# Patient Record
Sex: Male | Born: 1955 | Race: White | Hispanic: No | Marital: Married | Smoking: Never smoker
Health system: Southern US, Community
[De-identification: ages and names within clinical notes are randomized; demographics above are authoritative.]

## PROBLEM LIST (undated history)

## (undated) DIAGNOSIS — I639 Cerebral infarction, unspecified: Secondary | ICD-10-CM

## (undated) HISTORY — DX: Cerebral infarction, unspecified: I63.9

## (undated) HISTORY — PX: SKIN GRAFT: SHX250

---

## 2021-01-24 ENCOUNTER — Emergency Department (HOSPITAL_COMMUNITY): Payer: Medicare HMO | Admitting: Anesthesiology

## 2021-01-24 ENCOUNTER — Emergency Department (HOSPITAL_COMMUNITY): Payer: Medicare HMO

## 2021-01-24 ENCOUNTER — Inpatient Hospital Stay (HOSPITAL_COMMUNITY)
Admission: EM | Admit: 2021-01-24 | Discharge: 2021-02-15 | DRG: 023 | Disposition: A | Discharge status: Rehabilitation Facility | Payer: Medicare HMO | Attending: Internal Medicine | Admitting: Internal Medicine

## 2021-01-24 ENCOUNTER — Encounter (HOSPITAL_COMMUNITY)
Admission: EM | Disposition: A | Discharge status: Rehabilitation Facility | Payer: Self-pay | Source: Home / Self Care | Attending: Neurology

## 2021-01-24 DIAGNOSIS — J189 Pneumonia, unspecified organism: Secondary | ICD-10-CM | POA: Diagnosis not present

## 2021-01-24 DIAGNOSIS — R54 Age-related physical debility: Secondary | ICD-10-CM | POA: Diagnosis present

## 2021-01-24 DIAGNOSIS — Z781 Physical restraint status: Secondary | ICD-10-CM

## 2021-01-24 DIAGNOSIS — I6521 Occlusion and stenosis of right carotid artery: Secondary | ICD-10-CM | POA: Diagnosis present

## 2021-01-24 DIAGNOSIS — Y95 Nosocomial condition: Secondary | ICD-10-CM | POA: Diagnosis not present

## 2021-01-24 DIAGNOSIS — E785 Hyperlipidemia, unspecified: Secondary | ICD-10-CM | POA: Diagnosis present

## 2021-01-24 DIAGNOSIS — R410 Disorientation, unspecified: Secondary | ICD-10-CM | POA: Diagnosis not present

## 2021-01-24 DIAGNOSIS — I639 Cerebral infarction, unspecified: Secondary | ICD-10-CM | POA: Diagnosis present

## 2021-01-24 DIAGNOSIS — R1312 Dysphagia, oropharyngeal phase: Secondary | ICD-10-CM | POA: Diagnosis not present

## 2021-01-24 DIAGNOSIS — U071 COVID-19: Secondary | ICD-10-CM | POA: Diagnosis not present

## 2021-01-24 DIAGNOSIS — G8194 Hemiplegia, unspecified affecting left nondominant side: Secondary | ICD-10-CM | POA: Diagnosis present

## 2021-01-24 DIAGNOSIS — I69391 Dysphagia following cerebral infarction: Secondary | ICD-10-CM | POA: Diagnosis not present

## 2021-01-24 DIAGNOSIS — R4182 Altered mental status, unspecified: Secondary | ICD-10-CM | POA: Diagnosis not present

## 2021-01-24 DIAGNOSIS — J1282 Pneumonia due to coronavirus disease 2019: Secondary | ICD-10-CM | POA: Diagnosis not present

## 2021-01-24 DIAGNOSIS — E870 Hyperosmolality and hypernatremia: Secondary | ICD-10-CM | POA: Diagnosis not present

## 2021-01-24 DIAGNOSIS — D649 Anemia, unspecified: Secondary | ICD-10-CM | POA: Diagnosis not present

## 2021-01-24 DIAGNOSIS — R29718 NIHSS score 18: Secondary | ICD-10-CM | POA: Diagnosis present

## 2021-01-24 DIAGNOSIS — R0600 Dyspnea, unspecified: Secondary | ICD-10-CM

## 2021-01-24 DIAGNOSIS — D6489 Other specified anemias: Secondary | ICD-10-CM | POA: Diagnosis not present

## 2021-01-24 DIAGNOSIS — I517 Cardiomegaly: Secondary | ICD-10-CM | POA: Diagnosis not present

## 2021-01-24 DIAGNOSIS — R4701 Aphasia: Secondary | ICD-10-CM | POA: Diagnosis present

## 2021-01-24 DIAGNOSIS — R0602 Shortness of breath: Secondary | ICD-10-CM | POA: Diagnosis not present

## 2021-01-24 DIAGNOSIS — G936 Cerebral edema: Secondary | ICD-10-CM | POA: Diagnosis present

## 2021-01-24 DIAGNOSIS — D62 Acute posthemorrhagic anemia: Secondary | ICD-10-CM | POA: Diagnosis not present

## 2021-01-24 DIAGNOSIS — I629 Nontraumatic intracranial hemorrhage, unspecified: Secondary | ICD-10-CM | POA: Diagnosis not present

## 2021-01-24 DIAGNOSIS — R29810 Facial weakness: Secondary | ICD-10-CM | POA: Diagnosis present

## 2021-01-24 DIAGNOSIS — K117 Disturbances of salivary secretion: Secondary | ICD-10-CM | POA: Diagnosis not present

## 2021-01-24 DIAGNOSIS — I618 Other nontraumatic intracerebral hemorrhage: Secondary | ICD-10-CM | POA: Diagnosis not present

## 2021-01-24 DIAGNOSIS — J690 Pneumonitis due to inhalation of food and vomit: Secondary | ICD-10-CM | POA: Diagnosis not present

## 2021-01-24 DIAGNOSIS — J9601 Acute respiratory failure with hypoxia: Secondary | ICD-10-CM | POA: Diagnosis not present

## 2021-01-24 DIAGNOSIS — R414 Neurologic neglect syndrome: Secondary | ICD-10-CM | POA: Diagnosis present

## 2021-01-24 DIAGNOSIS — D61818 Other pancytopenia: Secondary | ICD-10-CM | POA: Diagnosis not present

## 2021-01-24 DIAGNOSIS — I6601 Occlusion and stenosis of right middle cerebral artery: Secondary | ICD-10-CM | POA: Diagnosis present

## 2021-01-24 DIAGNOSIS — R739 Hyperglycemia, unspecified: Secondary | ICD-10-CM | POA: Diagnosis not present

## 2021-01-24 DIAGNOSIS — G9341 Metabolic encephalopathy: Secondary | ICD-10-CM | POA: Diagnosis present

## 2021-01-24 DIAGNOSIS — D72829 Elevated white blood cell count, unspecified: Secondary | ICD-10-CM | POA: Diagnosis not present

## 2021-01-24 DIAGNOSIS — I619 Nontraumatic intracerebral hemorrhage, unspecified: Secondary | ICD-10-CM | POA: Diagnosis present

## 2021-01-24 DIAGNOSIS — J3489 Other specified disorders of nose and nasal sinuses: Secondary | ICD-10-CM | POA: Diagnosis not present

## 2021-01-24 DIAGNOSIS — I6389 Other cerebral infarction: Secondary | ICD-10-CM | POA: Diagnosis not present

## 2021-01-24 DIAGNOSIS — I6300 Cerebral infarction due to thrombosis of unspecified precerebral artery: Secondary | ICD-10-CM | POA: Diagnosis not present

## 2021-01-24 DIAGNOSIS — R404 Transient alteration of awareness: Secondary | ICD-10-CM | POA: Diagnosis not present

## 2021-01-24 DIAGNOSIS — J988 Other specified respiratory disorders: Secondary | ICD-10-CM | POA: Diagnosis not present

## 2021-01-24 DIAGNOSIS — E871 Hypo-osmolality and hyponatremia: Secondary | ICD-10-CM | POA: Diagnosis not present

## 2021-01-24 DIAGNOSIS — I63411 Cerebral infarction due to embolism of right middle cerebral artery: Secondary | ICD-10-CM | POA: Diagnosis present

## 2021-01-24 DIAGNOSIS — R001 Bradycardia, unspecified: Secondary | ICD-10-CM | POA: Diagnosis not present

## 2021-01-24 DIAGNOSIS — I959 Hypotension, unspecified: Secondary | ICD-10-CM | POA: Diagnosis not present

## 2021-01-24 DIAGNOSIS — I69392 Facial weakness following cerebral infarction: Secondary | ICD-10-CM | POA: Diagnosis not present

## 2021-01-24 DIAGNOSIS — R0682 Tachypnea, not elsewhere classified: Secondary | ICD-10-CM

## 2021-01-24 DIAGNOSIS — R4020 Unspecified coma: Secondary | ICD-10-CM | POA: Diagnosis not present

## 2021-01-24 DIAGNOSIS — Z28310 Unvaccinated for covid-19: Secondary | ICD-10-CM | POA: Diagnosis not present

## 2021-01-24 DIAGNOSIS — Z79899 Other long term (current) drug therapy: Secondary | ICD-10-CM

## 2021-01-24 DIAGNOSIS — R4781 Slurred speech: Secondary | ICD-10-CM | POA: Diagnosis not present

## 2021-01-24 DIAGNOSIS — Z9289 Personal history of other medical treatment: Secondary | ICD-10-CM

## 2021-01-24 DIAGNOSIS — I63031 Cerebral infarction due to thrombosis of right carotid artery: Secondary | ICD-10-CM | POA: Diagnosis not present

## 2021-01-24 DIAGNOSIS — I63231 Cerebral infarction due to unspecified occlusion or stenosis of right carotid arteries: Secondary | ICD-10-CM | POA: Diagnosis not present

## 2021-01-24 DIAGNOSIS — R7303 Prediabetes: Secondary | ICD-10-CM | POA: Diagnosis present

## 2021-01-24 DIAGNOSIS — J323 Chronic sphenoidal sinusitis: Secondary | ICD-10-CM | POA: Diagnosis not present

## 2021-01-24 DIAGNOSIS — Z4682 Encounter for fitting and adjustment of non-vascular catheter: Secondary | ICD-10-CM | POA: Diagnosis not present

## 2021-01-24 DIAGNOSIS — I161 Hypertensive emergency: Secondary | ICD-10-CM | POA: Diagnosis present

## 2021-01-24 DIAGNOSIS — J9811 Atelectasis: Secondary | ICD-10-CM | POA: Diagnosis not present

## 2021-01-24 DIAGNOSIS — I10 Essential (primary) hypertension: Secondary | ICD-10-CM | POA: Diagnosis present

## 2021-01-24 DIAGNOSIS — Z9889 Other specified postprocedural states: Secondary | ICD-10-CM | POA: Diagnosis not present

## 2021-01-24 DIAGNOSIS — R509 Fever, unspecified: Secondary | ICD-10-CM | POA: Diagnosis not present

## 2021-01-24 DIAGNOSIS — I69354 Hemiplegia and hemiparesis following cerebral infarction affecting left non-dominant side: Secondary | ICD-10-CM | POA: Diagnosis not present

## 2021-01-24 DIAGNOSIS — I63511 Cerebral infarction due to unspecified occlusion or stenosis of right middle cerebral artery: Secondary | ICD-10-CM | POA: Diagnosis not present

## 2021-01-24 DIAGNOSIS — R339 Retention of urine, unspecified: Secondary | ICD-10-CM | POA: Diagnosis not present

## 2021-01-24 DIAGNOSIS — I69322 Dysarthria following cerebral infarction: Secondary | ICD-10-CM | POA: Diagnosis not present

## 2021-01-24 DIAGNOSIS — Z8616 Personal history of COVID-19: Secondary | ICD-10-CM | POA: Diagnosis not present

## 2021-01-24 DIAGNOSIS — H518 Other specified disorders of binocular movement: Secondary | ICD-10-CM | POA: Diagnosis present

## 2021-01-24 DIAGNOSIS — E876 Hypokalemia: Secondary | ICD-10-CM | POA: Diagnosis not present

## 2021-01-24 DIAGNOSIS — R0689 Other abnormalities of breathing: Secondary | ICD-10-CM | POA: Diagnosis not present

## 2021-01-24 DIAGNOSIS — Z4659 Encounter for fitting and adjustment of other gastrointestinal appliance and device: Secondary | ICD-10-CM

## 2021-01-24 DIAGNOSIS — R220 Localized swelling, mass and lump, head: Secondary | ICD-10-CM | POA: Diagnosis not present

## 2021-01-24 DIAGNOSIS — R4189 Other symptoms and signs involving cognitive functions and awareness: Secondary | ICD-10-CM | POA: Diagnosis present

## 2021-01-24 DIAGNOSIS — R471 Dysarthria and anarthria: Secondary | ICD-10-CM | POA: Diagnosis present

## 2021-01-24 HISTORY — PX: RADIOLOGY WITH ANESTHESIA: SHX6223

## 2021-01-24 LAB — CBC
HCT: 50.2 % (ref 39.0–52.0)
Hemoglobin: 16.9 g/dL (ref 13.0–17.0)
MCH: 30.2 pg (ref 26.0–34.0)
MCHC: 33.7 g/dL (ref 30.0–36.0)
MCV: 89.8 fL (ref 80.0–100.0)
Platelets: 251 10*3/uL (ref 150–400)
RBC: 5.59 MIL/uL (ref 4.22–5.81)
RDW: 12.3 % (ref 11.5–15.5)
WBC: 9.7 10*3/uL (ref 4.0–10.5)
nRBC: 0 % (ref 0.0–0.2)

## 2021-01-24 LAB — COMPREHENSIVE METABOLIC PANEL
ALT: 31 U/L (ref 0–44)
AST: 27 U/L (ref 15–41)
Albumin: 4.6 g/dL (ref 3.5–5.0)
Alkaline Phosphatase: 79 U/L (ref 38–126)
Anion gap: 13 (ref 5–15)
BUN: 9 mg/dL (ref 8–23)
CO2: 23 mmol/L (ref 22–32)
Calcium: 9.1 mg/dL (ref 8.9–10.3)
Chloride: 95 mmol/L — ABNORMAL LOW (ref 98–111)
Creatinine, Ser: 0.74 mg/dL (ref 0.61–1.24)
GFR, Estimated: >60 mL/min (ref >60)
Glucose, Bld: 103 mg/dL — ABNORMAL HIGH (ref 70–99)
Potassium: 4 mmol/L (ref 3.5–5.1)
Sodium: 131 mmol/L — ABNORMAL LOW (ref 135–145)
Total Bilirubin: 1.1 mg/dL (ref 0.3–1.2)
Total Protein: 7.8 g/dL (ref 6.5–8.1)

## 2021-01-24 LAB — DIFFERENTIAL
Abs Immature Granulocytes: 0.03 10*3/uL (ref 0.00–0.07)
Basophils Absolute: 0 10*3/uL (ref 0.0–0.1)
Basophils Relative: 0 %
Eosinophils Absolute: 0.1 10*3/uL (ref 0.0–0.5)
Eosinophils Relative: 1 %
Immature Granulocytes: 0 %
Lymphocytes Relative: 13 %
Lymphs Abs: 1.3 10*3/uL (ref 0.7–4.0)
Monocytes Absolute: 0.8 10*3/uL (ref 0.1–1.0)
Monocytes Relative: 8 %
Neutro Abs: 7.5 10*3/uL (ref 1.7–7.7)
Neutrophils Relative %: 78 %

## 2021-01-24 LAB — I-STAT CHEM 8, ED
BUN: 11 mg/dL (ref 8–23)
Calcium, Ion: 0.89 mmol/L — CL (ref 1.15–1.40)
Chloride: 100 mmol/L (ref 98–111)
Creatinine, Ser: 0.7 mg/dL (ref 0.61–1.24)
Glucose, Bld: 101 mg/dL — ABNORMAL HIGH (ref 70–99)
HCT: 49 % (ref 39.0–52.0)
Hemoglobin: 16.7 g/dL (ref 13.0–17.0)
Potassium: 5 mmol/L (ref 3.5–5.1)
Sodium: 132 mmol/L — ABNORMAL LOW (ref 135–145)
TCO2: 26 mmol/L (ref 22–32)

## 2021-01-24 LAB — CBG MONITORING, ED: Glucose-Capillary: 71 mg/dL (ref 70–99)

## 2021-01-24 LAB — APTT: aPTT: 32 seconds (ref 24–36)

## 2021-01-24 LAB — PROTIME-INR
INR: 1 (ref 0.8–1.2)
Prothrombin Time: 13.1 seconds (ref 11.4–15.2)

## 2021-01-24 SURGERY — IR WITH ANESTHESIA
Anesthesia: General

## 2021-01-24 MED ORDER — CEFAZOLIN SODIUM-DEXTROSE 2-4 GM/100ML-% IV SOLN
INTRAVENOUS | Status: AC
  Filled 2021-01-24: qty 100

## 2021-01-24 MED ORDER — CANGRELOR TETRASODIUM 50 MG IV SOLR
INTRAVENOUS | Status: AC
  Filled 2021-01-24: qty 50

## 2021-01-24 MED ORDER — FENTANYL CITRATE (PF) 250 MCG/5ML IJ SOLN
INTRAMUSCULAR | Status: AC
  Administered 2021-01-25: 25 ug via INTRAVENOUS
  Filled 2021-01-24: qty 5

## 2021-01-24 MED ORDER — ALTEPLASE (STROKE) FULL DOSE INFUSION
0.9000 mg/kg | Freq: Once | INTRAVENOUS | Status: AC
  Administered 2021-01-24: 66.9 mg via INTRAVENOUS
  Filled 2021-01-24: qty 100

## 2021-01-24 MED ORDER — LABETALOL HCL 5 MG/ML IV SOLN
10.0000 mg | Freq: Once | INTRAVENOUS | Status: AC
  Administered 2021-01-24: 10 mg via INTRAVENOUS

## 2021-01-24 MED ORDER — CLEVIDIPINE BUTYRATE 0.5 MG/ML IV EMUL
INTRAVENOUS | Status: AC
  Filled 2021-01-24: qty 50

## 2021-01-24 MED ORDER — NITROGLYCERIN 1 MG/10 ML FOR IR/CATH LAB
INTRA_ARTERIAL | Status: AC
  Administered 2021-01-25: 50 ug via INTRA_ARTERIAL
  Filled 2021-01-24: qty 10

## 2021-01-24 MED ORDER — IOHEXOL 350 MG/ML SOLN
100.0000 mL | Freq: Once | INTRAVENOUS | Status: AC | PRN
  Administered 2021-01-24: 100 mL via INTRAVENOUS

## 2021-01-24 MED ORDER — TICAGRELOR 90 MG PO TABS
ORAL_TABLET | ORAL | Status: AC
  Administered 2021-01-25: 180 mg via OROGASTRIC
  Filled 2021-01-24: qty 2

## 2021-01-24 MED ORDER — SODIUM CHLORIDE 0.9 % IV SOLN: 50.0000 mL | Freq: Once | INTRAVENOUS | Status: DC

## 2021-01-24 MED ORDER — CLOPIDOGREL BISULFATE 300 MG PO TABS
ORAL_TABLET | ORAL | Status: AC
  Filled 2021-01-24: qty 1

## 2021-01-24 MED ORDER — VERAPAMIL HCL 2.5 MG/ML IV SOLN
INTRAVENOUS | Status: AC
  Filled 2021-01-24: qty 2

## 2021-01-24 MED ORDER — ASPIRIN 81 MG PO CHEW
CHEWABLE_TABLET | ORAL | Status: AC
  Administered 2021-01-25: 81 mg via OROGASTRIC
  Filled 2021-01-24: qty 1

## 2021-01-24 MED ORDER — EPTIFIBATIDE 20 MG/10ML IV SOLN
INTRAVENOUS | Status: AC
  Administered 2021-01-25: 2 mg via INTRA_ARTERIAL
  Administered 2021-01-25: 4 mg via INTRA_ARTERIAL
  Filled 2021-01-24: qty 10

## 2021-01-24 MED ORDER — TIROFIBAN HCL IN NACL 5-0.9 MG/100ML-% IV SOLN
INTRAVENOUS | Status: AC
  Filled 2021-01-24: qty 100

## 2021-01-24 MED ORDER — CLEVIDIPINE BUTYRATE 0.5 MG/ML IV EMUL
0.0000 mg/h | INTRAVENOUS | Status: DC
  Administered 2021-01-24: 4 mg/h via INTRAVENOUS
  Administered 2021-01-25: 30 mg/h via INTRAVENOUS
  Administered 2021-01-25: 12 mg/h via INTRAVENOUS
  Administered 2021-01-25: 23 mg/h via INTRAVENOUS
  Administered 2021-01-25 (×2): 30 mg/h via INTRAVENOUS
  Administered 2021-01-26: 4 mg/h via INTRAVENOUS
  Filled 2021-01-24 (×7): qty 100

## 2021-01-24 MED ORDER — SODIUM CHLORIDE 0.9% FLUSH: 3.0000 mL | Freq: Once | INTRAVENOUS | Status: DC

## 2021-01-24 MED ORDER — IOHEXOL 240 MG/ML SOLN
INTRAMUSCULAR | Status: AC
  Filled 2021-01-24: qty 200

## 2021-01-24 NOTE — Progress Notes (Signed)
PHARMACIST CODE STROKE RESPONSE  Notified to mix tPA at 1047 by Dr. Otelia Limes Delivered tPA to RN at 1050  tPA dose = 7.4mg  bolus over 1 minute followed by 66.9mg  for a total dose of 74.3mg  over 1 hour  Issues/delays encountered (if applicable): BP management, required multiple labetalol doses and cleviprex gtt   Daylene Posey 01/24/21 11:07 PM

## 2021-01-24 NOTE — Code Documentation (Signed)
Stroke Response Nurse Documentation Code Documentation  Leonard [name] Van is a 65 y.o. male arriving to [facility] ED via [place] [facility] on 4/20 with no past medical hx. Code stroke was activated by EMS. Patient from home where he was LKW at 1830 and now complaining of left hemiplegia and left facial droop . On No antithrombotic. Stroke team at the bedside on patient arrival. Labs drawn and patient cleared for CT by Dr. Silverio Lay. Patient to CT with team. NIHSS 18, see documentation for details and code stroke times. Patient with right gaze preference , left hemianopia, left facial droop, left arm weakness, left leg weakness, left decreased sensation, dysarthria  and Sensory  neglect on exam. The following imaging was completed:  CTA head and neck and CTP.  Patient is a candidate for tPA due to fixed neurological deficit. Pt was taken to IR for revasularization. Bedside handoff with ED RN Renea Ee.    Rose Fillers  Rapid Response RN

## 2021-01-24 NOTE — ED Notes (Signed)
Transported to IR 

## 2021-01-24 NOTE — ED Notes (Signed)
Transported to CT 

## 2021-01-24 NOTE — Anesthesia Preprocedure Evaluation (Addendum)
Anesthesia Evaluation  Patient identified by MRN, date of birth, ID band Patient confused    Reviewed: Allergy & Precautions, Patient's Chart, lab work & pertinent test results, Unable to perform ROS - Chart review onlyPreop documentation limited or incomplete due to emergent nature of procedure.  Airway Mallampati: III       Dental no notable dental hx.    Pulmonary    Pulmonary exam normal        Cardiovascular Normal cardiovascular exam     Neuro/Psych CVA    GI/Hepatic negative GI ROS,   Endo/Other    Renal/GU      Musculoskeletal   Abdominal   Peds  Hematology   Anesthesia Other Findings   Reproductive/Obstetrics                           Anesthesia Physical Anesthesia Plan  ASA: III and emergent  Anesthesia Plan: General   Post-op Pain Management:    Induction: Intravenous, Rapid sequence and Cricoid pressure planned  PONV Risk Score and Plan: 2 and Ondansetron and Treatment Gonyer vary due to age or medical condition  Airway Management Planned: Oral ETT  Additional Equipment:   Intra-op Plan:   Post-operative Plan: Possible Post-op intubation/ventilation  Informed Consent: I have reviewed the patients History and Physical, chart, labs and discussed the procedure including the risks, benefits and alternatives for the proposed anesthesia with the patient or authorized representative who has indicated his/her understanding and acceptance.       Plan Discussed with: Anesthesiologist, CRNA and Surgeon  Anesthesia Plan Comments:        Anesthesia Quick Evaluation

## 2021-01-24 NOTE — H&P (Signed)
Admission H&P    Chief Complaint: Acute onset of left hemiplegia, left facial droop, dysarthria and left hemineglect  HPI: Leonard [name] is an 65 y.o. male with no PMHx who presents via EMS as a Code Stroke for acute onset of left hemiplegia, left facial droop, dysarthria and left hemineglect. He was last seen normal by family at 6:30 PM when he went to go sit on a bench at a pond on his property. When family went to check on him at about 9:30 PM, he was slumped over to his left side, weak on the left, with left facial droop and dysarthria. EMS was called and on arrival they noted the same. Code Stroke was called en route. BP per EMS was 232/124. STAT CT head in the ED shows no hemorrhage, but with subtle hypodensity in portions of the right MCA territory as well as a dense right MCA sign. The patient is unaware of his deficits but knows that he is in the hospital.   CT head: 1. 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 2. Hyperdense appearance of the right MCA. 3. ASPECTS is 7.  LSN: 6:30 PM tPA Given: Yes NIHSS: 18 mRS: 0  PMHx Per family communication with EMS, the patient has no significant PMHx  No family history on file.  Social History:  has no history on file for tobacco use, alcohol use, and drug use.  Allergies: Not on File  Medications: No home medications listed in Epic  ROS: The patient has no complaints, including no headache. He is unaware of his left sided motor deficit.   Physical Examination: Weight 82.6 kg.  HEENT-  [place]/AT Lungs - Respirations unlabored Extremities - Warm and well perfused  Neurologic Examination: Mental Status: Awake with decreased level of alertness. Speech is mildly dysarthric but fluent, with intact comprehension for right sided commands. Naming intact. He is oriented to the day of the week, the month and the state. He knows that he is from [place] but does not know the current city. He states that it is 2021.  Anosognosia and left hemineglect are noted.  Cranial Nerves: II:  Left homonymous hemianopsia. PERRL.  III,IV, VI: No ptosis. Right gaze deviation. Cannot cross midline to the left. No nystagmus.   V,VII: Left facial droop. Insensate to FT on the left.  VIII: Hearing intact to voice IX,X: Mild pharyngeal dysarthria XI: Head rotated to the right XII: Tongue deviates to the left.  Motor: RUE and RLE 5/5 LUE 0/5 LLE 3/5 Sensory: Insensate to LUE and LLE Deep Tendon Reflexes:  2+ bilateral brachioradialis.  3+ patellar reflexes bilaterally.  Plantars: Mute bilaterally  Cerebellar: No ataxia with right FNF. Unable to perform with LUE.  Gait: Unable to assess.   Results for orders placed or performed during the hospital encounter of 01/24/21 (from the past 48 hour(s))  CBC     Status: None   Collection Time: 01/24/21 10:35 PM  Result Value Ref Range   WBC 9.7 4.0 - 10.5 K/uL   RBC 5.59 4.22 - 5.81 MIL/uL   Hemoglobin 16.9 13.0 - 17.0 g/dL   HCT 38.7 56.4 - 33.2 %   MCV 89.8 80.0 - 100.0 fL   MCH 30.2 26.0 - 34.0 pg   MCHC 33.7 30.0 - 36.0 g/dL   RDW 95.1 88.4 - 16.6 %   Platelets 251 150 - 400 K/uL   nRBC 0.0 0.0 - 0.2 %    Comment: Performed at [facility]  [facility], [address] [place], [place] [address]  Differential     Status: None   Collection Time: 01/24/21 10:35 PM  Result Value Ref Range   Neutrophils Relative % 78 %   Neutro Abs 7.5 1.7 - 7.7 K/uL   Lymphocytes Relative 13 %   Lymphs Abs 1.3 0.7 - 4.0 K/uL   Monocytes Relative 8 %   Monocytes Absolute 0.8 0.1 - 1.0 K/uL   Eosinophils Relative 1 %   Eosinophils Absolute 0.1 0.0 - 0.5 K/uL   Basophils Relative 0 %   Basophils Absolute 0.0 0.0 - 0.1 K/uL   Immature Granulocytes 0 %   Abs Immature Granulocytes 0.03 0.00 - 0.07 K/uL    Comment: Performed at [facility], [address], [place], [place] [address]  CBG monitoring, ED     Status: None   Collection Time: 01/24/21 10:37 PM  Result Value Ref  Range   Glucose-Capillary 71 70 - 99 mg/dL    Comment: Glucose reference range applies only to samples taken after fasting for at least 8 hours.  I-stat chem 8, ED     Status: Abnormal   Collection Time: 01/24/21 10:45 PM  Result Value Ref Range   Sodium 132 (L) 135 - 145 mmol/L   Potassium 5.0 3.5 - 5.1 mmol/L   Chloride 100 98 - 111 mmol/L   BUN 11 8 - 23 mg/dL   Creatinine, Ser 7.78 0.61 - 1.24 mg/dL   Glucose, Bld 242 (H) 70 - 99 mg/dL    Comment: Glucose reference range applies only to samples taken after fasting for at least 8 hours.   Calcium, Ion 0.89 (LL) 1.15 - 1.40 mmol/L   TCO2 26 22 - 32 mmol/L   Hemoglobin 16.7 13.0 - 17.0 g/dL   HCT 35.3 61.4 - 43.1 %   Comment NOTIFIED PHYSICIAN    No results found.  Assessment: 65 y.o. male presenting with acute onset of left hemiplegia, left facial droop, dysarthria and left hemineglect 1. 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 Hyperdense appearance of the right MCA. ASPECTS is 7. 3. 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 68 mL right MCA territory core infarct with 133 mL area of ischemic penumbra. 4. Stroke Risk Factors - None based on no PMHx 5. After comprehensive review of possible contraindications, he has no absolute contraindications to tPA administration. 6. The patient is a tPA candidate. Discussed extensively the risks/benefits of tPA treatment vs. no treatment with the patient's wife, including risks of hemorrhage and death with tPA administration versus worse overall outcomes on average in patients within tPA time window who are not administered tPA. The patient's anosognosia precludes meaningful medical decision making on his  part at this time. Overall benefits of tPA regarding long-term prognosis are felt to outweigh risks. The patient's wife expressed understanding and wish to proceed with tPA 7. The patient is a VIR candidate. Risks/benefits of the procedure were discussed extensively with the patient's wife, including approximately 50% chance of significant improvement relative to an approximate 10% chance of subarachnoid hemorrhage with possibility of significant worsening including death. The patient's wife expressed understanding and provided informed consent to proceed with VIR.  Plan: 1. Admitting to Neuro ICU.  2. Post-tPA order set to include frequent neuro checks and BP management.  3. No antiplatelet medications or anticoagulants for at least 24 hours following tPA.  4. DVT prophylaxis with SCDs.  5. Will need to be started on a statin.  6. Will need to start antiplatelet therapy if follow up CT at 24 hours is negative for hemorrhagic conversion. 7. TTE.  8. MRI brain 9. Cardiac telemetry  10. PT/OT/Speech.  11. NPO until passes swallow evaluation.  12. Fasting lipid panel, HgbA1c   45 minutes spent in the emergent neurological evaluation and management of this critically ill patient with acute stroke.   Electronically signed: Dr. Caryl Pina 01/24/2021, 10:54 PM

## 2021-01-24 NOTE — ED Provider Notes (Signed)
[facility] EMERGENCY DEPARTMENT Provider Note   CSN: [id number] Arrival date & time: 01/24/21  2233     History No chief complaint on file.   Adger Cantera Lagos is a 65 y.o. male here presenting with weakness.  Patient's last normal was 6:30 PM.  He was with family and told his wife that he needs to go down to the pond because he was afraid that the beaver was building a dam so he wants to go to shoot the beaver.  Wife went to check up on him around 10:45 PM.  However he appears to be slumped over and she noticed a left-sided weakness and left facial droop.  EMS was called and code stroke was activated.  Per family, patient has no medical problems but has not seen doctors for years.   The history is provided by the patient.       No past medical history on file.  There are no problems to display for this patient.  No family history on file.     Home Medications Prior to Admission medications   Not on File    Allergies    Patient has no allergy information on record.  Review of Systems   Review of Systems  Neurological: Positive for weakness.  All other systems reviewed and are negative.   Physical Exam Updated Vital Signs Wt 82.6 kg   Physical Exam Vitals and nursing note reviewed.  Constitutional:      Appearance: Normal appearance.  HENT:     Head: Normocephalic.     Nose: Nose normal.     Mouth/Throat:     Mouth: Mucous membranes are moist.  Eyes:     Extraocular Movements: Extraocular movements intact.     Pupils: Pupils are equal, round, and reactive to light.  Cardiovascular:     Rate and Rhythm: Normal rate and regular rhythm.     Pulses: Normal pulses.     Heart sounds: Normal heart sounds.  Pulmonary:     Effort: Pulmonary effort is normal.     Breath sounds: Normal breath sounds.  Abdominal:     General: Abdomen is flat.     Palpations: Abdomen is soft.  Musculoskeletal:        General: Normal range of motion.     Cervical  back: Normal range of motion and neck supple.  Skin:    General: Skin is warm.     Capillary Refill: Capillary refill takes less than 2 seconds.  Neurological:     Mental Status: He is alert.     Comments: + L facial droop. Strength 4/5 L arm and leg. R gaze preference   Psychiatric:        Mood and Affect: Mood normal.     ED Results / Procedures / Treatments   Labs (all labs ordered are listed, but only abnormal results are displayed) Labs Reviewed  I-STAT CHEM 8, ED - Abnormal; Notable for the following components:      Result Value   Sodium 132 (*)    Glucose, Bld 101 (*)    Calcium, Ion 0.89 (*)    All other components within normal limits  RESP PANEL BY RT-PCR (FLU A&B, COVID) ARPGX2  PROTIME-INR  APTT  CBC  DIFFERENTIAL  COMPREHENSIVE METABOLIC PANEL  CBG MONITORING, ED    EKG None  Radiology CT HEAD CODE STROKE WO CONTRAST  Result Date: 01/24/2021 CLINICAL DATA:  Code stroke.  Slurred speech and left-sided deficits EXAM:  CT HEAD WITHOUT CONTRAST TECHNIQUE: Contiguous axial images were obtained from the base of the skull through the vertex without intravenous contrast. COMPARISON:  None. FINDINGS: Brain: There is no mass, hemorrhage or extra-axial collection. The size and configuration of the ventricles and extra-axial CSF spaces are normal. There is loss of gray-white differentiation within the right insula and mid MCA territory. Vascular: Hyperdense appearance of the right MCA. Skull: The visualized skull base, calvarium and extracranial soft tissues are normal. Sinuses/Orbits: No fluid levels or advanced mucosal thickening of the visualized paranasal sinuses. No mastoid or middle ear effusion. The orbits are normal. ASPECTS [facility] Stroke Program Early CT Score) - Ganglionic level infarction (caudate, lentiform nuclei, internal capsule, insula, M1-M3 cortex): 5 - Supraganglionic infarction (M4-M6 cortex): 2 Total score (0-10 with 10 being normal): 7 IMPRESSION: 1.  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 2. Hyperdense appearance of the right MCA. 3. ASPECTS is 7. These results were called by telephone at the time of interpretation on 01/24/2021 at 10:58 pm to provider Caryl Pina, who verbally acknowledged these results. Electronically Signed   By: Deatra Robinson M.D.   On: 01/24/2021 22:58    Procedures Procedures   CRITICAL CARE Performed by: Richardean Canal   Total critical care time: 30  minutes  Critical care time was exclusive of separately billable procedures and treating other patients.  Critical care was necessary to treat or prevent imminent or life-threatening deterioration.  Critical care was time spent personally by me on the following activities: development of treatment plan with patient and/or surrogate as well as nursing, discussions with consultants, evaluation of patient's response to treatment, examination of patient, obtaining history from patient or surrogate, ordering and performing treatments and interventions, ordering and review of laboratory studies, ordering and review of radiographic studies, pulse oximetry and re-evaluation of patient's condition.   Medications Ordered in ED Medications  sodium chloride flush (NS) 0.9 % injection 3 mL (has no administration in time range)  alteplase (ACTIVASE) 1 mg/mL infusion 74.3 mg (has no administration in time range)    Followed by  0.9 %  sodium chloride infusion (has no administration in time range)  fentaNYL citrate (PF) (SUBLIMAZE) 250 MCG/5ML injection (has no administration in time range)    ED Course  I have reviewed the triage vital signs and the nursing notes.  Pertinent labs & imaging results that were available during my care of the patient were reviewed by me and considered in my medical decision making (see chart for details).    MDM Rules/Calculators/A&P                         Elic Vencill Garcilazo is a 66 y.o. male who presented with weakness.   Patient came in as a code stroke.  Patient has obvious right gaze preference and left-sided facial droop and left-sided weakness.  Patient was seen by neurology on arrival.  tPA was ordered by neurology.  CT angio is pending and neurology will admit to neuro ICU.   Final Clinical Impression(s) / ED Diagnoses Final diagnoses:  Cerebrovascular accident (CVA), unspecified mechanism [facility])    Rx / DC Orders ED Discharge Orders    None       Charlynne Pander, MD 01/24/21 2308

## 2021-01-25 ENCOUNTER — Inpatient Hospital Stay (HOSPITAL_COMMUNITY): Payer: Medicare HMO

## 2021-01-25 ENCOUNTER — Encounter (HOSPITAL_COMMUNITY): Payer: Self-pay | Admitting: Neurology

## 2021-01-25 DIAGNOSIS — E785 Hyperlipidemia, unspecified: Secondary | ICD-10-CM | POA: Diagnosis present

## 2021-01-25 DIAGNOSIS — I6521 Occlusion and stenosis of right carotid artery: Secondary | ICD-10-CM | POA: Diagnosis present

## 2021-01-25 DIAGNOSIS — R220 Localized swelling, mass and lump, head: Secondary | ICD-10-CM | POA: Diagnosis not present

## 2021-01-25 DIAGNOSIS — U071 COVID-19: Secondary | ICD-10-CM | POA: Diagnosis not present

## 2021-01-25 DIAGNOSIS — I639 Cerebral infarction, unspecified: Secondary | ICD-10-CM | POA: Diagnosis present

## 2021-01-25 DIAGNOSIS — J323 Chronic sphenoidal sinusitis: Secondary | ICD-10-CM | POA: Diagnosis not present

## 2021-01-25 DIAGNOSIS — E870 Hyperosmolality and hypernatremia: Secondary | ICD-10-CM | POA: Diagnosis not present

## 2021-01-25 DIAGNOSIS — R414 Neurologic neglect syndrome: Secondary | ICD-10-CM | POA: Diagnosis present

## 2021-01-25 DIAGNOSIS — I618 Other nontraumatic intracerebral hemorrhage: Secondary | ICD-10-CM | POA: Diagnosis not present

## 2021-01-25 DIAGNOSIS — R4701 Aphasia: Secondary | ICD-10-CM | POA: Diagnosis present

## 2021-01-25 DIAGNOSIS — E871 Hypo-osmolality and hyponatremia: Secondary | ICD-10-CM | POA: Diagnosis not present

## 2021-01-25 DIAGNOSIS — I63031 Cerebral infarction due to thrombosis of right carotid artery: Secondary | ICD-10-CM | POA: Diagnosis not present

## 2021-01-25 DIAGNOSIS — I6601 Occlusion and stenosis of right middle cerebral artery: Secondary | ICD-10-CM | POA: Diagnosis not present

## 2021-01-25 DIAGNOSIS — J189 Pneumonia, unspecified organism: Secondary | ICD-10-CM | POA: Diagnosis not present

## 2021-01-25 DIAGNOSIS — I6389 Other cerebral infarction: Secondary | ICD-10-CM | POA: Diagnosis not present

## 2021-01-25 DIAGNOSIS — G9341 Metabolic encephalopathy: Secondary | ICD-10-CM | POA: Diagnosis present

## 2021-01-25 DIAGNOSIS — I161 Hypertensive emergency: Secondary | ICD-10-CM | POA: Diagnosis not present

## 2021-01-25 DIAGNOSIS — I63411 Cerebral infarction due to embolism of right middle cerebral artery: Secondary | ICD-10-CM | POA: Diagnosis present

## 2021-01-25 DIAGNOSIS — I619 Nontraumatic intracerebral hemorrhage, unspecified: Secondary | ICD-10-CM | POA: Diagnosis not present

## 2021-01-25 DIAGNOSIS — Y95 Nosocomial condition: Secondary | ICD-10-CM | POA: Diagnosis not present

## 2021-01-25 DIAGNOSIS — J9811 Atelectasis: Secondary | ICD-10-CM | POA: Diagnosis not present

## 2021-01-25 DIAGNOSIS — I6300 Cerebral infarction due to thrombosis of unspecified precerebral artery: Secondary | ICD-10-CM | POA: Diagnosis not present

## 2021-01-25 DIAGNOSIS — R4182 Altered mental status, unspecified: Secondary | ICD-10-CM | POA: Diagnosis not present

## 2021-01-25 DIAGNOSIS — I63231 Cerebral infarction due to unspecified occlusion or stenosis of right carotid arteries: Secondary | ICD-10-CM | POA: Diagnosis not present

## 2021-01-25 DIAGNOSIS — I69391 Dysphagia following cerebral infarction: Secondary | ICD-10-CM | POA: Diagnosis not present

## 2021-01-25 DIAGNOSIS — I10 Essential (primary) hypertension: Secondary | ICD-10-CM | POA: Diagnosis not present

## 2021-01-25 DIAGNOSIS — Z4682 Encounter for fitting and adjustment of non-vascular catheter: Secondary | ICD-10-CM | POA: Diagnosis not present

## 2021-01-25 DIAGNOSIS — R29810 Facial weakness: Secondary | ICD-10-CM | POA: Diagnosis present

## 2021-01-25 DIAGNOSIS — G936 Cerebral edema: Secondary | ICD-10-CM | POA: Diagnosis not present

## 2021-01-25 DIAGNOSIS — I959 Hypotension, unspecified: Secondary | ICD-10-CM | POA: Diagnosis not present

## 2021-01-25 DIAGNOSIS — R509 Fever, unspecified: Secondary | ICD-10-CM | POA: Diagnosis not present

## 2021-01-25 DIAGNOSIS — Z9889 Other specified postprocedural states: Secondary | ICD-10-CM | POA: Diagnosis not present

## 2021-01-25 DIAGNOSIS — J690 Pneumonitis due to inhalation of food and vomit: Secondary | ICD-10-CM | POA: Diagnosis not present

## 2021-01-25 DIAGNOSIS — R0602 Shortness of breath: Secondary | ICD-10-CM | POA: Diagnosis not present

## 2021-01-25 DIAGNOSIS — D62 Acute posthemorrhagic anemia: Secondary | ICD-10-CM | POA: Diagnosis not present

## 2021-01-25 DIAGNOSIS — J988 Other specified respiratory disorders: Secondary | ICD-10-CM | POA: Diagnosis not present

## 2021-01-25 DIAGNOSIS — R0682 Tachypnea, not elsewhere classified: Secondary | ICD-10-CM | POA: Diagnosis not present

## 2021-01-25 DIAGNOSIS — R54 Age-related physical debility: Secondary | ICD-10-CM | POA: Diagnosis present

## 2021-01-25 DIAGNOSIS — R29718 NIHSS score 18: Secondary | ICD-10-CM | POA: Diagnosis present

## 2021-01-25 DIAGNOSIS — D72829 Elevated white blood cell count, unspecified: Secondary | ICD-10-CM | POA: Diagnosis not present

## 2021-01-25 DIAGNOSIS — I517 Cardiomegaly: Secondary | ICD-10-CM | POA: Diagnosis not present

## 2021-01-25 DIAGNOSIS — I63511 Cerebral infarction due to unspecified occlusion or stenosis of right middle cerebral artery: Secondary | ICD-10-CM | POA: Diagnosis not present

## 2021-01-25 DIAGNOSIS — J3489 Other specified disorders of nose and nasal sinuses: Secondary | ICD-10-CM | POA: Diagnosis not present

## 2021-01-25 DIAGNOSIS — I629 Nontraumatic intracranial hemorrhage, unspecified: Secondary | ICD-10-CM | POA: Diagnosis not present

## 2021-01-25 DIAGNOSIS — J1282 Pneumonia due to coronavirus disease 2019: Secondary | ICD-10-CM | POA: Diagnosis not present

## 2021-01-25 DIAGNOSIS — G8194 Hemiplegia, unspecified affecting left nondominant side: Secondary | ICD-10-CM | POA: Diagnosis present

## 2021-01-25 DIAGNOSIS — J9601 Acute respiratory failure with hypoxia: Secondary | ICD-10-CM | POA: Diagnosis not present

## 2021-01-25 HISTORY — PX: IR CT HEAD LTD: IMG2386

## 2021-01-25 HISTORY — PX: IR ANGIO INTRA EXTRACRAN SEL COM CAROTID INNOMINATE UNI L MOD SED: IMG5358

## 2021-01-25 HISTORY — PX: IR PERCUTANEOUS ART THROMBECTOMY/INFUSION INTRACRANIAL INC DIAG ANGIO: IMG6087

## 2021-01-25 LAB — POCT I-STAT 7, (LYTES, BLD GAS, ICA,H+H)
Acid-base deficit: 1 mmol/L (ref 0.0–2.0)
Bicarbonate: 24.7 mmol/L (ref 20.0–28.0)
Calcium, Ion: 1.08 mmol/L — ABNORMAL LOW (ref 1.15–1.40)
HCT: 43 % (ref 39.0–52.0)
Hemoglobin: 14.6 g/dL (ref 13.0–17.0)
O2 Saturation: 100 %
Patient temperature: 93.4
Potassium: 4 mmol/L (ref 3.5–5.1)
Sodium: 130 mmol/L — ABNORMAL LOW (ref 135–145)
TCO2: 26 mmol/L (ref 22–32)
pCO2 arterial: 38.3 mmHg (ref 32.0–48.0)
pH, Arterial: 7.404 (ref 7.350–7.450)
pO2, Arterial: 336 mmHg — ABNORMAL HIGH (ref 83.0–108.0)

## 2021-01-25 LAB — CBC WITH DIFFERENTIAL/PLATELET
Abs Immature Granulocytes: 0.04 10*3/uL (ref 0.00–0.07)
Basophils Absolute: 0 10*3/uL (ref 0.0–0.1)
Basophils Relative: 0 %
Eosinophils Absolute: 0.1 10*3/uL (ref 0.0–0.5)
Eosinophils Relative: 1 %
HCT: 42.3 % (ref 39.0–52.0)
Hemoglobin: 14.6 g/dL (ref 13.0–17.0)
Immature Granulocytes: 0 %
Lymphocytes Relative: 10 %
Lymphs Abs: 1.3 10*3/uL (ref 0.7–4.0)
MCH: 30.1 pg (ref 26.0–34.0)
MCHC: 34.5 g/dL (ref 30.0–36.0)
MCV: 87.2 fL (ref 80.0–100.0)
Monocytes Absolute: 1.2 10*3/uL — ABNORMAL HIGH (ref 0.1–1.0)
Monocytes Relative: 9 %
Neutro Abs: 10.3 10*3/uL — ABNORMAL HIGH (ref 1.7–7.7)
Neutrophils Relative %: 80 %
Platelets: 239 10*3/uL (ref 150–400)
RBC: 4.85 MIL/uL (ref 4.22–5.81)
RDW: 12.3 % (ref 11.5–15.5)
WBC: 12.9 10*3/uL — ABNORMAL HIGH (ref 4.0–10.5)
nRBC: 0 % (ref 0.0–0.2)

## 2021-01-25 LAB — LIPID PANEL
Cholesterol: 195 mg/dL (ref 0–200)
HDL: 38 mg/dL — ABNORMAL LOW (ref >40)
LDL Cholesterol: 126 mg/dL — ABNORMAL HIGH (ref 0–99)
Total CHOL/HDL Ratio: 5.1 RATIO
Triglycerides: 156 mg/dL — ABNORMAL HIGH (ref <150)
VLDL: 31 mg/dL (ref 0–40)

## 2021-01-25 LAB — RAPID URINE DRUG SCREEN, HOSP PERFORMED
Amphetamines: NOT DETECTED
Barbiturates: NOT DETECTED
Benzodiazepines: NOT DETECTED
Cocaine: NOT DETECTED
Opiates: NOT DETECTED
Tetrahydrocannabinol: NOT DETECTED

## 2021-01-25 LAB — BASIC METABOLIC PANEL
Anion gap: 7 (ref 5–15)
BUN: 10 mg/dL (ref 8–23)
CO2: 23 mmol/L (ref 22–32)
Calcium: 8.1 mg/dL — ABNORMAL LOW (ref 8.9–10.3)
Chloride: 100 mmol/L (ref 98–111)
Creatinine, Ser: 0.73 mg/dL (ref 0.61–1.24)
GFR, Estimated: >60 mL/min (ref >60)
Glucose, Bld: 132 mg/dL — ABNORMAL HIGH (ref 70–99)
Potassium: 4.3 mmol/L (ref 3.5–5.1)
Sodium: 130 mmol/L — ABNORMAL LOW (ref 135–145)

## 2021-01-25 LAB — HEMOGLOBIN A1C
Hgb A1c MFr Bld: 5.8 % — ABNORMAL HIGH (ref 4.8–5.6)
Mean Plasma Glucose: 119.76 mg/dL

## 2021-01-25 LAB — MRSA PCR SCREENING: MRSA by PCR: NEGATIVE

## 2021-01-25 LAB — ECHOCARDIOGRAM COMPLETE
Area-P 1/2: 3.08 cm2
Height: 69 in
S' Lateral: 3.1 cm
Weight: 2913.6 oz

## 2021-01-25 LAB — RESP PANEL BY RT-PCR (FLU A&B, COVID) ARPGX2
Influenza A by PCR: NEGATIVE
Influenza B by PCR: NEGATIVE
SARS Coronavirus 2 by RT PCR: NEGATIVE

## 2021-01-25 LAB — HIV ANTIBODY (ROUTINE TESTING W REFLEX): HIV Screen 4th Generation wRfx: NONREACTIVE

## 2021-01-25 LAB — GLUCOSE, CAPILLARY: Glucose-Capillary: 180 mg/dL — ABNORMAL HIGH (ref 70–99)

## 2021-01-25 MED ORDER — POLYETHYLENE GLYCOL 3350 17 G PO PACK
17.0000 g | PACK | Freq: Every day | ORAL | Status: DC
  Administered 2021-01-25: 17 g
  Filled 2021-01-25: qty 1

## 2021-01-25 MED ORDER — PHENYLEPHRINE HCL-NACL 10-0.9 MG/250ML-% IV SOLN
INTRAVENOUS | Status: DC | PRN
  Administered 2021-01-25: 60 ug/min via INTRAVENOUS

## 2021-01-25 MED ORDER — DEXMEDETOMIDINE HCL IN NACL 400 MCG/100ML IV SOLN
0.4000 ug/kg/h | INTRAVENOUS | Status: DC
  Administered 2021-01-25: 0.4 ug/kg/h via INTRAVENOUS
  Filled 2021-01-25 (×2): qty 100

## 2021-01-25 MED ORDER — LIDOCAINE HCL (CARDIAC) PF 100 MG/5ML IV SOSY
PREFILLED_SYRINGE | INTRAVENOUS | Status: DC | PRN
  Administered 2021-01-24: 100 mg via INTRAVENOUS

## 2021-01-25 MED ORDER — DOCUSATE SODIUM 50 MG/5ML PO LIQD
100.0000 mg | Freq: Two times a day (BID) | ORAL | Status: DC
  Administered 2021-01-25 (×2): 100 mg
  Filled 2021-01-25 (×2): qty 10

## 2021-01-25 MED ORDER — FENTANYL 2500MCG IN NS 250ML (10MCG/ML) PREMIX INFUSION
25.0000 ug/h | INTRAVENOUS | Status: DC
  Administered 2021-01-25: 25 ug/h via INTRAVENOUS
  Filled 2021-01-25: qty 250

## 2021-01-25 MED ORDER — ACETAMINOPHEN 650 MG RE SUPP
650.0000 mg | RECTAL | Status: DC | PRN
  Administered 2021-02-04: 650 mg via RECTAL
  Filled 2021-01-25: qty 1

## 2021-01-25 MED ORDER — CHLORHEXIDINE GLUCONATE 0.12 % MT SOLN
15.0000 mL | Freq: Two times a day (BID) | OROMUCOSAL | Status: DC
  Administered 2021-01-26 – 2021-02-15 (×39): 15 mL via OROMUCOSAL
  Filled 2021-01-25 (×36): qty 15

## 2021-01-25 MED ORDER — CHLORHEXIDINE GLUCONATE CLOTH 2 % EX PADS
6.0000 | MEDICATED_PAD | Freq: Every day | CUTANEOUS | Status: DC
  Administered 2021-01-25 – 2021-02-15 (×20): 6 via TOPICAL

## 2021-01-25 MED ORDER — TICAGRELOR 90 MG PO TABS
90.0000 mg | ORAL_TABLET | Freq: Two times a day (BID) | ORAL | Status: DC
  Administered 2021-01-25 – 2021-02-14 (×41): 90 mg
  Filled 2021-01-25 (×40): qty 1

## 2021-01-25 MED ORDER — ORAL CARE MOUTH RINSE
15.0000 mL | Freq: Two times a day (BID) | OROMUCOSAL | Status: DC
  Administered 2021-01-26 – 2021-02-15 (×39): 15 mL via OROMUCOSAL

## 2021-01-25 MED ORDER — ROCURONIUM BROMIDE 100 MG/10ML IV SOLN
INTRAVENOUS | Status: DC | PRN
  Administered 2021-01-25: 10 mg via INTRAVENOUS
  Administered 2021-01-25: 70 mg via INTRAVENOUS
  Administered 2021-01-25: 20 mg via INTRAVENOUS

## 2021-01-25 MED ORDER — ACETAMINOPHEN 325 MG PO TABS: 650.0000 mg | ORAL_TABLET | ORAL | Status: DC | PRN

## 2021-01-25 MED ORDER — FENTANYL CITRATE (PF) 100 MCG/2ML IJ SOLN
INTRAMUSCULAR | Status: DC | PRN
  Administered 2021-01-24 – 2021-01-25 (×2): 50 ug via INTRAVENOUS
  Administered 2021-01-25 (×2): 25 ug via INTRAVENOUS

## 2021-01-25 MED ORDER — LACTATED RINGERS IV SOLN: INTRAVENOUS | Status: DC | PRN

## 2021-01-25 MED ORDER — CLEVIDIPINE BUTYRATE 0.5 MG/ML IV EMUL: 0.0000 mg/h | INTRAVENOUS | Status: DC

## 2021-01-25 MED ORDER — TICAGRELOR 90 MG PO TABS
90.0000 mg | ORAL_TABLET | Freq: Two times a day (BID) | ORAL | Status: DC
  Administered 2021-02-14 – 2021-02-15 (×2): 90 mg via ORAL
  Filled 2021-01-25 (×16): qty 1

## 2021-01-25 MED ORDER — SENNOSIDES-DOCUSATE SODIUM 8.6-50 MG PO TABS: 1.0000 | ORAL_TABLET | Freq: Every evening | ORAL | Status: DC | PRN

## 2021-01-25 MED ORDER — IOHEXOL 300 MG/ML  SOLN
150.0000 mL | Freq: Once | INTRAMUSCULAR | Status: AC | PRN
  Administered 2021-01-25: 30 mL via INTRA_ARTERIAL

## 2021-01-25 MED ORDER — METOCLOPRAMIDE HCL 5 MG/ML IJ SOLN
10.0000 mg | Freq: Four times a day (QID) | INTRAMUSCULAR | Status: DC
  Administered 2021-01-25 – 2021-01-26 (×3): 10 mg via INTRAVENOUS
  Filled 2021-01-25 (×3): qty 2

## 2021-01-25 MED ORDER — DEXTROSE-NACL 5-0.9 % IV SOLN: INTRAVENOUS | Status: DC

## 2021-01-25 MED ORDER — PANTOPRAZOLE SODIUM 40 MG IV SOLR
40.0000 mg | Freq: Every day | INTRAVENOUS | Status: DC
  Administered 2021-01-25: 40 mg via INTRAVENOUS
  Filled 2021-01-25: qty 40

## 2021-01-25 MED ORDER — ASPIRIN 81 MG PO CHEW
81.0000 mg | CHEWABLE_TABLET | Freq: Every day | ORAL | Status: DC
  Administered 2021-01-26 – 2021-02-14 (×20): 81 mg
  Filled 2021-01-25 (×17): qty 1

## 2021-01-25 MED ORDER — ACETAMINOPHEN 650 MG RE SUPP: 650.0000 mg | RECTAL | Status: DC | PRN

## 2021-01-25 MED ORDER — GLYCOPYRROLATE 0.2 MG/ML IJ SOLN
INTRAMUSCULAR | Status: DC | PRN
  Administered 2021-01-25 (×2): .2 mg via INTRAVENOUS

## 2021-01-25 MED ORDER — PROPOFOL 10 MG/ML IV BOLUS
INTRAVENOUS | Status: DC | PRN
  Administered 2021-01-24: 80 mg via INTRAVENOUS

## 2021-01-25 MED ORDER — FENTANYL BOLUS VIA INFUSION
25.0000 ug | INTRAVENOUS | Status: DC | PRN
  Administered 2021-01-25: 25 ug via INTRAVENOUS
  Filled 2021-01-25: qty 100

## 2021-01-25 MED ORDER — SUCCINYLCHOLINE CHLORIDE 20 MG/ML IJ SOLN
INTRAMUSCULAR | Status: DC | PRN
  Administered 2021-01-24: 120 mg via INTRAVENOUS

## 2021-01-25 MED ORDER — ASPIRIN 81 MG PO CHEW
81.0000 mg | CHEWABLE_TABLET | Freq: Every day | ORAL | Status: DC
  Administered 2021-02-15: 81 mg via ORAL
  Filled 2021-01-25 (×10): qty 1

## 2021-01-25 MED ORDER — FENTANYL CITRATE (PF) 100 MCG/2ML IJ SOLN: 25.0000 ug | Freq: Once | INTRAMUSCULAR | Status: AC

## 2021-01-25 MED ORDER — IOHEXOL 300 MG/ML  SOLN
150.0000 mL | Freq: Once | INTRAMUSCULAR | Status: AC | PRN
  Administered 2021-01-25: 100 mL via INTRA_ARTERIAL

## 2021-01-25 MED ORDER — IOHEXOL 300 MG/ML  SOLN
50.0000 mL | Freq: Once | INTRAMUSCULAR | Status: AC | PRN
  Administered 2021-01-25: 50 mL via INTRA_ARTERIAL

## 2021-01-25 MED ORDER — PROPOFOL 500 MG/50ML IV EMUL
INTRAVENOUS | Status: DC | PRN
  Administered 2021-01-25: 50 ug/kg/min via INTRAVENOUS

## 2021-01-25 MED ORDER — ATORVASTATIN CALCIUM 80 MG PO TABS
80.0000 mg | ORAL_TABLET | Freq: Every day | ORAL | Status: DC
  Administered 2021-01-25: 80 mg via ORAL
  Filled 2021-01-25: qty 1

## 2021-01-25 MED ORDER — PHENYLEPHRINE HCL (PRESSORS) 10 MG/ML IV SOLN
INTRAVENOUS | Status: DC | PRN
  Administered 2021-01-25 (×2): 80 ug via INTRAVENOUS

## 2021-01-25 MED ORDER — ORAL CARE MOUTH RINSE
15.0000 mL | OROMUCOSAL | Status: DC
  Administered 2021-01-25 (×7): 15 mL via OROMUCOSAL

## 2021-01-25 MED ORDER — ACETAMINOPHEN 160 MG/5ML PO SOLN
650.0000 mg | ORAL | Status: DC | PRN
Start: 2021-01-25 — End: 2021-02-15
  Administered 2021-01-26 – 2021-02-07 (×7): 650 mg
  Filled 2021-01-25 (×7): qty 20.3

## 2021-01-25 MED ORDER — STROKE: EARLY STAGES OF RECOVERY BOOK
Freq: Once | Status: AC
  Filled 2021-01-25: qty 1

## 2021-01-25 MED ORDER — SODIUM CHLORIDE 0.9 % IV SOLN: INTRAVENOUS | Status: DC

## 2021-01-25 MED ORDER — CHLORHEXIDINE GLUCONATE 0.12% ORAL RINSE (MEDLINE KIT)
15.0000 mL | Freq: Two times a day (BID) | OROMUCOSAL | Status: DC
  Administered 2021-01-25 (×2): 15 mL via OROMUCOSAL

## 2021-01-25 MED ORDER — ALBUMIN HUMAN 5 % IV SOLN: INTRAVENOUS | Status: DC | PRN

## 2021-01-25 MED ORDER — ACETAMINOPHEN 160 MG/5ML PO SOLN: 650.0000 mg | ORAL | Status: DC | PRN

## 2021-01-25 MED ORDER — PROPOFOL 1000 MG/100ML IV EMUL
0.0000 ug/kg/min | INTRAVENOUS | Status: DC
  Administered 2021-01-25: 30 ug/kg/min via INTRAVENOUS
  Administered 2021-01-25: 20 ug/kg/min via INTRAVENOUS
  Filled 2021-01-25 (×2): qty 100

## 2021-01-25 NOTE — Progress Notes (Signed)
The only belongings with patient on arrival from IR was a pair of socks.

## 2021-01-25 NOTE — Anesthesia Postprocedure Evaluation (Signed)
Anesthesia Post Note  Patient: Leonard [name]  Procedure(s) Performed: IR WITH ANESTHESIA (N/A )     Patient location during evaluation: SICU Anesthesia Type: General Level of consciousness: sedated Pain management: pain level controlled Vital Signs Assessment: post-procedure vital signs reviewed and stable Respiratory status: patient remains intubated per anesthesia plan Cardiovascular status: stable Postop Assessment: no apparent nausea or vomiting Anesthetic complications: no   No complications documented.  Last Vitals:  Vitals:   01/25/21 0545 01/25/21 0600  BP: (!) 99/57   Pulse: 65 75  Resp: (!) 0 15  Temp:    SpO2: 99% 99%    Last Pain:  Vitals:   01/25/21 0400  TempSrc: Axillary                 Zhanae Proffit DANIEL

## 2021-01-25 NOTE — Progress Notes (Signed)
PT Cancellation Note  Patient Details Name: Leonard [name] MRN: [id number] DOB: 12-12-1955   Cancelled Treatment:    Reason Eval/Treat Not Completed: Active bedrest order. Coordinated with RN to notify PT if pt becomes appropriate with bedrest orders discharged later this date. Will follow-up as time permits.   Raymond Gurney, PT, DPT Acute Rehabilitation Services  Pager: [phone] Office: [phone]    Jewel Baize 01/25/2021, 8:28 AM

## 2021-01-25 NOTE — Progress Notes (Signed)
During my neuro assessment, patient vomited a large amount of brown fluid. Dr. Roda Shutters was notified. Will continue to monitor.

## 2021-01-25 NOTE — Anesthesia Procedure Notes (Signed)
Arterial Line Insertion Start/End4/20/2022 11:45 PM, 01/24/2021 11:50 PM Performed by: Zollie Scale, CRNA, CRNA  Emergency situation Left, radial was placed Catheter size: 20 G Hand hygiene performed  and Seldinger technique used Allen's test indicative of satisfactory collateral circulation Attempts: 1 Procedure performed without using ultrasound guided technique. Following insertion, dressing applied and Biopatch. Patient tolerated the procedure well with no immediate complications.

## 2021-01-25 NOTE — Progress Notes (Signed)
eLink Physician-Brief Progress Note Patient Name: Leonard [name] DOB: 1955-10-25 MRN: [id number]   Date of Service  01/25/2021  HPI/Events of Note  Hyperglycemia - Blood glucose = 180. Patient had episode of blood glucose = 71 yesterday.   eICU Interventions  Plan: 1. Decrease D5 0.9 NaCl IV infusion to 30 mL/hour. 2. Continue to trend blood glucose.      Intervention Category Major Interventions: Hyperglycemia - active titration of insulin therapy  Lenell Antu 01/25/2021, 9:18 PM

## 2021-01-25 NOTE — Progress Notes (Signed)
  Echocardiogram 2D Echocardiogram has been performed.  Leonard [name] Declan Mier 01/25/2021, 12:57 PM

## 2021-01-25 NOTE — Progress Notes (Signed)
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  Subjective: Patient remains intubated/sedated. Per bedside RN he has demonstrated non-purposeful movement in his right arm/leg and left leg; no movement identified on the left upper extremity.    Allergies: Patient has no allergy information on record.  Medications: Prior to Admission medications   Not on File     Vital Signs: BP 114/63   Pulse 71   Temp 98.4 F (36.9 C) (Axillary)   Resp 16   Ht 5\' 9"  (1.753 m)   Wt 182 lb 1.6 oz (82.6 kg)   SpO2 95%   BMI 26.89 kg/m   Physical Exam Constitutional:      General: He is not in acute distress.    Comments: Intubated/ventilator. Sedated with propofol and fentanyl  Cardiovascular:     Rate and Rhythm: Normal rate and regular rhythm.     Pulses: Normal pulses.          Dorsalis pedis pulses are 2+ on the right side and 2+ on the left side.     Heart sounds: Normal heart sounds.     Comments: Right groin vascular access site is clean, dry and soft. No evidence of hematoma. Left radial arterial line.  Pulmonary:     Breath sounds: Normal breath sounds.     Comments: Intubated/ventilator Abdominal:     General: Bowel sounds are normal.     Palpations: Abdomen is soft.  Musculoskeletal:     Right lower leg: No edema.     Left lower leg: No edema.  Skin:    General: Skin is warm and dry.     Imaging: CT CEREBRAL PERFUSION W CONTRAST  Result Date: 01/24/2021 CLINICAL DATA:  Left-sided deficits EXAM: CT ANGIOGRAPHY HEAD AND NECK CT PERFUSION BRAIN TECHNIQUE: Multidetector CT imaging of the head and neck was performed using the standard protocol during bolus administration of intravenous contrast. Multiplanar CT image reconstructions and MIPs  were obtained to evaluate the vascular anatomy. Carotid stenosis measurements (when applicable) are obtained utilizing NASCET criteria, using the distal internal carotid diameter as the denominator. Multiphase CT imaging of the brain was performed following IV bolus contrast injection. Subsequent parametric perfusion maps were calculated using RAPID software. CONTRAST:  100mL OMNIPAQUE IOHEXOL 350 MG/ML SOLN COMPARISON:  None. FINDINGS: CTA NECK FINDINGS SKELETON: There is no bony spinal canal stenosis. No lytic or blastic lesion. OTHER NECK: Normal pharynx, larynx and major salivary glands. No cervical lymphadenopathy. Unremarkable thyroid gland. UPPER CHEST: No pneumothorax or pleural effusion. No nodules or masses. AORTIC ARCH: There is calcific atherosclerosis of the aortic arch. There is no aneurysm, dissection or hemodynamically significant stenosis of the visualized portion of the aorta. Conventional 3 vessel aortic branching pattern. The visualized proximal subclavian arteries are widely patent. 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 The right common and external carotid arteries are normal. LEFT CAROTID SYSTEM: No dissection, occlusion or aneurysm. Mild atherosclerotic calcification at the carotid bifurcation without hemodynamically significant stenosis. VERTEBRAL ARTERIES: Left dominant configuration. Both origins are clearly patent. There is no dissection, occlusion or flow-limiting stenosis to the skull base (V1-V3 segments). CTA HEAD FINDINGS POSTERIOR CIRCULATION: --Vertebral arteries: Normal V4 segments. --Inferior cerebellar arteries: Normal. --Basilar artery: Normal. --Superior cerebellar arteries: Normal. --Posterior cerebral arteries (PCA): Normal. ANTERIOR CIRCULATION: --Intracranial internal  carotid arteries: Normal. --Anterior cerebral arteries (ACA): Normal. Both A1 segments are present. Patent anterior communicating artery (a-comm). --Middle cerebral arteries (MCA):  Complete occlusion of the right MCA at its origin. There is no collateral flow demonstrated. VENOUS SINUSES: As permitted by contrast timing, patent. ANATOMIC VARIANTS: None Review of the MIP images confirms the above findings. CT Brain Perfusion Findings: ASPECTS: 7 CBF (<30%) Volume: 36mL Perfusion (Tmax>6.0s) volume: 2 L51mL Mismatch Volume: Infarction Location:Right MCA territory IMPRESSION: 1. 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 2. 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 3. 68 mL right MCA territory core infarct with 133 mL area of ischemic penumbra. Critical Value/emergent results were called by telephone at the time of interpretation on 01/24/2021 at 11:24 pm to provider ERIC [facility] , who verbally acknowledged these results. Aortic Atherosclerosis (ICD10-I70.0). Electronically Signed   By: Deatra Robinson M.D.   On: 01/24/2021 23:34   DG CHEST PORT 1 VIEW  Result Date: 01/25/2021 CLINICAL DATA:  Intubation. EXAM: PORTABLE CHEST 1 VIEW COMPARISON:  No prior. FINDINGS: Endotracheal tube and NG tube in good anatomic position. Cardiomegaly. No pulmonary venous congestion. Low lung volumes with bibasilar atelectasis. No pleural effusion or pneumothorax. IMPRESSION: 1.  Endotracheal tube and NG tube in good anatomic position. 2.  Cardiomegaly.  No pulmonary venous congestion. 3.  Low lung volumes with bibasilar atelectasis. Electronically Signed   By: Maisie Fus  Register   On: 01/25/2021 05:11   CT HEAD CODE STROKE WO CONTRAST  Result Date: 01/24/2021 CLINICAL DATA:  Code stroke.  Slurred speech and left-sided deficits EXAM: CT HEAD WITHOUT CONTRAST TECHNIQUE: Contiguous axial images were obtained from the base of the skull through the vertex without intravenous contrast. COMPARISON:  None. FINDINGS: Brain: There is no mass, hemorrhage or extra-axial collection. The size and configuration of  the ventricles and extra-axial CSF spaces are normal. There is loss of gray-white differentiation within the right insula and mid MCA territory. Vascular: Hyperdense appearance of the right MCA. Skull: The visualized skull base, calvarium and extracranial soft tissues are normal. Sinuses/Orbits: No fluid levels or advanced mucosal thickening of the visualized paranasal sinuses. No mastoid or middle ear effusion. The orbits are normal. ASPECTS [facility] Stroke Program Early CT Score) - Ganglionic level infarction (caudate, lentiform nuclei, internal capsule, insula, M1-M3 cortex): 5 - Supraganglionic infarction (M4-M6 cortex): 2 Total score (0-10 with 10 being normal): 7 IMPRESSION: 1. 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 2. Hyperdense appearance of the right MCA. 3. ASPECTS is 7. These results were called by telephone at the time of interpretation on 01/24/2021 at 10:58 pm to provider Caryl Pina, who verbally acknowledged these results. Electronically Signed   By: Deatra Robinson M.D.   On: 01/24/2021 22:58   CT ANGIO HEAD CODE STROKE  Result Date: 01/24/2021 CLINICAL DATA:  Left-sided deficits EXAM: CT ANGIOGRAPHY HEAD AND NECK CT PERFUSION BRAIN TECHNIQUE: Multidetector CT imaging of the head and neck was performed using the standard protocol during bolus administration of intravenous contrast. Multiplanar CT image reconstructions and MIPs were obtained to evaluate the vascular anatomy. Carotid stenosis measurements (when applicable) are obtained utilizing NASCET criteria, using the distal internal carotid diameter as the denominator. Multiphase CT imaging of the brain was performed following IV bolus contrast injection. Subsequent parametric perfusion maps were calculated using RAPID software. CONTRAST:  OMNIPAQUE IOHEXOL 350 MG/ML SOLN COMPARISON:  None.  FINDINGS: CTA NECK FINDINGS SKELETON: There is no bony spinal canal stenosis. No lytic or blastic lesion. OTHER NECK:  Normal pharynx, larynx and major salivary glands. No cervical lymphadenopathy. Unremarkable thyroid gland. UPPER CHEST: No pneumothorax or pleural effusion. No nodules or masses. AORTIC ARCH: There is calcific atherosclerosis of the aortic arch. There is no aneurysm, dissection or hemodynamically significant stenosis of the visualized portion of the aorta. Conventional 3 vessel aortic branching pattern. The visualized proximal subclavian arteries are widely patent. 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 The right common and external carotid arteries are normal. LEFT CAROTID SYSTEM: No dissection, occlusion or aneurysm. Mild atherosclerotic calcification at the carotid bifurcation without hemodynamically significant stenosis. VERTEBRAL ARTERIES: Left dominant configuration. Both origins are clearly patent. There is no dissection, occlusion or flow-limiting stenosis to the skull base (V1-V3 segments). CTA HEAD FINDINGS POSTERIOR CIRCULATION: --Vertebral arteries: Normal V4 segments. --Inferior cerebellar arteries: Normal. --Basilar artery: Normal. --Superior cerebellar arteries: Normal. --Posterior cerebral arteries (PCA): Normal. ANTERIOR CIRCULATION: --Intracranial internal carotid arteries: Normal. --Anterior cerebral arteries (ACA): Normal. Both A1 segments are present. Patent anterior communicating artery (a-comm). --Middle cerebral arteries (MCA): Complete occlusion of the right MCA at its origin. There is no collateral flow demonstrated. VENOUS SINUSES: As permitted by contrast timing, patent. ANATOMIC VARIANTS: None Review of the MIP images confirms the above findings. CT Brain Perfusion Findings: ASPECTS: 7 CBF (<30%) Volume: 41mL Perfusion (Tmax>6.0s) volume: 2 L25mL Mismatch Volume: Infarction Location:Right MCA territory IMPRESSION: 1. 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 2. Complete occlusion of the right middle cerebral artery at its origin with  no collateral flow in the right MCA territory. 3. 68 mL right MCA territory core infarct with 133 mL area of ischemic penumbra. Critical Value/emergent results were called by telephone at the time of interpretation on 01/24/2021 at 11:24 pm to provider ERIC [facility] , who verbally acknowledged these results. Aortic Atherosclerosis (ICD10-I70.0). Electronically Signed   By: Deatra Robinson M.D.   On: 01/24/2021 23:34   CT ANGIO NECK CODE STROKE  Result Date: 01/24/2021 CLINICAL DATA:  Left-sided deficits EXAM: CT ANGIOGRAPHY HEAD AND NECK CT PERFUSION BRAIN TECHNIQUE: Multidetector CT imaging of the head and neck was performed using the standard protocol during bolus administration of intravenous contrast. Multiplanar CT image reconstructions and MIPs were obtained to evaluate the vascular anatomy. Carotid stenosis measurements (when applicable) are obtained utilizing NASCET criteria, using the distal internal carotid diameter as the denominator. Multiphase CT imaging of the brain was performed following IV bolus contrast injection. Subsequent parametric perfusion maps were calculated using RAPID software. CONTRAST:  OMNIPAQUE IOHEXOL 350 MG/ML SOLN COMPARISON:  None. FINDINGS: CTA NECK FINDINGS SKELETON: There is no bony spinal canal stenosis. No lytic or blastic lesion. OTHER NECK: Normal pharynx, larynx and major salivary glands. No cervical lymphadenopathy. Unremarkable thyroid gland. UPPER CHEST: No pneumothorax or pleural effusion. No nodules or masses. AORTIC ARCH: There is calcific atherosclerosis of the aortic arch. There is no aneurysm, dissection or hemodynamically significant stenosis of the visualized portion of the aorta. Conventional 3 vessel aortic branching pattern. The visualized proximal subclavian arteries are widely patent. 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 The right common and external carotid arteries are normal. LEFT CAROTID SYSTEM: No dissection, occlusion or  aneurysm. Mild atherosclerotic calcification at the carotid bifurcation without hemodynamically significant stenosis. VERTEBRAL ARTERIES: Left dominant configuration. Both origins are clearly patent. There is no dissection, occlusion or flow-limiting stenosis  to the skull base (V1-V3 segments). CTA HEAD FINDINGS POSTERIOR CIRCULATION: --Vertebral arteries: Normal V4 segments. --Inferior cerebellar arteries: Normal. --Basilar artery: Normal. --Superior cerebellar arteries: Normal. --Posterior cerebral arteries (PCA): Normal. ANTERIOR CIRCULATION: --Intracranial internal carotid arteries: Normal. --Anterior cerebral arteries (ACA): Normal. Both A1 segments are present. Patent anterior communicating artery (a-comm). --Middle cerebral arteries (MCA): Complete occlusion of the right MCA at its origin. There is no collateral flow demonstrated. VENOUS SINUSES: As permitted by contrast timing, patent. ANATOMIC VARIANTS: None Review of the MIP images confirms the above findings. CT Brain Perfusion Findings: ASPECTS: 7 CBF (<30%) Volume: 32mL Perfusion (Tmax>6.0s) volume: 2 L88mL Mismatch Volume: Infarction Location:Right MCA territory IMPRESSION: 1. 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 2. 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 3. 68 mL right MCA territory core infarct with 133 mL area of ischemic penumbra. Critical Value/emergent results were called by telephone at the time of interpretation on 01/24/2021 at 11:24 pm to provider ERIC [facility] , who verbally acknowledged these results. Aortic Atherosclerosis (ICD10-I70.0). Electronically Signed   By: Deatra Robinson M.D.   On: 01/24/2021 23:34    Labs:  CBC: Recent Labs    01/24/21 2235 01/24/21 2245 01/25/21 0342 01/25/21 0404  WBC 9.7  --  12.9*  --   HGB 16.9 16.7 14.6 14.6  HCT 50.2 49.0 42.3 43.0  PLT 251  --  239  --     COAGS: Recent  Labs    01/24/21 2235  INR 1.0  APTT 32    BMP: Recent Labs    01/24/21 2235 01/24/21 2245 01/25/21 0342 01/25/21 0404  NA 131* 132* 130* 130*  K 4.0 5.0 4.3 4.0  CL 95* 100 100  --   CO2 23  --  23  --   GLUCOSE 103* 101* 132*  --   BUN 9 11 10   --   CALCIUM 9.1  --  8.1*  --   CREATININE 0.74 0.70 0.73  --   GFRNONAA >60  --  >60  --     LIVER FUNCTION TESTS: Recent Labs    01/24/21 2235  BILITOT 1.1  AST 27  ALT 31  ALKPHOS 79  PROT 7.8  ALBUMIN 4.6    Assessment and Plan:  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  Patient has now been extubated and is stable on 2L nasal cannula. Cleviprex still infusing; first dose of Brilinta administered this morning at 0922. Aspirin 81 mg/day scheduled to start tomorrow. Right groin vascular site is clean, dry and soft.   MR imaging ordered for today is pending. IR will continue to follow.    Electronically Signed: 01/26/21, AGACNP-BC [phone] 01/25/2021, 11:06 AM   I spent a total of 15 Minutes at the the patient's bedside AND on the patient's hospital floor or unit, greater than 50% of which was counseling/coordinating care for right MCA territory infarct.

## 2021-01-25 NOTE — ED Triage Notes (Addendum)
EMS reports [place] was [place] a pond. Last known well was at 1830. Family had not seen or heard from pt in 3 hours, went to check on him, was found slumped over, left sided facial droop, and drooling. BP  - 205/111 on arrival.

## 2021-01-25 NOTE — Anesthesia Procedure Notes (Addendum)
Procedure Name: Intubation Date/Time: 01/24/2021 11:34 PM Performed by: Zollie Scale, CRNA Pre-anesthesia Checklist: Patient identified, Emergency Drugs available, Suction available and Patient being monitored Patient Re-evaluated:Patient Re-evaluated prior to induction Oxygen Delivery Method: Circle System Utilized Preoxygenation: Pre-oxygenation with 100% oxygen Induction Type: IV induction, Rapid sequence and Cricoid Pressure applied Laryngoscope Size: Glidescope and 4 Grade View: Grade I Tube type: Oral Tube size: 7.5 mm Number of attempts: 1 Airway Equipment and Method: Stylet and Video-laryngoscopy Placement Confirmation: ETT inserted through vocal cords under direct vision,  positive ETCO2 and breath sounds checked- equal and bilateral Secured at: 23 cm Tube secured with: Tape Dental Injury: Teeth and Oropharynx as per pre-operative assessment  Comments: Glidescope utilized d/t unknown COVID status. Noted bloody gastric secretions in posterior oropharynx upon VL. No noted aspiration of contents during intubation. Oropharynx was suctioned and OG placed.

## 2021-01-25 NOTE — Progress Notes (Signed)
Patient ID: Leonard [name], male   DOB: 1956/01/24, 65 y.o.   MRN: [id number] 80 Y RT H M MRS 0 LSW 6.30 PM. New onset of Lt hemiplegia , neglect,dysarthria and facial droop. CT Brain No ICH . ASPECTS 7.  CTA occluded RT  ICA prox and RT MCA and RT ICA terminus.. CTP core of 68 ml and penumbra of .   IV TPA given. Endovascular treatment discussed with family by Dr Otelia Limes. Procedure,reasons and alternatives reviewed. Risk of ICH of 10 % discussed,as well as of neuro decline and death. Family expressed understanding and provided permission to proceed with the treatment. S.Kevontay Burks MD

## 2021-01-25 NOTE — Progress Notes (Signed)
OT Cancellation Note  Patient Details Name: Leonard [name] MRN: [id number] DOB: 1956-09-30   Cancelled Treatment:    Reason Eval/Treat Not Completed: Active bedrest order  [facility] Anshul Meddings, OT/L   Acute OT Clinical Specialist Acute Rehabilitation Services Pager [phone] Office [phone]  01/25/2021, 6:57 AM

## 2021-01-25 NOTE — Procedures (Signed)
Extubation Procedure Note  Patient Details:   Name: Leonard [name] DOB: 01-28-1956 MRN: [id number]   Airway Documentation:    Vent end date: 01/25/21 Vent end time: 1035   Evaluation  O2 sats: stable throughout Complications: No apparent complications Patient did tolerate procedure well. Bilateral Breath Sounds: Diminished,Clear   Yes   Pt was extubated per Md order and placed on 2 L [place]. Cuff leak was noted prior to extubation and no stridor post. Pt is stable at this time. RT will monitor.   Merlene Laughter 01/25/2021, 10:38 AM

## 2021-01-25 NOTE — Progress Notes (Signed)
Right carotid duplex completed.  Results can be found under chart review under CV PROC. 01/25/2021 2:38 PM Jaylaa Gallion RVT, RDMS

## 2021-01-25 NOTE — Progress Notes (Signed)
SLP Cancellation Note  Patient Details Name: Leonard [name] MRN: [id number] DOB: 05-18-1956   Cancelled treatment:       Reason Eval/Treat Not Completed: Patient not medically ready (on vent). Will f/u as able.     Mahala Menghini., M.A. CCC-SLP Acute Rehabilitation Services Pager [phone] Office [phone]  01/25/2021, 7:40 AM

## 2021-01-25 NOTE — Progress Notes (Addendum)
STROKE TEAM PROGRESS NOTE   INTERVAL HISTORY 65 yo with no significant PMH who presented with acute onset of left hemiplegia, left facial droop, and dysarthria. Per report, he was last known normal around 1830 when he went for walk on his property. His family found him at approximately 2130 slumped to left side.  CT head on arrival showed right MCA territory hypodensity.  He was hypertensive at 232/124. tPA was given at approximately 2309.  He was take to IR for revascularization. He had right MCA/ACA/ICA thrombus retrieval with stent angioplasty.    Vitals:   01/25/21 0700 01/25/21 0722 01/25/21 0730 01/25/21 0800  BP: 110/68 (!) 140/52 112/65   Pulse: 67  70   Resp: (!) 0 17 17   Temp:    98.4 F (36.9 C)  TempSrc:    Axillary  SpO2: 99% 100% 98%   Weight:      Height:       CBC:  Recent Labs  Lab 01/24/21 2235 01/24/21 2245 01/25/21 0342 01/25/21 0404  WBC 9.7  --  12.9*  --   NEUTROABS 7.5  --  10.3*  --   HGB 16.9   < > 14.6 14.6  HCT 50.2   < > 42.3 43.0  MCV 89.8  --  87.2  --   PLT 251  --  239  --    < > = values in this interval not displayed.   Basic Metabolic Panel:  Recent Labs  Lab 01/24/21 2235 01/24/21 2245 01/25/21 0342 01/25/21 0404  NA 131* 132* 130* 130*  K 4.0 5.0 4.3 4.0  CL 95* 100 100  --   CO2 23  --  23  --   GLUCOSE 103* 101* 132*  --   BUN 9 11 10   --   CREATININE 0.74 0.70 0.73  --   CALCIUM 9.1  --  8.1*  --    Lipid Panel:  Recent Labs  Lab 01/25/21 0342  CHOL 195  TRIG 156*  HDL 38*  CHOLHDL 5.1  VLDL 31  LDLCALC 01/27/21*   HgbA1c:  Recent Labs  Lab 01/25/21 0342  HGBA1C 5.8*   Urine Drug Screen: No results for input(s): LABOPIA, COCAINSCRNUR, LABBENZ, AMPHETMU, THCU, LABBARB in the last 168 hours.  Alcohol Level No results for input(s): ETH in the last 168 hours.  IMAGING past 24 hours CT CEREBRAL PERFUSION W CONTRAST  Result Date: 01/24/2021 IMPRESSION:  1. Complete occlusion of the right internal carotid artery at  its origin.  2. 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  3. 68 mL right MCA territory core infarct with 133 mL area of ischemic penumbra.   DG CHEST PORT 1 VIEW  Result Date: 01/25/2021 IMPRESSION:  1.  Endotracheal tube and NG tube in good anatomic position.  2.  Cardiomegaly.  No pulmonary venous congestion.  3.  Low lung volumes with bibasilar atelectasis.     CT HEAD CODE STROKE WO CONTRAST  Result Date: 01/24/2021 IMPRESSION: 1. 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 2. Hyperdense appearance of the right MCA. 3. ASPECTS is 7.    CT ANGIO HEAD/NECK CODE STROKE  Result Date: 01/24/2021 IMPRESSION:  1. 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  2. 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 3. 68 mL  right MCA territory core infarct with 133 mL area of ischemic penumbra.   PHYSICAL EXAM HEENT-  [place]/AT Lungs - Respirations unlabored Extremities - Warm and well perfused  Patient is intubated and sedated.  PERL, no corneals, Does not blink to threat bilaterally, + cough and gag RUE spontaneous (2/5) RLE spontaneous (3/5) LUE no movement LLE withdraw 2/5  ASSESSMENT/[name] [name] is a 65 y.o. male with no significant PMH presenting with left hemiplegia, left facial droop, and dysarthria.  Was found to have an acute right MCA territory infarct s/p tPA and bilateral common carotid arteriograms followed by right MCA/ACA/ICA thrombus retrieval with stent angioplasty..   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 Per Dr. 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   Code Stroke Acute right MCA territory infarct ASPECTS 7   CTA head & neck: Complete occlusion of the R ICA and R MCA   CT perfusion  68 mL right MCA territory core infarct with 133 mL area of ischemic penumbra.  S/p IR with R MCA TICI3 and R carotid stenting.  MRI Pending   MRA  pending  Right carotid duplex: ICA stent appears patent  2D Echo: EF 60-65%, mild LVH  LDL 126  HgbA1c 5.8  UDS negative  VTE prophylaxis - SCDs  Diet: NPO  aspirin 81 mg daily prior to admission, now on aspirin 81 mg daily and Brilinta (ticagrelor) 90 mg bid.   Therapy recommendations:  pending  Disposition: pending  Carotid occlusion  CTA neck: Complete occlusion of the R ICA   S/p IR with R carotid stenting.  Per Dr. 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  Right carotid duplex: ICA stent appears patent  On aspirin and Brilinta DAPT.  Post operative respiratory insufficiency  Extubated this morning  Stable on 2L [place]  Hypertension  Home meds:  none  Unstable, requiring cleveprix, titrate . SBP goal 120-140 post IR . Long-term BP goal normotensive  Hyperlipidemia  Home meds:  none  LDL 126, goal < 70  Add Lipitor 80g daily  Continue statin at discharge  Dysphagia  Secondary to stroke  NPO  SLP on board  NG tube placement for medication if not passing swallow tonight  Other Stroke Risk Factors  Advanced Age >/= 58   Other Active Problems  Leukocytosis, WBC 9.7-12.9  Hospital day # 0  Leonard [name], ACNP-BC Stroke NP  ATTENDING NOTE: I reviewed above note and agree with the assessment and [name]. Pt was seen and examined.   65 year old male with no significant past medical history admitted for left-sided weakness, left facial droop, left neglect and slurred speech.  BP high on presentation.  CT head showed right MCA early ischemic changes with right MCA hyperdense sign.  Status post tPA.  CTA head and neck showed right ICA and MCA occlusion.  CTP showed large penumbra.  Status post IR with MCA TICI3 and right carotid stenting.  Carotid Doppler showed patent stent.  EF 60 to 65%.   MRI and MRA pending.  LDL 126 and A1c 5.8.  UDS negative.  WBC 12.9, creatinine 0.73.  On exam, patient was extubated just off sedation, on weaning trial. Eyes closed, not following commands. With forced eye opening, eyes in need position, not blinking to visual threat, spontaneous eye rolling movement, not tracking, PERRL. Corneal reflex absent bilaterally, gag and cough present. Breathing over the vent.  Facial symmetry not able  to test due to ET tube.  Tongue protrusion not cooperative.  Spontaneous moving of right upper and lower extremities.  On pain stimulation, no movement around left upper extremity, mild withdrawal left lower extremity. DTR diminished and no babinski. Sensation, coordination and gait not tested.  Etiology for patient stroke likely due to right ICA occlusion, per IR Dr. Corliss Skains, ICA occlusion is due to atherosclerosis disease.  Currently [name] to extubate this morning, continue aspirin and Brilinta post stenting.  On high-dose Lipitor.  We will do MRI and MRA around 24 hours post tPA.  [name] to have a swallow evaluation after extubation, if not able to pass swallow, need NG tube for evening medication.  Seybold consider 30-day cardiac event monitor as outpatient to rule out A. Fib.  PT/OT pending.  For detailed assessment and [name], please refer to above as I have made changes wherever appropriate.   Leonard Plan, MD PhD Stroke Neurology 01/25/2021 7:00 PM  This patient is critically ill due to right ICA and MCA occlusion, right MCA stroke set post thrombectomy and carotid stenting, hyperlipidemia and dysphagia and at significant risk of neurological worsening, death form recurrent stroke, hemorrhagic conversion, stent reocclusion, bleeding from tPA and seizure. This patient's care requires constant monitoring of vital signs, hemodynamics, respiratory and cardiac monitoring, review of multiple databases, neurological assessment, discussion with family, other specialists and medical  decision making of high complexity. I spent 40 minutes of neurocritical care time in the care of this patient.  I discussed with Dr. Merrily Pew. I had long discussion with wife over the phone, updated pt current condition, treatment [name] and potential prognosis, and answered all the questions.  She expressed understanding and appreciation.     To contact Stroke Continuity provider, please refer to [email or web address]. After hours, contact General Neurology

## 2021-01-25 NOTE — Progress Notes (Signed)
SBP 190-200, cleviprex at max of 21mg /h. Ok to go up to 30mg /h per Dr. .

## 2021-01-25 NOTE — Transfer of Care (Signed)
Immediate Anesthesia Transfer of Care Note  Patient: Leonard [name]  Procedure(s) Performed: IR WITH ANESTHESIA (N/A )  Patient Location: ICU  Anesthesia Type:General  Level of Consciousness: Patient remains intubated per anesthesia plan  Airway & Oxygen Therapy: Patient remains intubated per anesthesia plan and Patient placed on Ventilator (see vital sign flow sheet for setting)  Post-op Assessment: Report given to RN and Post -op Vital signs reviewed and unstable, Anesthesiologist notified  Post vital signs: Reviewed and stable  Last Vitals:  Vitals Value Taken Time  BP 138/76 01/25/21 0334  Temp    Pulse 93 01/25/21 0332  Resp 19 01/25/21 0335  SpO2 100 % 01/25/21 0332  Vitals shown include unvalidated device data.  Last Pain: There were no vitals filed for this visit.       Complications: No complications documented.

## 2021-01-25 NOTE — Consult Note (Signed)
NAME:  Leonard [name], MRN:  [id number], DOB:  November 25, 1955, LOS: 0 ADMISSION DATE:  01/24/2021, CONSULTATION DATE:  01/25/21 REFERRING MD:  Dr. Corliss Skains, CHIEF COMPLAINT:  L hemiplegia  History of Present Illness:  65 yo with no significant pmh who presented from home via EMS for acute onset L hemiplegia, L facial droop, dysarthria and L hemineglect. Last normal was 1830 when he went for walk on his property. His family found him around 2130 slumped to left side and EMS was called.   Upon arrival symptoms remained same and stat cth showed hypodensity in the R MCA. His BP was 232/124.   He was taken to IR for revascularization. He had R MCA/ACA/ICA thrombus retrieval with stent angioplasty. Pt remains intubated for which CCM has been consulted  Pertinent  Medical History  none  Significant Hospital Events: Including procedures, antibiotic start and stop dates in addition to other pertinent events   . 4/21: presented with L hemiplegia, s/p intervention and remains intubated  Interim History / Subjective:  As above  Objective   Blood pressure (!) 176/82, resp. rate 20, weight 82.6 kg, SpO2 96 %.        Intake/Output Summary (Last 24 hours) at 01/25/2021 0316 Last data filed at 01/25/2021 0108 Gross per 24 hour  Intake 1250 ml  Output --  Net 1250 ml   Filed Weights   01/24/21 2200  Weight: 82.6 kg    Examination: General: sedated and intubated on vent HENT: perrla, L tongue deviation. mmmp Lungs: ctab Cardiovascular: rrr Abdomen: soft, nt, nd, bs + Extremities: no c/c/e. Still remains with paralytic on board Neuro: sedated and paralyzed GU: deferred  Labs/imaging that I havepersonally reviewed  (right click and "Reselect all SmartList Selections" daily)  Wbc 12.9  Resolved Hospital Problem list     Assessment & Plan:  Post operative resp insuff:  -titrate vent -sat/sbt when able.  -vap -check cxr for ett placement -add sedation  Acute RMCA CVA:  -s/p  intervention -per neuro -BP goal 120-140 -titrate cleviprex to goal -stroke w/u  Hypertension:  -s/p at presentation was >200 -goal is 120-140 systolic   Best practice (right click and "Reselect all SmartList Selections" daily)  Diet:  NPO Pain/Anxiety/Delirium protocol (if indicated): Yes (RASS goal -2) VAP protocol (if indicated): Yes DVT prophylaxis: Contraindicated GI prophylaxis: PPI Glucose control:  SSI No Central venous access:  N/A Arterial line:  Yes, and it is still needed Foley:  Yes, and it is still needed Mobility:  bed rest  PT consulted: Yes Last date of multidisciplinary goals of care discussion [pedning] Code Status:  full code Disposition: ICU  Labs   CBC: Recent Labs  Lab 01/24/21 2235 01/24/21 2245  WBC 9.7  --   NEUTROABS 7.5  --   HGB 16.9 16.7  HCT 50.2 49.0  MCV 89.8  --   PLT 251  --     Basic Metabolic Panel: Recent Labs  Lab 01/24/21 2235 01/24/21 2245  NA 131* 132*  K 4.0 5.0  CL 95* 100  CO2 23  --   GLUCOSE 103* 101*  BUN 9 11  CREATININE 0.74 0.70  CALCIUM 9.1  --    GFR: CrCl cannot be calculated (Unknown ideal weight.). Recent Labs  Lab 01/24/21 2235  WBC 9.7    Liver Function Tests: Recent Labs  Lab 01/24/21 2235  AST 27  ALT 31  ALKPHOS 79  BILITOT 1.1  PROT 7.8  ALBUMIN 4.6   No  results for input(s): LIPASE, AMYLASE in the last 168 hours. No results for input(s): AMMONIA in the last 168 hours.  ABG    Component Value Date/Time   TCO2 26 01/24/2021 2245     Coagulation Profile: Recent Labs  Lab 01/24/21 2235  INR 1.0    Cardiac Enzymes: No results for input(s): CKTOTAL, CKMB, CKMBINDEX, TROPONINI in the last 168 hours.  HbA1C: No results found for: HGBA1C  CBG: Recent Labs  Lab 01/24/21 2237  GLUCAP 71    Review of Systems:   Unobtainable 2/2 intubation and sedation  Past Medical History:  He,  has no past medical history on file.   Surgical History:  unobtainable    Social History:      Family History:  His family history is not on file.   Allergies Not on File   Home Medications  Prior to Admission medications   Not on File     Critical care time: The patient is critically ill with multiple organ systems failure and requires high complexity decision making for assessment and support, frequent evaluation and titration of therapies, application of advanced monitoring technologies and extensive interpretation of multiple databases.  Critical care time 35 mins. This represents my time independent of the NPs time taking care of the pt. This is excluding procedures.    Briant Sites DO [facility] 01/25/2021, 3:16 AM See Amion for pager If no response to pager, please call [phone] until 1900 After 1900 please call [facility] [phone]

## 2021-01-25 NOTE — Procedures (Signed)
S/P Bilateral common carotid arteriograms followed by completee revascularization of occluded RT MCA and RT ACA prox and Rt ICA terminus with x 2 passes with ^.% mm x 47 mm embotrap retriever and contact aspiration achieving a TICI 3 revascularization. S/P stent assisted angioplasty  with prox flow arrest  of prox occlusion of RT ICA due to ASVD.  Post CT brain contrast blush in the RT basal ganglia . No mass effect. 54F angioseal for RT groin hemostasis . Distal pulsres all present. Patient left intubated .Awaiting Covid 19 test results. Meds. Aspirin 81mg  ,brilinta 180mg  via OG and 6 mg of Integrelin at time of stent placement. S.Jasha Hodzic MD

## 2021-01-26 ENCOUNTER — Inpatient Hospital Stay (HOSPITAL_COMMUNITY): Payer: Medicare HMO

## 2021-01-26 ENCOUNTER — Other Ambulatory Visit: Payer: Self-pay

## 2021-01-26 ENCOUNTER — Encounter (HOSPITAL_COMMUNITY): Payer: Self-pay | Admitting: Neurology

## 2021-01-26 DIAGNOSIS — I161 Hypertensive emergency: Secondary | ICD-10-CM

## 2021-01-26 DIAGNOSIS — I6601 Occlusion and stenosis of right middle cerebral artery: Secondary | ICD-10-CM | POA: Diagnosis not present

## 2021-01-26 DIAGNOSIS — I63511 Cerebral infarction due to unspecified occlusion or stenosis of right middle cerebral artery: Secondary | ICD-10-CM

## 2021-01-26 DIAGNOSIS — D62 Acute posthemorrhagic anemia: Secondary | ICD-10-CM

## 2021-01-26 DIAGNOSIS — I10 Essential (primary) hypertension: Secondary | ICD-10-CM | POA: Diagnosis not present

## 2021-01-26 DIAGNOSIS — R0682 Tachypnea, not elsewhere classified: Secondary | ICD-10-CM | POA: Diagnosis not present

## 2021-01-26 DIAGNOSIS — I69391 Dysphagia following cerebral infarction: Secondary | ICD-10-CM | POA: Diagnosis not present

## 2021-01-26 DIAGNOSIS — D72829 Elevated white blood cell count, unspecified: Secondary | ICD-10-CM

## 2021-01-26 DIAGNOSIS — I63231 Cerebral infarction due to unspecified occlusion or stenosis of right carotid arteries: Secondary | ICD-10-CM | POA: Diagnosis not present

## 2021-01-26 DIAGNOSIS — G936 Cerebral edema: Secondary | ICD-10-CM

## 2021-01-26 LAB — CBC
HCT: 35.3 % — ABNORMAL LOW (ref 39.0–52.0)
Hemoglobin: 11.8 g/dL — ABNORMAL LOW (ref 13.0–17.0)
MCH: 29.7 pg (ref 26.0–34.0)
MCHC: 33.4 g/dL (ref 30.0–36.0)
MCV: 88.9 fL (ref 80.0–100.0)
Platelets: 224 10*3/uL (ref 150–400)
RBC: 3.97 MIL/uL — ABNORMAL LOW (ref 4.22–5.81)
RDW: 12.5 % (ref 11.5–15.5)
WBC: 18.5 10*3/uL — ABNORMAL HIGH (ref 4.0–10.5)
nRBC: 0 % (ref 0.0–0.2)

## 2021-01-26 LAB — GLUCOSE, CAPILLARY
Glucose-Capillary: 117 mg/dL — ABNORMAL HIGH (ref 70–99)
Glucose-Capillary: 119 mg/dL — ABNORMAL HIGH (ref 70–99)
Glucose-Capillary: 120 mg/dL — ABNORMAL HIGH (ref 70–99)
Glucose-Capillary: 129 mg/dL — ABNORMAL HIGH (ref 70–99)
Glucose-Capillary: 140 mg/dL — ABNORMAL HIGH (ref 70–99)
Glucose-Capillary: 142 mg/dL — ABNORMAL HIGH (ref 70–99)
Glucose-Capillary: 167 mg/dL — ABNORMAL HIGH (ref 70–99)

## 2021-01-26 LAB — BASIC METABOLIC PANEL
Anion gap: 9 (ref 5–15)
BUN: 11 mg/dL (ref 8–23)
CO2: 23 mmol/L (ref 22–32)
Calcium: 8.1 mg/dL — ABNORMAL LOW (ref 8.9–10.3)
Chloride: 103 mmol/L (ref 98–111)
Creatinine, Ser: 0.98 mg/dL (ref 0.61–1.24)
GFR, Estimated: >60 mL/min (ref >60)
Glucose, Bld: 139 mg/dL — ABNORMAL HIGH (ref 70–99)
Potassium: 3.7 mmol/L (ref 3.5–5.1)
Sodium: 135 mmol/L (ref 135–145)

## 2021-01-26 LAB — TRIGLYCERIDES: Triglycerides: 91 mg/dL (ref <150)

## 2021-01-26 LAB — PHOSPHORUS: Phosphorus: 1.2 mg/dL — ABNORMAL LOW (ref 2.5–4.6)

## 2021-01-26 LAB — MAGNESIUM: Magnesium: 2.2 mg/dL (ref 1.7–2.4)

## 2021-01-26 LAB — SODIUM
Sodium: 137 mmol/L (ref 135–145)
Sodium: 140 mmol/L (ref 135–145)
Sodium: 139 mmol/L (ref 135–145)

## 2021-01-26 MED ORDER — ATORVASTATIN CALCIUM 80 MG PO TABS
80.0000 mg | ORAL_TABLET | Freq: Every day | ORAL | Status: DC
  Administered 2021-01-27 – 2021-02-14 (×19): 80 mg
  Filled 2021-01-26 (×19): qty 1

## 2021-01-26 MED ORDER — SODIUM CHLORIDE 0.9 % IV SOLN: INTRAVENOUS | Status: DC

## 2021-01-26 MED ORDER — PROSOURCE TF PO LIQD
45.0000 mL | Freq: Three times a day (TID) | ORAL | Status: DC
  Administered 2021-01-26 – 2021-02-14 (×57): 45 mL
  Filled 2021-01-26 (×51): qty 45

## 2021-01-26 MED ORDER — LABETALOL HCL 5 MG/ML IV SOLN
5.0000 mg | INTRAVENOUS | Status: DC | PRN
  Administered 2021-01-26 – 2021-01-28 (×8): 20 mg via INTRAVENOUS
  Filled 2021-01-26 (×9): qty 4

## 2021-01-26 MED ORDER — SODIUM CHLORIDE 3 % IV SOLN
INTRAVENOUS | Status: DC
  Filled 2021-01-26 (×2): qty 500

## 2021-01-26 MED ORDER — OSMOLITE 1.5 CAL PO LIQD
1000.0000 mL | ORAL | Status: DC
  Administered 2021-01-26 – 2021-02-06 (×9): 1000 mL
  Filled 2021-01-26 (×4): qty 1000

## 2021-01-26 NOTE — Consult Note (Signed)
Physical Medicine and Rehabilitation Consult Reason for Consult: Left-sided weakness and facial droop with dysarthria Referring Physician: Dr.Xu  HPI: Leonard [name] is a 65 y.o. right-handed male with unremarkable past medical history.  History taken from chart review due to lethargy.  Patient lives with spouse independent prior to admission. Adult son lives nearby.  1 level home 4 steps to entry.  PT presented on 01/24/2021 with acute left hemiparesis, dysarthria, and facial droop.  Noted blood pressure severely elevated at 232/124.  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  Patient underwent revascularization per interventional radiology.  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  No hemorrhage or mass-effect.  Punctate foci of acute ischemia in the right temporal lobe and right thalamus.  MRA unremarkable.  Echocardiogram with ejection fraction of 60 5%, no wall motion abnormalities.  Currently maintained on aspirin for CVA prophylaxis as well as Brilinta.  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  Patient is n.p.o. with alternative means of nutritional support.  Cleviprex initiated for blood pressure control.  Therapy evaluations completed due to patient decreased functional ability left hemiparesis recommendations of physical medicine rehab consult.  Review of Systems  Unable to perform ROS: Mental acuity   PMH: None Past Surgical History:  Procedure Laterality Date  . RADIOLOGY WITH ANESTHESIA N/A 01/24/2021   Procedure: IR WITH ANESTHESIA;  Surgeon: Julieanne Cotton, MD;  Location: [facility] OR;  Service:  Radiology;  Laterality: N/A;   No pertinent family history of premature CVA Social History:  has no history on file for tobacco use, alcohol use, and drug use.,  Unable to obtain from patient Allergies: No Known Allergies Medications Prior to Admission  Medication Sig Dispense Refill  . acetaminophen (TYLENOL) 500 MG tablet Take 1,000 mg by mouth every 6 (six) hours as needed for mild pain.    Marland Kitchen aspirin EC 81 MG tablet Take 81 mg by mouth daily. Swallow whole.      Home: Home Living Family/patient expects to be discharged to:: Private residence Living Arrangements: Spouse/significant other Available Help at Discharge: Family,Available 24 hours/day Type of Home: House Home Access: Stairs to enter [employer] of Steps: 4-5 Entrance Stairs-Rails: None Home Layout: One level Bathroom Shower/Tub: Engineer, manufacturing systems: Standard Home Equipment: None Additional Comments: Wife works at a Programme researcher, broadcasting/film/video. Adult son lives nearby.  Lives With: Spouse  Functional History: Prior Function Level of Independence: Independent Comments: Pt is retired but stays busy. Pt used to work as a Teaching laboratory technician. Functional Status:  Mobility: Bed Mobility Overal bed mobility: Needs Assistance Bed Mobility: Supine to Sit,Sit to Supine Supine to sit: Mod assist,+2 for physical assistance,+2 for safety/equipment Sit to supine: Max assist,+2 for physical assistance,+2 for safety/equipment General bed mobility comments: Provided verbal and tactile cues to bring legs to L EOB, but pt pulled legs posteriorly and began to bring them off R EOB instead, needing assistance to block and redirect the legs and bring off L side. After extended period, pt did initiate transition of trunk to sit, but needed modAx2 to complete and steady. Transfers Overall transfer level: Needs assistance Equipment used: 2 person hand held  assist Transfers: Sit to/from Stand Sit to Stand: Min assist,+2 physical  assistance,+2 safety/equipment General transfer comment: Pt impulsively coming to stand multiple times throughout session with bil HHA minAx2 to steady. Ambulation/Gait Ambulation/Gait assistance: Min assist,+2 physical assistance,+2 safety/equipment Gait Distance (Feet): 10 Feet (~5 ft anteriorly then ~5 ft posteriorly) Assistive device: 2 person hand held assist Gait Pattern/deviations: Step-through pattern,Decreased step length - left,Decreased step length - right,Decreased stride length,Decreased dorsiflexion - left,Shuffle,Trunk flexed,Decreased stance time - left,Decreased weight shift to left General Gait Details: Pt with R arm around therapist on one side and HHA on L side for steadying with minAx2. Pt benefiting from visual cues to step on therapist's foot to progress gait and step length. Pt ambulates slowly and unsteadily with short, shuffling steps, especially with the L. Decreased L stance time and weight shift also, needing cues to facilitate at times. Gait velocity: reduced Gait velocity interpretation: <1.31 ft/sec, indicative of household ambulator    ADL:    Cognition: Cognition Overall Cognitive Status: Impaired/Different from baseline Arousal/Alertness: Lethargic Orientation Level: Oriented to person Cognition Arousal/Alertness: Lethargic Behavior During Therapy: Impulsive,Flat affect Overall Cognitive Status: Impaired/Different from baseline Area of Impairment: Attention,Memory,Following commands,Safety/judgement,Awareness,Problem solving Current Attention Level: Selective Memory: Decreased short-term memory Following Commands: Follows one step commands inconsistently,Follows one step commands with increased time Safety/Judgement: Decreased awareness of safety,Decreased awareness of deficits Awareness: Intellectual Problem Solving: Slow processing,Decreased initiation,Difficulty sequencing,Requires verbal cues,Requires tactile cues General Comments: Pt with flat  affect but likes to joke around with therapists, has a good sense of humor. Pt needing extra time to process and respond to cues for L limb movements, but inconsistent on whether he responds or not. Pt able to rotate head to L but not cross midline with eyes to look to L and displays some L side neglect. Pt with poor spatial awareness, initially trying to exit legs on R side of bed despite max cues to go to L, needing physical directing to correct. As pt fatigued, his awareness decreased as he would repeatedly transfer sit <> stand on EOB impulsively and then indicate he was going to walk anteriorly to go to his bed even though he was already at it.  Blood pressure 137/70, pulse 76, temperature 99 F (37.2 C), temperature source Axillary, resp. rate (!) 25, height 5\' 9"  (1.753 m), weight 82.6 kg, SpO2 95 %. Physical Exam Vitals reviewed.  Constitutional:      General: He is not in acute distress.    Appearance: He is normal weight. He is not ill-appearing.     Comments: Lethargic  HENT:     Head: Normocephalic and atraumatic.     Comments: + NG    Nose: Nose normal.  Eyes:     General:        Right eye: No discharge.        Left eye: No discharge.     Comments: Will not open eyes, including to painful stimuli  Cardiovascular:     Rate and Rhythm: Normal rate and regular rhythm.  Pulmonary:     Effort: Pulmonary effort is normal. No respiratory distress.     Breath sounds: No stridor.  Abdominal:     General: Abdomen is flat. Bowel sounds are normal. There is no distension.  Musculoskeletal:     Cervical back: Normal range of motion and neck supple.     Comments: No edema or tenderness in extremities  Skin:    General: Skin is warm and dry.  Neurological:  Comments: Lethargic Motor exam limited due to lethargy, but seen spontaneously moving right upper extremity  Psychiatric:     Comments: Unable to assess due to lethargy     Results for orders placed or performed during the  hospital encounter of 01/24/21 (from the past 24 hour(s))  Glucose, capillary     Status: Abnormal   Collection Time: 01/25/21  7:45 PM  Result Value Ref Range   Glucose-Capillary 180 (H) 70 - 99 mg/dL  Glucose, capillary     Status: Abnormal   Collection Time: 01/26/21 12:19 AM  Result Value Ref Range   Glucose-Capillary 167 (H) 70 - 99 mg/dL  Glucose, capillary     Status: Abnormal   Collection Time: 01/26/21  3:11 AM  Result Value Ref Range   Glucose-Capillary 129 (H) 70 - 99 mg/dL  Triglycerides     Status: None   Collection Time: 01/26/21  4:41 AM  Result Value Ref Range   Triglycerides 91 <150 mg/dL  CBC     Status: Abnormal   Collection Time: 01/26/21  4:41 AM  Result Value Ref Range   WBC 18.5 (H) 4.0 - 10.5 K/uL   RBC 3.97 (L) 4.22 - 5.81 MIL/uL   Hemoglobin 11.8 (L) 13.0 - 17.0 g/dL   HCT 40.9 (L) 81.1 - 91.4 %   MCV 88.9 80.0 - 100.0 fL   MCH 29.7 26.0 - 34.0 pg   MCHC 33.4 30.0 - 36.0 g/dL   RDW 78.2 95.6 - 21.3 %   Platelets 224 150 - 400 K/uL   nRBC 0.0 0.0 - 0.2 %  Basic metabolic panel     Status: Abnormal   Collection Time: 01/26/21  4:41 AM  Result Value Ref Range   Sodium 135 135 - 145 mmol/L   Potassium 3.7 3.5 - 5.1 mmol/L   Chloride 103 98 - 111 mmol/L   CO2 23 22 - 32 mmol/L   Glucose, Bld 139 (H) 70 - 99 mg/dL   BUN 11 8 - 23 mg/dL   Creatinine, Ser 0.86 0.61 - 1.24 mg/dL   Calcium 8.1 (L) 8.9 - 10.3 mg/dL   GFR, Estimated >57 >84 mL/min   Anion gap 9 5 - 15  Sodium     Status: None   Collection Time: 01/26/21  7:26 AM  Result Value Ref Range   Sodium 137 135 - 145 mmol/L  Glucose, capillary     Status: Abnormal   Collection Time: 01/26/21  7:32 AM  Result Value Ref Range   Glucose-Capillary 117 (H) 70 - 99 mg/dL  Glucose, capillary     Status: Abnormal   Collection Time: 01/26/21 11:24 AM  Result Value Ref Range   Glucose-Capillary 119 (H) 70 - 99 mg/dL  Sodium     Status: None   Collection Time: 01/26/21 12:39 PM  Result Value Ref  Range   Sodium 140 135 - 145 mmol/L   DG Abd 1 View  Result Date: 01/25/2021 CLINICAL DATA:  OG tube placement EXAM: ABDOMEN - 1 VIEW COMPARISON:  None. FINDINGS: Esophageal tube side-port at or slightly above GE junction, suggest further advancement by 5-10 cm for more optimal positioning. Nonobstructed gas pattern. IMPRESSION: Esophageal tube side-port at or slightly above the GE junction, suggest further advancement by 5-10 cm for more optimal positioning. These results will be called to the ordering clinician or representative by the Radiologist Assistant, and communication documented in the PACS or [employer]. Electronically Signed   By: Adrian Prows.D.  On: 01/25/2021 20:45   CT HEAD WO CONTRAST  Result Date: 01/26/2021 CLINICAL DATA:  Stroke follow-up EXAM: CT HEAD WITHOUT CONTRAST TECHNIQUE: Contiguous axial images were obtained from the base of the skull through the vertex without intravenous contrast. COMPARISON:  Head CT 01/24/2021 FINDINGS: Brain: Large area of hypoattenuation at the site of known right MCA territory infarct. There is a focus of parenchymal hyperdensity at the posterior aspect of the infarct site. There is a small amount of subarachnoid hyperdensity over the right frontal convexity. Vascular: No hyperdense vessel or unexpected calcification. Skull: Normal. Negative for fracture or focal lesion. Sinuses/Orbits: No acute finding. Other: None. IMPRESSION: 1. Large area of hypoattenuation at the site of known right MCA territory infarct. Small focus of parenchymal hyperdensity at the posterior aspect of the infarct site Peckman indicate a small amount of petechial hemorrhage or contrast extravasation from earlier procedure. 2. Small amount of subarachnoid hyperdensity over the right frontal convexity, either subarachnoid blood or contrast staining. Electronically Signed   By: Deatra Robinson M.D.   On: 01/26/2021 01:19   MR ANGIO HEAD WO CONTRAST  Result Date:  01/25/2021 CLINICAL DATA:  Left hemiplegia and left facial droop EXAM: MRI HEAD WITHOUT CONTRAST MRA HEAD WITHOUT CONTRAST TECHNIQUE: Multiplanar, multiecho pulse sequences of the brain and surrounding structures were obtained without intravenous contrast. Angiographic images of the head were obtained using MRA technique without contrast. COMPARISON:  None. FINDINGS: MRI HEAD FINDINGS Brain: Large acute infarct of the right MCA territory involving the frontal operculum, insula and basal ganglia. There are punctate foci of acute ischemia in the right temporal lobe and right thalamus. Vascular: Major flow voids are preserved. Skull and upper cervical spine: Normal calvarium and skull base. Visualized upper cervical spine and soft tissues are normal. Sinuses/Orbits:No paranasal sinus fluid levels or advanced mucosal thickening. No mastoid or middle ear effusion. Normal orbits. MRA HEAD FINDINGS POSTERIOR CIRCULATION: --Vertebral arteries: Normal --Inferior cerebellar arteries: Normal. --Basilar artery: Normal. --Superior cerebellar arteries: Normal. --Posterior cerebral arteries: Normal. ANTERIOR CIRCULATION: --Intracranial internal carotid arteries: Normal. --Anterior cerebral arteries (ACA): Normal. --Middle cerebral arteries (MCA): Normal. ANATOMIC VARIANTS: None IMPRESSION: 1. 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 No hemorrhage or mass effect. 2. Punctate foci of acute ischemia in the right temporal lobe and right thalamus. 3. Normal intracranial MRA. Electronically Signed   By: Deatra Robinson M.D.   On: 01/25/2021 23:32   MR BRAIN WO CONTRAST  Result Date: 01/25/2021 CLINICAL DATA:  Left hemiplegia and left facial droop EXAM: MRI HEAD WITHOUT CONTRAST MRA HEAD WITHOUT CONTRAST TECHNIQUE: Multiplanar, multiecho pulse sequences of the brain and surrounding structures were obtained without intravenous contrast. Angiographic images of the head were  obtained using MRA technique without contrast. COMPARISON:  None. FINDINGS: MRI HEAD FINDINGS Brain: Large acute infarct of the right MCA territory involving the frontal operculum, insula and basal ganglia. There are punctate foci of acute ischemia in the right temporal lobe and right thalamus. Vascular: Major flow voids are preserved. Skull and upper cervical spine: Normal calvarium and skull base. Visualized upper cervical spine and soft tissues are normal. Sinuses/Orbits:No paranasal sinus fluid levels or advanced mucosal thickening. No mastoid or middle ear effusion. Normal orbits. MRA HEAD FINDINGS POSTERIOR CIRCULATION: --Vertebral arteries: Normal --Inferior cerebellar arteries: Normal. --Basilar artery: Normal. --Superior cerebellar arteries: Normal. --Posterior cerebral arteries: Normal. ANTERIOR CIRCULATION: --Intracranial internal carotid arteries: Normal. --Anterior cerebral arteries (ACA): Normal. --Middle cerebral arteries (MCA): Normal. ANATOMIC VARIANTS:  None IMPRESSION: 1. 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 No hemorrhage or mass effect. 2. Punctate foci of acute ischemia in the right temporal lobe and right thalamus. 3. Normal intracranial MRA. Electronically Signed   By: Deatra Robinson M.D.   On: 01/25/2021 23:32   CT CEREBRAL PERFUSION W CONTRAST  Result Date: 01/24/2021 CLINICAL DATA:  Left-sided deficits EXAM: CT ANGIOGRAPHY HEAD AND NECK CT PERFUSION BRAIN TECHNIQUE: Multidetector CT imaging of the head and neck was performed using the standard protocol during bolus administration of intravenous contrast. Multiplanar CT image reconstructions and MIPs were obtained to evaluate the vascular anatomy. Carotid stenosis measurements (when applicable) are obtained utilizing NASCET criteria, using the distal internal carotid diameter as the denominator. Multiphase CT imaging of the brain was performed following IV bolus contrast  injection. Subsequent parametric perfusion maps were calculated using RAPID software. CONTRAST:  OMNIPAQUE IOHEXOL 350 MG/ML SOLN COMPARISON:  None. FINDINGS: CTA NECK FINDINGS SKELETON: There is no bony spinal canal stenosis. No lytic or blastic lesion. OTHER NECK: Normal pharynx, larynx and major salivary glands. No cervical lymphadenopathy. Unremarkable thyroid gland. UPPER CHEST: No pneumothorax or pleural effusion. No nodules or masses. AORTIC ARCH: There is calcific atherosclerosis of the aortic arch. There is no aneurysm, dissection or hemodynamically significant stenosis of the visualized portion of the aorta. Conventional 3 vessel aortic branching pattern. The visualized proximal subclavian arteries are widely patent. 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 The right common and external carotid arteries are normal. LEFT CAROTID SYSTEM: No dissection, occlusion or aneurysm. Mild atherosclerotic calcification at the carotid bifurcation without hemodynamically significant stenosis. VERTEBRAL ARTERIES: Left dominant configuration. Both origins are clearly patent. There is no dissection, occlusion or flow-limiting stenosis to the skull base (V1-V3 segments). CTA HEAD FINDINGS POSTERIOR CIRCULATION: --Vertebral arteries: Normal V4 segments. --Inferior cerebellar arteries: Normal. --Basilar artery: Normal. --Superior cerebellar arteries: Normal. --Posterior cerebral arteries (PCA): Normal. ANTERIOR CIRCULATION: --Intracranial internal carotid arteries: Normal. --Anterior cerebral arteries (ACA): Normal. Both A1 segments are present. Patent anterior communicating artery (a-comm). --Middle cerebral arteries (MCA): Complete occlusion of the right MCA at its origin. There is no collateral flow demonstrated. VENOUS SINUSES: As permitted by contrast timing, patent. ANATOMIC VARIANTS: None Review of the MIP images confirms the above findings. CT Brain Perfusion Findings: ASPECTS: 7 CBF (<30%)  Volume: 68mL Perfusion (Tmax>6.0s) volume: 2 L68mL Mismatch Volume: Infarction Location:Right MCA territory IMPRESSION: 1. 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 2. 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 3. 68 mL right MCA territory core infarct with 133 mL area of ischemic penumbra. Critical Value/emergent results were called by telephone at the time of interpretation on 01/24/2021 at 11:24 pm to provider ERIC [facility] , who verbally acknowledged these results. Aortic Atherosclerosis (ICD10-I70.0). Electronically Signed   By: Deatra Robinson M.D.   On: 01/24/2021 23:34   DG CHEST PORT 1 VIEW  Result Date: 01/25/2021 CLINICAL DATA:  Intubation. EXAM: PORTABLE CHEST 1 VIEW COMPARISON:  No prior. FINDINGS: Endotracheal tube and NG tube in good anatomic position. Cardiomegaly. No pulmonary venous congestion. Low lung volumes with bibasilar atelectasis. No pleural effusion or pneumothorax. IMPRESSION: 1.  Endotracheal tube and NG tube in good anatomic position. 2.  Cardiomegaly.  No pulmonary venous congestion. 3.  Low lung volumes with bibasilar atelectasis. Electronically Signed   By: Maisie Fus  Register   On: 01/25/2021 05:11   ECHOCARDIOGRAM COMPLETE  Result Date: 01/25/2021    ECHOCARDIOGRAM REPORT   Patient Name:   Gerrit FriendsROBERT M Cauthon Date of Exam: 01/25/2021 Medical Rec #:  [id number]    Height:       69.0 in Accession #:    [id number]   Weight:       182.1 lb Date of Birth:  Jan 25, 1956   BSA:          1.985 m Patient Age:    64 years     BP:           117/59 mmHg Patient Gender: M            HR:           75 bpm. Exam Location:  Inpatient Procedure: 2D Echo, Color Doppler and Cardiac Doppler Indications:    Stroke I63.9  History:        Patient has no prior history of Echocardiogram examinations.  Sonographer:    Elmarie Shileyiffany Dance Referring Phys: [id number] ERIC LINDZEN IMPRESSIONS  1. Left ventricular ejection  fraction, by estimation, is 60 to 65%. The left ventricle has normal function. The left ventricle has no regional wall motion abnormalities. There is mild left ventricular hypertrophy. Left ventricular diastolic parameters were normal.  2. Right ventricular systolic function is normal. The right ventricular size is normal.  3. Left atrial size was moderately dilated.  4. The mitral valve is normal in structure. No evidence of mitral valve regurgitation. No evidence of mitral stenosis.  5. The aortic valve is tricuspid. Aortic valve regurgitation is not visualized. No aortic stenosis is present.  6. The inferior vena cava is normal in size with greater than 50% respiratory variability, suggesting right atrial pressure of 3 mmHg. FINDINGS  Left Ventricle: Left ventricular ejection fraction, by estimation, is 60 to 65%. The left ventricle has normal function. The left ventricle has no regional wall motion abnormalities. The left ventricular internal cavity size was normal in size. There is  mild left ventricular hypertrophy. Left ventricular diastolic parameters were normal. Right Ventricle: The right ventricular size is normal. No increase in right ventricular wall thickness. Right ventricular systolic function is normal. Left Atrium: Left atrial size was moderately dilated. Right Atrium: Right atrial size was normal in size. Pericardium: There is no evidence of pericardial effusion. Mitral Valve: The mitral valve is normal in structure. No evidence of mitral valve regurgitation. No evidence of mitral valve stenosis. Tricuspid Valve: The tricuspid valve is normal in structure. Tricuspid valve regurgitation is not demonstrated. No evidence of tricuspid stenosis. Aortic Valve: The aortic valve is tricuspid. Aortic valve regurgitation is not visualized. No aortic stenosis is present. Pulmonic Valve: The pulmonic valve was normal in structure. Pulmonic valve regurgitation is not visualized. No evidence of pulmonic  stenosis. Aorta: The aortic root is normal in size and structure. Venous: The inferior vena cava is normal in size with greater than 50% respiratory variability, suggesting right atrial pressure of 3 mmHg. IAS/Shunts: No atrial level shunt detected by color flow Doppler.  LEFT VENTRICLE PLAX 2D LVIDd:         5.20 cm Diastology LVIDs:         3.10 cm LV e' medial:    5.22 cm/s LV PW:         1.40 cm LV E/e' medial:  13.2 LV IVS:        1.40 cm LV e' lateral:   8.16 cm/s  LV E/e' lateral: 8.5  RIGHT VENTRICLE             IVC RV Basal diam:  1.90 cm     IVC diam: 2.20 cm RV S prime:     13.10 cm/s TAPSE (M-mode): 1.9 cm LEFT ATRIUM             Index       RIGHT ATRIUM           Index LA diam:        4.60 cm 2.32 cm/m  RA Area:     10.90 cm LA Vol (A2C):   44.1 ml 22.21 ml/m RA Volume:   18.40 ml  9.27 ml/m LA Vol (A4C):   45.2 ml 22.77 ml/m LA Biplane Vol: 46.6 ml 23.47 ml/m  AORTIC VALVE LVOT Vmax:   129.00 cm/s LVOT Vmean:  89.700 cm/s LVOT VTI:    0.311 m  AORTA Ao Asc diam: 3.40 cm MITRAL VALVE MV Area (PHT): 3.08 cm     SHUNTS MV Decel Time: 246 msec     Systemic VTI: 0.31 m MV E velocity: 69.00 cm/s MV A velocity: 105.00 cm/s MV E/A ratio:  0.66 Charlton Haws MD Electronically signed by Charlton Haws MD Signature Date/Time: 01/25/2021/1:07:57 PM    Final    CT HEAD CODE STROKE WO CONTRAST  Result Date: 01/24/2021 CLINICAL DATA:  Code stroke.  Slurred speech and left-sided deficits EXAM: CT HEAD WITHOUT CONTRAST TECHNIQUE: Contiguous axial images were obtained from the base of the skull through the vertex without intravenous contrast. COMPARISON:  None. FINDINGS: Brain: There is no mass, hemorrhage or extra-axial collection. The size and configuration of the ventricles and extra-axial CSF spaces are normal. There is loss of gray-white differentiation within the right insula and mid MCA territory. Vascular: Hyperdense appearance of the right MCA. Skull: The visualized skull base,  calvarium and extracranial soft tissues are normal. Sinuses/Orbits: No fluid levels or advanced mucosal thickening of the visualized paranasal sinuses. No mastoid or middle ear effusion. The orbits are normal. ASPECTS [facility] Stroke Program Early CT Score) - Ganglionic level infarction (caudate, lentiform nuclei, internal capsule, insula, M1-M3 cortex): 5 - Supraganglionic infarction (M4-M6 cortex): 2 Total score (0-10 with 10 being normal): 7 IMPRESSION: 1. 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 2. Hyperdense appearance of the right MCA. 3. ASPECTS is 7. These results were called by telephone at the time of interpretation on 01/24/2021 at 10:58 pm to provider Caryl Pina, who verbally acknowledged these results. Electronically Signed   By: Deatra Robinson M.D.   On: 01/24/2021 22:58   VAS US CAROTID  Result Date: 01/25/2021 Carotid Arterial Duplex Study Indications:       Right stent and Check post-op patency of RT ICA stent. Other Factors:     No medical history on file. Limitations        Today's exam was limited due to the post surgical status of                    the patient and tissue properties. Comparison Study:  No previous exams Performing Technologist: Ernestene Mention  Examination Guidelines: A complete evaluation includes B-mode imaging, spectral Doppler, color Doppler, and power Doppler as needed of all accessible portions of each vessel. Bilateral testing is considered an integral part of a complete examination. Limited examinations for reoccurring indications Ptacek be performed as noted.  Right Carotid Findings: +----------+--------+--------+--------+------------------+------------------+  PSV cm/sEDV cm/sStenosisPlaque DescriptionComments           +----------+--------+--------+--------+------------------+------------------+ CCA Prox  91      14                                intimal thickening  +----------+--------+--------+--------+------------------+------------------+ CCA Distal83      11              hypoechoic        intimal thickening +----------+--------+--------+--------+------------------+------------------+ ECA       108     0                                                    +----------+--------+--------+--------+------------------+------------------+ +----------+--------+-------+----------------+-------------------+           PSV cm/sEDV cmsDescribe        Arm Pressure (mmHG) +----------+--------+-------+----------------+-------------------+ [id number]            Multiphasic, WNL                    +----------+--------+-------+----------------+-------------------+ +---------+--------+--+--------+-+---------+ VertebralPSV cm/s48EDV cm/s7Antegrade +---------+--------+--+--------+-+---------+  Right Stent(s): +---------------+---+--+-------------++-----------------------------+ Prox to Stent  10918<50% stenosis                              +---------------+---+--+-------------++-----------------------------+ Proximal Stent 14220<50% stenosis                              +---------------+---+--+-------------++-----------------------------+ Mid Stent      12426<50% stenosis                              +---------------+---+--+-------------++-----------------------------+ Distal Stent   17531<50% stenosis                              +---------------+---+--+-------------++-----------------------------+ Distal to Stent13436<50% stenosisDifficult to see end of stent +---------------+---+--+-------------++-----------------------------+    Summary: Right Carotid: The extracranial vessels were near-normal with only minimal wall                thickening or plaque. The ICA stent appears patent in it's                entirety. Vertebrals:  Right vertebral artery demonstrates antegrade flow. Subclavians: Normal flow hemodynamics were  seen in the right subclavian artery. *See table(s) above for measurements and observations.  Electronically signed by Coral Else MD on 01/25/2021 at 9:26:22 PM.    Final    CT ANGIO HEAD CODE STROKE  Result Date: 01/24/2021 CLINICAL DATA:  Left-sided deficits EXAM: CT ANGIOGRAPHY HEAD AND NECK CT PERFUSION BRAIN TECHNIQUE: Multidetector CT imaging of the head and neck was performed using the standard protocol during bolus administration of intravenous contrast. Multiplanar CT image reconstructions and MIPs were obtained to evaluate the vascular anatomy. Carotid stenosis measurements (when applicable) are obtained utilizing NASCET criteria, using the distal internal carotid diameter as the denominator. Multiphase CT imaging of the brain was performed following IV bolus contrast injection. Subsequent parametric perfusion maps were calculated using RAPID software. CONTRAST:  OMNIPAQUE IOHEXOL 350 MG/ML SOLN COMPARISON:  None. FINDINGS: CTA NECK FINDINGS SKELETON: There is no bony  spinal canal stenosis. No lytic or blastic lesion. OTHER NECK: Normal pharynx, larynx and major salivary glands. No cervical lymphadenopathy. Unremarkable thyroid gland. UPPER CHEST: No pneumothorax or pleural effusion. No nodules or masses. AORTIC ARCH: There is calcific atherosclerosis of the aortic arch. There is no aneurysm, dissection or hemodynamically significant stenosis of the visualized portion of the aorta. Conventional 3 vessel aortic branching pattern. The visualized proximal subclavian arteries are widely patent. 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 The right common and external carotid arteries are normal. LEFT CAROTID SYSTEM: No dissection, occlusion or aneurysm. Mild atherosclerotic calcification at the carotid bifurcation without hemodynamically significant stenosis. VERTEBRAL ARTERIES: Left dominant configuration. Both origins are clearly patent. There is no dissection, occlusion or  flow-limiting stenosis to the skull base (V1-V3 segments). CTA HEAD FINDINGS POSTERIOR CIRCULATION: --Vertebral arteries: Normal V4 segments. --Inferior cerebellar arteries: Normal. --Basilar artery: Normal. --Superior cerebellar arteries: Normal. --Posterior cerebral arteries (PCA): Normal. ANTERIOR CIRCULATION: --Intracranial internal carotid arteries: Normal. --Anterior cerebral arteries (ACA): Normal. Both A1 segments are present. Patent anterior communicating artery (a-comm). --Middle cerebral arteries (MCA): Complete occlusion of the right MCA at its origin. There is no collateral flow demonstrated. VENOUS SINUSES: As permitted by contrast timing, patent. ANATOMIC VARIANTS: None Review of the MIP images confirms the above findings. CT Brain Perfusion Findings: ASPECTS: 7 CBF (<30%) Volume: 68mL Perfusion (Tmax>6.0s) volume: 2 L3mL Mismatch Volume: Infarction Location:Right MCA territory IMPRESSION: 1. 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 2. 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 3. 68 mL right MCA territory core infarct with 133 mL area of ischemic penumbra. Critical Value/emergent results were called by telephone at the time of interpretation on 01/24/2021 at 11:24 pm to provider ERIC [facility] , who verbally acknowledged these results. Aortic Atherosclerosis (ICD10-I70.0). Electronically Signed   By: Deatra Robinson M.D.   On: 01/24/2021 23:34   CT ANGIO NECK CODE STROKE  Result Date: 01/24/2021 CLINICAL DATA:  Left-sided deficits EXAM: CT ANGIOGRAPHY HEAD AND NECK CT PERFUSION BRAIN TECHNIQUE: Multidetector CT imaging of the head and neck was performed using the standard protocol during bolus administration of intravenous contrast. Multiplanar CT image reconstructions and MIPs were obtained to evaluate the vascular anatomy. Carotid stenosis measurements (when applicable) are obtained  utilizing NASCET criteria, using the distal internal carotid diameter as the denominator. Multiphase CT imaging of the brain was performed following IV bolus contrast injection. Subsequent parametric perfusion maps were calculated using RAPID software. CONTRAST:  OMNIPAQUE IOHEXOL 350 MG/ML SOLN COMPARISON:  None. FINDINGS: CTA NECK FINDINGS SKELETON: There is no bony spinal canal stenosis. No lytic or blastic lesion. OTHER NECK: Normal pharynx, larynx and major salivary glands. No cervical lymphadenopathy. Unremarkable thyroid gland. UPPER CHEST: No pneumothorax or pleural effusion. No nodules or masses. AORTIC ARCH: There is calcific atherosclerosis of the aortic arch. There is no aneurysm, dissection or hemodynamically significant stenosis of the visualized portion of the aorta. Conventional 3 vessel aortic branching pattern. The visualized proximal subclavian arteries are widely patent. 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 The right common and external carotid arteries are normal. LEFT CAROTID SYSTEM: No dissection, occlusion or aneurysm. Mild atherosclerotic calcification at the carotid bifurcation without hemodynamically significant stenosis. VERTEBRAL ARTERIES: Left dominant configuration. Both origins are clearly patent. There is no dissection, occlusion or flow-limiting stenosis to the skull base (V1-V3 segments). CTA HEAD FINDINGS POSTERIOR  CIRCULATION: --Vertebral arteries: Normal V4 segments. --Inferior cerebellar arteries: Normal. --Basilar artery: Normal. --Superior cerebellar arteries: Normal. --Posterior cerebral arteries (PCA): Normal. ANTERIOR CIRCULATION: --Intracranial internal carotid arteries: Normal. --Anterior cerebral arteries (ACA): Normal. Both A1 segments are present. Patent anterior communicating artery (a-comm). --Middle cerebral arteries (MCA): Complete occlusion of the right MCA at its origin. There is no collateral flow demonstrated. VENOUS SINUSES: As  permitted by contrast timing, patent. ANATOMIC VARIANTS: None Review of the MIP images confirms the above findings. CT Brain Perfusion Findings: ASPECTS: 7 CBF (<30%) Volume: 68mL Perfusion (Tmax>6.0s) volume: 2 L50mL Mismatch Volume: Infarction Location:Right MCA territory IMPRESSION: 1. 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 2. 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 3. 68 mL right MCA territory core infarct with 133 mL area of ischemic penumbra. Critical Value/emergent results were called by telephone at the time of interpretation on 01/24/2021 at 11:24 pm to provider ERIC [facility] , who verbally acknowledged these results. Aortic Atherosclerosis (ICD10-I70.0). Electronically Signed   By: Deatra Robinson M.D.   On: 01/24/2021 23:34    Assessment/Plan: Diagnosis: acute infarct right MCA infarct Stroke: Continue secondary stroke prophylaxis and Risk Factor Modification listed below:   Antiplatelet therapy:   Blood Pressure Management:  Continue current medication with prn's with permisive HTN per primary team Statin Agent:   Prediabetes management:   Presumed left sided hemiparesis: fit for orthosis to prevent contractures (resting hand splint for day, wrist cock up splint at night, PRAFO, etc) PT/OT for mobility, ADL training  Motor recovery: Fluoxetine Labs independently reviewed.  Records reviewed and summated above.  1. Does the need for close, 24 hr/day medical supervision in concert with the patient's rehab needs make it unreasonable for this patient to be served in a less intensive setting? Yes Co-Morbidities requiring supervision/potential complications: HTN (monitor and provide prns in accordance with increased physical exertion and pain), tachypnea (monitor RR and O2 Sats with increased physical exertion), leukocytosis (repeat labs, cont to monitor for signs and symptoms of  infection, further workup if indicated), ABLA (repeat labs, consider transfusion if necessary to ensure appropriate perfusion for increased activity tolerance), post-stroke dysphagia (advance diet as tolerated) 2. Due to bladder management, bowel management, safety, skin/wound care, disease management, medication administration, pain management and patient education, does the patient require 24 hr/day rehab nursing? Yes 3. Does the patient require coordinated care of a physician, rehab nurse, therapy disciplines of PT/OT/SLP to address physical and functional deficits in the context of the above medical diagnosis(es)? Yes Addressing deficits in the following areas: balance, endurance, locomotion, strength, transferring, bowel/bladder control, bathing, dressing, feeding, grooming, toileting, cognition, speech, language, swallowing and psychosocial support 4. Can the patient actively participate in an intensive therapy program of at least 3 hrs of therapy per day at least 5 days per week? Potentially 5. The potential for patient to make measurable gains while on inpatient rehab is excellent 6. Anticipated functional outcomes upon discharge from inpatient rehab are supervision and min assist  with PT, supervision and min assist with OT, supervision and min assist with SLP. 7. Estimated rehab length of stay to reach the above functional goals is: 18-21 days. 8. Anticipated discharge destination: TBD 9. Overall Rehab/Functional Prognosis: good  RECOMMENDATIONS: This patient's condition is appropriate for continued rehabilitative care in the following setting: CIR when medically stable and able to tolerate 3 hours of therapy/day if adequate caregiver support available  upon discharge.  Patient has agreed to participate in recommended program. Potentially Note that insurance prior authorization [place] be required for reimbursement for recommended care.  Comment: Rehab Admissions Coordinator to follow up.  I  have personally performed a face to face diagnostic evaluation, including, but not limited to relevant history and physical exam findings, of this patient and developed relevant assessment and plan.  Additionally, I have reviewed and concur with the physician assistant's documentation above.   Maryla Morrow, MD, ABPMR Mcarthur Rossetti Angiulli, PA-C 01/26/2021

## 2021-01-26 NOTE — Evaluation (Signed)
Occupational Therapy Evaluation Patient Details Name: Leonard [name] MRN: [id number] DOB: 08-23-1956 Today's Date: 01/26/2021    History of Present Illness Pt is a 65 y.o. male who presented 4/20 with acute onset L hemiplegia, L facial droop, dysarthria, and L hemineglect. tPA was administered. 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 S/p bil common carotid arteriograms and revascularization of occluded R MCA and R ACA prox and R ICA terminus. ETT 4/20-4/21. No significant PMH.   Clinical Impression   Pt admitted with above. He demonstrates the below listed deficits and will benefit from continued OT to maximize safety and independence with BADLs.  Pt presents to OT with Lt hemiparesis, Lt inattention/neglect, impaired cognition, impaired balance.  He currently requires mod A, overall for ADLs and min A +2 for functional mobility.  PTA, he lived with his wife and was independent with all ADLs.  Recommend CIR level rehab.       Follow Up Recommendations  CIR;Supervision/Assistance - 24 hour    Equipment Recommendations  None recommended by OT    Recommendations for Other Services Rehab consult     Precautions / Restrictions Precautions Precautions: Fall Precaution Comments: L neglect; R wrist restraint      Mobility Bed Mobility Overal bed mobility: Needs Assistance Bed Mobility: Supine to Sit;Sit to Supine     Supine to sit: Mod assist;+2 for physical assistance;+2 for safety/equipment Sit to supine: Max assist;+2 for physical assistance;+2 for safety/equipment   General bed mobility comments: Provided verbal and tactile cues to bring legs to L EOB, but pt pulled legs posteriorly and began to bring them off R EOB instead, needing assistance to block and redirect  the legs and bring off L side. After extended period, pt did initiate transition of trunk to sit, but needed modAx2 to complete and steady.    Transfers Overall transfer level: Needs assistance Equipment used: 2 person hand held assist Transfers: Sit to/from Stand Sit to Stand: Min assist;+2 physical assistance;+2 safety/equipment         General transfer comment: Pt impulsively coming to stand multiple times throughout session with bil HHA minAx2 to steady.    Balance Overall balance assessment: Needs assistance Sitting-balance support: Feet supported;No upper extremity supported Sitting balance-Leahy Scale: Fair Sitting balance - Comments: Pt able to sit statically EOB with min guard-minA for stability and reach moderately off BOS to donn socks with R hand.   Standing balance support: Bilateral upper extremity supported;During functional activity Standing balance-Leahy Scale: Poor Standing balance comment: Reliant on UE support and external assist.                           ADL either performed or assessed with clinical judgement   ADL Overall ADL's : Needs assistance/impaired Eating/Feeding: NPO   Grooming: Wash/dry hands;Wash/dry face;Oral care;Brushing hair;Moderate assistance;Sitting   Upper Body Bathing: Moderate assistance;Sitting   Lower Body Bathing: Moderate assistance;Sit to/from stand   Upper Body Dressing : Maximal assistance;Sitting   Lower Body Dressing: Moderate assistance;Sit to/from stand Lower Body Dressing Details (indicate cue type and reason): Pt able to don socks with mod A, and increased time and effort Toilet Transfer: Minimal assistance;+2 for physical assistance;+2 for safety/equipment;BSC   Toileting- Clothing Manipulation and Hygiene: Maximal assistance;Sit to/from stand  Functional mobility during ADLs: Moderate assistance;+2 for physical assistance;+2 for safety/equipment       Vision Baseline Vision/History: Wears  glasses Wears Glasses: At all times Patient Visual Report: No change from baseline Vision Assessment?: Yes Additional Comments: Pt tracks to midline, but demonstrates difficulty tracking past midline.  He required max cues to locate clock on wall, and was unable to read it correctly.  Does blink to thread on Lt, and turns head to stimulus presented in his Lt periphery     Perception Perception Perception Tested?: Yes Perception Deficits: Inattention/neglect Inattention/Neglect: Does not attend to left visual field;Does not attend to left side of body   Praxis Praxis Praxis tested?: Deficits Deficits: Initiation;Perseveration Praxis-Other Comments: perseveration noted as he fatigued    Pertinent Vitals/Pain Pain Assessment: Faces Faces Pain Scale: No hurt     Hand Dominance Right   Extremity/Trunk Assessment Upper Extremity Assessment Upper Extremity Assessment: LUE deficits/detail LUE Deficits / Details: Pt demonstrates minimal shoulder and elbow movement spontaneously when he was asked to lift UE to remove pillow.  He appears to neglect Lt UE LUE Sensation: decreased proprioception LUE Coordination: decreased fine motor;decreased gross motor   Lower Extremity Assessment Lower Extremity Assessment: Defer to PT evaluation   Cervical / Trunk Assessment Cervical / Trunk Assessment: Normal   Communication Communication Communication: Expressive difficulties (dyarthric)   Cognition Arousal/Alertness: Lethargic Behavior During Therapy: Impulsive;Flat affect Overall Cognitive Status: Impaired/Different from baseline Area of Impairment: Attention;Memory;Following commands;Safety/judgement;Awareness;Problem solving                   Current Attention Level: Selective Memory: Decreased short-term memory Following Commands: Follows one step commands inconsistently;Follows one step commands with increased time Safety/Judgement: Decreased awareness of safety;Decreased  awareness of deficits Awareness: Intellectual Problem Solving: Slow processing;Decreased initiation;Difficulty sequencing;Requires verbal cues;Requires tactile cues General Comments: Pt with flat affect but likes to joke around with therapists, has a good sense of humor. Pt needing extra time to process and respond to cues for L limb movements, but inconsistent on whether he responds or not. Pt able to rotate head to L but not cross midline with eyes to look to L and displays some L side neglect. Pt with poor spatial awareness, initially trying to exit legs on R side of bed despite max cues to go to L, needing physical directing to correct. As pt fatigued, his awareness decreased as he would repeatedly transfer sit <> stand on EOB impulsively and then indicate he was going to walk anteriorly to go to his bed even though he was already at it.   General Comments  wife and son present    Exercises     Shoulder Instructions      Home Living Family/patient expects to be discharged to:: Private residence Living Arrangements: Spouse/significant other Available Help at Discharge: Family;Available 24 hours/day Type of Home: House Home Access: Stairs to enter [employer] of Steps: 4-5 Entrance Stairs-Rails: None Home Layout: One level     Bathroom Shower/Tub: Chief Strategy Officer: Standard     Home Equipment: None   Additional Comments: Wife works at a Programme researcher, broadcasting/film/video. Adult son lives nearby.  Lives With: Spouse    Prior Functioning/Environment Level of Independence: Independent        Comments: Pt is retired but stays busy. Pt used to work as a Teaching laboratory technician.        OT Problem List: Decreased strength;Decreased range of motion;Decreased activity tolerance;Impaired balance (sitting and/or standing);Impaired vision/perception;Decreased coordination;Decreased cognition;Decreased  safety awareness;Decreased knowledge of use of DME or AE;Decreased knowledge of  precautions;Impaired UE functional use;Impaired sensation      OT Treatment/Interventions: Self-care/ADL training;Neuromuscular education;DME and/or AE instruction;Therapeutic activities;Cognitive remediation/compensation;Balance training;Patient/family education;Visual/perceptual remediation/compensation;Splinting;Manual therapy    OT Goals(Current goals can be found in the care plan section) Acute Rehab OT Goals Patient Stated Goal: to go back to bed OT Goal Formulation: With patient/family Time For Goal Achievement: 02/09/21 Potential to Achieve Goals: Good ADL Goals Pt Will Perform Eating: with supervision;sitting Pt Will Perform Grooming: with min assist;standing Pt Will Perform Upper Body Bathing: with min assist;sitting Pt Will Perform Lower Body Bathing: with min assist;sit to/from stand Pt Will Perform Upper Body Dressing: with min assist;sitting Pt Will Perform Lower Body Dressing: with min assist;sit to/from stand Pt Will Transfer to Toilet: with min guard assist;ambulating;regular height toilet;bedside commode;grab bars Pt Will Perform Toileting - Clothing Manipulation and hygiene: with min guard assist;sit to/from stand Additional ADL Goal #1: Pt will locate needed ADL items on his Lt with min cues during ADLs Additional ADL Goal #2: Pt will consistently use Lt UE as a gross assist during ADLs  OT Frequency: Min 2X/week   Barriers to D/C:            Co-evaluation              AM-PAC OT "6 Clicks" Daily Activity     Outcome Measure Help from another person eating meals?: Total Help from another person taking care of personal grooming?: A Lot Help from another person toileting, which includes using toliet, bedpan, or urinal?: A Lot Help from another person bathing (including washing, rinsing, drying)?: A Lot Help from another person to put on and taking off regular upper body clothing?: A Lot Help from another person to put on and taking off regular lower body  clothing?: A Lot 6 Click Score: 11   End of Session Equipment Utilized During Treatment: Gait belt Nurse Communication: Mobility status  Activity Tolerance: Patient tolerated treatment well Patient left: in bed;with call bell/phone within reach;with bed alarm set;with family/visitor present  OT Visit Diagnosis: Unsteadiness on feet (R26.81);Cognitive communication deficit (R41.841) Symptoms and signs involving cognitive functions: Cerebral infarction                Time: 5374-8270 OT Time Calculation (min): 30 min Charges:  OT General Charges $OT Visit: 1 Visit OT Evaluation $OT Eval Moderate Complexity: 1 Mod  Eber Jones., OTR/L Acute Rehabilitation Services Pager [phone] Office [phone]   Jeani Hawking M 01/26/2021, 5:20 PM

## 2021-01-26 NOTE — Progress Notes (Signed)
NAME:  G[place] Espina Nussbaumer, MRN:  [id number], DOB:  1956-06-21, LOS: 1 ADMISSION DATE:  01/24/2021, CONSULTATION DATE:  01/25/21 REFERRING MD:  Dr. Corliss Skains, CHIEF COMPLAINT:  L hemiplegia  History of Present Illness:  65 yo male presented with Lt facial droop, hemiplegia, dysarthria.  BP 232/124 in ER.  He had R MCA/ACA/ICA thrombus retrieval with stent angioplasty.  Intubated for procedure.  Pertinent  Medical History:  None  Significant Hospital Events: Including procedures, antibiotic start and stop dates in addition to other pertinent events   . 4/21 presented with L hemiplegia, s/p intervention and remains intubated; Echo >> EF 60 to 65%, mod LA dilation; MRI brain >> large acute Rt MCA infarct, punctate foci of ischemia Rt temporal lobe and Rt thalamus; extubated. . 4/22 CT head >> large area of hypoattenuation Rt MCA territory; start 3% saline  Interim History / Subjective:  On 3% saline and cleviprex.    Objective   Blood pressure (!) 120/57, pulse 80, temperature (!) 100.9 F (38.3 C), temperature source Oral, resp. rate 19, height 5\' 9"  (1.753 m), weight 82.6 kg, SpO2 98 %.        Intake/Output Summary (Last 24 hours) at 01/26/2021 0744 Last data filed at 01/26/2021 0700 Gross per 24 hour  Intake 1192.1 ml  Output 1325 ml  Net -132.9 ml   Filed Weights   01/24/21 2200  Weight: 82.6 kg    Examination:  General - somnolent Eyes - pupils reactive ENT - no sinus tenderness, no stridor Cardiac - regular rate/rhythm, no murmur Chest - equal breath sounds b/l, no wheezing or rales Abdomen - soft, non tender, + bowel sounds Extremities - no cyanosis, clubbing, or edema Skin - no rashes Neuro - moans with stimulation, moves Rt side with stimulation, Lt side weak, not following commands   Labs/imaging that I havepersonally reviewed  (right click and "Reselect all SmartList Selections" daily)   CMP Latest Ref Rng & Units 01/26/2021 01/25/2021 01/25/2021  Glucose 70 - 99  mg/dL 01/27/2021) - 850(Y)  BUN 8 - 23 mg/dL 11 - 10  Creatinine 774(J - 1.24 mg/dL 2.87 - 8.67  Sodium 6.72 - 145 mmol/L 135 130(L) 130(L)  Potassium 3.5 - 5.1 mmol/L 3.7 4.0 4.3  Chloride 98 - 111 mmol/L 103 - 100  CO2 22 - 32 mmol/L 23 - 23  Calcium 8.9 - 10.3 mg/dL 8.1(L) - 8.1(L)  Total Protein 6.5 - 8.1 g/dL - - -  Total Bilirubin 0.3 - 1.2 mg/dL - - -  Alkaline Phos 38 - 126 U/L - - -  AST 15 - 41 U/L - - -  ALT 0 - 44 U/L - - -   CBC Latest Ref Rng & Units 01/26/2021 01/25/2021 01/25/2021  WBC 4.0 - 10.5 K/uL 18.5(H) - 12.9(H)  Hemoglobin 13.0 - 17.0 g/dL 11.8(L) 14.6 14.6  Hematocrit 39.0 - 52.0 % 35.3(L) 43.0 42.3  Platelets 150 - 400 K/uL 224 - 239   CBG (last 3)  Recent Labs    01/26/21 0019 01/26/21 0311 01/26/21 0732  GLUCAP 167* 129* 117*    Resolved Hospital Problem list   Post operative respiratory insufficiency  Assessment & Plan:   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 Cerebral edema. - 3% saline started 4/22 - f/u imaging per neurology - continue ASA, brilinta - will eventually need speech assess, PT/OT  Hypertensive emergency.   - goal SBP 120 to 140 per  neuro IR  Hyperlipidemia. - lipitor when able to take pills  Dysphagia. - might need cortrak for feedings and meds; defer to primary team  Leukocytosis. - no other signs of infection - f/u CBC  Best practice (right click and "Reselect all SmartList Selections" daily)  Diet:  NPO Pain/Anxiety/Delirium protocol (if indicated): No VAP protocol (if indicated): Not indicated DVT prophylaxis: SCD and Contraindicated GI prophylaxis: N/A and PPI Glucose control:  SSI No Central venous access:  N/A Arterial line:  N/A Foley:  Yes, and it is still needed Mobility:  bed rest  PT consulted: Yes Last date of multidisciplinary goals of care discussion [pending] Code Status:  full code Disposition: ICU  Critical care time: 35 minutes  Coralyn Helling,  MD [facility] Pulmonary/Critical Care Pager - [phone] 01/26/2021, 8:11 AM

## 2021-01-26 NOTE — Progress Notes (Signed)
Immediately after MRI patient had a decreased in BP (SBP 80s) and became more somnolent. Cleviprex decresed, precedex decreased, pt still remained minimally responsive with sonorous respirations, precedex was turned off around 2330.  New orders were received. Will continue to monitor.

## 2021-01-26 NOTE — Evaluation (Signed)
Physical Therapy Evaluation Patient Details Name: Leonard [name] MRN: [id number] DOB: 08/18/1956 Today's Date: 01/26/2021   History of Present Illness  Pt is a 65 y.o. male who presented 4/20 with acute onset L hemiplegia, L facial droop, dysarthria, and L hemineglect. tPA was administered. 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 S/p bil common carotid arteriograms and revascularization of occluded R MCA and R ACA prox and R ICA terminus. ETT 4/20-4/21. No significant PMH.    Clinical Impression  Pt presents with condition above and deficits mentioned below, see PT Problem List. PTA, he was independent with all ADLs and functional mobility without AD/AE and living with his wife in a single story house with 4-5 STE without rails. 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 Family reports they can provide 24/7 assistance if needed at home. Currently, pt demonstrates deficits in L-sided strength, impaired cognitive status, L-side neglect, dysarthria, decreased activity tolerance, incoordination, and imbalance that place him at high risk for falls and injuries. Pt requiring mod-maxAx2 for bed mobility and minAx2 for transfers and short bedroom distance gait bouts with UE support. Pt is very pleasant and playful and motivated to participate. Pt would greatly benefit from intensive therapy in the CIR setting to maximize his safety and independence with all functional mobility. Will continue to follow acutely.     Follow Up Recommendations CIR;Supervision/Assistance - 24 hour    Equipment Recommendations  Rolling walker with 5" wheels;3in1 (PT)    Recommendations for Other Services Rehab consult     Precautions / Restrictions Precautions Precautions:  Fall Precaution Comments: L neglect; R wrist restraint Restrictions Weight Bearing Restrictions: No      Mobility  Bed Mobility Overal bed mobility: Needs Assistance Bed Mobility: Supine to Sit;Sit to Supine     Supine to sit: Mod assist;+2 for physical assistance;+2 for safety/equipment Sit to supine: Max assist;+2 for physical assistance;+2 for safety/equipment   General bed mobility comments: Provided verbal and tactile cues to bring legs to L EOB, but pt pulled legs posteriorly and began to bring them off R EOB instead, needing assistance to block and redirect the legs and bring off L side. After extended period, pt did initiate transition of trunk to sit, but needed modAx2 to complete and steady.    Transfers Overall transfer level: Needs assistance Equipment used: 2 person hand held assist Transfers: Sit to/from Stand Sit to Stand: Min assist;+2 physical assistance;+2 safety/equipment         General transfer comment: Pt impulsively coming to stand multiple times throughout session with bil HHA minAx2 to steady.  Ambulation/Gait Ambulation/Gait assistance: Min assist;+2 physical assistance;+2 safety/equipment Gait Distance (Feet): 10 Feet (~5 ft anteriorly then ~5 ft posteriorly) Assistive device: 2 person hand held assist Gait Pattern/deviations: Step-through pattern;Decreased step length - left;Decreased step length - right;Decreased stride length;Decreased dorsiflexion - left;Shuffle;Trunk flexed;Decreased stance time - left;Decreased weight shift to left Gait velocity: reduced Gait velocity interpretation: <1.31 ft/sec, indicative of household ambulator General Gait Details: Pt with R arm around therapist on one side and HHA on L side for steadying with minAx2. Pt benefiting from visual cues to step on therapist's foot to progress gait and step length. Pt ambulates slowly and unsteadily with short, shuffling steps, especially with the  L. Decreased L stance time and  weight shift also, needing cues to facilitate at times.  Stairs            Wheelchair Mobility    Modified Rankin (Stroke Patients Only) Modified Rankin (Stroke Patients Only) Pre-Morbid Rankin Score: No symptoms Modified Rankin: Moderately severe disability     Balance Overall balance assessment: Needs assistance Sitting-balance support: Feet supported;No upper extremity supported Sitting balance-Leahy Scale: Fair Sitting balance - Comments: Pt able to sit statically EOB with min guard-minA for stability and reach moderately off BOS to donn socks with R hand.   Standing balance support: Bilateral upper extremity supported;During functional activity Standing balance-Leahy Scale: Poor Standing balance comment: Reliant on UE support and external assist.                             Pertinent Vitals/Pain Pain Assessment: Faces Faces Pain Scale: No hurt Pain Intervention(s): Monitored during session    Home Living Family/patient expects to be discharged to:: Private residence Living Arrangements: Spouse/significant other Available Help at Discharge: Family;Available 24 hours/day Type of Home: House Home Access: Stairs to enter Entrance Stairs-Rails: None Entrance Stairs-Number of Steps: 4-5 Home Layout: One level Home Equipment: None Additional Comments: Wife works at a Programme researcher, broadcasting/film/video. Adult son lives nearby.    Prior Function Level of Independence: Independent         Comments: Pt is retired but stays busy. Pt used to work as a Teaching laboratory technician.     Hand Dominance        Extremity/Trunk Assessment   Upper Extremity Assessment Upper Extremity Assessment: Defer to OT evaluation    Lower Extremity Assessment Lower Extremity Assessment: LLE deficits/detail LLE Deficits / Details: Weakness compared to R noted with functional mobility through decreased step length and foot clearance LLE Sensation: decreased proprioception (did not test formally, but  pt appears to lack awareness of its position, needing assistance to attend to it and correct) LLE Coordination: decreased fine motor;decreased gross motor    Cervical / Trunk Assessment Cervical / Trunk Assessment: Normal  Communication   Communication: Expressive difficulties  Cognition Arousal/Alertness: Lethargic Behavior During Therapy: Impulsive;Flat affect Overall Cognitive Status: Impaired/Different from baseline Area of Impairment: Attention;Memory;Following commands;Safety/judgement;Awareness;Problem solving                   Current Attention Level: Selective Memory: Decreased short-term memory Following Commands: Follows one step commands inconsistently;Follows one step commands with increased time Safety/Judgement: Decreased awareness of safety;Decreased awareness of deficits Awareness: Intellectual Problem Solving: Slow processing;Decreased initiation;Difficulty sequencing;Requires verbal cues;Requires tactile cues General Comments: Pt with flat affect but likes to joke around with therapists, has a good sense of humor. Pt needing extra time to process and respond to cues for L limb movements, but inconsistent on whether he responds or not. Pt able to rotate head to L but not cross midline with eyes to look to L and displays some L side neglect. Pt with poor spatial awareness, initially trying to exit legs on R side of bed despite max cues to go to L, needing physical directing to correct. As pt fatigued, his awareness decreased as he would repeatedly transfer sit <> stand on EOB impulsively and then indicate he was going to walk anteriorly to go to his bed even though he was already at it.      General Comments General comments (skin integrity, edema, etc.): Wife and son present during session.  Exercises     Assessment/Plan    PT Assessment Patient needs continued PT services  PT Problem List Decreased strength;Decreased activity tolerance;Decreased  balance;Decreased mobility;Decreased coordination;Decreased cognition;Decreased knowledge of use of DME;Decreased safety awareness;Impaired sensation       PT Treatment Interventions DME instruction;Gait training;Stair training;Functional mobility training;Therapeutic activities;Therapeutic exercise;Balance training;Neuromuscular re-education;Cognitive remediation;Patient/family education    PT Goals (Current goals can be found in the Care Plan section)  Acute Rehab PT Goals Patient Stated Goal: to go back to bed PT Goal Formulation: With patient/family Time For Goal Achievement: 02/09/21 Potential to Achieve Goals: Good    Frequency Min 4X/week   Barriers to discharge        Co-evaluation PT/OT/SLP Co-Evaluation/Treatment: Yes Reason for Co-Treatment: Necessary to address cognition/behavior during functional activity;For patient/therapist safety;To address functional/ADL transfers PT goals addressed during session: Mobility/safety with mobility;Balance         AM-PAC PT "6 Clicks" Mobility  Outcome Measure Help needed turning from your back to your side while in a flat bed without using bedrails?: A Lot Help needed moving from lying on your back to sitting on the side of a flat bed without using bedrails?: A Lot Help needed moving to and from a bed to a chair (including a wheelchair)?: A Lot Help needed standing up from a chair using your arms (e.g., wheelchair or bedside chair)?: A Little Help needed to walk in hospital room?: A Lot Help needed climbing 3-5 steps with a railing? : Total 6 Click Score: 12    End of Session Equipment Utilized During Treatment: Gait belt Activity Tolerance: Patient tolerated treatment well Patient left: in bed;with call bell/phone within reach;with bed alarm set;with restraints reapplied;with SCD's reapplied Nurse Communication: Mobility status PT Visit Diagnosis: Unsteadiness on feet (R26.81);Other abnormalities of gait and mobility  (R26.89);Muscle weakness (generalized) (M62.81);Difficulty in walking, not elsewhere classified (R26.2);Other symptoms and signs involving the nervous system (R29.898);Hemiplegia and hemiparesis Hemiplegia - Right/Left: Left Hemiplegia - caused by: Cerebral infarction    Time: 7782-4235 PT Time Calculation (min) (ACUTE ONLY): 33 min   Charges:   PT Evaluation $PT Eval Moderate Complexity: 1 Mod          Raymond Gurney, PT, DPT Acute Rehabilitation Services  Pager: [phone] Office: [phone]   Jewel Baize 01/26/2021, 1:55 PM

## 2021-01-26 NOTE — Progress Notes (Addendum)
MRI completed, revealing a medium to large sized early subacute ischemic infarction in the right MCA territory. There is swelling resulting in mass effect on the right lateral ventricle which appears to present some risk of ventricular entrapment.   RN also states that he has been slower to recover than would be expected since Precedex, which had been administered for the MRI, was stopped.   BP 132/61   Pulse 77   Temp 100.2 F (37.9 C) (Oral)   Resp 20   Ht 5\' 9"  (1.753 m)   Wt 82.6 kg   SpO2 94%   BMI 26.89 kg/m    A/P: 65 year old male s/p IV tPA and thrombectomy with stenting for right ICA and MCA occlusions.  - STAT CT head - Starting hypertonic saline.   Electronically signed: Dr. 76  Addendum: - CT head without significant change from MRI.  - Given the initial stages of swelling for potential for ventricular entrapment, a repeat CT head for 1:30 PM has been ordered.   Electronically signed: Dr. Caryl Pina

## 2021-01-26 NOTE — Progress Notes (Addendum)
STROKE TEAM PROGRESS NOTE   INTERVAL HISTORY  Extubated yesterday.  Remained agitated requiring Precedex infusion.  MRI/A last evening which showed large acute infarct of the right MCA territory with cerebral edema with minimal mass effect.  Overnight the patient became somnolent and hypotensive.  Precedex was discontinued and a repeat CT head was obtained which showed large area of hypoattenuation at the site of known R MCA territory infarct.  Small hyperdensity at the posterior aspect of the infarct.  Hypertonic saline started overnight for concern for initial stages of swelling for potential for ventricular entrapment.    Failed swallow eval today.  Patient pulled out NG tube early this morning.  Will need cortrak for medications and tube feeds.   Vitals:   01/26/21 0615 01/26/21 0630 01/26/21 0645 01/26/21 0700  BP: 114/61 (!) 117/53 (!) 127/54 (!) 120/57  Pulse: 88 76 77 80  Resp: (!) 21 19 20 19   Temp:      TempSrc:      SpO2: 98% 98% 97% 98%  Weight:      Height:       CBC:  Recent Labs  Lab 01/24/21 2235 01/24/21 2245 01/25/21 0342 01/25/21 0404 01/26/21 0441  WBC 9.7  --  12.9*  --  18.5*  NEUTROABS 7.5  --  10.3*  --   --   HGB 16.9   < > 14.6 14.6 11.8*  HCT 50.2   < > 42.3 43.0 35.3*  MCV 89.8  --  87.2  --  88.9  PLT 251  --  239  --  224   < > = values in this interval not displayed.   Basic Metabolic Panel:  Recent Labs  Lab 01/25/21 0342 01/25/21 0404 01/26/21 0441 01/26/21 0726  NA 130* 130* 135 137  K 4.3 4.0 3.7  --   CL 100  --  103  --   CO2 23  --  23  --   GLUCOSE 132*  --  139*  --   BUN 10  --  11  --   CREATININE 0.73  --  0.98  --   CALCIUM 8.1*  --  8.1*  --    Lipid Panel:  Recent Labs  Lab 01/25/21 0342 01/26/21 0441  CHOL 195  --   TRIG 156* 91  HDL 38*  --   CHOLHDL 5.1  --   VLDL 31  --   LDLCALC 126*  --    HgbA1c:  Recent Labs  Lab 01/25/21 0342  HGBA1C 5.8*   Urine Drug Screen:  Recent Labs  Lab 01/25/21 0840   LABOPIA NONE DETECTED  COCAINSCRNUR NONE DETECTED  LABBENZ NONE DETECTED  AMPHETMU NONE DETECTED  THCU NONE DETECTED  LABBARB NONE DETECTED    Alcohol Level No results for input(s): ETH in the last 168 hours.  IMAGING past 24 hours CT CEREBRAL PERFUSION W CONTRAST  Result Date: 01/24/2021 IMPRESSION:  1. 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  2. 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  3. 68 mL right MCA territory core infarct with 133 mL area of ischemic penumbra.   DG CHEST PORT 1 VIEW Result Date: 01/25/2021 IMPRESSION:  1.  Endotracheal tube and NG tube in good anatomic position.  2.  Cardiomegaly.  No pulmonary venous congestion.  3.  Low lung volumes with bibasilar atelectasis.     CT  HEAD CODE STROKE WO CONTRAST Result Date: 01/24/2021 IMPRESSION: 1. 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 2. Hyperdense appearance of the right MCA. 3. ASPECTS is 7.    CT ANGIO HEAD/NECK CODE STROKE Result Date: 01/24/2021 IMPRESSION:  1. 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  2. 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 3. 68 mL right MCA territory core infarct with 133 mL area of ischemic penumbra.  MRA head/MRI brain Result Date: 01/26/2021 IMPRESSION: 1. 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 No hemorrhage or mass effect. 2. Punctate foci of acute ischemia in the right temporal lobe and right thalamus. 3. Normal intracranial MRA.  PHYSICAL EXAM HEENT-  [place]/AT Lungs - Respirations unlabored Extremities - Warm and well perfused  Neuro - awak and alert, oriented to first and last name, place only.Moderate to severe dysarthria, followingmostsimple commands.  Able to name most objects.  Mild difficulty with repeating due to cooperation. Right gaze preference able to cross midline but unable to fully track to the left,blinking to visual threat bilaterally right> left. PERRL. Left facial droop. Tongueprotrusion midline. RUE spontaneous (4/5), RLE spontaneous (4/5), LUE 2-/5 LLE spontaneous 4/5, able to wiggle toes bilaterally. Sensation symmetrical bilaterally,  Right FTN intact. Left FTNslow, ataxia present, gait not tested.  ASSESSMENT/PLAN [name] is a 65 y.o. male with no significant PMH presenting with left hemiplegia, left facial droop, and dysarthria.  Was found to have an acute right MCA territory infarct s/p tPA and bilateral common carotid arteriograms followed by right MCA/ACA/ICA thrombus retrieval with stent angioplasty..   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 Per Dr. 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   Code Stroke Acute right MCA territory infarct ASPECTS 7   CTA head & neck: Complete occlusion of the R ICA and R MCA   CT perfusion 68 mL right MCA territory core infarct with 133 mL area of ischemic penumbra.  S/p IR with R MCA TICI3 and R carotid stenting.  MRI: large acute infarct of the right MCA territory involving the       frontal operculum, insula and basal ganglia with hemorrhagenic            conversion   MRA  R MCA patent  Right carotid duplex: ICA stent appears patent  2D Echo: EF 60-65%, mild LVH  LDL 126  HgbA1c 5.8  UDS negative  VTE prophylaxis - SCDs  Diet: NPO  aspirin 81 mg daily prior to admission, now on aspirin 81 mg daily and Brilinta (ticagrelor) 90 mg bid.   Yokum consider 30-day cardiac event monitor as outpatient to rule out A. Fib.  Therapy recommendations:  CIR  Disposition: pending  Carotid occlusion  CTA neck: Complete occlusion of the R ICA   S/p IR with R carotid stenting.  Per Dr.  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  Right carotid duplex: ICA stent appears patent  On aspirin and Brilinta DAPT.  Cerebral Edema ? Chronic hyponatremia  MRI: early subacute ischemic infarction in the right MCA territory. There is swelling resulting in minimal mass effect on the right lateral ventricle.  Na 130->135->140->139  Hold off 3% saline today due to quick elevation of Na in the setting of ? Chronic hyponatermia ->  NS@ 25  Na goal 145-150 for this pt  Repeat CT head in am  Hypertension  Home meds:  none  Cleviprex discontinued due to hypotension with sedation . SBP goal now < 160 given hemorrhagic conversion . Long-term BP goal normotensive  Hyperlipidemia  Home meds:  none  LDL 126, goal < 70  Add Lipitor 80g daily  Continue statin at discharge  Dysphagia  Secondary to stroke  NPO  SLP on board  corpak placed, on TF @ 50cc  Leukocytosis Fever  WBC 9.7-12.9-18.5  Febrile 38.3 today, likely due to aspiration  will continue to monitor for s/s of infection  Other Stroke Risk Factors  Advanced Age >/= 5   Other Active Problems    Hospital day # 1  Lissy Olivencia-Simmons, ACNP-BC Stroke NP  ATTENDING NOTE: I reviewed above note and agree with the assessment and plan. Pt was seen and examined.   Wife and son are at the bedside. Pt extubated yesterday, agitation at night, NG placed and had brilinta but then he pulled off NG tube. Overnight needed precedex for MRI brain which showed large right MCA infarct with hemorrhagic conversion. 3% saline was started. precedex was later d/c'ed due to hypotension.   On exam, pt lethargic but open eyes on voice, able to maintain opening, able to answer orientation questions but very severe dysarthria and hypophonia. However, he was orientated to name and place, intangible words with other questions. Able to name and repeat in dysarthric voice. Follow most simple commands. Right gaze preference but  able to have incomplete left gaze. Blinking to visual threat on the right, inconsistent on the left. Left facial droop, left UE 2-/5, RUE and BLEs 4/5. Sensation subjectively symmetrical, left FTN slow but intact. Gait not tested.  Pt today so far no agitation, cortrak placed and on TF. His Na up to 140 today, however, given his possible chronic hyponatremia, will avoid further Na elevation today, will change 3% to NS. Tomorrow am, Cangemi resume 3% saline to reach goal of 145-150. CT head in am pending. Continue ASA brilinta and statin.  For detailed assessment and plan, please refer to above as I have made changes wherever appropriate.   Marvel Plan, MD PhD Stroke Neurology 01/26/2021 10:52 PM  This patient is critically ill due to large right MCA infarct with hemorrhagic conversion, cerebral edema, dysphagia and at significant risk of neurological worsening, death form recurrent stroke, brain herniation, seizure, sepsis. This patient's care requires constant monitoring of vital signs, hemodynamics, respiratory and cardiac monitoring, review of multiple databases, neurological assessment, discussion with family, other specialists and medical decision making of high complexity. I spent 40 minutes of neurocritical care time in the care of this patient. I had long discussion with wife and son at bedside, updated pt current condition, treatment plan and potential prognosis, and answered all the questions. They expressed understanding and appreciation. I also discussed with Dr. Craige Cotta CCM.     To contact Stroke Continuity provider, please refer to [email or web address]. After hours, contact General Neurology

## 2021-01-26 NOTE — Evaluation (Signed)
Speech Language Pathology Evaluation Patient Details Name: Leonard [name] MRN: [id number] DOB: Sep 09, 1956 Today's Date: 01/26/2021 Time: 1205-1221 SLP Time Calculation (min) (ACUTE ONLY): 16 min  Problem List:  Patient Active Problem List   Diagnosis Date Noted  . Stroke (cerebrum) (HCC) 01/25/2021  . Middle cerebral artery embolism, right 01/25/2021   Past Medical History: History reviewed. No pertinent past medical history. Past Surgical History:  Past Surgical History:  Procedure Laterality Date  . RADIOLOGY WITH ANESTHESIA N/A 01/24/2021   Procedure: IR WITH ANESTHESIA;  Surgeon: Julieanne Cotton, MD;  Location: [facility] OR;  Service: Radiology;  Laterality: N/A;   HPI:  Pt is a 65 y.o. male with no significant PMHx who presented via EMS as a code stroke for acute onset of left hemiplegia, left facial droop, dysarthria and left hemineglect. NIHSS was 18 on admission and tPA was given. 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 Pt s/p  s/p bilateral common carotid arteriograms followed by right MCA/ACA/ICA thrombus retrieval with stent angioplasty 4/21. Pt was intubated for procedure. CT head 4/22: Large area of hypoattenuation at the site of known right MCA territory infarct. Small focus of parenchymal hyperdensity at the posterior aspect of the infarct site Bougher indicate a small amount of petechial hemorrhage or contrast extravasation from procedure. Small amount of subarachnoid hyperdensity over the right frontal convexity, either subarachnoid blood or contrast taining.   Assessment / Plan / Recommendation Clinical Impression  Pt was seen for speech/language/cognition evaluation with his wife and son present. Pt's family reported that the pt is a retired Journalist, newspaper with a high school education. Pt's family denied the pt having any deficits in these areas at baseline. Pt was lethargic at the onset of the  evaluation and his level of alertness waned as the evaluation progressed. The evaluation was ultimately terminated prematurely due to pt's inability to maintain an adequate level of alertness. Pt demonstrated moderate to severe dysarthria characterized by reduced articulatory precision and reduced vocal intensity which together negatively impacted speech intelligibility across levels. Pt exhibited difficulty following commands, answering complex questions correctly and participating in expressive language tasks. However, the impact of his likely impaired cognition and level of alertness of his performance is strongly considered. Skilled SLP services will be initiated to target dysarthria and for further assessment of pt's cognitive-linguistic skills.    SLP Assessment  SLP Recommendation/Assessment: Patient needs continued Speech Lanaguage Pathology Services SLP Visit Diagnosis: Dysarthria and anarthria (R47.1)    Follow Up Recommendations  Inpatient Rehab    Frequency and Duration min 2x/week  2 weeks      SLP Evaluation Cognition  Overall Cognitive Status: Impaired/Different from baseline Arousal/Alertness: Lethargic Orientation Level: Oriented to person       Comprehension  Auditory Comprehension Yes/No Questions: Impaired Basic Biographical Questions:  (2/4) Complex Questions:  (0/4) Commands: Impaired One Step Basic Commands:  (2/5) Two Step Basic Commands:  (1/3) Conversation: Simple Reading Comprehension Reading Status: Not tested    Expression Expression Primary Mode of Expression: Verbal Verbal Expression Automatic Speech: Counting (2/10) Level of Generative/Spontaneous Verbalization: Phrase Naming: Impairment Confrontation:  (1/3)   Oral / Motor  Oral Motor/Sensory Function Overall Oral Motor/Sensory Function: Moderate impairment Facial ROM: Reduced left;Suspected CN VII (facial) dysfunction Facial Symmetry: Abnormal symmetry left;Suspected CN VII (facial)  dysfunction Facial Strength: Reduced left;Suspected CN VII (facial) dysfunction Facial Sensation: Within Functional Limits Lingual Strength: Reduced;Suspected CN XII (hypoglossal) dysfunction Motor Speech Overall  Motor Speech: Impaired Respiration: Impaired Level of Impairment: Phrase Phonation: Low vocal intensity Resonance: Within functional limits Articulation: Impaired Level of Impairment: Word Intelligibility: Intelligibility reduced Word: 50-74% accurate Phrase: 25-49% accurate Sentence: 25-49% accurate Conversation: Not tested Motor Planning: Witnin functional limits Motor Speech Errors: Aware;Consistent   Leonard [name] I. Leonard Clock, MS, CCC-SLP Acute Rehabilitation Services Office number [phone] Pager [phone]                    Leonard [name] 01/26/2021, 2:21 PM

## 2021-01-26 NOTE — Progress Notes (Signed)
Rehab Admissions Coordinator Note:  Patient was screened by Clois Dupes for appropriateness for an Inpatient Acute Rehab Consult per therapy recs..  At this time, we are recommending Inpatient Rehab consult. I will place order per protocol.  Clois Dupes RN MSN 01/26/2021, 2:15 PM  I can be reached at [phone].

## 2021-01-26 NOTE — Progress Notes (Signed)
Initial Nutrition Assessment  DOCUMENTATION CODES:   Not applicable  INTERVENTION:   Initiate tube feeding via Cortrak tube once placed: Osmolite 1.5 at 50 ml/h (1200 ml per day) Prosource TF 45 ml TID  Provides 1920 kcal, 108 gm protein, 912 ml free water daily    NUTRITION DIAGNOSIS:   Inadequate oral intake related to inability to eat as evidenced by NPO status.  GOAL:   Patient will meet greater than or equal to 90% of their needs  MONITOR:   TF tolerance  REASON FOR ASSESSMENT:   Consult Enteral/tube feeding initiation and management  ASSESSMENT:   Pt admitted with acute ischemic R MCA CVA s/p IV tPA and thrombectomy with stenting of R ICA and MCA.   4/22 started on 3%  Pt discussed during ICU rounds and with RN.  Pt pulled his NG early this am. Plan for cortrak placement today.   Spoke with wife and so who are at the bedside. They report no recent weight loss and good appetite. PTA.    Medications reviewed and include:  3% hypertonic saline Cleviprex currently off Labs reviewed:  CBG's: 117-129   NUTRITION - FOCUSED PHYSICAL EXAM:  Flowsheet Row Most Recent Value  Orbital Region No depletion  Upper Arm Region No depletion  Thoracic and Lumbar Region No depletion  Buccal Region No depletion  Temple Region No depletion  Clavicle Bone Region No depletion  Clavicle and Acromion Bone Region No depletion  Scapular Bone Region No depletion  Dorsal Hand No depletion  Patellar Region Mild depletion  Anterior Thigh Region Mild depletion  Posterior Calf Region No depletion  Edema (RD Assessment) None  Hair Reviewed  Eyes Unable to assess  Mouth Unable to assess  Skin Reviewed  Nails Reviewed       Diet Order:   Diet Order            Diet NPO time specified  Diet effective now                 EDUCATION NEEDS:   No education needs have been identified at this time  Skin:  Skin Assessment: Reviewed RN Assessment  Last BM:   unknown  Height:   Ht Readings from Last 1 Encounters:  01/25/21 5\' 9"  (1.753 m)    Weight:   Wt Readings from Last 1 Encounters:  01/24/21 82.6 kg    Ideal Body Weight:  72.7 kg  BMI:  Body mass index is 26.89 kg/m.  Estimated Nutritional Needs:   Kcal:  1900-2100  Protein:  100-120 grams  Fluid:  >1.9 L/day  01/26/21., RD, LDN, CNSC See AMiON for contact information

## 2021-01-26 NOTE — Evaluation (Signed)
Clinical/Bedside Swallow Evaluation Patient Details  Name: Barbara Keng Fennelly MRN: [id number] Date of Birth: 10/08/1955  Today's Date: 01/26/2021 Time: SLP Start Time (ACUTE ONLY): 1149 SLP Stop Time (ACUTE ONLY): 1204 SLP Time Calculation (min) (ACUTE ONLY): 15.73 min  Past Medical History: History reviewed. No pertinent past medical history. Past Surgical History:  Past Surgical History:  Procedure Laterality Date  . RADIOLOGY WITH ANESTHESIA N/A 01/24/2021   Procedure: IR WITH ANESTHESIA;  Surgeon: Julieanne Cotton, MD;  Location: [facility] OR;  Service: Radiology;  Laterality: N/A;   HPI:  Pt is a 65 y.o. male with no significant PMHx who presented via EMS as a code stroke for acute onset of left hemiplegia, left facial droop, dysarthria and left hemineglect. NIHSS was 18 on admission and tPA was given. 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 Pt s/p  s/p bilateral common carotid arteriograms followed by right MCA/ACA/ICA thrombus retrieval with stent angioplasty 4/21. Pt was intubated for procedure. CT head 4/22: Large area of hypoattenuation at the site of known right MCA territory infarct. Small focus of parenchymal hyperdensity at the posterior aspect of the infarct site Bole indicate a small amount of petechial hemorrhage or contrast extravasation from procedure. Small amount of subarachnoid hyperdensity over the right frontal convexity, either subarachnoid blood or contrast taining.   Assessment / Plan / Recommendation Clinical Impression  Pt was seen for bedside swallow evaluation with his wife and son present. All parties denied the pt having a history of dysphagia and reported intake of regular texture solids and thin liquids. Oral mechanism exam was limited due to pt's difficulty following commands; however, left-sided facial weakness and reduced lingual ROM were noted. He demonstrated symptoms of oropharyngeal  dysphagia characterized by prolonged bolus manipulation/formation, reduced bolus awareness, residue in the left lateral sulcus, multiple swallows, and signs of aspiration with thin liquids via cup and spoon. Pt's level of alertness was suboptimal throughout the evaluation and the impact of this on his performance is considered. It is recommended that the pt's NPO status be maintained at this time with allowance of ice chips if pt is adequately alert. Cortrak placement is planned for today and SLP is in agreement with this plan. SLP will follow to assess improvement in swallow function and for instrumental assessment as clinically indicated. SLP Visit Diagnosis: Dysphagia, unspecified (R13.10)    Aspiration Risk  Moderate aspiration risk    Diet Recommendation Ice chips PRN after oral care;NPO except meds   Medication Administration: Via alternative means (or [place] be crushed and given in puree)    Other  Recommendations Oral Care Recommendations: Oral care BID;Oral care prior to ice chip/H20   Follow up Recommendations Inpatient Rehab      Frequency and Duration min 2x/week  2 weeks       Prognosis Prognosis for Safe Diet Advancement: Good Barriers to Reach Goals: Language deficits;Severity of deficits      Swallow Study   General Date of Onset: 01/25/21 HPI: Pt is a 65 y.o. male with no significant PMHx who presented via EMS as a code stroke for acute onset of left hemiplegia, left facial droop, dysarthria and left hemineglect. NIHSS was 18 on admission and tPA was given. 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 Pt s/p  s/p bilateral common carotid arteriograms followed by right MCA/ACA/ICA thrombus retrieval with stent angioplasty 4/21. Pt was  intubated for procedure. CT head 4/22: Large area of hypoattenuation at the site of known right MCA territory infarct. Small focus of parenchymal hyperdensity at the  posterior aspect of the infarct site Baines indicate a small amount of petechial hemorrhage or contrast extravasation from procedure. Small amount of subarachnoid hyperdensity over the right frontal convexity, either subarachnoid blood or contrast taining. Type of Study: Bedside Swallow Evaluation Previous Swallow Assessment: none Diet Prior to this Study: NPO Temperature Spikes Noted: No Respiratory Status: Room air History of Recent Intubation: Yes Length of Intubations (days): 1 days (for procedure) Date extubated: 01/25/21 Behavior/Cognition: Cooperative;Lethargic/Drowsy;Doesn't follow directions;Requires cueing;Distractible Oral Cavity Assessment: Within Functional Limits Oral Care Completed by SLP: No Oral Cavity - Dentition: Adequate natural dentition Vision: Functional for self-feeding Self-Feeding Abilities: Needs assist Patient Positioning: Upright in bed;Postural control adequate for testing Baseline Vocal Quality: Low vocal intensity Volitional Cough: Weak Volitional Swallow: Able to elicit    Oral/Motor/Sensory Function Overall Oral Motor/Sensory Function: Moderate impairment Facial ROM: Reduced left;Suspected CN VII (facial) dysfunction Facial Symmetry: Abnormal symmetry left;Suspected CN VII (facial) dysfunction Facial Strength: Reduced left;Suspected CN VII (facial) dysfunction Facial Sensation: Within Functional Limits Lingual Strength: Reduced;Suspected CN XII (hypoglossal) dysfunction   Ice Chips Ice chips: Impaired Presentation: Spoon Oral Phase Impairments: Poor awareness of bolus   Thin Liquid Thin Liquid: Impaired Presentation: Spoon;Cup Pharyngeal  Phase Impairments: Throat Clearing - Immediate;Cough - Delayed;Cough - Immediate;Multiple swallows    Nectar Thick Nectar Thick Liquid: Not tested   Honey Thick Honey Thick Liquid: Not tested   Puree Puree: Impaired Presentation: Spoon Oral Phase Impairments: Poor awareness of bolus Oral Phase Functional  Implications: Oral residue;Prolonged oral transit Pharyngeal Phase Impairments: Multiple swallows   Solid   Lavra Imler I. Vear Clock, MS, CCC-SLP Acute Rehabilitation Services Office number [phone] Pager [phone] Solid: Not tested      Scheryl Marten 01/26/2021,12:37 PM

## 2021-01-26 NOTE — Procedures (Signed)
Cortrak  Person Inserting Tube:  Joani Cosma, Verdon Cummins, RD Tube Type:  Cortrak - 43 inches Tube Location:  Left nare Initial Placement:  Stomach Secured by: Bridle Technique Used to Measure Tube Placement:  Documented cm marking at nare/ corner of mouth Cortrak Secured At:  68 cm    Cortrak Tube Team Note:  Consult received to place a Cortrak feeding tube.   No x-ray is required. RN Marone begin using tube.    If the tube becomes dislodged please keep the tube and contact the Cortrak team at [email or web address] ([email or web address]) for replacement.  If after hours and replacement cannot be delayed, place a NG tube and confirm placement with an abdominal x-ray.    Eugene Gavia, MS, RD, LDN RD pager number and weekend/on-call pager number located in [place].

## 2021-01-26 NOTE — Progress Notes (Signed)
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  Subjective: Patient awake/alert but with expressive aphasia. Intermittently follows commands.   Allergies: Patient has no known allergies.  Medications: Prior to Admission medications   Medication Sig Start Date End Date Taking? Authorizing Provider  acetaminophen (TYLENOL) 500 MG tablet Take 1,000 mg by mouth every 6 (six) hours as needed for mild pain.   Yes [provider]  aspirin EC 81 MG tablet Take 81 mg by mouth daily. Swallow whole.   Yes [provider]     Vital Signs: BP (!) 159/73 Comment: cleviprex gtt restarted  Pulse 90   Temp 99.3 F (37.4 C) (Axillary)   Resp (!) 24   Ht 5\' 9"  (1.753 m)   Wt 182 lb 1.6 oz (82.6 kg)   SpO2 94%   BMI 26.89 kg/m   Physical Exam Constitutional:      General: He is not in acute distress.    Comments: Eyes open intermittently. Does not appear to track. Able to follow some commands. Non-verbal at this time.   Eyes:     Pupils: Pupils are equal, round, and reactive to light.  Cardiovascular:     Rate and Rhythm: Normal rate and regular rhythm.  Pulmonary:     Effort: Pulmonary effort is normal.  Skin:    General: Skin is warm and dry.  Neurological:     Mental Status: He is alert.     Comments: Unable to assess. Spontaneously moves right side of body. Able to move left leg. Left arm flaccid.      Imaging: DG Abd 1 View  Result Date: 01/25/2021 CLINICAL DATA:  OG tube placement EXAM: ABDOMEN - 1 VIEW COMPARISON:  None. FINDINGS: Esophageal tube side-port at or slightly above GE junction, suggest further advancement by 5-10 cm for more optimal positioning. Nonobstructed gas pattern. IMPRESSION: Esophageal tube side-port at or  slightly above the GE junction, suggest further advancement by 5-10 cm for more optimal positioning. These results will be called to the ordering clinician or representative by the Radiologist Assistant, and communication documented in the PACS or 01/27/2021. Electronically Signed   By: [employer] M.D.   On: 01/25/2021 20:45   CT HEAD WO CONTRAST  Result Date: 01/26/2021 CLINICAL DATA:  Stroke follow-up EXAM: CT HEAD WITHOUT CONTRAST TECHNIQUE: Contiguous axial images were obtained from the base of the skull through the vertex without intravenous contrast. COMPARISON:  Head CT 01/24/2021 FINDINGS: Brain: Large area of hypoattenuation at the site of known right MCA territory infarct. There is a focus of parenchymal hyperdensity at the posterior aspect of the infarct site. There is a small amount of subarachnoid hyperdensity over the right frontal convexity. Vascular: No hyperdense vessel or unexpected calcification. Skull: Normal. Negative for fracture or focal lesion. Sinuses/Orbits: No acute finding. Other: None. IMPRESSION: 1. Large area of hypoattenuation at the site of known right MCA territory infarct. Small focus of parenchymal hyperdensity at the posterior aspect of the infarct site [place] indicate a small amount of petechial hemorrhage or contrast extravasation from earlier procedure. 2. Small amount of subarachnoid hyperdensity over the right frontal convexity, either subarachnoid blood or contrast staining. Electronically Signed   By: 01/26/2021 M.D.   On: 01/26/2021 01:19   MR ANGIO HEAD WO CONTRAST  Result Date:  01/25/2021 CLINICAL DATA:  Left hemiplegia and left facial droop EXAM: MRI HEAD WITHOUT CONTRAST MRA HEAD WITHOUT CONTRAST TECHNIQUE: Multiplanar, multiecho pulse sequences of the brain and surrounding structures were obtained without intravenous contrast. Angiographic images of the head were obtained using MRA technique without contrast. COMPARISON:  None. FINDINGS: MRI HEAD  FINDINGS Brain: Large acute infarct of the right MCA territory involving the frontal operculum, insula and basal ganglia. There are punctate foci of acute ischemia in the right temporal lobe and right thalamus. Vascular: Major flow voids are preserved. Skull and upper cervical spine: Normal calvarium and skull base. Visualized upper cervical spine and soft tissues are normal. Sinuses/Orbits:No paranasal sinus fluid levels or advanced mucosal thickening. No mastoid or middle ear effusion. Normal orbits. MRA HEAD FINDINGS POSTERIOR CIRCULATION: --Vertebral arteries: Normal --Inferior cerebellar arteries: Normal. --Basilar artery: Normal. --Superior cerebellar arteries: Normal. --Posterior cerebral arteries: Normal. ANTERIOR CIRCULATION: --Intracranial internal carotid arteries: Normal. --Anterior cerebral arteries (ACA): Normal. --Middle cerebral arteries (MCA): Normal. ANATOMIC VARIANTS: None IMPRESSION: 1. 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 No hemorrhage or mass effect. 2. Punctate foci of acute ischemia in the right temporal lobe and right thalamus. 3. Normal intracranial MRA. Electronically Signed   By: Deatra RobinsonKevin  Herman M.D.   On: 01/25/2021 23:32   MR BRAIN WO CONTRAST  Result Date: 01/25/2021 CLINICAL DATA:  Left hemiplegia and left facial droop EXAM: MRI HEAD WITHOUT CONTRAST MRA HEAD WITHOUT CONTRAST TECHNIQUE: Multiplanar, multiecho pulse sequences of the brain and surrounding structures were obtained without intravenous contrast. Angiographic images of the head were obtained using MRA technique without contrast. COMPARISON:  None. FINDINGS: MRI HEAD FINDINGS Brain: Large acute infarct of the right MCA territory involving the frontal operculum, insula and basal ganglia. There are punctate foci of acute ischemia in the right temporal lobe and right thalamus. Vascular: Major flow voids are preserved. Skull and upper cervical spine: Normal  calvarium and skull base. Visualized upper cervical spine and soft tissues are normal. Sinuses/Orbits:No paranasal sinus fluid levels or advanced mucosal thickening. No mastoid or middle ear effusion. Normal orbits. MRA HEAD FINDINGS POSTERIOR CIRCULATION: --Vertebral arteries: Normal --Inferior cerebellar arteries: Normal. --Basilar artery: Normal. --Superior cerebellar arteries: Normal. --Posterior cerebral arteries: Normal. ANTERIOR CIRCULATION: --Intracranial internal carotid arteries: Normal. --Anterior cerebral arteries (ACA): Normal. --Middle cerebral arteries (MCA): Normal. ANATOMIC VARIANTS: None IMPRESSION: 1. 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 No hemorrhage or mass effect. 2. Punctate foci of acute ischemia in the right temporal lobe and right thalamus. 3. Normal intracranial MRA. Electronically Signed   By: Deatra RobinsonKevin  Herman M.D.   On: 01/25/2021 23:32   CT CEREBRAL PERFUSION W CONTRAST  Result Date: 01/24/2021 CLINICAL DATA:  Left-sided deficits EXAM: CT ANGIOGRAPHY HEAD AND NECK CT PERFUSION BRAIN TECHNIQUE: Multidetector CT imaging of the head and neck was performed using the standard protocol during bolus administration of intravenous contrast. Multiplanar CT image reconstructions and MIPs were obtained to evaluate the vascular anatomy. Carotid stenosis measurements (when applicable) are obtained utilizing NASCET criteria, using the distal internal carotid diameter as the denominator. Multiphase CT imaging of the brain was performed following IV bolus contrast injection. Subsequent parametric perfusion maps were calculated using RAPID software. CONTRAST:  100mL OMNIPAQUE IOHEXOL 350 MG/ML SOLN COMPARISON:  None. FINDINGS: CTA NECK FINDINGS SKELETON: There is no bony spinal canal stenosis. No lytic or blastic lesion. OTHER NECK: Normal pharynx, larynx and major salivary glands.  No cervical lymphadenopathy. Unremarkable thyroid  gland. UPPER CHEST: No pneumothorax or pleural effusion. No nodules or masses. AORTIC ARCH: There is calcific atherosclerosis of the aortic arch. There is no aneurysm, dissection or hemodynamically significant stenosis of the visualized portion of the aorta. Conventional 3 vessel aortic branching pattern. The visualized proximal subclavian arteries are widely patent. 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 The right common and external carotid arteries are normal. LEFT CAROTID SYSTEM: No dissection, occlusion or aneurysm. Mild atherosclerotic calcification at the carotid bifurcation without hemodynamically significant stenosis. VERTEBRAL ARTERIES: Left dominant configuration. Both origins are clearly patent. There is no dissection, occlusion or flow-limiting stenosis to the skull base (V1-V3 segments). CTA HEAD FINDINGS POSTERIOR CIRCULATION: --Vertebral arteries: Normal V4 segments. --Inferior cerebellar arteries: Normal. --Basilar artery: Normal. --Superior cerebellar arteries: Normal. --Posterior cerebral arteries (PCA): Normal. ANTERIOR CIRCULATION: --Intracranial internal carotid arteries: Normal. --Anterior cerebral arteries (ACA): Normal. Both A1 segments are present. Patent anterior communicating artery (a-comm). --Middle cerebral arteries (MCA): Complete occlusion of the right MCA at its origin. There is no collateral flow demonstrated. VENOUS SINUSES: As permitted by contrast timing, patent. ANATOMIC VARIANTS: None Review of the MIP images confirms the above findings. CT Brain Perfusion Findings: ASPECTS: 7 CBF (<30%) Volume: 68mL Perfusion (Tmax>6.0s) volume: 2 L38mL Mismatch Volume: Infarction Location:Right MCA territory IMPRESSION: 1. 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 2. 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 3. 68 mL right MCA territory core infarct with 133 mL  area of ischemic penumbra. Critical Value/emergent results were called by telephone at the time of interpretation on 01/24/2021 at 11:24 pm to provider ERIC [facility] , who verbally acknowledged these results. Aortic Atherosclerosis (ICD10-I70.0). Electronically Signed   By: Deatra Robinson M.D.   On: 01/24/2021 23:34   DG CHEST PORT 1 VIEW  Result Date: 01/25/2021 CLINICAL DATA:  Intubation. EXAM: PORTABLE CHEST 1 VIEW COMPARISON:  No prior. FINDINGS: Endotracheal tube and NG tube in good anatomic position. Cardiomegaly. No pulmonary venous congestion. Low lung volumes with bibasilar atelectasis. No pleural effusion or pneumothorax. IMPRESSION: 1.  Endotracheal tube and NG tube in good anatomic position. 2.  Cardiomegaly.  No pulmonary venous congestion. 3.  Low lung volumes with bibasilar atelectasis. Electronically Signed   By: Maisie Fus  Register   On: 01/25/2021 05:11   ECHOCARDIOGRAM COMPLETE  Result Date: 01/25/2021    ECHOCARDIOGRAM REPORT   Patient Name:   Leonard [name] Date of Exam: 01/25/2021 Medical Rec #:  [id number]    Height:       69.0 in Accession #:    [id number]   Weight:       182.1 lb Date of Birth:  10-12-1955   BSA:          1.985 m Patient Age:    64 years     BP:           117/59 mmHg Patient Gender: M            HR:           75 bpm. Exam Location:  Inpatient Procedure: 2D Echo, Color Doppler and Cardiac Doppler Indications:    Stroke I63.9  History:        Patient has no prior history of Echocardiogram examinations.  Sonographer:    Elmarie Shiley Dance Referring Phys: [id number] ERIC LINDZEN IMPRESSIONS  1. Left ventricular ejection fraction, by estimation,  is 60 to 65%. The left ventricle has normal function. The left ventricle has no regional wall motion abnormalities. There is mild left ventricular hypertrophy. Left ventricular diastolic parameters were normal.  2. Right ventricular systolic function is normal. The right ventricular size is normal.  3. Left atrial size was moderately dilated.  4. The  mitral valve is normal in structure. No evidence of mitral valve regurgitation. No evidence of mitral stenosis.  5. The aortic valve is tricuspid. Aortic valve regurgitation is not visualized. No aortic stenosis is present.  6. The inferior vena cava is normal in size with greater than 50% respiratory variability, suggesting right atrial pressure of 3 mmHg. FINDINGS  Left Ventricle: Left ventricular ejection fraction, by estimation, is 60 to 65%. The left ventricle has normal function. The left ventricle has no regional wall motion abnormalities. The left ventricular internal cavity size was normal in size. There is  mild left ventricular hypertrophy. Left ventricular diastolic parameters were normal. Right Ventricle: The right ventricular size is normal. No increase in right ventricular wall thickness. Right ventricular systolic function is normal. Left Atrium: Left atrial size was moderately dilated. Right Atrium: Right atrial size was normal in size. Pericardium: There is no evidence of pericardial effusion. Mitral Valve: The mitral valve is normal in structure. No evidence of mitral valve regurgitation. No evidence of mitral valve stenosis. Tricuspid Valve: The tricuspid valve is normal in structure. Tricuspid valve regurgitation is not demonstrated. No evidence of tricuspid stenosis. Aortic Valve: The aortic valve is tricuspid. Aortic valve regurgitation is not visualized. No aortic stenosis is present. Pulmonic Valve: The pulmonic valve was normal in structure. Pulmonic valve regurgitation is not visualized. No evidence of pulmonic stenosis. Aorta: The aortic root is normal in size and structure. Venous: The inferior vena cava is normal in size with greater than 50% respiratory variability, suggesting right atrial pressure of 3 mmHg. IAS/Shunts: No atrial level shunt detected by color flow Doppler.  LEFT VENTRICLE PLAX 2D LVIDd:         5.20 cm Diastology LVIDs:         3.10 cm LV e' medial:    5.22 cm/s LV  PW:         1.40 cm LV E/e' medial:  13.2 LV IVS:        1.40 cm LV e' lateral:   8.16 cm/s                        LV E/e' lateral: 8.5  RIGHT VENTRICLE             IVC RV Basal diam:  1.90 cm     IVC diam: 2.20 cm RV S prime:     13.10 cm/s TAPSE (M-mode): 1.9 cm LEFT ATRIUM             Index       RIGHT ATRIUM           Index LA diam:        4.60 cm 2.32 cm/m  RA Area:     10.90 cm LA Vol (A2C):   44.1 ml 22.21 ml/m RA Volume:   18.40 ml  9.27 ml/m LA Vol (A4C):   45.2 ml 22.77 ml/m LA Biplane Vol: 46.6 ml 23.47 ml/m  AORTIC VALVE LVOT Vmax:   129.00 cm/s LVOT Vmean:  89.700 cm/s LVOT VTI:    0.311 m  AORTA Ao Asc diam: 3.40 cm MITRAL VALVE MV Area (PHT): 3.08  cm     SHUNTS MV Decel Time: 246 msec     Systemic VTI: 0.31 m MV E velocity: 69.00 cm/s MV A velocity: 105.00 cm/s MV E/A ratio:  0.66 Charlton Haws MD Electronically signed by Charlton Haws MD Signature Date/Time: 01/25/2021/1:07:57 PM    Final    CT HEAD CODE STROKE WO CONTRAST  Result Date: 01/24/2021 CLINICAL DATA:  Code stroke.  Slurred speech and left-sided deficits EXAM: CT HEAD WITHOUT CONTRAST TECHNIQUE: Contiguous axial images were obtained from the base of the skull through the vertex without intravenous contrast. COMPARISON:  None. FINDINGS: Brain: There is no mass, hemorrhage or extra-axial collection. The size and configuration of the ventricles and extra-axial CSF spaces are normal. There is loss of gray-white differentiation within the right insula and mid MCA territory. Vascular: Hyperdense appearance of the right MCA. Skull: The visualized skull base, calvarium and extracranial soft tissues are normal. Sinuses/Orbits: No fluid levels or advanced mucosal thickening of the visualized paranasal sinuses. No mastoid or middle ear effusion. The orbits are normal. ASPECTS [facility] Stroke Program Early CT Score) - Ganglionic level infarction (caudate, lentiform nuclei, internal capsule, insula, M1-M3 cortex): 5 - Supraganglionic infarction  (M4-M6 cortex): 2 Total score (0-10 with 10 being normal): 7 IMPRESSION: 1. 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 2. Hyperdense appearance of the right MCA. 3. ASPECTS is 7. These results were called by telephone at the time of interpretation on 01/24/2021 at 10:58 pm to provider Caryl Pina, who verbally acknowledged these results. Electronically Signed   By: Deatra Robinson M.D.   On: 01/24/2021 22:58   VAS US CAROTID  Result Date: 01/25/2021 Carotid Arterial Duplex Study Indications:       Right stent and Check post-op patency of RT ICA stent. Other Factors:     No medical history on file. Limitations        Today's exam was limited due to the post surgical status of                    the patient and tissue properties. Comparison Study:  No previous exams Performing Technologist: Ernestene Mention  Examination Guidelines: A complete evaluation includes B-mode imaging, spectral Doppler, color Doppler, and power Doppler as needed of all accessible portions of each vessel. Bilateral testing is considered an integral part of a complete examination. Limited examinations for reoccurring indications [place] be performed as noted.  Right Carotid Findings: +----------+--------+--------+--------+------------------+------------------+           PSV cm/sEDV cm/sStenosisPlaque DescriptionComments           +----------+--------+--------+--------+------------------+------------------+ CCA Prox  91      14                                intimal thickening +----------+--------+--------+--------+------------------+------------------+ CCA Distal83      11              hypoechoic        intimal thickening +----------+--------+--------+--------+------------------+------------------+ ECA       108     0                                                    +----------+--------+--------+--------+------------------+------------------+  +----------+--------+-------+----------------+-------------------+  PSV cm/sEDV cmsDescribe        Arm Pressure (mmHG) +----------+--------+-------+----------------+-------------------+ [id number]            Multiphasic, WNL                    +----------+--------+-------+----------------+-------------------+ +---------+--------+--+--------+-+---------+ VertebralPSV cm/s48EDV cm/s7Antegrade +---------+--------+--+--------+-+---------+  Right Stent(s): +---------------+---+--+-------------++-----------------------------+ Prox to Stent  10918<50% stenosis                              +---------------+---+--+-------------++-----------------------------+ Proximal Stent 14220<50% stenosis                              +---------------+---+--+-------------++-----------------------------+ Mid Stent      12426<50% stenosis                              +---------------+---+--+-------------++-----------------------------+ Distal Stent   17531<50% stenosis                              +---------------+---+--+-------------++-----------------------------+ Distal to Stent13436<50% stenosisDifficult to see end of stent +---------------+---+--+-------------++-----------------------------+    Summary: Right Carotid: The extracranial vessels were near-normal with only minimal wall                thickening or plaque. The ICA stent appears patent in it's                entirety. Vertebrals:  Right vertebral artery demonstrates antegrade flow. Subclavians: Normal flow hemodynamics were seen in the right subclavian artery. *See table(s) above for measurements and observations.  Electronically signed by Coral Else MD on 01/25/2021 at 9:26:22 PM.    Final    CT ANGIO HEAD CODE STROKE  Result Date: 01/24/2021 CLINICAL DATA:  Left-sided deficits EXAM: CT ANGIOGRAPHY HEAD AND NECK CT PERFUSION BRAIN TECHNIQUE: Multidetector CT imaging of the head and neck was performed  using the standard protocol during bolus administration of intravenous contrast. Multiplanar CT image reconstructions and MIPs were obtained to evaluate the vascular anatomy. Carotid stenosis measurements (when applicable) are obtained utilizing NASCET criteria, using the distal internal carotid diameter as the denominator. Multiphase CT imaging of the brain was performed following IV bolus contrast injection. Subsequent parametric perfusion maps were calculated using RAPID software. CONTRAST:  OMNIPAQUE IOHEXOL 350 MG/ML SOLN COMPARISON:  None. FINDINGS: CTA NECK FINDINGS SKELETON: There is no bony spinal canal stenosis. No lytic or blastic lesion. OTHER NECK: Normal pharynx, larynx and major salivary glands. No cervical lymphadenopathy. Unremarkable thyroid gland. UPPER CHEST: No pneumothorax or pleural effusion. No nodules or masses. AORTIC ARCH: There is calcific atherosclerosis of the aortic arch. There is no aneurysm, dissection or hemodynamically significant stenosis of the visualized portion of the aorta. Conventional 3 vessel aortic branching pattern. The visualized proximal subclavian arteries are widely patent. 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 The right common and external carotid arteries are normal. LEFT CAROTID SYSTEM: No dissection, occlusion or aneurysm. Mild atherosclerotic calcification at the carotid bifurcation without hemodynamically significant stenosis. VERTEBRAL ARTERIES: Left dominant configuration. Both origins are clearly patent. There is no dissection, occlusion or flow-limiting stenosis to the skull base (V1-V3 segments). CTA HEAD FINDINGS POSTERIOR CIRCULATION: --Vertebral arteries: Normal V4 segments. --Inferior cerebellar arteries: Normal. --Basilar artery: Normal. --Superior cerebellar arteries: Normal. --Posterior cerebral arteries (PCA): Normal. ANTERIOR CIRCULATION: --Intracranial internal carotid arteries:  Normal. --Anterior cerebral arteries  (ACA): Normal. Both A1 segments are present. Patent anterior communicating artery (a-comm). --Middle cerebral arteries (MCA): Complete occlusion of the right MCA at its origin. There is no collateral flow demonstrated. VENOUS SINUSES: As permitted by contrast timing, patent. ANATOMIC VARIANTS: None Review of the MIP images confirms the above findings. CT Brain Perfusion Findings: ASPECTS: 7 CBF (<30%) Volume: 68mL Perfusion (Tmax>6.0s) volume: 2 L44mL Mismatch Volume: Infarction Location:Right MCA territory IMPRESSION: 1. 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 2. 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 3. 68 mL right MCA territory core infarct with 133 mL area of ischemic penumbra. Critical Value/emergent results were called by telephone at the time of interpretation on 01/24/2021 at 11:24 pm to provider ERIC [facility] , who verbally acknowledged these results. Aortic Atherosclerosis (ICD10-I70.0). Electronically Signed   By: Deatra Robinson M.D.   On: 01/24/2021 23:34   CT ANGIO NECK CODE STROKE  Result Date: 01/24/2021 CLINICAL DATA:  Left-sided deficits EXAM: CT ANGIOGRAPHY HEAD AND NECK CT PERFUSION BRAIN TECHNIQUE: Multidetector CT imaging of the head and neck was performed using the standard protocol during bolus administration of intravenous contrast. Multiplanar CT image reconstructions and MIPs were obtained to evaluate the vascular anatomy. Carotid stenosis measurements (when applicable) are obtained utilizing NASCET criteria, using the distal internal carotid diameter as the denominator. Multiphase CT imaging of the brain was performed following IV bolus contrast injection. Subsequent parametric perfusion maps were calculated using RAPID software. CONTRAST:  OMNIPAQUE IOHEXOL 350 MG/ML SOLN COMPARISON:  None. FINDINGS: CTA NECK FINDINGS SKELETON: There is no bony spinal canal  stenosis. No lytic or blastic lesion. OTHER NECK: Normal pharynx, larynx and major salivary glands. No cervical lymphadenopathy. Unremarkable thyroid gland. UPPER CHEST: No pneumothorax or pleural effusion. No nodules or masses. AORTIC ARCH: There is calcific atherosclerosis of the aortic arch. There is no aneurysm, dissection or hemodynamically significant stenosis of the visualized portion of the aorta. Conventional 3 vessel aortic branching pattern. The visualized proximal subclavian arteries are widely patent. 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 The right common and external carotid arteries are normal. LEFT CAROTID SYSTEM: No dissection, occlusion or aneurysm. Mild atherosclerotic calcification at the carotid bifurcation without hemodynamically significant stenosis. VERTEBRAL ARTERIES: Left dominant configuration. Both origins are clearly patent. There is no dissection, occlusion or flow-limiting stenosis to the skull base (V1-V3 segments). CTA HEAD FINDINGS POSTERIOR CIRCULATION: --Vertebral arteries: Normal V4 segments. --Inferior cerebellar arteries: Normal. --Basilar artery: Normal. --Superior cerebellar arteries: Normal. --Posterior cerebral arteries (PCA): Normal. ANTERIOR CIRCULATION: --Intracranial internal carotid arteries: Normal. --Anterior cerebral arteries (ACA): Normal. Both A1 segments are present. Patent anterior communicating artery (a-comm). --Middle cerebral arteries (MCA): Complete occlusion of the right MCA at its origin. There is no collateral flow demonstrated. VENOUS SINUSES: As permitted by contrast timing, patent. ANATOMIC VARIANTS: None Review of the MIP images confirms the above findings. CT Brain Perfusion Findings: ASPECTS: 7 CBF (<30%) Volume: 68mL Perfusion (Tmax>6.0s) volume: 2 L54mL Mismatch Volume: Infarction Location:Right MCA territory IMPRESSION: 1. 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 2. Complete occlusion of  the right middle cerebral artery at its origin with no collateral flow in the right MCA territory. 3. 68 mL right MCA territory core infarct with 133 mL area of ischemic penumbra. Critical Value/emergent results were called by telephone at the time of interpretation on  01/24/2021 at 11:24 pm to provider ERIC [facility] , who verbally acknowledged these results. Aortic Atherosclerosis (ICD10-I70.0). Electronically Signed   By: Deatra Robinson M.D.   On: 01/24/2021 23:34    Labs:  CBC: Recent Labs    01/24/21 2235 01/24/21 2245 01/25/21 0342 01/25/21 0404 01/26/21 0441  WBC 9.7  --  12.9*  --  18.5*  HGB 16.9 16.7 14.6 14.6 11.8*  HCT 50.2 49.0 42.3 43.0 35.3*  PLT 251  --  239  --  224    COAGS: Recent Labs    01/24/21 2235  INR 1.0  APTT 32    BMP: Recent Labs    01/24/21 2235 01/24/21 2245 01/25/21 0342 01/25/21 0404 01/26/21 0441 01/26/21 0726 01/26/21 1239  NA 131* 132* 130* 130* 135 137 140  K 4.0 5.0 4.3 4.0 3.7  --   --   CL 95* 100 100  --  103  --   --   CO2 23  --  23  --  23  --   --   GLUCOSE 103* 101* 132*  --  139*  --   --   BUN --  11  --   --   CALCIUM 9.1  --  8.1*  --  8.1*  --   --   CREATININE 0.74 0.70 0.73  --  0.98  --   --   GFRNONAA >60  --  >60  --  >60  --   --     LIVER FUNCTION TESTS: Recent Labs    01/24/21 2235  BILITOT 1.1  AST 27  ALT 31  ALKPHOS 79  PROT 7.8  ALBUMIN 4.6    Assessment and Plan:  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  Patient more awake/alert on exam today but with expressive aphasia; able to follow some commands. Patient started on 3% NS per neurology for swelling with mass effect on the right lateral ventricle seen on MR imaging last night. CT imaging this morning without significant change from MRI. Repeat CT head for this afternoon has been ordered by neurology.   Continue Aspirin and  Brilinta. Speech/PT/OT consults per neurology. IR will continue to follow.   Electronically Signed: Alwyn Ren, AGACNP-BC [phone] 01/26/2021, 3:37 PM   I spent a total of 15 Minutes at the the patient's bedside AND on the patient's hospital floor or unit, greater than 50% of which was counseling/coordinating care for right MCA territory infarct.

## 2021-01-27 ENCOUNTER — Inpatient Hospital Stay (HOSPITAL_COMMUNITY): Payer: Medicare HMO

## 2021-01-27 DIAGNOSIS — I639 Cerebral infarction, unspecified: Secondary | ICD-10-CM | POA: Diagnosis not present

## 2021-01-27 DIAGNOSIS — I161 Hypertensive emergency: Secondary | ICD-10-CM | POA: Diagnosis not present

## 2021-01-27 LAB — GLUCOSE, CAPILLARY
Glucose-Capillary: 123 mg/dL — ABNORMAL HIGH (ref 70–99)
Glucose-Capillary: 128 mg/dL — ABNORMAL HIGH (ref 70–99)
Glucose-Capillary: 131 mg/dL — ABNORMAL HIGH (ref 70–99)
Glucose-Capillary: 132 mg/dL — ABNORMAL HIGH (ref 70–99)
Glucose-Capillary: 162 mg/dL — ABNORMAL HIGH (ref 70–99)
Glucose-Capillary: 168 mg/dL — ABNORMAL HIGH (ref 70–99)

## 2021-01-27 LAB — CBC
HCT: 32.3 % — ABNORMAL LOW (ref 39.0–52.0)
Hemoglobin: 10.7 g/dL — ABNORMAL LOW (ref 13.0–17.0)
MCH: 30.2 pg (ref 26.0–34.0)
MCHC: 33.1 g/dL (ref 30.0–36.0)
MCV: 91.2 fL (ref 80.0–100.0)
Platelets: 196 10*3/uL (ref 150–400)
RBC: 3.54 MIL/uL — ABNORMAL LOW (ref 4.22–5.81)
RDW: 12.8 % (ref 11.5–15.5)
WBC: 13.7 10*3/uL — ABNORMAL HIGH (ref 4.0–10.5)
nRBC: 0 % (ref 0.0–0.2)

## 2021-01-27 LAB — BASIC METABOLIC PANEL
Anion gap: 7 (ref 5–15)
BUN: 11 mg/dL (ref 8–23)
CO2: 21 mmol/L — ABNORMAL LOW (ref 22–32)
Calcium: 7.8 mg/dL — ABNORMAL LOW (ref 8.9–10.3)
Chloride: 111 mmol/L (ref 98–111)
Creatinine, Ser: 0.74 mg/dL (ref 0.61–1.24)
GFR, Estimated: >60 mL/min (ref >60)
Glucose, Bld: 147 mg/dL — ABNORMAL HIGH (ref 70–99)
Potassium: 3.6 mmol/L (ref 3.5–5.1)
Sodium: 139 mmol/L (ref 135–145)

## 2021-01-27 LAB — PHOSPHORUS: Phosphorus: 1.6 mg/dL — ABNORMAL LOW (ref 2.5–4.6)

## 2021-01-27 LAB — SODIUM
Sodium: 141 mmol/L (ref 135–145)
Sodium: 140 mmol/L (ref 135–145)
Sodium: 143 mmol/L (ref 135–145)

## 2021-01-27 LAB — MAGNESIUM: Magnesium: 2.3 mg/dL (ref 1.7–2.4)

## 2021-01-27 MED ORDER — POTASSIUM & SODIUM PHOSPHATES 280-160-250 MG PO PACK
2.0000 | PACK | Freq: Two times a day (BID) | ORAL | Status: AC
  Administered 2021-01-27 (×2): 2
  Filled 2021-01-27 (×2): qty 2

## 2021-01-27 MED ORDER — AMLODIPINE BESYLATE 10 MG PO TABS: 10.0000 mg | ORAL_TABLET | Freq: Every day | ORAL | Status: DC

## 2021-01-27 MED ORDER — SENNOSIDES-DOCUSATE SODIUM 8.6-50 MG PO TABS
1.0000 | ORAL_TABLET | Freq: Every day | ORAL | Status: DC
  Administered 2021-01-27 – 2021-02-14 (×17): 1
  Filled 2021-01-27 (×18): qty 1

## 2021-01-27 MED ORDER — CLEVIDIPINE BUTYRATE 0.5 MG/ML IV EMUL
0.0000 mg/h | INTRAVENOUS | Status: DC
  Administered 2021-01-27 (×2): 8 mg/h via INTRAVENOUS
  Administered 2021-01-27: 1 mg/h via INTRAVENOUS
  Administered 2021-01-27: 21 mg/h via INTRAVENOUS
  Filled 2021-01-27 (×4): qty 50

## 2021-01-27 MED ORDER — AMLODIPINE BESYLATE 10 MG PO TABS
10.0000 mg | ORAL_TABLET | Freq: Every day | ORAL | Status: DC
  Administered 2021-01-27 – 2021-02-07 (×12): 10 mg
  Filled 2021-01-27 (×12): qty 1

## 2021-01-27 MED ORDER — DOXAZOSIN MESYLATE 2 MG PO TABS
2.0000 mg | ORAL_TABLET | Freq: Every day | ORAL | Status: AC
  Administered 2021-01-27 – 2021-01-29 (×3): 2 mg
  Filled 2021-01-27 (×3): qty 1

## 2021-01-27 MED ORDER — SENNOSIDES-DOCUSATE SODIUM 8.6-50 MG PO TABS: 1.0000 | ORAL_TABLET | Freq: Every evening | ORAL | Status: DC | PRN

## 2021-01-27 MED ORDER — POTASSIUM & SODIUM PHOSPHATES 280-160-250 MG PO PACK: 2.0000 | PACK | Freq: Two times a day (BID) | ORAL | Status: DC

## 2021-01-27 NOTE — Progress Notes (Signed)
Inpatient Rehab Admissions:  Inpatient Rehab Consult received.  I met with patient and wife, Santiago Glad at the bedside for rehabilitation assessment and to discuss goals and expectations of an inpatient rehab admission.  Wife acknowledged understanding of goals and expectations. She is interested in pt pursuing CIR.  Will continue to follow.  Signed: Gayland Curry, [place], [place] Admissions Coordinator [phone]

## 2021-01-27 NOTE — Progress Notes (Signed)
eLink Physician-Brief Progress Note Patient Name: Leonard [name] DOB: Aug 28, 1956 MRN: [id number]   Date of Service  01/27/2021  HPI/Events of Note  Hypertension - BP = 164/75. Goal SBP = 120-140.   eICU Interventions  Plan: 1. Restart Cleviprex IV infusion. Titrate to SBP = 120-140.     Intervention Category Major Interventions: Hypertension - evaluation and management  Salar Molden Eugene 01/27/2021, 2:29 AM

## 2021-01-27 NOTE — Progress Notes (Signed)
Patient BP not responding to the q2 hour labetalol 20 mg pushes. SBP still >160. E-link RN Gretchen called at this time regarding possibly putting patient back on cleviprex gtt. Will follow up with new orders.

## 2021-01-27 NOTE — Progress Notes (Signed)
NAME:  Leonard [name], MRN:  [id number], DOB:  25-Apr-1956, LOS: 2 ADMISSION DATE:  01/24/2021, CONSULTATION DATE:  01/25/21 REFERRING MD:  Dr. Corliss Skains, CHIEF COMPLAINT:  L hemiplegia  History of Present Illness:  65 yo male presented with Lt facial droop, hemiplegia, dysarthria.  BP 232/124 in ER.  He had R MCA/ACA/ICA thrombus retrieval with stent angioplasty.  Intubated for procedure.  Pertinent  Medical History:  None  Significant Hospital Events: Including procedures, antibiotic start and stop dates in addition to other pertinent events   . 4/21 presented with L hemiplegia, s/p intervention and remains intubated; Echo >> EF 60 to 65%, mod LA dilation; MRI brain >> large acute Rt MCA infarct, punctate foci of ischemia Rt temporal lobe and Rt thalamus; extubated. . 4/22 CT head >> large area of hypoattenuation Rt MCA territory; start 3% saline  Interim History / Subjective:  On 3% saline and cleviprex.  Objective   Blood pressure (!) 158/69, pulse 94, temperature 100 F (37.8 C), temperature source Oral, resp. rate (!) 32, height 5\' 9"  (1.753 m), weight 82.6 kg, SpO2 94 %.        Intake/Output Summary (Last 24 hours) at 01/27/2021 01/29/2021 Last data filed at 01/27/2021 0600 Gross per 24 hour  Intake 1620.23 ml  Output 1220 ml  Net 400.23 ml   Filed Weights   01/24/21 2200  Weight: 82.6 kg    Examination:  General - somnolent Eyes - pupils reactive ENT - no sinus tenderness, no stridor, snoring Cardiac - regular rate/rhythm, no murmur Chest - equal breath sounds b/l, no wheezing or rales Abdomen - soft, non tender, + bowel sounds Extremities - no cyanosis, clubbing, or edema Skin - no rashes Neuro - moans with stimulation, weak on Lt   Labs/imaging that I havepersonally reviewed  (right click and "Reselect all SmartList Selections" daily)   CMP Latest Ref Rng & Units 01/27/2021 01/26/2021 01/26/2021  Glucose 70 - 99 mg/dL 01/28/2021) - -  BUN 8 - 23 mg/dL 11 - -   Creatinine 354(S - 1.24 mg/dL 5.68 - -  Sodium 1.27 - 145 mmol/L 139 139 140  Potassium 3.5 - 5.1 mmol/L 3.6 - -  Chloride 98 - 111 mmol/L 111 - -  CO2 22 - 32 mmol/L 21(L) - -  Calcium 8.9 - 10.3 mg/dL 7.8(L) - -  Total Protein 6.5 - 8.1 g/dL - - -  Total Bilirubin 0.3 - 1.2 mg/dL - - -  Alkaline Phos 38 - 126 U/L - - -  AST 15 - 41 U/L - - -  ALT 0 - 44 U/L - - -   CBC Latest Ref Rng & Units 01/27/2021 01/26/2021 01/25/2021  WBC 4.0 - 10.5 K/uL 13.7(H) 18.5(H) -  Hemoglobin 13.0 - 17.0 g/dL 10.7(L) 11.8(L) 14.6  Hematocrit 39.0 - 52.0 % 32.3(L) 35.3(L) 43.0  Platelets 150 - 400 K/uL 196 224 -   CBG (last 3)  Recent Labs    01/26/21 1913 01/26/21 2304 01/27/21 0328  GLUCAP 142* 140* 131*    Resolved Hospital Problem list   Post operative respiratory insufficiency  Assessment & Plan:   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 Cerebral edema. - 3% saline started 4/22 >> on hold for now due to too rapid elevation in Na on 4/22 - f/u imaging per neurology - continue ASA, brilinta - will eventually need speech assess, PT/OT  Hypertensive emergency.   - wean  off cleviprex for goal SBP < 160 - add norvasc - prn labetalol  Hyperlipidemia. - resume lipitor  Dysphagia. - cortrak with tube feeds  Leukocytosis. - improved  Anemia of critical illness. - f/u CBC  Best practice (right click and "Reselect all SmartList Selections" daily)  Diet:  Tube Feed  Pain/Anxiety/Delirium protocol (if indicated): No VAP protocol (if indicated): Not indicated DVT prophylaxis: SCD and Contraindicated GI prophylaxis: N/A Glucose control: No Central venous access:  N/A Arterial line:  N/A Foley:  Yes, and it is still needed Mobility:  bed rest  PT consulted: Yes Last date of multidisciplinary goals of care discussion [pending] Code Status:  full code Disposition: ICU  Critical care time: 32 minutes  Coralyn Helling, MD [facility]  Pulmonary/Critical Care Pager - [phone] 01/27/2021, 7:11 AM

## 2021-01-27 NOTE — Progress Notes (Signed)
STROKE TEAM PROGRESS NOTE   INTERVAL HISTORY  Cortrak placed yesterday for meds and tube feeds.  Hypertensive overnight requiring Cleviprex IV infusion.  Will wean for SBP goal <160.  Repeat head CT this morning showed evolution of stroke, no worsening midline shift.  Somnolent this morning.  Able to open eyes, oriented to self and placed. Left facial droop, severe dysarthria, following simple, commands RUE and BLEs. LUE minimal spontaneous movement.    Vitals:   01/27/21 0600 01/27/21 0700 01/27/21 0800 01/27/21 0900  BP: (!) 158/69 (!) 157/67 (!) 151/70 (!) 154/70  Pulse: 94 98 94 97  Resp: (!) 32 (!) 34 (!) 30 (!) 25  Temp:      TempSrc:      SpO2: 94% 95% 95% 95%  Weight:      Height:       CBC:  Recent Labs  Lab 01/24/21 2235 01/24/21 2245 01/25/21 0342 01/25/21 0404 01/26/21 0441 01/27/21 0048  WBC 9.7  --  12.9*  --  18.5* 13.7*  NEUTROABS 7.5  --  10.3*  --   --   --   HGB 16.9   < > 14.6   < > 11.8* 10.7*  HCT 50.2   < > 42.3   < > 35.3* 32.3*  MCV 89.8  --  87.2  --  88.9 91.2  PLT 251  --  239  --  224 196   < > = values in this interval not displayed.   Basic Metabolic Panel:  Recent Labs  Lab 01/26/21 0441 01/26/21 0726 01/26/21 1825 01/27/21 0048 01/27/21 0624  NA 135   < > 139 139 141  K 3.7  --   --  3.6  --   CL 103  --   --  111  --   CO2 23  --   --  21*  --   GLUCOSE 139*  --   --  147*  --   BUN 11  --   --  11  --   CREATININE 0.98  --   --  0.74  --   CALCIUM 8.1*  --   --  7.8*  --   MG  --   --  2.2 2.3  --   PHOS  --   --  1.2* 1.6*  --    < > = values in this interval not displayed.   Lipid Panel:  Recent Labs  Lab 01/25/21 0342 01/26/21 0441  CHOL 195  --   TRIG 156* 91  HDL 38*  --   CHOLHDL 5.1  --   VLDL 31  --   LDLCALC 126*  --    HgbA1c:  Recent Labs  Lab 01/25/21 0342  HGBA1C 5.8*   Urine Drug Screen:  Recent Labs  Lab 01/25/21 0840  LABOPIA NONE DETECTED  COCAINSCRNUR NONE DETECTED  LABBENZ NONE DETECTED   AMPHETMU NONE DETECTED  THCU NONE DETECTED  LABBARB NONE DETECTED    Alcohol Level No results for input(s): ETH in the last 168 hours.  IMAGING   CT head Result Date: 01/27/2021 Impression Expected evolution of right MCA territory infarct with unchanged multifocal intraparenchymal and subarachnoid blood. No worsened midline shift.  MRA head/MRI brain Result Date: 01/26/2021 IMPRESSION: 1. 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 No hemorrhage or mass effect. 2. Punctate foci of acute ischemia in the right temporal lobe and right thalamus. 3. Normal intracranial  MRA.  DG CHEST PORT 1 VIEW Result Date: 01/25/2021 IMPRESSION:  1.  Endotracheal tube and NG tube in good anatomic position.  2.  Cardiomegaly.  No pulmonary venous congestion.  3.  Low lung volumes with bibasilar atelectasis.     CT CEREBRAL PERFUSION W CONTRAST Result Date: 01/24/2021 IMPRESSION:  1. 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  2. 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  3. 68 mL right MCA territory core infarct with 133 mL area of ischemic penumbra.   CT HEAD CODE STROKE WO CONTRAST Result Date: 01/24/2021 IMPRESSION: 1. 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 2. Hyperdense appearance of the right MCA. 3. ASPECTS is 7.    CT ANGIO HEAD/NECK CODE STROKE Result Date: 01/24/2021 IMPRESSION:  1. 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  2. 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 3. 68 mL right MCA territory core infarct with 133 mL area of ischemic penumbra.  PHYSICAL EXAM HEENT-  Leonard [name]/AT Lungs - Respirations unlabored Extremities - Warm and well perfused  Neuro - Patient lethargic but  opens eyes to voice. Unable to maintain opening.  oriented to first and last name, place only.Moderate to severe dysarthria, followingmostsimple commands. Able to name most objects.  Mild difficulty with repeating due to cooperation. Right gaze preference able to cross midline but unable to fully track to the left,blinking to visual threat bilaterally right> left. PERRL. Left facial droop. Tongueprotrusion midline. RUE spontaneous (4/5), RLE spontaneous (3/5), LUE 2-/5 LLE spontaneous 3/5, able to wiggle toes bilaterally. Sensation symmetrical bilaterally,  Right FTN intact. Left FTNslow, ataxia present, gait not tested.  ASSESSMENT/PLAN Leonard [name] is a 65 y.o. male with no significant PMH presenting with left hemiplegia, left facial droop, and dysarthria.  Was found to have an acute right MCA territory infarct s/p tPA and bilateral common carotid arteriograms followed by right MCA/ACA/ICA thrombus retrieval with stent angioplasty..   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 Per Dr. 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   Code Stroke Acute right MCA territory infarct ASPECTS 7   CTA head & neck: Complete occlusion of the R ICA and R MCA   CT perfusion 68 mL right MCA territory core infarct with 133 mL area of ischemic penumbra.  S/p IR with R MCA TICI3 and R carotid stenting.  MRI: large acute infarct of the right MCA territory involving the       frontal operculum, insula and basal ganglia with hemorrhagic conversion   MRA:  R MCA patent  Right carotid duplex: ICA stent appears patent  Repeat CT head (4/23): evolution of right MCA infarct. Overall unchanged   2D Echo: EF 60-65%, mild LVH  LDL 126  HgbA1c 5.8  UDS negative  VTE prophylaxis - SCDs  Diet: NPO  aspirin 81 mg daily prior to admission, now on aspirin 81 mg daily and Brilinta (ticagrelor) 90 mg bid.   [place] consider 30-day cardiac  event monitor as outpatient to rule out A. Fib.  Therapy recommendations:  CIR  Disposition: pending  Carotid occlusion  CTA neck: Complete occlusion of the R ICA   S/p IR with R carotid stenting.  Per Dr. 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  Right carotid duplex: ICA stent appears patent  On aspirin and Brilinta DAPT.  Cerebral Edema ? Chronic hyponatremia  MRI: early subacute ischemic infarction in the right MCA territory. There is swelling resulting in minimal mass effect on the right lateral ventricle.  Na 130->135->140->139->141  Will resume 3% if sodium drops or worsening exam, contine NS@ 25  Na goal 145-150  Repeat CT head: unchanged, no worsening midline shift  Hypertension  Home meds:  none  Cleviprex restarted overnight, wean for goal SBP <160 . SBP goal now < 160 given hemorrhagic conversion . Long-term BP goal normotensive  Hyperlipidemia  Home meds:  none  LDL 126, goal < 70  Add Lipitor 80g daily  Continue statin at discharge  Dysphagia  Secondary to stroke  NPO  SLP on board  corpak placed, on TF @ 50cc  Leukocytosis Fever  WBC 9.7-12.9-18.5->13.7  Febrile 38.3 today, likely due to aspiration  will continue to monitor for s/s of infection  Other Stroke Risk Factors  Advanced Age >/= 74   Other Active Problems  Hypokalemia: 3.6, replete  Hypophosphatemia: 1.6, replete  Hospital day # 2  Lissy Olivencia-Simmons, ACNP-BC Stroke NP    To contact Stroke Continuity provider, please refer to [email or web address]. After hours, contact General Neurology

## 2021-01-27 NOTE — PMR Pre-admission (Signed)
PMR Admission Coordinator Pre-Admission Assessment  Patient: Leonard [name] is an 65 y.o., male MRN: [id number] DOB: [birth year] Height: 5' 9"  (175.3 cm) Weight: 77.5 kg              Insurance Information HMO: yes    PPO:      PCP:      IPA:      80/20:      OTHER:  PRIMARY: [employer]      Policy#: [id number]      Subscriber: patient CM Name: Lattie Haw with follow up by Joni Reining      Phone#: [phone] EXT [phone]     Fax#: [phone] Pre-Cert#: [id number] for 7 days from 02/15/21 to 02/21/21     Employer: Retired Benefits:  Phone #: [phone]     Name: [email or web address] Eff Date: 07/07/2020- still active Deductible: does not have one OOP Max: $3,900 ($290 met) CIR: $295/day co-pay with a max co-pay of $1,770/admission (6 days) SNF: 100% coverage Outpatient:  $10-$20/visit co-pay Home Health:  100% coverage DME: 80% coverage; 20% co-insurance Providers: in-network  SECONDARY:       Policy#:       Phone#:   Development worker, community:       Phone#:   The Engineer, petroleum" for patients in Inpatient Rehabilitation Facilities with attached "Privacy Act [place] Records" was provided and verbally reviewed with: Patient and Family  Emergency Contact Information Contact Information    Name Relation Home Work Mobile   Kube,Karen Spouse   [phone]   Chaddrick, Brue   [phone]     Current Medical History  Patient Admitting Diagnosis: acute right MCA infarct  History of Present Illness: A 65 year old right-handed male unremarkable past medical history.  He lives with spouse independent prior to admission.  He has a son that lives nearby.  1 level home 4 steps to entry.  Presented 01/24/2021 with acute onset of left-sided weakness dysarthria and facial droop.  Noted blood pressure severely elevated to 32/124.  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  CT angiogram of head and neck complete occlusion of  the right internal carotid artery at its origin.  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  Patient underwent revascularization per interventional radiology.  Follow-up MRI 01/25/2021 showed a large acute infarct right MCA territory involving the frontal operculum insula and basal ganglia.  No hemorrhage or mass-effect.  Punctate foci of acute ischemia in the right temporal lobe and right thalamus.  MRA was unremarkable.  Echocardiogram with ejection fraction of 60 to 65% no wall motion abnormalities.  Currently maintained on aspirin for CVA prophylaxis as well as Brilinta.  Hospital course complicated by dysphagia patient n.p.o. alternative means of nutritional support and now has been advanced to dysphagia #2 nectar thick liquid diet..  Cleviprex added for blood pressure control.  Hospital course complicated by acute hypoxic respiratory failure due to COVID-19 pneumonia possible aspiration.  Hypoxia resolved treated with steroids/remdesivir/Actemra and broad-spectrum antimicrobial therapy.  Note he was unvaccinated.  Context infectious disease no need for isolation from 02/14/2021 onwards.  He did experience some urinary retention with a Foley tube inserted hand placed on Flomax.  Therapy evaluations completed due to patient's left-sided weakness dysphagia and dysarthria to be admitted for a comprehensive inpatient rehab program.  Complete NIHSS TOTAL: 11 Glasgow Coma Scale Score: 15  Past Medical History  History reviewed. No  pertinent past medical history.  Family History  family history is not on file.  Prior Rehab/Hospitalizations:  Has the patient had prior rehab or hospitalizations prior to admission? No  Has the patient had major surgery during 100 days prior to admission? Yes  Current Medications   Current Facility-Administered Medications:  .  acetaminophen (TYLENOL) tablet 650 mg, 650 mg, Oral, Q4H PRN **OR**  acetaminophen (TYLENOL) 160 MG/5ML solution 650 mg, 650 mg, Per Tube, Q4H PRN, 650 mg at 02/07/21 2116 **OR** acetaminophen (TYLENOL) suppository 650 mg, 650 mg, Rectal, Q4H PRN, Beulah Gandy A, NP, 650 mg at 02/04/21 0400 .  albuterol (VENTOLIN HFA) 108 (90 Base) MCG/ACT inhaler 2 puff, 2 puff, Inhalation, Q4H PRN, Cruzita Lederer, Costin M, MD .  ascorbic acid (VITAMIN C) tablet 500 mg, 500 mg, Oral, Daily, Ghimire, Henreitta Leber, MD, 500 mg at 02/15/21 0902 .  aspirin chewable tablet 81 mg, 81 mg, Oral, Daily, 81 mg at 02/15/21 0902 **OR** aspirin chewable tablet 81 mg, 81 mg, Per Tube, Daily, Beulah Gandy A, NP, 81 mg at 02/14/21 1108 .  atorvastatin (LIPITOR) tablet 80 mg, 80 mg, Oral, Daily, Ghimire, Henreitta Leber, MD, 80 mg at 02/15/21 0902 .  chlorhexidine (PERIDEX) 0.12 % solution 15 mL, 15 mL, Mouth Rinse, BID, Beulah Gandy A, NP, 15 mL at 02/15/21 0903 .  Chlorhexidine Gluconate Cloth 2 % PADS 6 each, 6 each, Topical, Daily, Beulah Gandy A, NP, 6 each at 02/15/21 0903 .  heparin injection 5,000 Units, 5,000 Units, Subcutaneous, Q8H, Elgergawy, Silver Huguenin, MD, 5,000 Units at 02/14/21 2158 .  labetalol (NORMODYNE) injection 5-20 mg, 5-20 mg, Intravenous, Q2H PRN, Beulah Gandy A, NP, 20 mg at 01/28/21 0606 .  MEDLINE mouth rinse, 15 mL, Mouth Rinse, q12n4p, Beulah Gandy A, NP, 15 mL at 02/14/21 1156 .  pantoprazole (PROTONIX) EC tablet 40 mg, 40 mg, Oral, Daily, Ghimire, Henreitta Leber, MD, 40 mg at 02/15/21 0902 .  senna-docusate (Senokot-S) tablet 1 tablet, 1 tablet, Oral, Daily, Ghimire, Henreitta Leber, MD, 1 tablet at 02/15/21 0902 .  tamsulosin (FLOMAX) capsule 0.4 mg, 0.4 mg, Oral, Daily, Ghimire, Henreitta Leber, MD .  ticagrelor (BRILINTA) tablet 90 mg, 90 mg, Oral, BID, 90 mg at 02/15/21 0902 **OR** ticagrelor (BRILINTA) tablet 90 mg, 90 mg, Per Tube, BID, Beulah Gandy A, NP, 90 mg at 02/14/21 1108 .  zinc sulfate capsule 220 mg, 220 mg, Oral, Daily, Ghimire, Henreitta Leber, MD, 220 mg at 02/15/21 1443  Patients  Current Diet:  Diet Order            Diet - low sodium heart healthy           DIET DYS 2 Room service appropriate? Yes; Fluid consistency: Nectar Thick  Diet effective now                 Precautions / Restrictions Precautions Precautions: Fall Precaution Comments: L neglect Restrictions Weight Bearing Restrictions: No LUE Weight Bearing: Partial weight bearing   Has the patient had 2 or more falls or a fall with injury in the past year?No  Prior Activity Level Community (5-7x/wk): drives, goes places everyday  Prior Functional Level Prior Function Level of Independence: Independent Comments: Pt is retired but stays busy. Pt used to work as a Pension scheme manager.  Self Care: Did the patient need help bathing, dressing, using the toilet or eating?  Independent  Indoor Mobility: Did the patient need assistance with walking from room to room (with or without device)? Independent  Stairs:  Did the patient need assistance with internal or external stairs (with or without device)? Independent  Functional Cognition: Did the patient need help planning regular tasks such as shopping or remembering to take medications? Independent  Home Assistive Devices / Equipment Home Assistive Devices/Equipment: None Home Equipment: None  Prior Device Use: Indicate devices/aids used by the patient prior to current illness, exacerbation or injury? None of the above  Current Functional Level Cognition  Arousal/Alertness: Lethargic Overall Cognitive Status: Impaired/Different from baseline Difficult to assess due to: Impaired communication Current Attention Level: Sustained Orientation Level: Oriented X4,Other (comment) (Occasionally confused to time) Following Commands: Follows one step commands consistently,Follows one step commands with increased time Safety/Judgement: Decreased awareness of safety,Decreased awareness of deficits General Comments: following simple commands with repetition.  Easily distracted and needing multimodal cueing to redirect    Extremity Assessment (includes Sensation/Coordination)  Upper Extremity Assessment: LUE deficits/detail LUE Deficits / Details: decreased strength, AROM, grasp, and attention to LUE. LUE Sensation: decreased proprioception LUE Coordination: decreased fine motor,decreased gross motor  Lower Extremity Assessment: Defer to PT evaluation LLE Deficits / Details: Weakness compared to R noted with functional mobility through decreased step length and foot clearance LLE Sensation: decreased proprioception (did not test formally, but pt appears to lack awareness of its position, needing assistance to attend to it and correct) LLE Coordination: decreased fine motor,decreased gross motor    ADLs  Overall ADL's : Needs assistance/impaired Eating/Feeding: Moderate assistance,Sitting Eating/Feeding Details (indicate cue type and reason): Mod A for incorporating LUE into task as pt scooped ice chips out of cup. Grooming: Minimal assistance,Standing,Brushing hair Grooming Details (indicate cue type and reason): Pt brushing his hair at the sink with Min A for balance. Pt then becoming quiet and noting his face was pale color. Cuing pt to sit and taking BP; 80/50s Upper Body Bathing: Moderate assistance,Sitting Lower Body Bathing: Moderate assistance,Sit to/from stand Upper Body Dressing : Maximal assistance,Sitting Upper Body Dressing Details (indicate cue type and reason): to don new gown Lower Body Dressing: Maximal assistance,Sit to/from stand Lower Body Dressing Details (indicate cue type and reason): able to don R sock with min assist but requires mod assist for L sock; up to mod assist to maintain dynamic balance Toilet Transfer: Minimal assistance,+2 for physical assistance,+2 for safety/equipment,Ambulation Toilet Transfer Details (indicate cue type and reason): simulated in room Toileting- Clothing Manipulation and Hygiene: Total  assistance,+2 for physical assistance,+2 for safety/equipment,Sit to/from stand Toileting - Clothing Manipulation Details (indicate cue type and reason): pt incontinent of bowel while sitting at sink, urinating through condom cath Functional mobility during ADLs: Minimal assistance,+2 for safety/equipment General ADL Comments: Pt performing grooming at sink; limited by othrostatics    Mobility  Overal bed mobility: Needs Assistance Bed Mobility: Supine to Sit,Sit to Supine Rolling: Mod assist,+2 for physical assistance Sidelying to sit: Mod assist,+2 for physical assistance Supine to sit: Min assist,HOB elevated Sit to supine: Min assist General bed mobility comments: assist to bring L LE off bed and elevate trunk. Multimodal cueing required for sequencing as patient easily distracted    Transfers  Overall transfer level: Needs assistance Equipment used: 1 person hand held assist Transfers: Sit to/from Stand Sit to Stand: Min assist,+2 safety/equipment Stand pivot transfers: Mod assist General transfer comment: MinA for boost up into standing and steadying    Ambulation / Gait / Stairs / Wheelchair Mobility  Ambulation/Gait Ambulation/Gait assistance: +2 safety/equipment,Min assist Gait Distance (Feet): 6 Feet Assistive device: 1 person hand held assist Gait Pattern/deviations: Narrow base  of support,Decreased stride length,Step-to pattern General Gait Details: deferred due to symptomatic orthostasis Gait velocity: decr Gait velocity interpretation: <1.31 ft/sec, indicative of household ambulator    Posture / Balance Dynamic Sitting Balance Sitting balance - Comments: distant supervision for static sitting Balance Overall balance assessment: Needs assistance Sitting-balance support: Feet supported,No upper extremity supported Sitting balance-Leahy Scale: Fair Sitting balance - Comments: distant supervision for static sitting Postural control: Posterior lean Standing balance  support: Single extremity supported,During functional activity Standing balance-Leahy Scale: Poor Standing balance comment: UE support and min guard for static standing. Standing limited by orthostatic    Special needs/care consideration Oxygen 3L nasal cannula, Skin Surgical incision: groin/anterior, proximal, right; Ecchymosis: penis/anterior, bilateral, Cortrak, Urethral catheter and Designated visitor Santiago Glad Danziger, wife     Previous Home Environment (from acute therapy documentation) Living Arrangements: Spouse/significant other  Lives With: Spouse Available Help at Discharge: Family,Available PRN/intermittently Type of Home: House Home Layout: One level Home Access: Stairs to enter Entrance Stairs-Rails: None (rail on left side in back of house) Entrance Stairs-Number of Steps: 4 steps in front; 5 steps in back Bathroom Shower/Tub: Chiropodist: Standard Bathroom Accessibility: Yes How Accessible: Accessible via walker [place]: No Additional Comments: Wife works at a Agricultural consultant. Adult son lives nearby.  Discharge Living Setting Plans for Discharge Living Setting: Patient's home Type of Home at Discharge: House Discharge Home Layout: One level Discharge Home Access: Stairs to enter Entrance Stairs-Rails: None (rail on left side of stairs in back of house) Entrance Stairs-Number of Steps: 4 steps in front; 5 steps in back Discharge Bathroom Shower/Tub: Tub/shower unit Discharge Bathroom Toilet: Standard Discharge Bathroom Accessibility: Yes How Accessible: Accessible via walker Does the patient have any problems obtaining your medications?: No  Social/Family/Support Systems Anticipated Caregiver: Santiago Glad Krach, wife; Heath Lark Salsgiver, son Anticipated Caregiver's Contact Information: Santiago Glad: [phone] Caregiver Availability: Intermittent (works during the daytime) Discharge Plan Discussed with Primary Caregiver: Yes Is Caregiver In Agreement with  Plan?: Yes Does Caregiver/Family have Issues with Lodging/Transportation while Pt is in Rehab?: No  Goals Patient/Family Goal for Rehab: Supervision-Min A: PT/OT/ST Expected length of stay: 18-21 days Pt/Family Agrees to Admission and willing to participate: Yes Program Orientation Provided & Reviewed with Pt/Caregiver Including Roles  & Responsibilities: Yes  Decrease burden of Care through IP rehab admission: NA  Possible need for SNF placement upon discharge:  Not anticipated  Patient Condition: This patient's medical and functional status has changed since the consult dated: 01/26/21 in which the Rehabilitation Physician determined and documented that the patient's condition is appropriate for intensive rehabilitative care in an inpatient rehabilitation facility. See "History of Present Illness" (above) for medical update. Functional changes are: Currently requiring min assist for transfers and min assist for ADLs. Patient's medical and functional status update has been discussed with the Rehabilitation physician and patient remains appropriate for inpatient rehabilitation. Will admit to inpatient rehab today.  Preadmission Screen Completed By:  [name], RN, 02/15/2021 12:24 PM ______________________________________________________________________   Discussed status with Dr. Dagoberto Ligas on 02/15/21 at 1200 and received approval for admission today.  Admission Coordinator:  [name], time 1224/Date 02/15/21

## 2021-01-28 DIAGNOSIS — J988 Other specified respiratory disorders: Secondary | ICD-10-CM | POA: Diagnosis not present

## 2021-01-28 DIAGNOSIS — I639 Cerebral infarction, unspecified: Secondary | ICD-10-CM | POA: Diagnosis not present

## 2021-01-28 LAB — CBC
HCT: 31.9 % — ABNORMAL LOW (ref 39.0–52.0)
Hemoglobin: 10.5 g/dL — ABNORMAL LOW (ref 13.0–17.0)
MCH: 30.1 pg (ref 26.0–34.0)
MCHC: 32.9 g/dL (ref 30.0–36.0)
MCV: 91.4 fL (ref 80.0–100.0)
Platelets: 206 10*3/uL (ref 150–400)
RBC: 3.49 MIL/uL — ABNORMAL LOW (ref 4.22–5.81)
RDW: 13.1 % (ref 11.5–15.5)
WBC: 13.2 10*3/uL — ABNORMAL HIGH (ref 4.0–10.5)
nRBC: 0 % (ref 0.0–0.2)

## 2021-01-28 LAB — BASIC METABOLIC PANEL
Anion gap: 8 (ref 5–15)
BUN: 14 mg/dL (ref 8–23)
CO2: 23 mmol/L (ref 22–32)
Calcium: 8 mg/dL — ABNORMAL LOW (ref 8.9–10.3)
Chloride: 111 mmol/L (ref 98–111)
Creatinine, Ser: 0.68 mg/dL (ref 0.61–1.24)
GFR, Estimated: >60 mL/min (ref >60)
Glucose, Bld: 151 mg/dL — ABNORMAL HIGH (ref 70–99)
Potassium: 3.6 mmol/L (ref 3.5–5.1)
Sodium: 142 mmol/L (ref 135–145)

## 2021-01-28 LAB — SODIUM
Sodium: 142 mmol/L (ref 135–145)
Sodium: 142 mmol/L (ref 135–145)

## 2021-01-28 LAB — GLUCOSE, CAPILLARY
Glucose-Capillary: 121 mg/dL — ABNORMAL HIGH (ref 70–99)
Glucose-Capillary: 144 mg/dL — ABNORMAL HIGH (ref 70–99)
Glucose-Capillary: 136 mg/dL — ABNORMAL HIGH (ref 70–99)
Glucose-Capillary: 123 mg/dL — ABNORMAL HIGH (ref 70–99)
Glucose-Capillary: 127 mg/dL — ABNORMAL HIGH (ref 70–99)

## 2021-01-28 LAB — PHOSPHORUS: Phosphorus: 3.4 mg/dL (ref 2.5–4.6)

## 2021-01-28 LAB — MAGNESIUM: Magnesium: 2.5 mg/dL — ABNORMAL HIGH (ref 1.7–2.4)

## 2021-01-28 MED ORDER — LABETALOL HCL 100 MG PO TABS
200.0000 mg | ORAL_TABLET | Freq: Two times a day (BID) | ORAL | Status: DC
  Filled 2021-01-28: qty 2

## 2021-01-28 MED ORDER — LABETALOL HCL 200 MG PO TABS
200.0000 mg | ORAL_TABLET | Freq: Two times a day (BID) | ORAL | Status: DC
  Administered 2021-01-28 – 2021-02-07 (×21): 200 mg
  Filled 2021-01-28 (×20): qty 1

## 2021-01-28 NOTE — Progress Notes (Signed)
STROKE TEAM PROGRESS NOTE   INTERVAL HISTORY  He has been able to be weaned off the IV infusion but had to be given 3 doses of IV labetalol for blood pressure control.  He is on IV Norvasc.  Exam unchanged.  Vitals:   01/28/21 0600 01/28/21 0610 01/28/21 0700 01/28/21 0800  BP: (!) 165/75 (!) 154/75 (!) 148/73 (!) 159/76  Pulse: 83 78 75 81  Resp: (!) 26 (!) 29 (!) 30 (!) 28  Temp:    99.5 F (37.5 C)  TempSrc:    Axillary  SpO2: 97% 96% 97% 95%  Weight:      Height:       CBC:  Recent Labs  Lab 01/24/21 2235 01/24/21 2245 01/25/21 0342 01/25/21 0404 01/27/21 0048 01/28/21 0336  WBC 9.7  --  12.9*   < > 13.7* 13.2*  NEUTROABS 7.5  --  10.3*  --   --   --   HGB 16.9   < > 14.6   < > 10.7* 10.5*  HCT 50.2   < > 42.3   < > 32.3* 31.9*  MCV 89.8  --  87.2   < > 91.2 91.4  PLT 251  --  239   < > 196 206   < > = values in this interval not displayed.   Basic Metabolic Panel:  Recent Labs  Lab 01/27/21 0048 01/27/21 0624 01/27/21 1805 01/28/21 0336  NA 139   < > 143 142  K 3.6  --   --  3.6  CL 111  --   --  111  CO2 21*  --   --  23  GLUCOSE 147*  --   --  151*  BUN 11  --   --  14  CREATININE 0.74  --   --  0.68  CALCIUM 7.8*  --   --  8.0*  MG 2.3  --   --  2.5*  PHOS 1.6*  --   --  3.4   < > = values in this interval not displayed.   Lipid Panel:  Recent Labs  Lab 01/25/21 0342 01/26/21 0441  CHOL 195  --   TRIG 156* 91  HDL 38*  --   CHOLHDL 5.1  --   VLDL 31  --   LDLCALC 126*  --    HgbA1c:  Recent Labs  Lab 01/25/21 0342  HGBA1C 5.8*   Urine Drug Screen:  Recent Labs  Lab 01/25/21 0840  LABOPIA NONE DETECTED  COCAINSCRNUR NONE DETECTED  LABBENZ NONE DETECTED  AMPHETMU NONE DETECTED  THCU NONE DETECTED  LABBARB NONE DETECTED    Alcohol Level No results for input(s): ETH in the last 168 hours.  IMAGING   CT head Result Date: 01/27/2021 Impression Expected evolution of right MCA territory infarct with unchanged multifocal  intraparenchymal and subarachnoid blood. No worsened midline shift.  MRA head/MRI brain Result Date: 01/26/2021 IMPRESSION: 1. 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 No hemorrhage or mass effect. 2. Punctate foci of acute ischemia in the right temporal lobe and right thalamus. 3. Normal intracranial MRA.  DG CHEST PORT 1 VIEW Result Date: 01/25/2021 IMPRESSION:  1.  Endotracheal tube and NG tube in good anatomic position.  2.  Cardiomegaly.  No pulmonary venous congestion.  3.  Low lung volumes with bibasilar atelectasis.     CT CEREBRAL PERFUSION W CONTRAST Result Date: 01/24/2021 IMPRESSION:  1. Complete  occlusion of the right internal carotid artery at its origin.  2. 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  3. 68 mL right MCA territory core infarct with 133 mL area of ischemic penumbra.   CT HEAD CODE STROKE WO CONTRAST Result Date: 01/24/2021 IMPRESSION: 1. 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 2. Hyperdense appearance of the right MCA. 3. ASPECTS is 7.    CT ANGIO HEAD/NECK CODE STROKE Result Date: 01/24/2021 IMPRESSION:  1. 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  2. 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 3. 68 mL right MCA territory core infarct with 133 mL area of ischemic penumbra.  PHYSICAL EXAM HEENT-  [place]/AT Lungs - Respirations unlabored Extremities - Warm and well perfused  Neuro - Patient lethargic but opens eyes to voice.  He is stuporous but more responsive today and follows commands easier and more consistently.  Oriented to first and last name, place only.Moderate to severe dysarthria, followingmostsimple commands. Able to name most objects.  Mild difficulty with  repeating due to cooperation. Right gaze preference able to cross midline but unable to fully track to the left,blinking to visual threat bilaterally right> left. PERRL. Left facial droop. Tongueprotrusion midline. RUE spontaneous (4+/5), RLE spontaneous (4/5), LUE 2-/5 LLE spontaneous 3/5, able to wiggle toes bilaterally. Sensation symmetrical bilaterally,  Right FTN intact. Left FTNslow, ataxia present, gait not tested.  ASSESSMENT/PLAN [name] is a 65 y.o. male with no significant PMH presenting with left hemiplegia, left facial droop, and dysarthria.  Was found to have an acute right MCA territory infarct s/p tPA and bilateral common carotid arteriograms followed by right MCA/ACA/ICA thrombus retrieval with stent angioplasty..   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 Per Dr. 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   Code Stroke Acute right MCA territory infarct ASPECTS 7   CTA head & neck: Complete occlusion of the R ICA and R MCA   CT perfusion 68 mL right MCA territory core infarct with 133 mL area of ischemic penumbra.  S/p IR with R MCA TICI3 and R carotid stenting.  MRI: large acute infarct of the right MCA territory involving the       frontal operculum, insula and basal ganglia with hemorrhagic conversion   MRA:  R MCA patent  Right carotid duplex: ICA stent appears patent  Repeat CT head (4/23): evolution of right MCA infarct. Overall unchanged   2D Echo: EF 60-65%, mild LVH  LDL 126  HgbA1c 5.8  UDS negative  VTE prophylaxis - SCDs  Diet: NPO  aspirin 81 mg daily prior to admission, now on aspirin 81 mg daily and Brilinta (ticagrelor) 90 mg bid.   [facility] consider 30-day cardiac event monitor as outpatient to rule out A. Fib.  Therapy recommendations:  CIR  Disposition: pending  Carotid occlusion  CTA neck: Complete occlusion of the R ICA   S/p IR with R carotid  stenting.  Per Dr. 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  Right carotid duplex: ICA stent appears patent  On aspirin and Brilinta DAPT.  Cerebral Edema ? Chronic hyponatremia  MRI: early subacute ischemic infarction in the right MCA territory. There is swelling resulting in minimal mass effect on the  right lateral ventricle.  Na 130->135->140->139->141  Will resume 3% if sodium drops or worsening exam, contine NS@ 25  Na goal 145-150  Repeat CT head: unchanged, no worsening midline shift  Hypertension  Home meds:  none  Cleviprex restarted overnight, wean for goal SBP <160 . SBP goal now < 160 given hemorrhagic conversion . Long-term BP goal normotensive . Continue Norvasc and add labetalol.  Hyperlipidemia  Home meds:  none  LDL 126, goal < 70  Add Lipitor 80g daily  Continue statin at discharge  Dysphagia  Secondary to stroke  NPO  SLP on board  corpak placed, on TF @ 50cc  Leukocytosis Fever  WBC 9.7-12.9-18.5->13.7  Febrile 38.3 today, likely due to aspiration  will continue to monitor for s/s of infection  Other Stroke Risk Factors  Advanced Age >/= 90   Other Active Problems  Hypokalemia: 3.6, replete  Hypophosphatemia: 1.6, replete  Hospital day # 3  This patient is critically ill due to stroke post procedure and at significant risk of neurological worsening, death form severe anemia, bleeding, recurrent stroke, intracranial stenosis. This patient's care requires constant monitoring of vital signs, hemodynamics, respiratory and cardiac monitoring, review of multiple databases, neurological assessment, other specialists and medical decision making of high complexity. I spent 35 minutes of neurocritical care time in the care of this patient    To contact Stroke Continuity provider, please refer to [email or web address]. After hours, contact General Neurology

## 2021-01-28 NOTE — Progress Notes (Signed)
RN received pt from 4 N. Pt VSS and bed is low and locked with bed alarm on and floor mats on the ground.

## 2021-01-28 NOTE — Progress Notes (Signed)
NAME:  Leonard [name], MRN:  [id number], DOB:  1956/07/20, LOS: 3 ADMISSION DATE:  01/24/2021, CONSULTATION DATE:  01/25/21 REFERRING MD:  Dr. Corliss Skains, CHIEF COMPLAINT:  L hemiplegia  History of Present Illness:  65 yo male presented with Lt facial droop, hemiplegia, dysarthria.  BP 232/124 in ER.  He had R MCA/ACA/ICA thrombus retrieval with stent angioplasty.  Intubated for procedure.  Pertinent  Medical History:  None  Significant Hospital Events: Including procedures, antibiotic start and stop dates in addition to other pertinent events   . 4/21 presented with L hemiplegia, s/p intervention and remains intubated; Echo >> EF 60 to 65%, mod LA dilation; MRI brain >> large acute Rt MCA infarct, punctate foci of ischemia Rt temporal lobe and Rt thalamus; extubated. . 4/22 CT head >> large area of hypoattenuation Rt MCA territory; start 3% saline . 4/23 CT head >> expected evolution of Rt MCA infarct . 4/24 off cleviprex  Interim History / Subjective:  Remains on supplemental oxygen.  Objective   Blood pressure (!) 148/73, pulse 75, temperature 99.8 F (37.7 C), temperature source Axillary, resp. rate (!) 30, height 5\' 9"  (1.753 m), weight 80 kg, SpO2 97 %.        Intake/Output Summary (Last 24 hours) at 01/28/2021 01/30/2021 Last data filed at 01/28/2021 0600 Gross per 24 hour  Intake 1936.65 ml  Output 1625 ml  Net 311.65 ml   Filed Weights   01/24/21 2200 01/27/21 1100  Weight: 82.6 kg 80 kg    Examination:  General - somnolent Eyes - pupils reactive ENT - no sinus tenderness, no stridor, mild snoring, gurgling some but has mild cough with stimulation Cardiac - regular rate/rhythm, no murmur Chest - equal breath sounds b/l, no wheezing or rales Abdomen - soft, non tender, + bowel sounds Extremities - no cyanosis, clubbing, or edema Skin - no rashes Neuro - speaks some with stimulation but words are difficult to comprehend, Lt facial droop and weak on Lt   Labs/imaging  that I havepersonally reviewed  (right click and "Reselect all SmartList Selections" daily)   CMP Latest Ref Rng & Units 01/28/2021 01/27/2021 01/27/2021  Glucose 70 - 99 mg/dL 01/29/2021) - -  BUN 8 - 23 mg/dL 14 - -  Creatinine 222(L - 1.24 mg/dL 7.98 - -  Sodium 9.21 - 145 mmol/L 142 143 140  Potassium 3.5 - 5.1 mmol/L 3.6 - -  Chloride 98 - 111 mmol/L 111 - -  CO2 22 - 32 mmol/L 23 - -  Calcium 8.9 - 10.3 mg/dL 8.0(L) - -  Total Protein 6.5 - 8.1 g/dL - - -  Total Bilirubin 0.3 - 1.2 mg/dL - - -  Alkaline Phos 38 - 126 U/L - - -  AST 15 - 41 U/L - - -  ALT 0 - 44 U/L - - -   CBC Latest Ref Rng & Units 01/28/2021 01/27/2021 01/26/2021  WBC 4.0 - 10.5 K/uL 13.2(H) 13.7(H) 18.5(H)  Hemoglobin 13.0 - 17.0 g/dL 10.5(L) 10.7(L) 11.8(L)  Hematocrit 39.0 - 52.0 % 31.9(L) 32.3(L) 35.3(L)  Platelets 150 - 400 K/uL 206 196 224   CBG (last 3)  Recent Labs    01/27/21 2004 01/27/21 2351 01/28/21 0353  GLUCAP 123* 162* 121[facility] Problem list   Post operative respiratory insufficiency, Leukocytosis  Assessment & Plan:   Compromised airway in setting of CVA. 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 Cerebral edema. - 3% saline started 4/22 >> on hold for now due to too rapid elevation in Na on 4/22 - f/u imaging per neurology - continue ASA, brilinta - will eventually need speech assess, PT/OT  Hypertensive emergency.   - continue norvasc - prn labetalol to keep SBP < 160  Hyperlipidemia. - continue lipitor  Dysphagia. - cortrak with tube feeds  Anemia of critical illness. - f/u CBC intermittently  Best practice (right click and "Reselect all SmartList Selections" daily)  Diet:  Tube Feed  Pain/Anxiety/Delirium protocol (if indicated): No VAP protocol (if indicated): Not indicated DVT prophylaxis: SCD and Contraindicated GI prophylaxis:  N/A Glucose control: No Central venous access:  N/A Arterial line:  N/A Foley:  Yes, and it is still needed Mobility:  bed rest  PT consulted: Yes Last date of multidisciplinary goals of care discussion [pending] Code Status:  full code Disposition: ICU  Critical care time: 33 minutes  Coralyn Helling, MD [facility] Pulmonary/Critical Care Pager - [phone] 01/28/2021, 7:23 AM

## 2021-01-29 ENCOUNTER — Inpatient Hospital Stay (HOSPITAL_COMMUNITY): Payer: Medicare HMO

## 2021-01-29 DIAGNOSIS — I6601 Occlusion and stenosis of right middle cerebral artery: Secondary | ICD-10-CM | POA: Diagnosis not present

## 2021-01-29 LAB — CBC WITH DIFFERENTIAL/PLATELET
Abs Immature Granulocytes: 0.05 10*3/uL (ref 0.00–0.07)
Basophils Absolute: 0 10*3/uL (ref 0.0–0.1)
Basophils Relative: 0 %
Eosinophils Absolute: 0.3 10*3/uL (ref 0.0–0.5)
Eosinophils Relative: 3 %
HCT: 33 % — ABNORMAL LOW (ref 39.0–52.0)
Hemoglobin: 10.9 g/dL — ABNORMAL LOW (ref 13.0–17.0)
Immature Granulocytes: 0 %
Lymphocytes Relative: 6 %
Lymphs Abs: 0.7 10*3/uL (ref 0.7–4.0)
MCH: 30.4 pg (ref 26.0–34.0)
MCHC: 33 g/dL (ref 30.0–36.0)
MCV: 92.2 fL (ref 80.0–100.0)
Monocytes Absolute: 1.2 10*3/uL — ABNORMAL HIGH (ref 0.1–1.0)
Monocytes Relative: 11 %
Neutro Abs: 8.9 10*3/uL — ABNORMAL HIGH (ref 1.7–7.7)
Neutrophils Relative %: 80 %
Platelets: 268 10*3/uL (ref 150–400)
RBC: 3.58 MIL/uL — ABNORMAL LOW (ref 4.22–5.81)
RDW: 12.9 % (ref 11.5–15.5)
WBC: 11.2 10*3/uL — ABNORMAL HIGH (ref 4.0–10.5)
nRBC: 0 % (ref 0.0–0.2)

## 2021-01-29 LAB — URINALYSIS, ROUTINE W REFLEX MICROSCOPIC
Bacteria, UA: NONE SEEN
Bilirubin Urine: NEGATIVE
Glucose, UA: NEGATIVE mg/dL
Hgb urine dipstick: NEGATIVE
Ketones, ur: NEGATIVE mg/dL
Nitrite: NEGATIVE
Protein, ur: NEGATIVE mg/dL
Specific Gravity, Urine: 1.025 (ref 1.005–1.030)
pH: 5 (ref 5.0–8.0)

## 2021-01-29 LAB — GLUCOSE, CAPILLARY
Glucose-Capillary: 117 mg/dL — ABNORMAL HIGH (ref 70–99)
Glucose-Capillary: 128 mg/dL — ABNORMAL HIGH (ref 70–99)
Glucose-Capillary: 133 mg/dL — ABNORMAL HIGH (ref 70–99)
Glucose-Capillary: 134 mg/dL — ABNORMAL HIGH (ref 70–99)
Glucose-Capillary: 135 mg/dL — ABNORMAL HIGH (ref 70–99)
Glucose-Capillary: 146 mg/dL — ABNORMAL HIGH (ref 70–99)
Glucose-Capillary: 161 mg/dL — ABNORMAL HIGH (ref 70–99)

## 2021-01-29 LAB — BASIC METABOLIC PANEL
Anion gap: 6 (ref 5–15)
BUN: 20 mg/dL (ref 8–23)
CO2: 23 mmol/L (ref 22–32)
Calcium: 8.3 mg/dL — ABNORMAL LOW (ref 8.9–10.3)
Chloride: 112 mmol/L — ABNORMAL HIGH (ref 98–111)
Creatinine, Ser: 0.79 mg/dL (ref 0.61–1.24)
GFR, Estimated: >60 mL/min (ref >60)
Glucose, Bld: 136 mg/dL — ABNORMAL HIGH (ref 70–99)
Potassium: 3.8 mmol/L (ref 3.5–5.1)
Sodium: 141 mmol/L (ref 135–145)

## 2021-01-29 LAB — SODIUM
Sodium: 143 mmol/L (ref 135–145)
Sodium: 144 mmol/L (ref 135–145)

## 2021-01-29 NOTE — Progress Notes (Signed)
Chart reviewed. Patient extubated following stroke. Off cleviprex. Ongoing BP management, encephalopathy and post-stroke measures. PCCM will see as needed. Please don't hesitate to contact us again if we can be of further assistance.   Durel Salts, MD Pulmonary and Critical Care Medicine [facility]

## 2021-01-29 NOTE — Progress Notes (Signed)
Physical Therapy Treatment Patient Details Name: Leonard [name] MRN: [id number] DOB: Sep 23, 1956 Today's Date: 01/29/2021    History of Present Illness Pt is a 65 y.o. male who presented 4/20 with acute onset L hemiplegia, L facial droop, dysarthria, and L hemineglect. tPA was administered. 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 S/p bil common carotid arteriograms and revascularization of occluded R MCA and R ACA prox and R ICA terminus. ETT 4/20-4/21. No significant PMH.    PT Comments    Pt lethargic and only able to briefly arouse with mobility. Only able to get to EOB very briefly due to pt leaning very heavily posteriorly and +2 total to keep on EOB. Continue to recommend CIR once more alert.    Follow Up Recommendations  CIR;Supervision/Assistance - 24 hour     Equipment Recommendations  Rolling walker with 5" wheels;3in1 (PT)    Recommendations for Other Services       Precautions / Restrictions Precautions Precautions: Fall Precaution Comments: L neglect; R wrist restraint    Mobility  Bed Mobility Overal bed mobility: Needs Assistance Bed Mobility: Supine to Sit;Sit to Supine     Supine to sit: +2 for physical assistance;Total assist;HOB elevated Sit to supine: +2 for physical assistance;Total assist;HOB elevated   General bed mobility comments: Assist for all aspects.    Transfers                 General transfer comment: Unable to attempt due to lethargy.  Ambulation/Gait             General Gait Details: Unable to attempt due to lethargy   Stairs             Wheelchair Mobility    Modified Rankin (Stroke Patients Only) Modified Rankin (Stroke Patients Only) Pre-Morbid Rankin Score: No symptoms Modified Rankin:  Severe disability     Balance Overall balance assessment: Needs assistance Sitting-balance support: Feet unsupported Sitting balance-Leahy Scale: Zero Sitting balance - Comments: +2 total to briefly sit EOB Postural control: Posterior lean                                  Cognition Arousal/Alertness: Lethargic Behavior During Therapy: Flat affect Overall Cognitive Status: Difficult to assess                                 General Comments: Pt lethargic and only aroused for 2-3 minutes after stimulating pt with mobility and then back to sleep.      Exercises      General Comments        Pertinent Vitals/Pain Pain Assessment: Faces Faces Pain Scale: No hurt    Home Living                      Prior Function            PT Goals (current goals can now be found in the care plan section) Progress towards PT goals: Not progressing toward goals - comment (lethargy)    Frequency    Min 4X/week      PT Plan Current plan remains appropriate    Co-evaluation  AM-PAC PT "6 Clicks" Mobility   Outcome Measure  Help needed turning from your back to your side while in a flat bed without using bedrails?: Total Help needed moving from lying on your back to sitting on the side of a flat bed without using bedrails?: Total Help needed moving to and from a bed to a chair (including a wheelchair)?: Total Help needed standing up from a chair using your arms (e.g., wheelchair or bedside chair)?: Total Help needed to walk in hospital room?: Total Help needed climbing 3-5 steps with a railing? : Total 6 Click Score: 6    End of Session   Activity Tolerance: Patient limited by lethargy Patient left: in bed;with call bell/phone within reach;with bed alarm set;with restraints reapplied;with SCD's reapplied Nurse Communication: Mobility status PT Visit Diagnosis: Unsteadiness on feet (R26.81);Other abnormalities of gait and  mobility (R26.89);Muscle weakness (generalized) (M62.81);Difficulty in walking, not elsewhere classified (R26.2);Other symptoms and signs involving the nervous system (R29.898);Hemiplegia and hemiparesis Hemiplegia - Right/Left: Left Hemiplegia - caused by: Cerebral infarction     Time: 1130-1150 PT Time Calculation (min) (ACUTE ONLY): 20 min  Charges:  $Therapeutic Activity: 8-22 mins                     [facility] PT Acute Rehabilitation Services Pager [phone] Office [phone]    Angelina Ok [facility] 01/29/2021, 2:37 PM

## 2021-01-29 NOTE — Progress Notes (Signed)
Inpatient Rehab Admissions Coordinator:  Pt not medically stable for potential admission to CIR and there are no beds available.  Will continue to follow.   Wolfgang Phoenix, MS, CCC-SLP Admissions Coordinator [phone]

## 2021-01-29 NOTE — Plan of Care (Signed)
  Problem: Safety: Goal: Non-violent Restraint(s) Outcome: Progressing   Problem: Education: Goal: Knowledge of disease or condition will improve Outcome: Progressing Goal: Knowledge of secondary prevention will improve Outcome: Progressing Goal: Knowledge of patient specific risk factors addressed and post discharge goals established will improve Outcome: Progressing Goal: Individualized Educational Video(s) Outcome: Progressing   Problem: Education: Goal: Knowledge of General Education information will improve Description: Including pain rating scale, medication(s)/side effects and non-pharmacologic comfort measures Outcome: Progressing   Problem: Health Behavior/Discharge Planning: Goal: Ability to manage health-related needs will improve Outcome: Progressing   Problem: Clinical Measurements: Goal: Ability to maintain clinical measurements within normal limits will improve Outcome: Progressing Goal: Will remain free from infection Outcome: Progressing Goal: Diagnostic test results will improve Outcome: Progressing Goal: Respiratory complications will improve Outcome: Progressing Goal: Cardiovascular complication will be avoided Outcome: Progressing   Problem: Activity: Goal: Risk for activity intolerance will decrease Outcome: Progressing   Problem: Nutrition: Goal: Adequate nutrition will be maintained Outcome: Progressing   Problem: Coping: Goal: Level of anxiety will decrease Outcome: Progressing   Problem: Elimination: Goal: Will not experience complications related to bowel motility Outcome: Progressing Goal: Will not experience complications related to urinary retention Outcome: Progressing   Problem: Pain Managment: Goal: General experience of comfort will improve Outcome: Progressing   Problem: Safety: Goal: Ability to remain free from injury will improve Outcome: Progressing   Problem: Skin Integrity: Goal: Risk for impaired skin integrity  will decrease Outcome: Progressing   

## 2021-01-29 NOTE — Progress Notes (Signed)
  Speech Language Pathology Treatment: Dysphagia  Patient Details Name: Leonard [name] MRN: [id number] DOB: Morell 17, 1957 Today's Date: 01/29/2021 Time: 0867-6195 SLP Time Calculation (min) (ACUTE ONLY): 15 min  Assessment / Plan / Recommendation Clinical Impression  Pt seen for dysphagia tx with max verbal/tactile cues provided during extensive oral care with removal of dried lingual residue.  Hoarse vocal quality noted with min verbal responses as pt was oriented to self/place, but was largely lethargic throughout tx session which impacted length of session.  Pt consumed ice chips with max verbal/tactile cues to increase speed of swallow/manipulate within oral cavity.  Oral holding noted with trials of ice chips, but swallow was initiated x1 with max cues, but delayed throat clearing noted post-swallow.  Pt has a Cortrak in place for nutrition/hydration and medication given via alternative means.  Recommend continue NPO and ST will continue to f/u for PO readiness and cognitive re-assessment as LOA increases.       HPI HPI: Pt is a 65 y.o. male with no significant PMHx who presented via EMS as a code stroke for acute onset of left hemiplegia, left facial droop, dysarthria and left hemineglect. NIHSS was 18 on admission and tPA was given. 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 Pt s/p  s/p bilateral common carotid arteriograms followed by right MCA/ACA/ICA thrombus retrieval with stent angioplasty 4/21. Pt was intubated for procedure. CT head 4/22: Large area of hypoattenuation at the site of known right MCA territory infarct. Small focus of parenchymal hyperdensity at the posterior aspect of the infarct site Hanf indicate a small amount of petechial hemorrhage or contrast extravasation from procedure. Small amount of subarachnoid hyperdensity over the right frontal convexity, either subarachnoid blood or contrast taining.       SLP Plan  Continue with current plan of care       Recommendations  Diet recommendations: NPO Medication Administration: Via alternative means                Oral Care Recommendations: Oral care QID Follow up Recommendations: Other (comment) (TBD) SLP Visit Diagnosis: Dysphagia, oropharyngeal phase (R13.12) Plan: Continue with current plan of care                       Tressie Stalker, M.S., CCC-SLP 01/29/2021, 11:40 AM

## 2021-01-29 NOTE — Progress Notes (Signed)
STROKE TEAM PROGRESS NOTE   INTERVAL HISTORY Remains lethargic, inattentive. Following some simple commands.  CXR, UA, CBC pending. Electrolytes stable.  Afebrile.  Vital signs stable.  Neuro exam unchanged  Vitals:   01/28/21 1902 01/29/21 0046 01/29/21 0433 01/29/21 0734  BP: (!) 157/71 (!) 148/72  (!) 162/76  Pulse: 80  84 80  Resp: (!) 25 (!) 25 (!) 24 (!) 31  Temp: 99.9 F (37.7 C) 98.9 F (37.2 C) 98.6 F (37 C) 99.9 F (37.7 C)  TempSrc: Oral Oral Oral Axillary  SpO2: 95%  96% 95%  Weight:      Height:       CBC:  Recent Labs  Lab 01/24/21 2235 01/24/21 2245 01/25/21 0342 01/25/21 0404 01/27/21 0048 01/28/21 0336  WBC 9.7  --  12.9*   < > 13.7* 13.2*  NEUTROABS 7.5  --  10.3*  --   --   --   HGB 16.9   < > 14.6   < > 10.7* 10.5*  HCT 50.2   < > 42.3   < > 32.3* 31.9*  MCV 89.8  --  87.2   < > 91.2 91.4  PLT 251  --  239   < > 196 206   < > = values in this interval not displayed.   Basic Metabolic Panel:  Recent Labs  Lab 01/27/21 0048 01/27/21 0624 01/28/21 0336 01/28/21 1316 01/28/21 1837 01/29/21 0106  NA 139   < > 142   < > 142 141  143  K 3.6  --  3.6  --   --  3.8  CL 111  --  111  --   --  112*  CO2 21*  --  23  --   --  23  GLUCOSE 147*  --  151*  --   --  136*  BUN 11  --  14  --   --  20  CREATININE 0.74  --  0.68  --   --  0.79  CALCIUM 7.8*  --  8.0*  --   --  8.3*  MG 2.3  --  2.5*  --   --   --   PHOS 1.6*  --  3.4  --   --   --    < > = values in this interval not displayed.   Lipid Panel:  Recent Labs  Lab 01/25/21 0342 01/26/21 0441  CHOL 195  --   TRIG 156* 91  HDL 38*  --   CHOLHDL 5.1  --   VLDL 31  --   LDLCALC 126*  --    HgbA1c:  Recent Labs  Lab 01/25/21 0342  HGBA1C 5.8*   Urine Drug Screen:  Recent Labs  Lab 01/25/21 0840  LABOPIA NONE DETECTED  COCAINSCRNUR NONE DETECTED  LABBENZ NONE DETECTED  AMPHETMU NONE DETECTED  THCU NONE DETECTED  LABBARB NONE DETECTED    Alcohol Level No results for  input(s): ETH in the last 168 hours.  IMAGING   CT head Result Date: 01/27/2021 Impression Expected evolution of right MCA territory infarct with unchanged multifocal intraparenchymal and subarachnoid blood. No worsened midline shift.  MRA head/MRI brain Result Date: 01/26/2021 IMPRESSION: 1. 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 No hemorrhage or mass effect. 2. Punctate foci of acute ischemia in the right temporal lobe and right thalamus. 3. Normal intracranial MRA.  DG CHEST PORT 1 VIEW Result Date:  01/25/2021 IMPRESSION:  1.  Endotracheal tube and NG tube in good anatomic position.  2.  Cardiomegaly.  No pulmonary venous congestion.  3.  Low lung volumes with bibasilar atelectasis.    CT CEREBRAL PERFUSION W CONTRAST Result Date: 01/24/2021 IMPRESSION:  1. 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  2. 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  3. 68 mL right MCA territory core infarct with 133 mL area of ischemic penumbra.   CT HEAD CODE STROKE WO CONTRAST Result Date: 01/24/2021 IMPRESSION: 1. 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 2. Hyperdense appearance of the right MCA. 3. ASPECTS is 7.   CT ANGIO HEAD/NECK CODE STROKE Result Date: 01/24/2021 IMPRESSION:  1. 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  2. 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 3. 68 mL right MCA territory core infarct with 133 mL area of ischemic penumbra.  PHYSICAL EXAM Frail middle-age male not in distress HEENT-  Leonard [name] Lungs - Respirations unlabored Extremities - Warm and well perfused  Neuro - Patient lethargic but opens eyes to voice.  He is stuporous but more responsive today and  follows commands easier and more consistently.  Oriented to first and last name, place only.Moderate to severe dysarthria, followingmostsimple commands. Able to name most objects.  Mild difficulty with repeating due to cooperation. Right gaze preference able to cross midline but unable to fully track to the left,blinking to visual threat bilaterally right> left. PERRL. Left facial droop. Tongueprotrusion midline. RUE spontaneous (4+/5), RLE spontaneous (4/5), LUE 2-/5 LLE spontaneous 3/5, able to wiggle toes bilaterally. Sensation symmetrical bilaterally,  Right FTN intact. Left FTNslow, ataxia present, gait not tested.  ASSESSMENT/PLAN [name] is a 65 y.o. male with no significant PMH presenting with left hemiplegia, left facial droop, and dysarthria.  Was found to have an acute right MCA territory infarct s/p tPA and bilateral common carotid arteriograms followed by right MCA/ACA/ICA thrombus retrieval with stent angioplasty..   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 Per Dr. 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   Code Stroke Acute right MCA territory infarct ASPECTS 7   CTA head & neck: Complete occlusion of the R ICA and R MCA   CT perfusion 68 mL right MCA territory core infarct with 133 mL area of ischemic penumbra.  S/p IR with R MCA TICI3 and R carotid stenting.  MRI: large acute infarct of the right MCA territory involving the       frontal operculum, insula and basal ganglia with hemorrhagic conversion   MRA:  R MCA patent  Right carotid duplex: ICA stent appears patent  Repeat CT head (4/23): evolution of right MCA infarct. Overall unchanged   2D Echo: EF 60-65%, mild LVH  LDL 126  HgbA1c 5.8  UDS negative  VTE prophylaxis - SCDs  Diet: NPO, Tube feeding  aspirin 81 mg daily prior to admission, now on aspirin 81 mg daily and Brilinta (ticagrelor) 90 mg bid.   Bauch consider  30-day cardiac event monitor as outpatient to rule out A. Fib.  Therapy recommendations:  CIR  Disposition: pending  Carotid occlusion  CTA neck: Complete occlusion of the R ICA   S/p IR  with R carotid stenting.  Per Dr. 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  Right carotid duplex: ICA stent appears patent  On aspirin and Brilinta DAPT.  Cerebral Edema ? Chronic hyponatremia  MRI: early subacute ischemic infarction in the right MCA territory. There is swelling resulting in minimal mass effect on the right lateral ventricle.  Na 130->135->140->139->141->  Na goal 145-150  Repeat CT head 4/23: unchanged, no worsening midline shift  Hypertension  Home meds:  none  Cleviprex restarted overnight, wean for goal SBP <160 . SBP goal now < 160 given hemorrhagic conversion . Long-term BP goal normotensive . Continue Norvasc and add labetalol.  Hyperlipidemia  Home meds:  none  LDL 126, goal < 70  Add Lipitor 80g daily  Continue statin at discharge  Dysphagia  Secondary to stroke  NPO  SLP on board  corpak placed, on TF @ 50cc  Leukocytosis Fever  WBC 9.7-12.9-18.5->13.7  Tmax 99.9, ?due to aspiration  will continue to monitor for s/s of infection  CXR pending  UA pending   Other Stroke Risk Factors  Advanced Age >/= 24   Other Active Problems  Hypokalemia: wnl today   Hospital day # 4  Continue ongoing medical management.  Mobilize out of bed.  Therapy consults.  Hopefully transfer to rehab in the next few days after insurance approval and bed available.  No family at the bedside.  Greater than 50% time during this 25-minute visit was spent in counseling and coordination of care about stroke and questionable rehab and answering questions  Delia Heady MD  To contact Stroke Continuity provider, please refer to [email or web address]. After hours, contact General Neurology

## 2021-01-29 NOTE — Progress Notes (Signed)
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  Subjective: Patient has been moved out of the ICU. Is he sleepy but arousable and able to answer a few questions; follows some simple commands. Left arm remains flaccid.   Allergies: Patient has no known allergies.  Medications: Prior to Admission medications   Medication Sig Start Date End Date Taking? Authorizing Provider  acetaminophen (TYLENOL) 500 MG tablet Take 1,000 mg by mouth every 6 (six) hours as needed for mild pain.   Yes [provider]  aspirin EC 81 MG tablet Take 81 mg by mouth daily. Swallow whole.   Yes [provider]     Vital Signs: BP (!) 111/59 (BP Location: Left Arm)   Pulse 66   Temp 100 F (37.8 C) (Axillary) Comment: RN Notified  Resp (!) 26 Comment: RN Notified  Ht 5\' 9"  (1.753 m)   Wt 176 lb 5.9 oz (80 kg)   SpO2 95%   BMI 26.05 kg/m   Physical Exam Constitutional:      General: He is not in acute distress.    Comments: Sleepy but opened eyes to command. Able to follow a few simple commands; able to answer a few simple questions.   HENT:     Nose:     Comments: Cor trak Genitourinary:    Comments: foley Skin:    General: Skin is warm and dry.  Neurological:     Comments: Unable to fully assess orientation level. Patient able to move right arm and both legs. Left arm flaccid. Speech is somewhat garbled.      Imaging: DG Abd 1 View  Result Date: 01/25/2021 CLINICAL DATA:  OG tube placement EXAM: ABDOMEN - 1 VIEW COMPARISON:  None. FINDINGS: Esophageal tube side-port at or slightly above GE junction, suggest further advancement by 5-10 cm for more optimal positioning. Nonobstructed gas pattern. IMPRESSION: Esophageal tube side-port at or  slightly above the GE junction, suggest further advancement by 5-10 cm for more optimal positioning. These results will be called to the ordering clinician or representative by the Radiologist Assistant, and communication documented in the PACS or [employer] Dashboard. Electronically Signed   By: Jasmine PangKim  Fujinaga M.D.   On: 01/25/2021 20:45   CT HEAD WO CONTRAST  Result Date: 01/27/2021 CLINICAL DATA:  Stroke follow-up EXAM: CT HEAD WITHOUT CONTRAST TECHNIQUE: Contiguous axial images were obtained from the base of the skull through the vertex without intravenous contrast. COMPARISON:  01/26/2021 FINDINGS: Brain: Continued evolution of right MCA territory infarct. No worsened midline shift. Unchanged multifocal intraparenchymal and subarachnoid blood. Vascular: No hyperdense vessel or unexpected calcification. Skull: Normal. Negative for fracture or focal lesion. Sinuses/Orbits: No acute finding. Other: None. IMPRESSION: Expected evolution of right MCA territory infarct with unchanged multifocal intraparenchymal and subarachnoid blood. No worsened midline shift. Electronically Signed   By: Deatra RobinsonKevin  Herman M.D.   On: 01/27/2021 03:17   CT HEAD WO CONTRAST  Result Date: 01/26/2021 CLINICAL DATA:  Stroke follow-up EXAM: CT HEAD WITHOUT CONTRAST TECHNIQUE: Contiguous axial images were obtained from the base of the skull through the vertex without intravenous contrast. COMPARISON:  Head CT 01/24/2021 FINDINGS: Brain: Large area of hypoattenuation at the site of known right MCA territory infarct. There is a focus of parenchymal hyperdensity at the posterior aspect of the infarct site.  There is a small amount of subarachnoid hyperdensity over the right frontal convexity. Vascular: No hyperdense vessel or unexpected calcification. Skull: Normal. Negative for fracture or focal lesion. Sinuses/Orbits: No acute finding. Other: None. IMPRESSION: 1. Large area of hypoattenuation at the site of known right MCA territory infarct.  Small focus of parenchymal hyperdensity at the posterior aspect of the infarct site Rostad indicate a small amount of petechial hemorrhage or contrast extravasation from earlier procedure. 2. Small amount of subarachnoid hyperdensity over the right frontal convexity, either subarachnoid blood or contrast staining. Electronically Signed   By: Deatra Robinson M.D.   On: 01/26/2021 01:19   MR ANGIO HEAD WO CONTRAST  Result Date: 01/25/2021 CLINICAL DATA:  Left hemiplegia and left facial droop EXAM: MRI HEAD WITHOUT CONTRAST MRA HEAD WITHOUT CONTRAST TECHNIQUE: Multiplanar, multiecho pulse sequences of the brain and surrounding structures were obtained without intravenous contrast. Angiographic images of the head were obtained using MRA technique without contrast. COMPARISON:  None. FINDINGS: MRI HEAD FINDINGS Brain: Large acute infarct of the right MCA territory involving the frontal operculum, insula and basal ganglia. There are punctate foci of acute ischemia in the right temporal lobe and right thalamus. Vascular: Major flow voids are preserved. Skull and upper cervical spine: Normal calvarium and skull base. Visualized upper cervical spine and soft tissues are normal. Sinuses/Orbits:No paranasal sinus fluid levels or advanced mucosal thickening. No mastoid or middle ear effusion. Normal orbits. MRA HEAD FINDINGS POSTERIOR CIRCULATION: --Vertebral arteries: Normal --Inferior cerebellar arteries: Normal. --Basilar artery: Normal. --Superior cerebellar arteries: Normal. --Posterior cerebral arteries: Normal. ANTERIOR CIRCULATION: --Intracranial internal carotid arteries: Normal. --Anterior cerebral arteries (ACA): Normal. --Middle cerebral arteries (MCA): Normal. ANATOMIC VARIANTS: None IMPRESSION: 1. 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 No hemorrhage or mass effect. 2. Punctate foci of acute ischemia in the right temporal lobe and right thalamus. 3.  Normal intracranial MRA. Electronically Signed   By: Deatra Robinson M.D.   On: 01/25/2021 23:32   MR BRAIN WO CONTRAST  Result Date: 01/25/2021 CLINICAL DATA:  Left hemiplegia and left facial droop EXAM: MRI HEAD WITHOUT CONTRAST MRA HEAD WITHOUT CONTRAST TECHNIQUE: Multiplanar, multiecho pulse sequences of the brain and surrounding structures were obtained without intravenous contrast. Angiographic images of the head were obtained using MRA technique without contrast. COMPARISON:  None. FINDINGS: MRI HEAD FINDINGS Brain: Large acute infarct of the right MCA territory involving the frontal operculum, insula and basal ganglia. There are punctate foci of acute ischemia in the right temporal lobe and right thalamus. Vascular: Major flow voids are preserved. Skull and upper cervical spine: Normal calvarium and skull base. Visualized upper cervical spine and soft tissues are normal. Sinuses/Orbits:No paranasal sinus fluid levels or advanced mucosal thickening. No mastoid or middle ear effusion. Normal orbits. MRA HEAD FINDINGS POSTERIOR CIRCULATION: --Vertebral arteries: Normal --Inferior cerebellar arteries: Normal. --Basilar artery: Normal. --Superior cerebellar arteries: Normal. --Posterior cerebral arteries: Normal. ANTERIOR CIRCULATION: --Intracranial internal carotid arteries: Normal. --Anterior cerebral arteries (ACA): Normal. --Middle cerebral arteries (MCA): Normal. ANATOMIC VARIANTS: None IMPRESSION: 1. 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 No hemorrhage or mass effect. 2. Punctate foci of acute ischemia in the right temporal lobe and right thalamus. 3. Normal intracranial MRA. Electronically Signed   By: Deatra Robinson M.D.   On: 01/25/2021 23:32   DG Chest Port 1 View  Result Date: 01/29/2021 CLINICAL DATA:  Encounter for compromised airway. EXAM: PORTABLE  CHEST 1 VIEW COMPARISON:  01/27/2021. FINDINGS: Feeding tube is followed into the  stomach with the tip projecting beyond the inferior margin of the image. Trachea is midline. Heart is enlarged, stable. Lungs are low in volume with increasing mixed interstitial and airspace opacification, worst in the right upper lobe. No definite pleural fluid. IMPRESSION: Low lung volumes with increasing mixed interstitial and airspace opacification, findings which Maxcy be due to edema, pneumonia and/or aspiration. Electronically Signed   By: Leanna Battles M.D.   On: 01/29/2021 08:37   DG Chest Port 1 View  Result Date: 01/27/2021 CLINICAL DATA:  Shortness of breath EXAM: PORTABLE CHEST 1 VIEW COMPARISON:  January 25, 2021 FINDINGS: The ET tube is been removed. A feeding tube is been placed, terminating below today's film. The NG tube is been removed. No pneumothorax. The cardiomediastinal silhouette is stable. The left lung is clear. No acute abnormalities seen on the right. IMPRESSION: 1. Support apparatus as above. 2. No acute abnormalities. Electronically Signed   By: Gerome Sam III M.D   On: 01/27/2021 14:21    Labs:  CBC: Recent Labs    01/26/21 0441 01/27/21 0048 01/28/21 0336 01/29/21 1503  WBC 18.5* 13.7* 13.2* 11.2*  HGB 11.8* 10.7* 10.5* 10.9*  HCT 35.3* 32.3* 31.9* 33.0*  PLT 224 196 206 268    COAGS: Recent Labs    01/24/21 2235  INR 1.0  APTT 32    BMP: Recent Labs    01/26/21 0441 01/26/21 0726 01/27/21 0048 01/27/21 0624 01/28/21 0336 01/28/21 1316 01/28/21 1837 01/29/21 0106 01/29/21 1222  NA 135   < > 139   < > 142 142 142 141  143 144  K 3.7  --  3.6  --  3.6  --   --  3.8  --   CL 103  --  111  --  111  --   --  112*  --   CO2 23  --  21*  --  23  --   --  23  --   GLUCOSE 139*  --  147*  --  151*  --   --  136*  --   BUN 11  --  11  --  14  --   --  20  --   CALCIUM 8.1*  --  7.8*  --  8.0*  --   --  8.3*  --   CREATININE 0.98  --  0.74  --  0.68  --   --  0.79  --   GFRNONAA >60  --  >60  --  >60  --   --  >60  --    < > = values in  this interval not displayed.    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  Patient has improved neurologically compared to last visit. He was able to follow most commands and was able to answer a few simple questions. He remains flaccid in the left arm/hand. He has been assessed by inpatient rehab and is not medically stable at this time.    Continue Aspirin and Brilinta. Other plans per neurology/primary teams.   IR will continue to follow.   Electronically Signed: Alwyn Ren, AGACNP-BC  [phone] 01/29/2021, 4:01 PM   I spent a total of 15 Minutes at the the patient's bedside AND on the patient's hospital floor or unit, greater than 50% of which was counseling/coordinating care for post-stroke follow up.

## 2021-01-30 ENCOUNTER — Inpatient Hospital Stay (HOSPITAL_COMMUNITY): Payer: Medicare HMO

## 2021-01-30 DIAGNOSIS — I6300 Cerebral infarction due to thrombosis of unspecified precerebral artery: Secondary | ICD-10-CM

## 2021-01-30 LAB — CBC WITH DIFFERENTIAL/PLATELET
Abs Immature Granulocytes: 0.05 10*3/uL (ref 0.00–0.07)
Basophils Absolute: 0 10*3/uL (ref 0.0–0.1)
Basophils Relative: 0 %
Eosinophils Absolute: 0.4 10*3/uL (ref 0.0–0.5)
Eosinophils Relative: 3 %
HCT: 35.7 % — ABNORMAL LOW (ref 39.0–52.0)
Hemoglobin: 11.6 g/dL — ABNORMAL LOW (ref 13.0–17.0)
Immature Granulocytes: 0 %
Lymphocytes Relative: 6 %
Lymphs Abs: 0.7 10*3/uL (ref 0.7–4.0)
MCH: 30.3 pg (ref 26.0–34.0)
MCHC: 32.5 g/dL (ref 30.0–36.0)
MCV: 93.2 fL (ref 80.0–100.0)
Monocytes Absolute: 1.6 10*3/uL — ABNORMAL HIGH (ref 0.1–1.0)
Monocytes Relative: 13 %
Neutro Abs: 9.5 10*3/uL — ABNORMAL HIGH (ref 1.7–7.7)
Neutrophils Relative %: 78 %
Platelets: 295 10*3/uL (ref 150–400)
RBC: 3.83 MIL/uL — ABNORMAL LOW (ref 4.22–5.81)
RDW: 13 % (ref 11.5–15.5)
WBC: 12.2 10*3/uL — ABNORMAL HIGH (ref 4.0–10.5)
nRBC: 0 % (ref 0.0–0.2)

## 2021-01-30 LAB — COMPREHENSIVE METABOLIC PANEL
ALT: 47 U/L — ABNORMAL HIGH (ref 0–44)
AST: 43 U/L — ABNORMAL HIGH (ref 15–41)
Albumin: 3.2 g/dL — ABNORMAL LOW (ref 3.5–5.0)
Alkaline Phosphatase: 78 U/L (ref 38–126)
Anion gap: 9 (ref 5–15)
BUN: 21 mg/dL (ref 8–23)
CO2: 24 mmol/L (ref 22–32)
Calcium: 8.4 mg/dL — ABNORMAL LOW (ref 8.9–10.3)
Chloride: 110 mmol/L (ref 98–111)
Creatinine, Ser: 0.8 mg/dL (ref 0.61–1.24)
GFR, Estimated: >60 mL/min (ref >60)
Glucose, Bld: 127 mg/dL — ABNORMAL HIGH (ref 70–99)
Potassium: 4 mmol/L (ref 3.5–5.1)
Sodium: 143 mmol/L (ref 135–145)
Total Bilirubin: 0.8 mg/dL (ref 0.3–1.2)
Total Protein: 6.4 g/dL — ABNORMAL LOW (ref 6.5–8.1)

## 2021-01-30 LAB — GLUCOSE, CAPILLARY
Glucose-Capillary: 132 mg/dL — ABNORMAL HIGH (ref 70–99)
Glucose-Capillary: 119 mg/dL — ABNORMAL HIGH (ref 70–99)
Glucose-Capillary: 106 mg/dL — ABNORMAL HIGH (ref 70–99)
Glucose-Capillary: 119 mg/dL — ABNORMAL HIGH (ref 70–99)
Glucose-Capillary: 126 mg/dL — ABNORMAL HIGH (ref 70–99)
Glucose-Capillary: 121 mg/dL — ABNORMAL HIGH (ref 70–99)

## 2021-01-30 NOTE — Progress Notes (Signed)
Inpatient Rehab Admissions Coordinator:   Pt. Currently with limited participation with therapies due to lethargy (per 01/29/21 PT note). I will follow for tolerance and participation and pursue for  potential admit to CIR if Pt. Progresses with therapies and appears able to tolerate 3 hours of daily therapy.   Megan Salon, MS, CCC-SLP Rehab Admissions Coordinator  [phone] (celll) [phone] (office)

## 2021-01-30 NOTE — Care Management Important Message (Signed)
Important Message  Patient Details  Name: Leonard [name] MRN: [id number] Date of Birth: April 15, 1956   Medicare Important Message Given:  Yes     Dorena Bodo 01/30/2021, 3:15 PM

## 2021-01-30 NOTE — Progress Notes (Signed)
Occupational Therapy Treatment Patient Details Name: Leonard [name] MRN: [id number] DOB: 03-29-1956 Today's Date: 01/30/2021    History of present illness Pt is a 65 y.o. male who presented 4/20 with acute onset L hemiplegia, L facial droop, dysarthria, and L hemineglect. tPA was administered. 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 S/p bil common carotid arteriograms and revascularization of occluded R MCA and R ACA prox and R ICA terminus. ETT 4/20-4/21. No significant PMH.   OT comments  Patient supine in bed, seen for OT/PT session.  Lethargic upon entry, but improved with OOB activity.  Patient requires max assist +2 for bed mobility, but once sitting able to sit with min guard to min assist statically.  He requires max assist for LB dressing, min assist for washing face with R UE, and initially mod assist +2 for transfers fading to min assist +2.  PROM to LUE completed in supine, demonstrating minimal L UE shoulder movement given increased time and cueing in shoulder elevation and forward flexion but requires cueing to attend to UE.  CIR remains appropriate. Will follow acutely.    Follow Up Recommendations  CIR;Supervision/Assistance - 24 hour    Equipment Recommendations  None recommended by OT    Recommendations for Other Services Rehab consult    Precautions / Restrictions Precautions Precautions: Fall Precaution Comments: L neglect; R wrist restraint Restrictions Weight Bearing Restrictions: No       Mobility Bed Mobility Overal bed mobility: Needs Assistance Bed Mobility: Supine to Sit;Sit to Supine     Supine to sit: Max assist;+2 for physical assistance;+2 for safety/equipment;HOB elevated Sit to supine: Min assist;+2 for safety/equipment   General  bed mobility comments: patient transitioned towards L side with max assist+2 able to reach towards rail to assist with R UE, returned to supine with min assist given increased time    Transfers Overall transfer level: Needs assistance Equipment used: 2 person hand held assist Transfers: Sit to/from Stand Sit to Stand: Mod assist;Min assist;+2 physical assistance;+2 safety/equipment         General transfer comment: initally mod assist +2 to power up and steady from EOB, fading to min assist +2 on 3rd trial    Balance Overall balance assessment: Needs assistance Sitting-balance support: Feet supported;No upper extremity supported Sitting balance-Leahy Scale: Poor Sitting balance - Comments: min assist to min guard statically   Standing balance support: Bilateral upper extremity supported;During functional activity Standing balance-Leahy Scale: Poor Standing balance comment: relies on UE and external support                           ADL either performed or assessed with clinical judgement   ADL Overall ADL's : Needs assistance/impaired     Grooming: Wash/dry face;Minimal assistance;Bed level               Lower Body Dressing: Maximal assistance;+2 for physical assistance;+2 for safety/equipment;Sit to/from stand Lower Body Dressing Details (indicate cue type and reason): requires assist for socks, min-mod +2 sit to stand Toilet Transfer: Minimal assistance;+2 for physical assistance;+2 for safety/equipment;Ambulation Toilet Transfer Details (indicate cue type and reason): simulated in room         Functional mobility during ADLs: Minimal assistance;Moderate assistance;+2 for physical assistance;+2 for safety/equipment;Cueing for sequencing;Cueing  for safety General ADL Comments: pt remains limited by L sided weakness/inattention, decreased activity tolerance and lethargy     Vision   Additional Comments: patient requires max cueing to open eyes and engage  initally, once upright able to keep eyes open and track to read date on wall   Perception     Praxis      Cognition Arousal/Alertness: Lethargic Behavior During Therapy: Flat affect Overall Cognitive Status: Impaired/Different from baseline Area of Impairment: Memory;Attention;Following commands;Safety/judgement;Awareness;Problem solving;Orientation                 Orientation Level: Time;Disoriented to Current Attention Level: Sustained Memory: Decreased short-term memory Following Commands: Follows one step commands with increased time;Follows multi-step commands inconsistently;Follows one step commands inconsistently Safety/Judgement: Decreased awareness of safety;Decreased awareness of deficits Awareness: Intellectual Problem Solving: Slow processing;Decreased initiation;Difficulty sequencing;Requires verbal cues;Requires tactile cues General Comments: pt lethargic upon entry, improved alertness with mobilitzation OOB. He follows most 1 step commands with increased time, but inconsistent whether he responds or not.; reports feb 2022, able to correct once therapist pointed out date on whiteboard        Exercises     Shoulder Instructions       General Comments      Pertinent Vitals/ Pain       Pain Assessment: No/denies pain  Home Living                                          Prior Functioning/Environment              Frequency  Min 2X/week        Progress Toward Goals  OT Goals(current goals can now be found in the care plan section)  Progress towards OT goals: Progressing toward goals  Acute Rehab OT Goals Patient Stated Goal: none stated  Plan Discharge plan remains appropriate;Frequency remains appropriate    Co-evaluation    PT/OT/SLP Co-Evaluation/Treatment: Yes Reason for Co-Treatment: For patient/therapist safety;Necessary to address cognition/behavior during functional activity;To address functional/ADL transfers    OT goals addressed during session: ADL's and self-care      AM-PAC OT "6 Clicks" Daily Activity     Outcome Measure   Help from another person eating meals?: Total Help from another person taking care of personal grooming?: A Lot Help from another person toileting, which includes using toliet, bedpan, or urinal?: A Lot Help from another person bathing (including washing, rinsing, drying)?: A Lot Help from another person to put on and taking off regular upper body clothing?: A Lot Help from another person to put on and taking off regular lower body clothing?: A Lot 6 Click Score: 11    End of Session Equipment Utilized During Treatment: Gait belt  OT Visit Diagnosis: Unsteadiness on feet (R26.81);Cognitive communication deficit (R41.841) Symptoms and signs involving cognitive functions: Cerebral infarction   Activity Tolerance Patient tolerated treatment well   Patient Left in bed;with call bell/phone within reach;with bed alarm set;with restraints reapplied;with SCD's reapplied   Nurse Communication Mobility status        Time: 0947-0962 OT Time Calculation (min): 32 min  Charges: OT General Charges $OT Visit: 1 Visit OT Treatments $Self Care/Home Management : 8-22 mins  Barry Brunner, OT Acute Rehabilitation Services Pager [phone] Office [phone]    Chancy Milroy 01/30/2021, 2:54 PM

## 2021-01-30 NOTE — Progress Notes (Signed)
Physical Therapy Treatment Patient Details Name: Jill Ruppe Champeau MRN: [id number] DOB: 07-Dec-1955 Today's Date: 01/30/2021    History of Present Illness Pt is a 65 y.o. male who presented 4/20 with acute onset L hemiplegia, L facial droop, dysarthria, and L hemineglect. tPA was administered. 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 S/p bil common carotid arteriograms and revascularization of occluded R MCA and R ACA prox and R ICA terminus. ETT 4/20-4/21. No significant PMH.    PT Comments    Pt required stimulation with mobility to stay awake. Is progressing with mobility and was able to amb short distance in room with 2 person assist. If arousal improves expect progress will improve.    Follow Up Recommendations  CIR;Supervision/Assistance - 24 hour     Equipment Recommendations  3in1 (PT);Wheelchair (measurements PT);Wheelchair cushion (measurements PT);Other (comment) (?hemiwalker)    Recommendations for Other Services       Precautions / Restrictions Precautions Precautions: Fall Precaution Comments: L neglect; R wrist restraint Restrictions Weight Bearing Restrictions: No    Mobility  Bed Mobility Overal bed mobility: Needs Assistance Bed Mobility: Supine to Sit;Sit to Supine     Supine to sit: Max assist;+2 for physical assistance;HOB elevated Sit to supine: Min assist;+2 for safety/equipment   General bed mobility comments: Assist to bring LUE over to rail to assist. Assist to bring legs off of bed (pt repeatedly would bring rt leg back up into the bed), elevate trunk into sitting and bring hips to EOB.    Transfers Overall transfer level: Needs assistance Equipment used: 2 person hand held assist Transfers: Sit to/from Stand Sit to Stand: Mod  assist;Min assist;+2 physical assistance;+2 safety/equipment         General transfer comment: Initial stand +2 mod assist to bring hips up and for balance. On subsequent stands improved to +2 min assist.  Ambulation/Gait Ambulation/Gait assistance: +2 physical assistance;Min assist;Mod assist Gait Distance (Feet): 10 Feet Assistive device: 2 person hand held assist Gait Pattern/deviations: Step-through pattern;Narrow base of support;Decreased stride length Gait velocity: decr Gait velocity interpretation: <1.31 ft/sec, indicative of household ambulator General Gait Details: Assist for balance and support. As pt fatigued LLE with decr step length and knee with flexion in stance and required more assist.   Stairs             Wheelchair Mobility    Modified Rankin (Stroke Patients Only) Modified Rankin (Stroke Patients Only) Pre-Morbid Rankin Score: No symptoms Modified Rankin: Moderately severe disability     Balance Overall balance assessment: Needs assistance Sitting-balance support: Feet supported;No upper extremity supported Sitting balance-Leahy Scale: Poor Sitting balance - Comments: min assist to min guard statically   Standing balance support: Bilateral upper extremity supported;During functional activity Standing balance-Leahy Scale: Poor Standing balance comment: +2 min assist with bil UE support                            Cognition Arousal/Alertness: Lethargic Behavior During Therapy: Flat affect Overall Cognitive Status: Impaired/Different from baseline Area of Impairment: Memory;Attention;Following commands;Safety/judgement;Awareness;Problem solving;Orientation                 Orientation Level: Time;Disoriented to Current Attention Level: Sustained Memory: Decreased short-term memory Following Commands: Follows one step commands with increased time;Follows  multi-step commands inconsistently;Follows one step commands  inconsistently Safety/Judgement: Decreased awareness of safety;Decreased awareness of deficits Awareness: Intellectual Problem Solving: Slow processing;Decreased initiation;Difficulty sequencing;Requires verbal cues;Requires tactile cues General Comments: pt lethargic upon entry, improved alertness with mobilitzation OOB. He follows most 1 step commands with increased time, but inconsistent whether he responds or not.; reports feb 2022, able to correct once therapist pointed out date on whiteboard. When pt not stimulated with mobility he is quickly back to sleep.      Exercises      General Comments        Pertinent Vitals/Pain Pain Assessment: No/denies pain    Home Living                      Prior Function            PT Goals (current goals can now be found in the care plan section) Acute Rehab PT Goals Patient Stated Goal: none stated Progress towards PT goals: Progressing toward goals    Frequency    Min 4X/week      PT Plan Current plan remains appropriate    Co-evaluation PT/OT/SLP Co-Evaluation/Treatment: Yes Reason for Co-Treatment: For patient/therapist safety;Necessary to address cognition/behavior during functional activity PT goals addressed during session: Mobility/safety with mobility OT goals addressed during session: ADL's and self-care      AM-PAC PT "6 Clicks" Mobility   Outcome Measure  Help needed turning from your back to your side while in a flat bed without using bedrails?: Total Help needed moving from lying on your back to sitting on the side of a flat bed without using bedrails?: Total Help needed moving to and from a bed to a chair (including a wheelchair)?: Total Help needed standing up from a chair using your arms (e.g., wheelchair or bedside chair)?: Total Help needed to walk in hospital room?: Total Help needed climbing 3-5 steps with a railing? : Total 6 Click Score: 6    End of Session Equipment Utilized During  Treatment: Gait belt Activity Tolerance: Patient limited by lethargy Patient left: in bed;with call bell/phone within reach;with bed alarm set Nurse Communication: Mobility status PT Visit Diagnosis: Unsteadiness on feet (R26.81);Other abnormalities of gait and mobility (R26.89);Muscle weakness (generalized) (M62.81);Difficulty in walking, not elsewhere classified (R26.2);Other symptoms and signs involving the nervous system (R29.898);Hemiplegia and hemiparesis Hemiplegia - Right/Left: Left Hemiplegia - caused by: Cerebral infarction     Time: 7989-2119 PT Time Calculation (min) (ACUTE ONLY): 32 min  Charges:  $Therapeutic Activity: 8-22 mins                     [facility] PT Acute Rehabilitation Services Pager [phone] Office [phone]    Angelina Ok [facility] 01/30/2021, 4:55 PM

## 2021-01-30 NOTE — Plan of Care (Signed)
  Problem: Safety: Goal: Non-violent Restraint(s) Outcome: Progressing   Problem: Education: Goal: Knowledge of disease or condition will improve Outcome: Progressing Goal: Knowledge of secondary prevention will improve Outcome: Progressing Goal: Knowledge of patient specific risk factors addressed and post discharge goals established will improve Outcome: Progressing Goal: Individualized Educational Video(s) Outcome: Progressing   Problem: Education: Goal: Knowledge of General Education information will improve Description: Including pain rating scale, medication(s)/side effects and non-pharmacologic comfort measures Outcome: Progressing   Problem: Health Behavior/Discharge Planning: Goal: Ability to manage health-related needs will improve Outcome: Progressing   Problem: Clinical Measurements: Goal: Ability to maintain clinical measurements within normal limits will improve Outcome: Progressing Goal: Will remain free from infection Outcome: Progressing Goal: Diagnostic test results will improve Outcome: Progressing Goal: Respiratory complications will improve Outcome: Progressing Goal: Cardiovascular complication will be avoided Outcome: Progressing   Problem: Activity: Goal: Risk for activity intolerance will decrease Outcome: Progressing

## 2021-01-30 NOTE — Plan of Care (Signed)
  Problem: Safety: Goal: Non-violent Restraint(s) Outcome: Progressing   Problem: Education: Goal: Knowledge of disease or condition will improve Outcome: Progressing Goal: Knowledge of secondary prevention will improve Outcome: Progressing Goal: Knowledge of patient specific risk factors addressed and post discharge goals established will improve Outcome: Progressing Goal: Individualized Educational Video(s) Outcome: Progressing   Problem: Education: Goal: Knowledge of General Education information will improve Description: Including pain rating scale, medication(s)/side effects and non-pharmacologic comfort measures Outcome: Progressing   Problem: Health Behavior/Discharge Planning: Goal: Ability to manage health-related needs will improve Outcome: Progressing   Problem: Clinical Measurements: Goal: Ability to maintain clinical measurements within normal limits will improve Outcome: Progressing Goal: Will remain free from infection Outcome: Progressing Goal: Diagnostic test results will improve Outcome: Progressing Goal: Respiratory complications will improve Outcome: Progressing Goal: Cardiovascular complication will be avoided Outcome: Progressing   Problem: Activity: Goal: Risk for activity intolerance will decrease Outcome: Progressing   Problem: Nutrition: Goal: Adequate nutrition will be maintained Outcome: Progressing   Problem: Coping: Goal: Level of anxiety will decrease Outcome: Progressing   Problem: Elimination: Goal: Will not experience complications related to bowel motility Outcome: Progressing Goal: Will not experience complications related to urinary retention Outcome: Progressing   Problem: Pain Managment: Goal: General experience of comfort will improve Outcome: Progressing   Problem: Safety: Goal: Ability to remain free from injury will improve Outcome: Progressing   Problem: Skin Integrity: Goal: Risk for impaired skin integrity  will decrease Outcome: Progressing

## 2021-01-30 NOTE — Progress Notes (Signed)
  Speech Language Pathology Treatment: Cognitive-Linquistic;Dysphagia  Patient Details Name: Leonard [name] MRN: [id number] DOB: 11-10-1955 Today's Date: 01/30/2021 Time: 0820-0850 SLP Time Calculation (min) (ACUTE ONLY): 30 min  Assessment / Plan / Recommendation Clinical Impression  Followed up for PO readiness. Pt lethargic but able to arouse with cueing and answer simple questions and follow simple commands. Provided diligent oral care as xerostomia exhibited. Pt eager for ice chips and POs. Trialed puree, nectar and honey thick liquids. Pt with left sided anterior spillage with POs and mild left oral pocketing. Pt with suspected delay in swallow initiation per palpation. Delayed coughing noted following ice chips and nectar thick liquids concerning for reduced airway protection. Pt also with intermittent wet vocal quality, though able to implement cough and volitional swallow with cues. No overt s/sx of aspiration with puree or honey thick liquids.  Recommend proceed with instrumental assessment to guide diet recommendations as risk for silent aspiration remains increased following large CVA. MBSS planned this date.    HPI HPI: Pt is a 65 y.o. male with no significant PMHx who presented via EMS as a code stroke for acute onset of left hemiplegia, left facial droop, dysarthria and left hemineglect. NIHSS was 18 on admission and tPA was given. 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 Pt s/p  s/p bilateral common carotid arteriograms followed by right MCA/ACA/ICA thrombus retrieval with stent angioplasty 4/21. Pt was intubated for procedure. CT head 4/22: Large area of hypoattenuation at the site of known right MCA territory infarct. Small focus of parenchymal hyperdensity at the posterior aspect of the infarct site Newbold indicate a small amount of petechial hemorrhage or contrast extravasation from procedure. Small amount of  subarachnoid hyperdensity over the right frontal convexity, either subarachnoid blood or contrast taining.      SLP Plan  MBS       Recommendations  Diet recommendations: NPO (pending MBSS results this date) Medication Administration: Via alternative means                Oral Care Recommendations: Oral care prior to ice chip/H20;Oral care QID Follow up Recommendations: Inpatient Rehab SLP Visit Diagnosis: Dysphagia, oral phase (R13.11);Dysphagia, unspecified (R13.10) Plan: MBS       GO                Susan Bleich H. MA, CCC-SLP Acute Rehabilitation Services   01/30/2021, 8:59 AM

## 2021-01-30 NOTE — Discharge Instructions (Signed)
Femoral Site Care This sheet gives you information about how to care for yourself after your procedure. Your health care provider Eckstrom also give you more specific instructions. If you have problems or questions, contact your health care provider. What can I expect after the procedure? After the procedure, it is common to have:  Bruising that usually fades within 1-2 weeks.  Tenderness at the site. Follow these instructions at home: Wound care 1. Follow instructions from your health care provider about how to take care of your insertion site. Make sure you: ? Wash your hands with soap and water before you change your bandage (dressing). If soap and water are not available, use hand sanitizer. ? Change your dressing as directed- pressure dressing removed 24 hours post-procedure (and switch for bandaid), bandaid removed 72 hours post-procedure 2. Do not take baths, swim, or use a hot tub for 7 days post-procedure. 3. You Nowakowski shower 48 hours after the procedure or as told by your health care provider. ? Gently wash the site with plain soap and water. ? Pat the area dry with a clean towel. ? Do not rub the site. This [place] cause bleeding. 4. Check your site every day for signs of infection. Check for: ? Redness, swelling, or pain. ? Fluid or blood. ? Warmth. ? Pus or a bad smell. Activity  Do not stoop, bend, or lift anything that is heavier than 10 lb (4.5 kg) for 2 weeks post-procedure.  Do not drive self for 2 weeks post-procedure. Contact a health care provider if you have:  A fever or chills.  You have redness, swelling, or pain around your insertion site. Get help right away if:  The catheter insertion area swells very fast.  You pass out.  You suddenly start to sweat or your skin gets clammy.  The catheter insertion area is bleeding, and the bleeding does not stop when you hold steady pressure on the area.  The area near or just beyond the catheter insertion site becomes  pale, cool, tingly, or numb. These symptoms Bau represent a serious problem that is an emergency. Do not wait to see if the symptoms will go away. Get medical help right away. Call your local emergency services (911 in the [place] Do not drive yourself to the hospital.  This information is not intended to replace advice given to you by your health care provider. Make sure you discuss any questions you have with your health care provider. Document Revised: 10/06/2017 Document Reviewed: 10/06/2017 Elsevier Patient Education  2020 [employer] 

## 2021-01-30 NOTE — Progress Notes (Signed)
NIR.  History of acute CVA s/p cerebral arteriogram with emergent mechanical thrombectomy of right MCA, proximal right ACA, and right ICA terminus occlusions achieving a TICI 3 revascularization, along with revascularization of proximal right ICA acute occlusion using stent assisted angioplasty via right femoral approach 01/25/2021 by Dr. Corliss Skains.  Recommended discharge recommendations: 1- Continue taking Brilinta 90 mg twice daily and Aspirin 81 mg once daily. 2- Plan to follow-up with Dr. Corliss Skains in 3 months for CTA head/neck along with consult- NIR schedulers to call patient to set up this appointment.  Further plans per neurology- appreciate and agree with management. Please call NIR with questions/concerns.   Waylan Boga Nancee Brownrigg, PA-C 01/30/2021, 10:04 AM

## 2021-01-30 NOTE — Progress Notes (Signed)
STROKE TEAM PROGRESS NOTE   INTERVAL HISTORY VSS. Tmax 100. UA neg, CXR with low lung volumes yesterday.  Remains lethargic but slightly more responsive today. Spoke in complete sentence in response to exam today.  Following some simple commands.  SLP planning MBSS today.   Vitals:   01/29/21 2334 01/30/21 0334 01/30/21 0532 01/30/21 0735  BP: 132/66 (!) 152/73  (!) 154/66  Pulse: 76 84  79  Resp: (!) 26 (!) 27  (!) 28  Temp: 98.8 F (37.1 C) 98.5 F (36.9 C)  99.8 F (37.7 C)  TempSrc: Axillary Oral  Axillary  SpO2: 94% 95%  95%  Weight:   79.7 kg   Height:       CBC:  Recent Labs  Lab 01/25/21 0342 01/25/21 0404 01/28/21 0336 01/29/21 1503  WBC 12.9*   < > 13.2* 11.2*  NEUTROABS 10.3*  --   --  8.9*  HGB 14.6   < > 10.5* 10.9*  HCT 42.3   < > 31.9* 33.0*  MCV 87.2   < > 91.4 92.2  PLT 239   < > 206 268   < > = values in this interval not displayed.   Basic Metabolic Panel:  Recent Labs  Lab 01/27/21 0048 01/27/21 0624 01/28/21 0336 01/28/21 1316 01/29/21 0106 01/29/21 1222  NA 139   < > 142   < > 141  143 144  K 3.6  --  3.6  --  3.8  --   CL 111  --  111  --  112*  --   CO2 21*  --  23  --  23  --   GLUCOSE 147*  --  151*  --  136*  --   BUN 11  --  14  --  20  --   CREATININE 0.74  --  0.68  --  0.79  --   CALCIUM 7.8*  --  8.0*  --  8.3*  --   MG 2.3  --  2.5*  --   --   --   PHOS 1.6*  --  3.4  --   --   --    < > = values in this interval not displayed.   Lipid Panel:  Recent Labs  Lab 01/25/21 0342 01/26/21 0441  CHOL 195  --   TRIG 156* 91  HDL 38*  --   CHOLHDL 5.1  --   VLDL 31  --   LDLCALC 126*  --    HgbA1c:  Recent Labs  Lab 01/25/21 0342  HGBA1C 5.8*   Urine Drug Screen:  Recent Labs  Lab 01/25/21 0840  LABOPIA NONE DETECTED  COCAINSCRNUR NONE DETECTED  LABBENZ NONE DETECTED  AMPHETMU NONE DETECTED  THCU NONE DETECTED  LABBARB NONE DETECTED    Alcohol Level No results for input(s): ETH in the last 168  hours.  IMAGING   CT head Result Date: 01/27/2021 Impression Expected evolution of right MCA territory infarct with unchanged multifocal intraparenchymal and subarachnoid blood. No worsened midline shift.  MRA head/MRI brain Result Date: 01/26/2021 IMPRESSION: 1. 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 No hemorrhage or mass effect. 2. Punctate foci of acute ischemia in the right temporal lobe and right thalamus. 3. Normal intracranial MRA.  DG CHEST PORT 1 VIEW Result Date: 01/25/2021 IMPRESSION:  1.  Endotracheal tube and NG tube in good anatomic position.  2.  Cardiomegaly.  No pulmonary  venous congestion.  3.  Low lung volumes with bibasilar atelectasis.    CT CEREBRAL PERFUSION W CONTRAST Result Date: 01/24/2021 IMPRESSION:  1. 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  2. 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  3. 68 mL right MCA territory core infarct with 133 mL area of ischemic penumbra.   CT HEAD CODE STROKE WO CONTRAST Result Date: 01/24/2021 IMPRESSION: 1. 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 2. Hyperdense appearance of the right MCA. 3. ASPECTS is 7.   CT ANGIO HEAD/NECK CODE STROKE Result Date: 01/24/2021 IMPRESSION:  1. 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  2. 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 3. 68 mL right MCA territory core infarct with 133 mL area of ischemic penumbra.  PHYSICAL EXAM Frail middle-age male not in distress HEENT-  [place]/AT Lungs - Respirations unlabored Extremities - Warm and well perfused  Neuro - Patient lethargic but opens eyes to voice.  He is a bit more responsive today. Spoke in full sentence. Oriented to first  and last name, place only.Moderate to severe dysarthria, followingmostsimple commands. Able to name most objects.  Mild difficulty with repeating due to cooperation. Right gaze preference able to cross midline but unable to fully track to the left,blinking to visual threat bilaterally right> left. PERRL. Left facial droop. Tongueprotrusion midline. RUE spontaneous (4+/5), RLE spontaneous (4/5), LUE 2-/5 LLE spontaneous 3/5, able to wiggle toes bilaterally. Sensation symmetrical bilaterally,  Right FTN intact. Left FTNslow, ataxia present, gait not tested.  ASSESSMENT/PLAN [name] is a 65 y.o. male with no significant PMH presenting with left hemiplegia, left facial droop, and dysarthria.  Was found to have an acute right MCA territory infarct s/p tPA and bilateral common carotid arteriograms followed by right MCA/ACA/ICA thrombus retrieval with stent angioplasty..   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 Per Dr. 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   Code Stroke Acute right MCA territory infarct ASPECTS 7   CTA head & neck: Complete occlusion of the R ICA and R MCA   CT perfusion 68 mL right MCA territory core infarct with 133 mL area of ischemic penumbra.  S/p IR with R MCA TICI3 and R carotid stenting.  MRI: large acute infarct of the right MCA territory involving the       frontal operculum, insula and basal ganglia with hemorrhagic conversion   MRA:  R MCA patent  Right carotid duplex: ICA stent appears patent  Repeat CT head (4/23): evolution of right MCA infarct. Overall unchanged   2D Echo: EF 60-65%, mild LVH  LDL 126  HgbA1c 5.8  UDS negative  VTE prophylaxis - SCDs  Diet: NPO, Tube feeding, swallow not progressing   aspirin 81 mg daily prior to admission, now on aspirin 81 mg daily and Brilinta (ticagrelor) 90 mg bid.   [place]ok consider 30-day cardiac event monitor as outpatient  to rule out A. Fib.  Therapy recommendations:  CIR but not up to 3 hours a day tolerance, rehab coordinator monitoring, Giarratano need SNF as back up plan   Disposition: pending  Carotid occlusion  CTA neck: Complete occlusion of the R ICA   S/p IR with  R carotid stenting.  Per Dr. 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  Right carotid duplex: ICA stent appears patent  On aspirin and Brilinta DAPT.  Cerebral Edema ? Chronic hyponatremia  MRI: early subacute ischemic infarction in the right MCA territory. There is swelling resulting in minimal mass effect on the right lateral ventricle.  Na 130->135->140->139->141->143  Na goal 145-150  Repeat CT head 4/23: unchanged, no worsening midline shift  Hypertension  Home meds:  none  Cleviprex restarted overnight, wean for goal SBP <160 . SBP goal now < 160 given hemorrhagic conversion . Long-term BP goal normotensive . Continue Norvasc and add labetalol.  Hyperlipidemia  Home meds:  none  LDL 126, goal < 70  Add Lipitor 80g daily  Continue statin at discharge  Dysphagia  Secondary to stroke  NPO  SLP on board, pending MBSS today   corpak placed, on TF @ 50cc  Leukocytosis Fever  WBC 9.7-12.9-18.5->13.7->12.2  Tmax 99.9, ?due to aspiration  will continue to monitor for s/s of infection  CXR:Low lung volumes with increasing mixed interstitial and airspace opacification, findings which Fanfan be due to edema, pneumonia and/or aspiration.  UA: essentially negative, trace leukocyte only   Other Stroke Risk Factors  Advanced Age >/= 35   Other Active Problems  Hypokalemia: wnl today   Hospital day # 5 I have personally obtained history,examined this patient, reviewed notes, independently viewed imaging studies, participated in medical decision making and plan of care.ROS completed by me personally and pertinent positives fully documented  I have made any additions or clarifications directly to the above note.  Agree with note above.  Continue ongoing management and therapy evaluations and transfer to rehab when bed available.  Greater than 50% time during this 25-minute visit was spent in counseling and coordination of care about his condition and discussion with care team.  Delia Heady, MD Medical Director [facility] Pager: [phone] 01/30/2021 2:14 PM   To contact Stroke Continuity provider, please refer to [email or web address]. After hours, contact General Neurology

## 2021-01-31 ENCOUNTER — Inpatient Hospital Stay (HOSPITAL_COMMUNITY): Payer: Medicare HMO

## 2021-01-31 DIAGNOSIS — I6601 Occlusion and stenosis of right middle cerebral artery: Secondary | ICD-10-CM | POA: Diagnosis not present

## 2021-01-31 DIAGNOSIS — R4182 Altered mental status, unspecified: Secondary | ICD-10-CM

## 2021-01-31 LAB — GLUCOSE, CAPILLARY
Glucose-Capillary: 113 mg/dL — ABNORMAL HIGH (ref 70–99)
Glucose-Capillary: 123 mg/dL — ABNORMAL HIGH (ref 70–99)
Glucose-Capillary: 121 mg/dL — ABNORMAL HIGH (ref 70–99)
Glucose-Capillary: 138 mg/dL — ABNORMAL HIGH (ref 70–99)
Glucose-Capillary: 122 mg/dL — ABNORMAL HIGH (ref 70–99)
Glucose-Capillary: 135 mg/dL — ABNORMAL HIGH (ref 70–99)

## 2021-01-31 NOTE — Progress Notes (Signed)
  Speech Language Pathology Treatment: Dysphagia  Patient Details Name: Leonard [name] MRN: [id number] DOB: 07-Oct-1956 Today's Date: 01/31/2021 Time: 0950-1005 SLP Time Calculation (min) (ACUTE ONLY): 15 min  Assessment / Plan / Recommendation Clinical Impression  Pt seen for ongoing dysphagia management.  SLP provided aggressive thorough care on arrival prior to PO trials.  Copious thick, brown, dried secretions throughout oral cavity.  SLP removed a large amount from hard palate.  Pt exhibited immediate and delayed cough with ice chips on 2 of 6 trials.  With final trial there was no pharyngeal swallow observed despite maximal cuing.  SLP removed some of bolus with suction.  Introduced swallowing exercises targeting oropharyngeal strength and ROM to improve swallow function.  Pt required moderate cuing and encouragement. Pt completed exercises as noted below. Recommend pt remain NPO with continuing temporary AMN at this time.  Swallowing Exercises Effortful swallow Reps: 5 Effort: Unable to assess Accuracy: Fair Comment: Final attempt, no pharyngeal swallow appreciated, suction used.  Delayed wet coughing noted.  CTAR Reps: 5 Effort: Fair Accuracy: Fair  Masako Reps: 0 Comment: Pt unable.  Could not follow directions for tongue protrusion.  Shaker Head Lift Reps: 5 Effort: Good Accuracy: Good Comment: required consistent cuing   HPI HPI: Pt is a 65 y.o. male with no significant PMHx who presented via EMS as a code stroke for acute onset of left hemiplegia, left facial droop, dysarthria and left hemineglect. NIHSS was 18 on admission and tPA was given. 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 Pt s/p  s/p bilateral common carotid arteriograms followed by right MCA/ACA/ICA thrombus retrieval with stent angioplasty 4/21. Pt was intubated for procedure. CT head 4/22: Large area of hypoattenuation at the site  of known right MCA territory infarct. Small focus of parenchymal hyperdensity at the posterior aspect of the infarct site Snedden indicate a small amount of petechial hemorrhage or contrast extravasation from procedure. Small amount of subarachnoid hyperdensity over the right frontal convexity, either subarachnoid blood or contrast taining.      SLP Plan  Continue with current plan of care       Recommendations  Diet recommendations: NPO Medication Administration: Via alternative means                Oral Care Recommendations: Oral care QID;Oral care prior to ice chip/H20;Staff/trained caregiver to provide oral care Follow up Recommendations: Inpatient Rehab SLP Visit Diagnosis: Dysphagia, oropharyngeal phase (R13.12) Plan: Continue with current plan of care       GO                Kerrie Pleasure, MA, CCC-SLP Acute Rehabilitation Services Office: [phone]  01/31/2021, 10:14 AM

## 2021-01-31 NOTE — Procedures (Signed)
Patient Name: Leonard [name]  MRN: [id number]  Epilepsy Attending: Charlsie Quest  Referring Physician/Provider: Dr Delia Heady Date: 01/31/2021 Duration: 23.32 mins  Patient history: 65yo M with R MCA infarct, with ams. EEG to evaluate for seizure  Level of alertness: Awake  AEDs during EEG study: None  Technical aspects: This EEG study was done with scalp electrodes positioned according to the 10-20 International system of electrode placement. Electrical activity was acquired at a sampling rate of 500Hz  and reviewed with a high frequency filter of 70Hz  and a low frequency filter of 1Hz . EEG data were recorded continuously and digitally stored.   Description: No clear posterior dominant rhythm was seen. EEG showed continuous generalized polymorphic mixed frequencies with predominantly to 6-9hz  Hz theta activity admixed with 2-3hz  delta slowing. Additionally there is continuous 3-5hz  theta-delta slowing in right temporal region. Hyperventilation and photic stimulation were not performed.     ABNORMALITY - Continuous slow, generalized and maximal right temporal region  IMPRESSION: This study is suggestive of cortical dysfunction arising from right temporal region, likely secondary to underlying structural abnormality as well as mild to moderate diffuse encephalopathy, nonspecific etiology. No seizures or epileptiform discharges were seen throughout the recording.  Leonard [name] 

## 2021-01-31 NOTE — Progress Notes (Signed)
EEG complete - results pending 

## 2021-01-31 NOTE — Progress Notes (Addendum)
Inpatient Rehab Admissions Coordinator:   I spoke with assigned PT who reports Pt. Fatigued after about 20 minutes of therapy. I discussed current inability to tolerate CIR level therapies with Dr. Pearlean Brownie who is in agreement that Pt. Is not ready for CIR. Pt. Currently has a coretrak and is unable to swallow, so cannot go SNF at this time. [facility] team will continue to follow and monitor for ability to tolerate CIR-level therapies and pursue insurance auth if he becomes able to tolerate.  I also spoke with Pt.'s wife to discuss support at discharge and stressed the fact that if Pt. Is to come to CIR there must be a plan in place for her to discharge home with 24/7 support, as he will not be able to go to SNF after CIR.   Megan Salon, MS, CCC-SLP Rehab Admissions Coordinator  [phone] (celll) [phone] (office)

## 2021-01-31 NOTE — Progress Notes (Signed)
Physical Therapy Treatment Patient Details Name: Leonard [name] MRN: [id number] DOB: 1956-10-02 Today's Date: 01/31/2021    History of Present Illness Pt is a 65 y.o. male who presented 4/20 with acute onset L hemiplegia, L facial droop, dysarthria, and L hemineglect. tPA was administered. 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 S/p bil common carotid arteriograms and revascularization of occluded R MCA and R ACA prox and R ICA terminus. ETT 4/20-4/21. No significant PMH.    PT Comments    Patient easily fatigued this session. Patient progressing with mobility but continues to require +2 assist to complete. Patient continues to demo L neglect with cues for attending to L side but patient unable to identify his own L side this session. Continue to recommend comprehensive inpatient rehab (CIR) for post-acute therapy needs.     Follow Up Recommendations  CIR;Supervision/Assistance - 24 hour     Equipment Recommendations  3in1 (PT);Wheelchair (measurements PT);Wheelchair cushion (measurements PT);Other (comment) (hemiwalker?)    Recommendations for Other Services       Precautions / Restrictions Precautions Precautions: Fall Precaution Comments: L neglect; R wrist restraint Restrictions Weight Bearing Restrictions: No    Mobility  Bed Mobility Overal bed mobility: Needs Assistance Bed Mobility: Supine to Sit;Sit to Supine     Supine to sit: Mod assist;+2 for physical assistance;+2 for safety/equipment Sit to supine: Min assist;+2 for safety/equipment   General bed mobility comments: assist bringing L LE off bed and trunk elevation. Able to initiate trunk elevation but required modA+2 to complete.    Transfers Overall transfer level: Needs assistance Equipment  used: 2 person hand held assist Transfers: Sit to/from [employer] Sit to Stand: Min assist;+2 physical assistance;+2 safety/equipment Stand pivot transfers: Min assist;+2 physical assistance;+2 safety/equipment       General transfer comment: minA+2 to rise into standing from low surface and steadying. Completed stand pivot towards R with minA+2  Ambulation/Gait Ambulation/Gait assistance: Mod assist;+2 safety/equipment Gait Distance (Feet): 7 Feet Assistive device: 1 person hand held assist;IV Pole Gait Pattern/deviations: Step-through pattern;Narrow base of support;Decreased stride length Gait velocity: decr   General Gait Details: R hand on IV pole and HHA on L. Assist required for balance and cues required for increasing step length as patient with shuffle steps.   Stairs             Wheelchair Mobility    Modified Rankin (Stroke Patients Only) Modified Rankin (Stroke Patients Only) Pre-Morbid Rankin Score: No symptoms Modified Rankin: Moderately severe disability     Balance Overall balance assessment: Needs assistance Sitting-balance support: Feet supported;No upper extremity supported Sitting balance-Leahy Scale: Poor Sitting balance - Comments: min guard for safety. One instance of minA Postural control: Posterior lean Standing balance support: Bilateral upper extremity supported;During functional activity Standing balance-Leahy Scale: Poor Standing balance comment: +2 min assist with bil UE support                            Cognition Arousal/Alertness: Awake/alert Behavior During Therapy: Flat affect Overall Cognitive Status: Impaired/Different from baseline Area of Impairment: Memory;Attention;Following commands;Safety/judgement;Awareness;Problem solving;Orientation                 Orientation Level: Disoriented to;Time Current Attention Level: Sustained Memory: Decreased short-term memory Following Commands:  Follows one step commands inconsistently;Follows one step commands with increased time Safety/Judgement: Decreased awareness of safety;Decreased awareness of deficits Awareness: Intellectual Problem Solving: Slow processing;Decreased initiation;Difficulty sequencing;Requires verbal cues;Requires tactile cues General Comments: Improved alertness this session. Patient able to follow 1 step commands with increased time but inconsistently. Difficulty differentiating R/L but also neglects L      Exercises      General Comments        Pertinent Vitals/Pain Pain Assessment: Faces Faces Pain Scale: No hurt Pain Intervention(s): Monitored during session    Home Living                      Prior Function            PT Goals (current goals can now be found in the care plan section) Acute Rehab PT Goals Patient Stated Goal: none stated PT Goal Formulation: With patient/family Time For Goal Achievement: 02/09/21 Potential to Achieve Goals: Good Progress towards PT goals: Progressing toward goals    Frequency    Min 4X/week      PT Plan Current plan remains appropriate    Co-evaluation              AM-PAC PT "6 Clicks" Mobility   Outcome Measure  Help needed turning from your back to your side while in a flat bed without using bedrails?: Total Help needed moving from lying on your back to sitting on the side of a flat bed without using bedrails?: Total Help needed moving to and from a bed to a chair (including a wheelchair)?: Total Help needed standing up from a chair using your arms (e.g., wheelchair or bedside chair)?: Total Help needed to walk in hospital room?: Total Help needed climbing 3-5 steps with a railing? : Total 6 Click Score: 6    End of Session Equipment Utilized During Treatment: Gait belt Activity Tolerance: Patient tolerated treatment well Patient left: in bed;with call bell/phone within reach;with bed alarm set Nurse Communication:  Mobility status PT Visit Diagnosis: Unsteadiness on feet (R26.81);Other abnormalities of gait and mobility (R26.89);Muscle weakness (generalized) (M62.81);Difficulty in walking, not elsewhere classified (R26.2);Other symptoms and signs involving the nervous system (R29.898);Hemiplegia and hemiparesis Hemiplegia - Right/Left: Left Hemiplegia - caused by: Cerebral infarction     Time: 1330-1408 PT Time Calculation (min) (ACUTE ONLY): 38 min  Charges:  $Gait Training: 23-37 mins $Therapeutic Activity: 8-22 mins                     Viggo Perko A. Dan Humphreys PT, DPT Acute Rehabilitation Services Pager [phone] Office [phone]    Viviann Spare 01/31/2021, 4:57 PM

## 2021-01-31 NOTE — Plan of Care (Signed)
  Problem: Safety: Goal: Non-violent Restraint(s) Outcome: Progressing   Problem: Education: Goal: Knowledge of disease or condition will improve Outcome: Progressing Goal: Knowledge of secondary prevention will improve Outcome: Progressing Goal: Knowledge of patient specific risk factors addressed and post discharge goals established will improve Outcome: Progressing Goal: Individualized Educational Video(s) Outcome: Progressing

## 2021-01-31 NOTE — Progress Notes (Signed)
STROKE TEAM PROGRESS NOTE   INTERVAL HISTORY  . Tmax 99.9.  Remains sleepy and can be aroused with some difficulty.    Following some simple commands.  Neurological exam unchanged.  Vital signs stable. SLP yet recommends n.p.o. by mouth as patient did not do well on modified barium Rehab team feels patient too sleepy and not able to work enough with therapies to go to inpatient rehab Vitals:   01/31/21 0313 01/31/21 0400 01/31/21 0747 01/31/21 1142  BP: (!) 144/69  (!) 156/75 131/61  Pulse: 74  82 84  Resp: (!) 21  (!) 25 (!) 30  Temp: 99.4 F (37.4 C)  97.9 F (36.6 C) 99.9 F (37.7 C)  TempSrc: Axillary  Oral Axillary  SpO2: 97%  95%   Weight:  79.7 kg    Height:       CBC:  Recent Labs  Lab 01/29/21 1503 01/30/21 0820  WBC 11.2* 12.2*  NEUTROABS 8.9* 9.5*  HGB 10.9* 11.6*  HCT 33.0* 35.7*  MCV 92.2 93.2  PLT 268 295   Basic Metabolic Panel:  Recent Labs  Lab 01/27/21 0048 01/27/21 0624 01/28/21 0336 01/28/21 1316 01/29/21 0106 01/29/21 1222 01/30/21 0820  NA 139   < > 142   < > 141  143 144 143  K 3.6  --  3.6  --  3.8  --  4.0  CL 111  --  111  --  112*  --  110  CO2 21*  --  23  --  23  --  24  GLUCOSE 147*  --  151*  --  136*  --  127*  BUN 11  --  14  --  20  --  21  CREATININE 0.74  --  0.68  --  0.79  --  0.80  CALCIUM 7.8*  --  8.0*  --  8.3*  --  8.4*  MG 2.3  --  2.5*  --   --   --   --   PHOS 1.6*  --  3.4  --   --   --   --    < > = values in this interval not displayed.   Lipid Panel:  Recent Labs  Lab 01/25/21 0342 01/26/21 0441  CHOL 195  --   TRIG 156* 91  HDL 38*  --   CHOLHDL 5.1  --   VLDL 31  --   LDLCALC 126*  --    HgbA1c:  Recent Labs  Lab 01/25/21 0342  HGBA1C 5.8*   Urine Drug Screen:  Recent Labs  Lab 01/25/21 0840  LABOPIA NONE DETECTED  COCAINSCRNUR NONE DETECTED  LABBENZ NONE DETECTED  AMPHETMU NONE DETECTED  THCU NONE DETECTED  LABBARB NONE DETECTED    Alcohol Level No results for input(s): ETH in the  last 168 hours.  IMAGING   CT head Result Date: 01/27/2021 Impression Expected evolution of right MCA territory infarct with unchanged multifocal intraparenchymal and subarachnoid blood. No worsened midline shift.  MRA head/MRI brain Result Date: 01/26/2021 IMPRESSION: 1. 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 No hemorrhage or mass effect. 2. Punctate foci of acute ischemia in the right temporal lobe and right thalamus. 3. Normal intracranial MRA.  DG CHEST PORT 1 VIEW Result Date: 01/25/2021 IMPRESSION:  1.  Endotracheal tube and NG tube in good anatomic position.  2.  Cardiomegaly.  No pulmonary venous congestion.  3.  Low lung  volumes with bibasilar atelectasis.    CT CEREBRAL PERFUSION W CONTRAST Result Date: 01/24/2021 IMPRESSION:  1. 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  2. 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  3. 68 mL right MCA territory core infarct with 133 mL area of ischemic penumbra.   CT HEAD CODE STROKE WO CONTRAST Result Date: 01/24/2021 IMPRESSION: 1. 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 2. Hyperdense appearance of the right MCA. 3. ASPECTS is 7.   CT ANGIO HEAD/NECK CODE STROKE Result Date: 01/24/2021 IMPRESSION:  1. 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  2. 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 3. 68 mL right MCA territory core infarct with 133 mL area of ischemic penumbra.  PHYSICAL EXAM Frail middle-age male not in distress HEENT-  [name] Lungs - Respirations unlabored Extremities - Warm and well perfused  Neuro - Patient lethargic but opens eyes to voice.  He is a bit more responsive today. Spoke in full sentence. Oriented  to first and last name, place only.Moderate to severe dysarthria, followingmostsimple commands. Able to name most objects.  Mild difficulty with repeating due to cooperation. Right gaze preference able to cross midline but unable to fully track to the left,blinking to visual threat bilaterally right> left. PERRL. Left facial droop. Tongueprotrusion midline. RUE spontaneous (4+/5), RLE spontaneous (4/5), LUE 2-/5 LLE spontaneous 3/5, able to wiggle toes bilaterally. Sensation symmetrical bilaterally,  Right FTN intact. Left FTNslow, ataxia present, gait not tested.  ASSESSMENT/PLAN Leonard [name] is a 65 y.o. male with no significant PMH presenting with left hemiplegia, left facial droop, and dysarthria.  Was found to have an acute right MCA territory infarct s/p tPA and bilateral common carotid arteriograms followed by right MCA/ACA/ICA thrombus retrieval with stent angioplasty..   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 Per Dr. 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   Code Stroke Acute right MCA territory infarct ASPECTS 7   CTA head & neck: Complete occlusion of the R ICA and R MCA   CT perfusion 68 mL right MCA territory core infarct with 133 mL area of ischemic penumbra.  S/p IR with R MCA TICI3 and R carotid stenting.  MRI: large acute infarct of the right MCA territory involving the       frontal operculum, insula and basal ganglia with hemorrhagic conversion   MRA:  R MCA patent  Right carotid duplex: ICA stent appears patent  Repeat CT head (4/23): evolution of right MCA infarct. Overall unchanged   2D Echo: EF 60-65%, mild LVH  LDL 126  HgbA1c 5.8  UDS negative  VTE prophylaxis - SCDs  Diet: NPO, Tube feeding, swallow not progressing   aspirin 81 mg daily prior to admission, now on aspirin 81 mg daily and Brilinta (ticagrelor) 90 mg bid.   Chappuis consider 30-day cardiac event monitor as  outpatient to rule out A. Fib.  Therapy recommendations:  CIR but not up to 3 hours a day tolerance, rehab coordinator monitoring, [place] need SNF as back up plan   Disposition: pending  Carotid occlusion  CTA neck: Complete occlusion of the R ICA   S/p IR with R carotid stenting.  Per Dr. 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  Right carotid duplex: ICA stent appears patent  On aspirin and Brilinta DAPT.  Cerebral Edema ? Chronic hyponatremia  MRI: early subacute ischemic infarction in the right MCA territory. There is swelling resulting in minimal mass effect on the right lateral ventricle.  Na 130->135->140->139->141->143  Na goal 145-150  Repeat CT head 4/23: unchanged, no worsening midline shift  Hypertension  Home meds:  none  Cleviprex restarted overnight, wean for goal SBP <160 . SBP goal now < 160 given hemorrhagic conversion . Long-term BP goal normotensive . Continue Norvasc and add labetalol.  Hyperlipidemia  Home meds:  none  LDL 126, goal < 70  Add Lipitor 80g daily  Continue statin at discharge  Dysphagia  Secondary to stroke  NPO  SLP on board, pending MBSS today   corpak placed, on TF @ 50cc  Leukocytosis Fever  WBC 9.7-12.9-18.5->13.7->12.2  Tmax 99.9, ?due to aspiration  will continue to monitor for s/s of infection  CXR:Low lung volumes with increasing mixed interstitial and airspace opacification, findings which Dewan be due to edema, pneumonia and/or aspiration.  UA: essentially negative, trace leukocyte only   Other Stroke Risk Factors  Advanced Age >/= 23   Other Active Problems  Hypokalemia: wnl today   Hospital day # 6 Patient remains sleepy and lethargic which appears to be hampering his ability to participate adequately with therapy.  Recommend check EEG for seizure activity and if negative Kesecker consider trial of amantadine to promote wakefulness.  Continue tube feedings.  Rehab decision will have to wait till  patient is able to participate more in therapy.  Discussed with rehab coordinator greater than 50% time during this 25-minute visit was spent in counseling and coordination of care about his condition and discussion with care team.  Delia Heady, MD Medical Director [facility] Pager: [phone] 01/31/2021 2:29 PM   To contact Stroke Continuity provider, please refer to [email or web address]. After hours, contact General Neurology

## 2021-01-31 NOTE — Progress Notes (Addendum)
Modified Barium Swallow Progress Note Late entry from 01/30/2021 Note populated by Berdine Dance, MA, CCC-SLP.  Evaluation and documentation completed on 01/30/2021 by Jeani Sow, MA, CCC-SLP  Patient Details  Name: Leonard [name] MRN: [id number] Date of Birth: 06-20-1956  Modified Barium Swallow completed.  Full report located under Chart Review in the Imaging Section.  Brief recommendations include the following:  Clinical Impression Pt presents with a moderate oral and moderate to severe pharyngeal dysphagia with sensory and motor deficits of neurogenic etiology. Oral deficit impairments included reduced left facial, labial, and lingual strength and ROM with decreased sensation. This allowed for reduced left sided anterior spillage, reduced bolus cohesion, and oral residuals. Pharyngeal deficits c/b reduced timing and efficiency of laryngeal vestibule closure, incomplete epiglottic deflection, reduced pharyngeal constriction, decreased base of tongue retraction, diminished sensation, and decreased UES distention. This resulted in pre, during, and post swallow penetration of all consistencies trialed to the level of the true vocal cords (puree, nectar thick, and honey thick liquids). Pt with incosistent sensation of reduced airway protection events. Cues for cough and or throat clear aided in expelling penetrates. Moderate valleuclar and pyriform sinsus residuals noted with thicker POs. Pt very high aspiration risk for POs across a meal given deficits noted this date. Recommend continue short term alternative nutrition and NPO with ice chips following diligent oral care and intensive swallowing therapy. SLP to follow up.     Swallow Evaluation Recommendations      SLP Diet Recommendations NPO  Medication Administration Via alternative means   Oral Care Recommendations Oral care QID   Follow up Recommendations Inpatient Rehab   Speech Therapy Frequency (ACUTE ONLY) min 3x week  Treatment  Duration 2 weeks                                   Deonne Rooks E Delois Tolbert 01/31/2021,9:27 AM

## 2021-01-31 NOTE — Discharge Summary (Signed)
STROKE TEAM PROGRESS NOTE   INTERVAL HISTORY Temp 100.9 last pm. WBC 15 this morning. EEG negative.  Recommended to remain NPO by ST. Alertness slightly improved today but remains inattentive overall.  No visitors at bedside.    Vitals:   01/31/21 2315 02/01/21 0315 02/01/21 0804 02/01/21 1142  BP: 137/70 (!) 147/78 (!) 167/74 115/65  Pulse: 84 84 93 74  Resp: 20 20 (!) 25 (!) 30  Temp: 98.8 F (37.1 C) 99.3 F (37.4 C) 99.9 F (37.7 C) 98.9 F (37.2 C)  TempSrc: Axillary Axillary Axillary Axillary  SpO2: 91% 94% 91% 92%  Weight:      Height:       CBC:  Recent Labs  Lab 01/30/21 0820 02/01/21 0350  WBC 12.2* 15.5*  NEUTROABS 9.5* 12.1*  HGB 11.6* 10.8*  HCT 35.7* 33.7*  MCV 93.2 93.4  PLT 295 366   Basic Metabolic Panel:  Recent Labs  Lab 01/27/21 0048 01/27/21 0624 01/28/21 0336 01/28/21 1316 01/30/21 0820 02/01/21 0350  NA 139   < > 142   < > 143 145  K 3.6  --  3.6   < > 4.0 3.6  CL 111  --  111   < > 110 113*  CO2 21*  --  23   < > 24 21*  GLUCOSE 147*  --  151*   < > 127* 134*  BUN 11  --  14   < > 21 25*  CREATININE 0.74  --  0.68   < > 0.80 0.72  CALCIUM 7.8*  --  8.0*   < > 8.4* 8.6*  MG 2.3  --  2.5*  --   --   --   PHOS 1.6*  --  3.4  --   --   --    < > = values in this interval not displayed.   Lipid Panel:  Recent Labs  Lab 01/26/21 0441  TRIG 91   HgbA1c:  No results for input(s): HGBA1C in the last 168 hours. Urine Drug Screen:  No results for input(s): LABOPIA, COCAINSCRNUR, LABBENZ, AMPHETMU, THCU, LABBARB in the last 168 hours.  Alcohol Level No results for input(s): ETH in the last 168 hours.  IMAGING   CT head Result Date: 01/27/2021 Impression Expected evolution of right MCA territory infarct with unchanged multifocal intraparenchymal and subarachnoid blood. No worsened midline shift.  MRA head/MRI brain Result Date: 01/26/2021 IMPRESSION: 1. Large acute infarct of the right MCA territory involving the frontal  operculum, insula and basal ganglia. No hemorrhage or mass effect. 2. Punctate foci of acute ischemia in the right temporal lobe and right thalamus. 3. Normal intracranial MRA.  DG CHEST PORT 1 VIEW Result Date: 01/25/2021 IMPRESSION:  1.  Endotracheal tube and NG tube in good anatomic position.  2.  Cardiomegaly.  No pulmonary venous congestion.  3.  Low lung volumes with bibasilar atelectasis.    CT CEREBRAL PERFUSION W CONTRAST Result Date: 01/24/2021 IMPRESSION:  1. 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  2. 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  3. 68 mL right MCA territory core infarct with 133 mL area of ischemic penumbra.   CT HEAD CODE STROKE WO CONTRAST Result Date: 01/24/2021 IMPRESSION: 1. 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 2. Hyperdense appearance of the right MCA. 3. ASPECTS is 7.   CT ANGIO  HEAD/NECK CODE STROKE Result Date: 01/24/2021 IMPRESSION:  1. 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  2. 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 3. 68 mL right MCA territory core infarct with 133 mL area of ischemic penumbra.  PHYSICAL EXAM Frail middle-age male not in distress HEENT-  [place]/AT Lungs - Respirations unlabored Extremities - Warm and well perfused  Neuro - Patient lethargic but opens eyes to voice.  He is a bit more responsive today. Spoke in full sentence. Oriented to first and last name, place only.Moderate to severe dysarthria, followingmostsimple commands. Able to name most objects.  Mild difficulty with repeating due to cooperation. Right gaze preference able to cross midline but unable to fully track to the left,blinking to visual threat bilaterally right> left. PERRL. Left facial droop. Tongueprotrusion  midline. RUE spontaneous (4+/5), RLE spontaneous (4/5), LUE 2-/5 LLE spontaneous 3/5, able to wiggle toes bilaterally. Sensation symmetrical bilaterally,  Right FTN intact. Left FTNslow, ataxia present, gait not tested.  ASSESSMENT/PLAN Leonard [name] is a 65 y.o. male with no significant PMH presenting with left hemiplegia, left facial droop, and dysarthria.  Was found to have an acute right MCA territory infarct s/p tPA and bilateral common carotid arteriograms followed by right MCA/ACA/ICA thrombus retrieval with stent angioplasty..   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 Per Dr. 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   Code Stroke Acute right MCA territory infarct ASPECTS 7   CTA head & neck: Complete occlusion of the R ICA and R MCA   CT perfusion 68 mL right MCA territory core infarct with 133 mL area of ischemic penumbra.  S/p IR with R MCA TICI3 and R carotid stenting.  MRI: large acute infarct of the right MCA territory involving the       frontal operculum, insula and basal ganglia with hemorrhagic conversion   MRA:  R MCA patent  Right carotid duplex: ICA stent appears patent  Repeat CT head (4/23): evolution of right MCA infarct. Overall unchanged   2D Echo: EF 60-65%, mild LVH  LDL 126  HgbA1c 5.8  UDS negative  VTE prophylaxis - SCDs  Diet: NPO, Tube feeding, swallow not progressing. Consider PEG placement   aspirin 81 mg daily prior to admission, now on aspirin 81 mg daily and Brilinta (ticagrelor) 90 mg bid.   [place] consider 30-day cardiac event monitor as outpatient to rule out A. Fib.  Therapy recommendations:  CIR but not up to 3 hours a day tolerance, rehab coordinator monitoring, [place] need SNF as back up plan   Disposition: pending  Carotid occlusion  CTA neck: Complete occlusion of the R ICA   S/p IR with R carotid stenting.  Per Dr. Corliss Skains, ICA occlusion due to  ASVD.  Right carotid duplex: ICA stent appears patent  On aspirin and Brilinta DAPT.  Cerebral Edema ? Chronic hyponatremia  MRI: early subacute ischemic infarction in the right MCA territory. There is swelling resulting in minimal mass effect on the right lateral ventricle.  Na 130->135->140->139->141->143  Na goal 145-150  Repeat CT head 4/23: unchanged, no worsening midline shift  Hypertension  Home meds:  none  Cleviprex restarted overnight, wean for goal SBP <160 . SBP goal now < 160 given hemorrhagic conversion . Long-term BP goal normotensive . Continue Norvasc and add labetalol.  Hyperlipidemia  Home meds:  none  LDL 126, goal < 70  Add Lipitor 80g daily  Continue statin at discharge  Dysphagia  Secondary to stroke  NPO  SLP on board, pending MBSS today   corpak placed, on TF @ 50cc  Consider PEG placement   Leukocytosis Fever  WBC 9.7-12.9-18.5->13.7->12.2->15.5  Tmax 100.9, ?due to aspiration, poor cough effort  will continue to monitor for s/s of infec tion  TMH:[id number]  UA: Pending, Urine Culture Pending  Other Stroke Risk Factors  Advanced Age >/= 67   Other Active Problems  Hypokalemia: wnl today   Hospital day # 7  Continue ongoing treatment.  Check chest x-ray and UA for fever and rising white count.  Continue ongoing therapies.  Transfer to rehab after insurance approval and when bed available.  Greater than 50% time during this 25-minute visit was spent on counseling and coordination of care about his stroke and he will need little blood count in this patient care team. Delia Heady, MD  To contact Stroke Continuity provider, please refer to [email or web address]. After hours, contact General Neurology

## 2021-01-31 NOTE — Progress Notes (Signed)
Inpatient Rehab Admissions Coordinator:   Somnolence/lethargy remains a barrier to CIR admission. I have reached out to assigned PT today to see if she really feels that Pt. Could tolerate 3 hours of therapy with current level lf alertness.   Megan Salon, MS, CCC-SLP Rehab Admissions Coordinator  [phone] (celll) [phone] (office)

## 2021-02-01 ENCOUNTER — Inpatient Hospital Stay (HOSPITAL_COMMUNITY): Payer: Medicare HMO

## 2021-02-01 DIAGNOSIS — I69391 Dysphagia following cerebral infarction: Secondary | ICD-10-CM | POA: Diagnosis not present

## 2021-02-01 LAB — URINALYSIS, ROUTINE W REFLEX MICROSCOPIC
Bilirubin Urine: NEGATIVE
Glucose, UA: NEGATIVE mg/dL
Hgb urine dipstick: NEGATIVE
Ketones, ur: NEGATIVE mg/dL
Leukocytes,Ua: NEGATIVE
Nitrite: NEGATIVE
Protein, ur: NEGATIVE mg/dL
Specific Gravity, Urine: 1.019 (ref 1.005–1.030)
pH: 6 (ref 5.0–8.0)

## 2021-02-01 LAB — CBC WITH DIFFERENTIAL/PLATELET
Abs Immature Granulocytes: 0.1 10*3/uL — ABNORMAL HIGH (ref 0.00–0.07)
Basophils Absolute: 0 10*3/uL (ref 0.0–0.1)
Basophils Relative: 0 %
Eosinophils Absolute: 0.2 10*3/uL (ref 0.0–0.5)
Eosinophils Relative: 1 %
HCT: 33.7 % — ABNORMAL LOW (ref 39.0–52.0)
Hemoglobin: 10.8 g/dL — ABNORMAL LOW (ref 13.0–17.0)
Immature Granulocytes: 1 %
Lymphocytes Relative: 8 %
Lymphs Abs: 1.2 10*3/uL (ref 0.7–4.0)
MCH: 29.9 pg (ref 26.0–34.0)
MCHC: 32 g/dL (ref 30.0–36.0)
MCV: 93.4 fL (ref 80.0–100.0)
Monocytes Absolute: 1.9 10*3/uL — ABNORMAL HIGH (ref 0.1–1.0)
Monocytes Relative: 12 %
Neutro Abs: 12.1 10*3/uL — ABNORMAL HIGH (ref 1.7–7.7)
Neutrophils Relative %: 78 %
Platelets: 366 10*3/uL (ref 150–400)
RBC: 3.61 MIL/uL — ABNORMAL LOW (ref 4.22–5.81)
RDW: 12.7 % (ref 11.5–15.5)
WBC: 15.5 10*3/uL — ABNORMAL HIGH (ref 4.0–10.5)
nRBC: 0 % (ref 0.0–0.2)

## 2021-02-01 LAB — GLUCOSE, CAPILLARY
Glucose-Capillary: 124 mg/dL — ABNORMAL HIGH (ref 70–99)
Glucose-Capillary: 134 mg/dL — ABNORMAL HIGH (ref 70–99)
Glucose-Capillary: 137 mg/dL — ABNORMAL HIGH (ref 70–99)
Glucose-Capillary: 128 mg/dL — ABNORMAL HIGH (ref 70–99)
Glucose-Capillary: 122 mg/dL — ABNORMAL HIGH (ref 70–99)
Glucose-Capillary: 145 mg/dL — ABNORMAL HIGH (ref 70–99)

## 2021-02-01 LAB — BASIC METABOLIC PANEL
Anion gap: 11 (ref 5–15)
BUN: 25 mg/dL — ABNORMAL HIGH (ref 8–23)
CO2: 21 mmol/L — ABNORMAL LOW (ref 22–32)
Calcium: 8.6 mg/dL — ABNORMAL LOW (ref 8.9–10.3)
Chloride: 113 mmol/L — ABNORMAL HIGH (ref 98–111)
Creatinine, Ser: 0.72 mg/dL (ref 0.61–1.24)
GFR, Estimated: >60 mL/min (ref >60)
Glucose, Bld: 134 mg/dL — ABNORMAL HIGH (ref 70–99)
Potassium: 3.6 mmol/L (ref 3.5–5.1)
Sodium: 145 mmol/L (ref 135–145)

## 2021-02-01 MED ORDER — FREE WATER
100.0000 mL | Freq: Four times a day (QID) | Status: DC
  Administered 2021-02-01 – 2021-02-02 (×5): 100 mL

## 2021-02-01 MED ORDER — ALBUTEROL SULFATE (2.5 MG/3ML) 0.083% IN NEBU
2.5000 mg | INHALATION_SOLUTION | Freq: Four times a day (QID) | RESPIRATORY_TRACT | Status: DC | PRN
  Administered 2021-02-01: 2.5 mg via RESPIRATORY_TRACT
  Filled 2021-02-01 (×2): qty 3

## 2021-02-01 MED ORDER — SODIUM CHLORIDE 0.9 % IV SOLN
3.0000 g | Freq: Three times a day (TID) | INTRAVENOUS | Status: DC
  Administered 2021-02-01 – 2021-02-02 (×4): 3 g via INTRAVENOUS
  Filled 2021-02-01: qty 3
  Filled 2021-02-01: qty 8
  Filled 2021-02-01: qty 3
  Filled 2021-02-01: qty 8
  Filled 2021-02-01: qty 3

## 2021-02-01 NOTE — Progress Notes (Signed)
Physical Therapy Treatment Patient Details Name: Leonard [name] MRN: [id number] DOB: 06-Jan-1956 Today's Date: 02/01/2021    History of Present Illness 65 y.o. male who presented 4/20 with acute onset L hemiplegia, L facial droop, dysarthria, and L hemineglect. tPA was administered. 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 S/p bil common carotid arteriograms and revascularization of occluded R MCA and R ACA prox and R ICA terminus. ETT 4/20-4/21. No significant PMH.    PT Comments    Patient requiring min-modA+2 for OOB mobility with HHAx2. Patient requiring cues for attention, awareness, and problem solving. Patient continues to be limited by decreased activity tolerance, impaired balance, decreased L LE/UE strength, and impaired cognition. Continue to recommend comprehensive inpatient rehab (CIR) for post-acute therapy needs.    Follow Up Recommendations  CIR;Supervision/Assistance - 24 hour     Equipment Recommendations  3in1 (PT);Wheelchair (measurements PT);Wheelchair cushion (measurements PT);Other (comment) (hemiwalker)    Recommendations for Other Services       Precautions / Restrictions Precautions Precautions: Fall Precaution Comments: L neglect; R wrist restraint Restrictions Weight Bearing Restrictions: No    Mobility  Bed Mobility Overal bed mobility: Needs Assistance Bed Mobility: Supine to Sit     Supine to sit: Mod assist;+2 for physical assistance;+2 for safety/equipment     General bed mobility comments: Bringing LLE towards EOB and then elevating trunk.    Transfers Overall transfer level: Needs assistance Equipment used: 2 person hand held assist Transfers: Sit to/from Stand Sit to Stand: Min assist;+2 physical assistance;+2  safety/equipment         General transfer comment: Min A +2 for safety, balance, and power up.  Ambulation/Gait Ambulation/Gait assistance: Mod assist;+2 safety/equipment;+2 physical assistance Gait Distance (Feet): 20 Feet Assistive device: 2 person hand held assist Gait Pattern/deviations: Step-through pattern;Narrow base of support;Decreased stride length Gait velocity: decr   General Gait Details: HHA x 2 with modA for balance. Gait quality improved with one person face to face with patient. Cues required for increasing step length due to shuffling steps   Stairs             Wheelchair Mobility    Modified Rankin (Stroke Patients Only) Modified Rankin (Stroke Patients Only) Pre-Morbid Rankin Score: No symptoms Modified Rankin: Moderately severe disability     Balance Overall balance assessment: Needs assistance Sitting-balance support: Feet supported;No upper extremity supported Sitting balance-Leahy Scale: Poor Sitting balance - Comments: min guard for safety   Standing balance support: Single extremity supported;During functional activity;Bilateral upper extremity supported Standing balance-Leahy Scale: Poor Standing balance comment: +2 min assist with single UE support statically                            Cognition Arousal/Alertness: Awake/alert Behavior During Therapy: Flat affect Overall Cognitive Status: Impaired/Different from baseline Area of Impairment: Memory;Attention;Following commands;Safety/judgement;Awareness;Problem solving;Orientation                 Orientation Level:  (not asked) Current Attention Level: Sustained (short duration) Memory: Decreased short-term memory Following Commands: Follows one step commands inconsistently;Follows one step commands with increased time Safety/Judgement: Decreased awareness of safety;Decreased awareness of deficits Awareness: Intellectual Problem Solving: Slow processing;Decreased  initiation;Difficulty sequencing;Requires verbal cues;Requires tactile cues General Comments: Pt following one step commands with  increased time. Pt with poor attention, problem solving, and awareness. Pt requiring Max cues to engage L side and environement      Exercises Other Exercises Other Exercises: Pt performing writting task while standing with RUE. Having pt use dry erase marker to fill in circle. Noting that pt only filling left lower portion of circle. Min-Mod A for dynamic balance    General Comments General comments (skin integrity, edema, etc.): SpO2 with moments at 86% but quickly returning to 90s.      Pertinent Vitals/Pain Pain Assessment: Faces Faces Pain Scale: No hurt Pain Intervention(s): Monitored during session    Home Living                      Prior Function            PT Goals (current goals can now be found in the care plan section) Acute Rehab PT Goals Patient Stated Goal: none stated PT Goal Formulation: With patient/family Time For Goal Achievement: 02/09/21 Potential to Achieve Goals: Good Progress towards PT goals: Progressing toward goals    Frequency    Min 4X/week      PT Plan Current plan remains appropriate    Co-evaluation PT/OT/SLP Co-Evaluation/Treatment: Yes Reason for Co-Treatment: For patient/therapist safety;To address functional/ADL transfers PT goals addressed during session: Mobility/safety with mobility;Balance OT goals addressed during session: ADL's and self-care      AM-PAC PT "6 Clicks" Mobility   Outcome Measure  Help needed turning from your back to your side while in a flat bed without using bedrails?: Total Help needed moving from lying on your back to sitting on the side of a flat bed without using bedrails?: Total Help needed moving to and from a bed to a chair (including a wheelchair)?: Total Help needed standing up from a chair using your arms (e.g., wheelchair or bedside chair)?: Total Help  needed to walk in hospital room?: Total Help needed climbing 3-5 steps with a railing? : Total 6 Click Score: 6    End of Session Equipment Utilized During Treatment: Gait belt Activity Tolerance: Patient tolerated treatment well Patient left: in chair;with call bell/phone within reach;with chair alarm set Nurse Communication: Mobility status PT Visit Diagnosis: Unsteadiness on feet (R26.81);Other abnormalities of gait and mobility (R26.89);Muscle weakness (generalized) (M62.81);Difficulty in walking, not elsewhere classified (R26.2);Other symptoms and signs involving the nervous system (R29.898);Hemiplegia and hemiparesis Hemiplegia - Right/Left: Left Hemiplegia - caused by: Cerebral infarction     Time: 1761-6073 PT Time Calculation (min) (ACUTE ONLY): 33 min  Charges:  $Gait Training: 8-22 mins                     Eliza Grissinger A. Dan Humphreys PT, DPT Acute Rehabilitation Services Pager [phone] Office [phone]    Viviann Spare 02/01/2021, 4:34 PM

## 2021-02-01 NOTE — Progress Notes (Signed)
STROKE TEAM PROGRESS NOTE   INTERVAL HISTORY Temp 100.9 last pm. WBC 15 this morning. EEG negative.  Recommended to remain NPO by ST. Alertness slightly improved today but remains inattentive overall. Poor cough effort.   No visitors at bedside.    Vitals:   02/01/21 0315 02/01/21 0804 02/01/21 1142 02/01/21 1549  BP: (!) 147/78 (!) 167/74 115/65 (!) 149/65  Pulse: 84 93 74 88  Resp: 20 (!) 25 (!) 30 (!) 22  Temp: 99.3 F (37.4 C) 99.9 F (37.7 C) 98.9 F (37.2 C) 100 F (37.8 C)  TempSrc: Axillary Axillary Axillary Axillary  SpO2: 94% 91% 92% 93%  Weight:      Height:       CBC:  Recent Labs  Lab 01/30/21 0820 02/01/21 0350  WBC 12.2* 15.5*  NEUTROABS 9.5* 12.1*  HGB 11.6* 10.8*  HCT 35.7* 33.7*  MCV 93.2 93.4  PLT 295 366   Basic Metabolic Panel:  Recent Labs  Lab 01/27/21 0048 01/27/21 0624 01/28/21 0336 01/28/21 1316 01/30/21 0820 02/01/21 0350  NA 139   < > 142   < > 143 145  K 3.6  --  3.6   < > 4.0 3.6  CL 111  --  111   < > 110 113*  CO2 21*  --  23   < > 24 21*  GLUCOSE 147*  --  151*   < > 127* 134*  BUN 11  --  14   < > 21 25*  CREATININE 0.74  --  0.68   < > 0.80 0.72  CALCIUM 7.8*  --  8.0*   < > 8.4* 8.6*  MG 2.3  --  2.5*  --   --   --   PHOS 1.6*  --  3.4  --   --   --    < > = values in this interval not displayed.   Lipid Panel:  Recent Labs  Lab 01/26/21 0441  TRIG 91   HgbA1c:  No results for input(s): HGBA1C in the last 168 hours. Urine Drug Screen:  No results for input(s): LABOPIA, COCAINSCRNUR, LABBENZ, AMPHETMU, THCU, LABBARB in the last 168 hours.  Alcohol Level No results for input(s): ETH in the last 168 hours.  IMAGING   CT head Result Date: 01/27/2021 Impression Expected evolution of right MCA territory infarct with unchanged multifocal intraparenchymal and subarachnoid blood. No worsened midline shift.  MRA head/MRI brain Result Date: 01/26/2021 IMPRESSION: 1. Large acute infarct of the right MCA territory  involving the frontal operculum, insula and basal ganglia. No hemorrhage or mass effect. 2. Punctate foci of acute ischemia in the right temporal lobe and right thalamus. 3. Normal intracranial MRA.  DG CHEST PORT 1 VIEW Result Date: 01/25/2021 IMPRESSION:  1.  Endotracheal tube and NG tube in good anatomic position.  2.  Cardiomegaly.  No pulmonary venous congestion.  3.  Low lung volumes with bibasilar atelectasis.    CT CEREBRAL PERFUSION W CONTRAST Result Date: 01/24/2021 IMPRESSION:  1. 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  2. 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  3. 68 mL right MCA territory core infarct with 133 mL area of ischemic penumbra.   CT HEAD CODE STROKE WO CONTRAST Result Date: 01/24/2021 IMPRESSION: 1. 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 2. Hyperdense appearance of the right MCA. 3. ASPECTS  is 7.   CT ANGIO HEAD/NECK CODE STROKE Result Date: 01/24/2021 IMPRESSION:  1. 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  2. 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 3. 68 mL right MCA territory core infarct with 133 mL area of ischemic penumbra.  PHYSICAL EXAM Frail middle-age male not in distress HEENT-  [place]/AT Lungs - Respirations unlabored Extremities - Warm and well perfused  Neuro - Patient lethargic but opens eyes to voice.  He is a bit more responsive today. Spoke in full sentence. Oriented to first and last name, place only.Moderate to severe dysarthria, followingmostsimple commands. Able to name most objects.  Mild difficulty with repeating due to cooperation. Right gaze preference able to cross midline but unable to fully track to the left,blinking to visual threat bilaterally right> left. PERRL. Left facial  droop. Tongueprotrusion midline. RUE spontaneous (4+/5), RLE spontaneous (4/5), LUE 2-/5 LLE spontaneous 3/5, able to wiggle toes bilaterally. Sensation symmetrical bilaterally,  Right FTN intact. Left FTNslow, ataxia present, gait not tested.  ASSESSMENT/PLAN [name] is a 65 y.o. male with no significant PMH presenting with left hemiplegia, left facial droop, and dysarthria.  Was found to have an acute right MCA territory infarct s/p tPA and bilateral common carotid arteriograms followed by right MCA/ACA/ICA thrombus retrieval with stent angioplasty..   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 Per Dr. 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   Code Stroke Acute right MCA territory infarct ASPECTS 7   CTA head & neck: Complete occlusion of the R ICA and R MCA   CT perfusion 68 mL right MCA territory core infarct with 133 mL area of ischemic penumbra.  S/p IR with R MCA TICI3 and R carotid stenting.  MRI: large acute infarct of the right MCA territory involving the       frontal operculum, insula and basal ganglia with hemorrhagic conversion   MRA:  R MCA patent  Right carotid duplex: ICA stent appears patent  Repeat CT head (4/23): evolution of right MCA infarct. Overall unchanged   2D Echo: EF 60-65%, mild LVH  LDL 126  HgbA1c 5.8  UDS negative  VTE prophylaxis - SCDs  Diet: NPO, Tube feeding, swallow not progressing. Consider PEG placement   aspirin 81 mg daily prior to admission, now on aspirin 81 mg daily and Brilinta (ticagrelor) 90 mg bid.   [place] consider 30-day cardiac event monitor as outpatient to rule out A. Fib.  Therapy recommendations:  CIR but not up to 3 hours a day tolerance, rehab coordinator monitoring, [place] need SNF as back up plan   Disposition: pending  Carotid occlusion  CTA neck: Complete occlusion of the R ICA   S/p IR with R carotid stenting.  Per Dr.  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  Right carotid duplex: ICA stent appears patent  On aspirin and Brilinta DAPT.  Cerebral Edema ? Chronic hyponatremia  MRI: early subacute ischemic infarction in the right MCA territory. There is swelling resulting in minimal mass effect on the right lateral ventricle.  Na 130->135->140->139->141->143->145  Na goal 145-150  Repeat CT head 4/23: unchanged, no worsening midline shift  Hypertension  Home meds:  none . SBP goal now < 160 given hemorrhagic conversion . Long-term BP goal normotensive . Continue Norvasc and add labetalol.  Hyperlipidemia  Home meds:  none  LDL 126, goal < 70  Add Lipitor 80g daily  Continue statin at discharge  Dysphagia  Secondary to stroke  NPO  SLP on board, continuing to recommend NPO as ov 4/28 eval  corpak placed, on TF @ 50cc  Consider PEG placement, discussed with wife. She will discuss with son today. Plan to contact her 4/29 to further discuss.   Leukocytosis Fever ?Aspiration PNA  WBC 9.7-12.9-18.5->13.7->12.2->15.5  Tmax 100.9, ?due to aspiration, poor cough effort  will continue to monitor for s/s of infec tion  CXR 4/18 Multifocal PNA  4/28 Per discussion with pharmacy will initiate Unasyn   Sputum Cx Pending, Blood cultures Pending UA: Pending, Urine Culture Pending  Resp therapy consulted  PRN albuterol neb   Wife updated by phone on 4/28  Other Stroke Risk Factors  Advanced Age >/= 63   Other Active Problems  Hypokalemia: wnl today   Hospital day # 7 I have personally obtained history,examined this patient, reviewed notes, independently viewed imaging studies, participated in medical decision making and plan of care.ROS completed by me personally and pertinent positives fully documented  I have made any additions or clarifications directly to the above note. Agree with note above.  Patient has had elevated white count and chest x-ray is abnormal showing  infiltrates.  Plan to start Unasyn for aspiration pneumonia.  Discussed with pharmacy.  Greater than 50% time during this 25-minute visit was spent on counseling and coordination of care and discussion with care team.  Delia Heady, MD Medical Director [facility] Stroke Center Pager: [phone] 02/02/2021 1:24 PM  To contact Stroke Continuity provider, please refer to [email or web address]. After hours, contact General Neurology

## 2021-02-01 NOTE — Progress Notes (Signed)
Pharmacy Antibiotic Note  Leonard [name] is a 65 y.o. male admitted on 01/24/2021 with stroke.  Pharmacy has been consulted for Unasyn dosing for aspiration pneumonia.  Tmax 100.9, WBC 15.5, cultures ordered.  Plan: Unasyn 3 gm IV q8h. Follow renal function, culture data, clinical progress and antibiotic plans.  Height: 5\' 9"  (175.3 cm) Weight: 79.7 kg (175 lb 11.3 oz) IBW/kg (Calculated) : 70.7  Temp (24hrs), Avg:99.6 F (37.6 C), Min:98.8 F (37.1 C), Max:100.9 F (38.3 C)  Recent Labs  Lab 01/27/21 0048 01/28/21 0336 01/29/21 0106 01/29/21 1503 01/30/21 0820 02/01/21 0350  WBC 13.7* 13.2*  --  11.2* 12.2* 15.5*  CREATININE 0.74 0.68 0.79  --  0.80 0.72    Estimated Creatinine Clearance: 92.1 mL/min (by C-G formula based on SCr of 0.72 mg/dL).    No Known Allergies  Antimicrobials this admission:  Unasyn 4/28 >>  Dose adjustments this admission:  n/a  Microbiology results:  4/28 urine: sent  4/28 blood x 2:  4/28 sputum:  4/21 MRSA PCR: negative  4/20 COVID and flu: negative  Thank you for allowing pharmacy to be a part of this patient's care.  5/20, Dennie Fetters 02/01/2021 5:32 PM

## 2021-02-01 NOTE — Progress Notes (Signed)
Modified Barium Swallow Progress Note LATE ENTRY  Patient Details  Name: Leonard [name] MRN: [id number] Date of Birth: 27-Apr-1956  Date completed: 01/30/2021  Modified Barium Swallow completed.  Full report located under Chart Review in the Imaging Section.  Brief recommendations include the following:  Clinical Impression      Pt presents with a moderate oral and moderate to severe pharyngeal dysphagia with sensory and motor deficits of neurogenic etiology. Oral deficit impairments included reduced left facial, labial, and lingual strength and ROM with decreased sensation. This allowed for reduced left sided anterior spillage, reduced bolus cohesion, and oral residuals. Pharyngeal deficits c/b reduced timing and efficiency of laryngeal vestibule closure, incomplete epiglottic deflection, reduced pharyngeal constriction, decreased base of tongue retraction, diminished sensation, and decreased UES distention. This resulted in pre, during, and post swallow penetration of all consistencies trialed to the level of the true vocal cords (puree, nectar thick, and honey thick liquids). Pt with incosistent sensation of reduced airway protection events. Cues for cough and or throat clear aided in expelling penetrates. Moderate valleuclar and pyriform sinsus residuals noted with thicker POs. Pt very high aspiration risk for POs across a meal given deficits noted this date. Recommend continue short term alternative nutrition and NPO with ice chips following diligent oral care and intensive swallowing therapy. SLP to follow up.      Swallow Evaluation Recommendations         SLP Diet Recommendations NPO  Liquid Administration via --  Medication Administration Via alternative means  Compensations --  Postural Changes --                                   Ardyth Gal MA, CCC-SLP Acute Rehabilitation Services   02/01/2021,8:39 AM

## 2021-02-01 NOTE — Progress Notes (Signed)
IP rehab admissions - not ready for CIR at this time.  Please see progress note from Megan Salon from 01/31/21.  [phone]

## 2021-02-01 NOTE — Progress Notes (Addendum)
Occupational Therapy Treatment Patient Details Name: Leonard [name] MRN: [id number] DOB: 03-29-1956 Today's Date: 02/01/2021    History of present illness 65 y.o. male who presented 4/20 with acute onset L hemiplegia, L facial droop, dysarthria, and L hemineglect. tPA was administered. 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 S/p bil common carotid arteriograms and revascularization of occluded R MCA and R ACA prox and R ICA terminus. ETT 4/20-4/21. No significant PMH.   OT comments  Pt progressing towards established OT goals. Pt continues to present with decreased cognition, strength, left sided attention/awareness, and balance. Pt requiring increased cues for attention, problem solving, and awareness. Pt performing functional mobility with Min A +2 (~15 ft x2 with seated break). Pt performing witting task while standing to challenge cognition, dynamic balance, and vision. Noting pt only writing in left lower portion of instructed area. Continue to highly recommend dc to CIR and will continue to follow acutely as admitted.   Follow Up Recommendations  CIR;Supervision/Assistance - 24 hour    Equipment Recommendations  None recommended by OT    Recommendations for Other Services Rehab consult    Precautions / Restrictions Precautions Precautions: Fall Precaution Comments: L neglect; R wrist restraint       Mobility Bed Mobility Overal bed mobility: Needs Assistance Bed Mobility: Supine to Sit     Supine to sit: Mod assist;+2 for physical assistance;+2 for safety/equipment     General bed mobility comments: Bringing LLE towards EOB and then elevatrinig trunk.    Transfers Overall transfer level: Needs assistance Equipment used: 2 person hand held  assist Transfers: Sit to/from [employer] Sit to Stand: Min assist;+2 physical assistance;+2 safety/equipment         General transfer comment: Min A +2 for safety, balance, and power up.    Balance Overall balance assessment: Needs assistance Sitting-balance support: Feet supported;No upper extremity supported Sitting balance-Leahy Scale: Poor Sitting balance - Comments: min guard for safety   Standing balance support: Single extremity supported;During functional activity;Bilateral upper extremity supported Standing balance-Leahy Scale: Poor Standing balance comment: +2 min assist with single UE support                           ADL either performed or assessed with clinical judgement   ADL Overall ADL's : Needs assistance/impaired Eating/Feeding: Moderate assistance;Sitting Eating/Feeding Details (indicate cue type and reason): Mod A for incorporating LUE into task as pt scooped ice chips out of cup. Grooming: Supervision/safety;Set up;Bed level;Wash/dry face Grooming Details (indicate cue type and reason): Pt washing his face while in bed. Max cues for washing L side of face                 Toilet Transfer: Minimal assistance;+2 for safety/equipment;+2 for physical assistance;Ambulation (simulated to recliner) Toilet Transfer Details (indicate cue type and reason): Min A +2 for safety, balance, and power up.         Functional mobility during ADLs: Minimal assistance;+2 for physical assistance;+2 for safety/equipment;Cueing for sequencing;Cueing for safety General ADL Comments: Starting with grooming task to increase arousal. Pt then requesting ice chips. Pt performing functional mobility to door, performing standing activity, and then taking seated rest break before walking back to recliner.     Vision  Vision Assessment?: Yes   Perception     Praxis      Cognition Arousal/Alertness: Awake/alert Behavior During Therapy: Flat  affect Overall Cognitive Status: Impaired/Different from baseline Area of Impairment: Memory;Attention;Following commands;Safety/judgement;Awareness;Problem solving;Orientation                 Orientation Level:  (Not asked) Current Attention Level: Sustained (short duration) Memory: Decreased short-term memory Following Commands: Follows one step commands inconsistently;Follows one step commands with increased time Safety/Judgement: Decreased awareness of safety;Decreased awareness of deficits Awareness: Intellectual Problem Solving: Slow processing;Decreased initiation;Difficulty sequencing;Requires verbal cues;Requires tactile cues General Comments: Pt following one step commands with increased time. Pt with poor attention, problem solving, and awareness. Pt requiring Max cues to engage L side and environement        Exercises     Shoulder Instructions       General Comments SpO2 with moments at 86% but quickly returning to 90s.    Pertinent Vitals/ Pain       Pain Assessment: Faces Faces Pain Scale: No hurt Pain Intervention(s): Monitored during session;Limited activity within patient's tolerance;Repositioned  Home Living                                          Prior Functioning/Environment              Frequency  Min 2X/week        Progress Toward Goals  OT Goals(current goals can now be found in the care plan section)  Progress towards OT goals: Progressing toward goals  Acute Rehab OT Goals Patient Stated Goal: none stated OT Goal Formulation: With patient/family Time For Goal Achievement: 02/09/21 Potential to Achieve Goals: Good ADL Goals Pt Will Perform Eating: with supervision;sitting Pt Will Perform Grooming: with min assist;standing Pt Will Perform Upper Body Bathing: with min assist;sitting Pt Will Perform Lower Body Bathing: with min assist;sit to/from stand Pt Will Perform Upper Body Dressing: with min  assist;sitting Pt Will Perform Lower Body Dressing: with min assist;sit to/from stand Pt Will Transfer to Toilet: with min guard assist;ambulating;regular height toilet;bedside commode;grab bars Pt Will Perform Toileting - Clothing Manipulation and hygiene: with min guard assist;sit to/from stand Additional ADL Goal #1: Pt will locate needed ADL items on his Lt with min cues during ADLs Additional ADL Goal #2: Pt will consistently use Lt UE as a gross assist during ADLs  Plan Discharge plan remains appropriate;Frequency remains appropriate    Co-evaluation    PT/OT/SLP Co-Evaluation/Treatment: Yes Reason for Co-Treatment: To address functional/ADL transfers;For patient/therapist safety   OT goals addressed during session: ADL's and self-care      AM-PAC OT "6 Clicks" Daily Activity     Outcome Measure   Help from another person eating meals?: A Lot Help from another person taking care of personal grooming?: A Lot Help from another person toileting, which includes using toliet, bedpan, or urinal?: A Lot Help from another person bathing (including washing, rinsing, drying)?: A Lot Help from another person to put on and taking off regular upper body clothing?: A Lot Help from another person to put on and taking off regular lower body clothing?: A Lot 6 Click Score: 12    End of Session Equipment Utilized During Treatment: Gait belt  OT Visit Diagnosis: Unsteadiness on feet (R26.81);Cognitive communication deficit (R41.841) Symptoms and signs involving cognitive functions: Cerebral infarction   Activity Tolerance Patient tolerated  treatment well   Patient Left with call bell/phone within reach;with restraints reapplied;in chair;with chair alarm set   Nurse Communication Mobility status        Time: 1316-1350 OT Time Calculation (min): 34 min  Charges: OT General Charges $OT Visit: 1 Visit OT Treatments $Self Care/Home Management : 8-22 mins  Cosette Prindle MSOT,  OTR/L Acute Rehab Pager: [phone] Office: [phone]   Theodoro Grist Mitzy Naron 02/01/2021, 2:31 PM

## 2021-02-01 NOTE — Progress Notes (Signed)
Nutrition Follow-up  DOCUMENTATION CODES:   Not applicable  INTERVENTION:  Monitor for bowel movement as none documented since PTA(>1 week ago). Consider use of enema/suppository.  Continue TF via Cortrak: -Osmolite 1.5 at 50 ml/h (1200 ml per day) -Prosource TF 45 ml TID -168m free water Q6H  Provides 1920 kcal, 108 gm protein, 912 ml free water daily (19178mtotal free water w/ flushes and IVF at current rate)  If pt swallow function fails to improve, recommend placement of PEG  NUTRITION DIAGNOSIS:   Inadequate oral intake related to inability to eat as evidenced by NPO status. -- ongoing  GOAL:   Patient will meet greater than or equal to 90% of their needs -- met with TF  MONITOR:   TF tolerance  REASON FOR ASSESSMENT:   Consult Enteral/tube feeding initiation and management  ASSESSMENT:   Pt admitted with acute ischemic R MCA CVA s/p IV tPA and thrombectomy with stenting of R ICA and MCA.   4/22 pt pulled NGT; cortrak placed (gastric tip)  Pt slightly more alert today but remains overall inattentive. Rehab Coordinator monitoring for pt's readiness for CIR admission. MD noted pt [place] need SNF as a backup.   Pt underwent MBS, remains NPO. Continue current TF regimen via Cortrak -- Osmolite 1.5 @ 5064mr with 60m44mosource TF TID. Consider PEG placement if swallow ability does not progress soon.  Monitor for bowel movement as none documented since prior to admission, >1 week ago. Consider use of enema/suppository.  UOP: 2255ml79m hours  Medications: senokot-s IVF: NS @ 25ml/29mLabs reviewed.  CBGs [phone] Order:   Diet Order            Diet NPO time specified  Diet effective now                 EDUCATION NEEDS:   No education needs have been identified at this time  Skin:  Skin Assessment: Reviewed RN Assessment  Last BM:  PTA??  Height:   Ht Readings from Last 1 Encounters:  01/25/21 5' 9"  (1.753 m)    Weight:   Wt  Readings from Last 1 Encounters:  01/31/21 79.7 kg    Ideal Body Weight:  72.7 kg  BMI:  Body mass index is 25.95 kg/m.  Estimated Nutritional Needs:  Kcal:  1900-2100 Protein:  100-120 grams Fluid:  >1.9 L/day   AmandaLarkin InaRD, LDN RD pager number and weekend/on-call pager number located in [email or web address]

## 2021-02-02 DIAGNOSIS — D72829 Elevated white blood cell count, unspecified: Secondary | ICD-10-CM | POA: Diagnosis not present

## 2021-02-02 DIAGNOSIS — I69391 Dysphagia following cerebral infarction: Secondary | ICD-10-CM | POA: Diagnosis not present

## 2021-02-02 DIAGNOSIS — Y95 Nosocomial condition: Secondary | ICD-10-CM | POA: Diagnosis not present

## 2021-02-02 DIAGNOSIS — J189 Pneumonia, unspecified organism: Secondary | ICD-10-CM

## 2021-02-02 DIAGNOSIS — R0682 Tachypnea, not elsewhere classified: Secondary | ICD-10-CM | POA: Diagnosis not present

## 2021-02-02 LAB — BLOOD CULTURE ID PANEL (REFLEXED) - BCID2

## 2021-02-02 LAB — CBC
HCT: 35.2 % — ABNORMAL LOW (ref 39.0–52.0)
Hemoglobin: 11 g/dL — ABNORMAL LOW (ref 13.0–17.0)
MCH: 29.3 pg (ref 26.0–34.0)
MCHC: 31.3 g/dL (ref 30.0–36.0)
MCV: 93.6 fL (ref 80.0–100.0)
Platelets: 370 10*3/uL (ref 150–400)
RBC: 3.76 MIL/uL — ABNORMAL LOW (ref 4.22–5.81)
RDW: 12.6 % (ref 11.5–15.5)
WBC: 15 10*3/uL — ABNORMAL HIGH (ref 4.0–10.5)
nRBC: 0 % (ref 0.0–0.2)

## 2021-02-02 LAB — URINE CULTURE: Culture: NO GROWTH

## 2021-02-02 LAB — GLUCOSE, CAPILLARY
Glucose-Capillary: 150 mg/dL — ABNORMAL HIGH (ref 70–99)
Glucose-Capillary: 126 mg/dL — ABNORMAL HIGH (ref 70–99)
Glucose-Capillary: 158 mg/dL — ABNORMAL HIGH (ref 70–99)
Glucose-Capillary: 123 mg/dL — ABNORMAL HIGH (ref 70–99)
Glucose-Capillary: 127 mg/dL — ABNORMAL HIGH (ref 70–99)

## 2021-02-02 LAB — BASIC METABOLIC PANEL
Anion gap: 9 (ref 5–15)
BUN: 27 mg/dL — ABNORMAL HIGH (ref 8–23)
CO2: 23 mmol/L (ref 22–32)
Calcium: 8.6 mg/dL — ABNORMAL LOW (ref 8.9–10.3)
Chloride: 116 mmol/L — ABNORMAL HIGH (ref 98–111)
Creatinine, Ser: 0.84 mg/dL (ref 0.61–1.24)
GFR, Estimated: >60 mL/min (ref >60)
Glucose, Bld: 136 mg/dL — ABNORMAL HIGH (ref 70–99)
Potassium: 4 mmol/L (ref 3.5–5.1)
Sodium: 148 mmol/L — ABNORMAL HIGH (ref 135–145)

## 2021-02-02 MED ORDER — FREE WATER
150.0000 mL | Status: DC
  Administered 2021-02-03 (×5): 150 mL

## 2021-02-02 MED ORDER — ALBUTEROL SULFATE (2.5 MG/3ML) 0.083% IN NEBU
2.5000 mg | INHALATION_SOLUTION | Freq: Four times a day (QID) | RESPIRATORY_TRACT | Status: DC
  Administered 2021-02-02 – 2021-02-03 (×2): 2.5 mg via RESPIRATORY_TRACT
  Filled 2021-02-02: qty 3

## 2021-02-02 MED ORDER — SODIUM CHLORIDE 0.9 % IV SOLN
2.0000 g | Freq: Three times a day (TID) | INTRAVENOUS | Status: DC
  Administered 2021-02-02 – 2021-02-04 (×5): 2 g via INTRAVENOUS
  Filled 2021-02-02 (×5): qty 2

## 2021-02-02 MED ORDER — HALOPERIDOL LACTATE 5 MG/ML IJ SOLN
2.0000 mg | Freq: Once | INTRAMUSCULAR | Status: AC
  Administered 2021-02-02: 2 mg via INTRAVENOUS
  Filled 2021-02-02: qty 1

## 2021-02-02 NOTE — Progress Notes (Addendum)
STROKE TEAM PROGRESS NOTE   INTERVAL HISTORY No visitors at bedside. Patient on  2L [place] O2 with SATs > 93%. Patient slouched in bed, but having increased WOB. Respirations 42. Upon reassessment and repositioning resp:30, still with some WOB, but seems better than initial assessment. Patient remains NPO d/t WOB and speech unable to properly assess swallowing. Unasyn startes yesterday. Spoke to [name] and she nor son have decided regarding PEG. Discussed code status. At this time she wished for patient to remain Full code. Will have a more formal discussion at bedside with son present, ;later today.  Addendum: Hospitalist consulted for pneumonia and increased WOB.  Vitals:   02/02/21 0350 02/02/21 0700 02/02/21 0730 02/02/21 1100  BP: 135/84 (!) 163/81  (!) (P) 114/52  Pulse:  97  (P) 82  Resp: (!) 24 (!) 38 (!) 28 (!) (P) 40  Temp: 98.8 F (37.1 C) 99.7 F (37.6 C)  (P) 99.5 F (37.5 C)  TempSrc: Axillary Axillary  (P) Axillary  SpO2: 92% 93%  (P) 91%  Weight:      Height:       CBC:  Recent Labs  Lab 01/30/21 0820 02/01/21 0350 02/02/21 0108  WBC 12.2* 15.5* 15.0*  NEUTROABS 9.5* 12.1*  --   HGB 11.6* 10.8* 11.0*  HCT 35.7* 33.7* 35.2*  MCV 93.2 93.4 93.6  PLT 295 366 370   Basic Metabolic Panel:  Recent Labs  Lab 01/27/21 0048 01/27/21 0624 01/28/21 0336 01/28/21 1316 02/01/21 0350 02/02/21 0108  NA 139   < > 142   < > 145 148*  K 3.6  --  3.6   < > 3.6 4.0  CL 111  --  111   < > 113* 116*  CO2 21*  --  23   < > 21* 23  GLUCOSE 147*  --  151*   < > 134* 136*  BUN 11  --  14   < > 25* 27*  CREATININE 0.74  --  0.68   < > 0.72 0.84  CALCIUM 7.8*  --  8.0*   < > 8.6* 8.6*  MG 2.3  --  2.5*  --   --   --   PHOS 1.6*  --  3.4  --   --   --    < > = values in this interval not displayed.    IMAGING   CT head Result Date: 01/27/2021 Impression Expected evolution of right MCA territory infarct with unchanged multifocal intraparenchymal and subarachnoid  blood. No worsened midline shift.  MRA head/MRI brain Result Date: 01/26/2021 IMPRESSION: 1. 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 No hemorrhage or mass effect. 2. Punctate foci of acute ischemia in the right temporal lobe and right thalamus. 3. Normal intracranial MRA.  DG CHEST PORT 1 VIEW Result Date: 01/25/2021 IMPRESSION:  1.  Endotracheal tube and NG tube in good anatomic position.  2.  Cardiomegaly.  No pulmonary venous congestion.  3.  Low lung volumes with bibasilar atelectasis.    CT CEREBRAL PERFUSION W CONTRAST Result Date: 01/24/2021 IMPRESSION:  1. 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  2. 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  3. 68 mL right MCA territory core infarct with 133 mL area of ischemic penumbra.   CT HEAD CODE STROKE WO CONTRAST Result Date: 01/24/2021  IMPRESSION: 1. 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 2. Hyperdense appearance of the right MCA. 3. ASPECTS is 7.   CT ANGIO HEAD/NECK CODE STROKE Result Date: 01/24/2021 IMPRESSION:  1. 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  2. 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 3. 68 mL right MCA territory core infarct with 133 mL area of ischemic penumbra.  PHYSICAL EXAM Frail middle-age male not in distress HEENT-  [place]/AT Lungs - breathing is labored Extremities - Warm and well perfused  Neuro - Patient lethargic but opens eyes to voice.  He is a bit more responsive today. Spoke in full sentence. Oriented to first and last name, place only. Moderate to severe dysarthria, following most simple commands. Able to name most objects.  Mild difficulty with repeating due to cooperation. Right gaze  preference able to cross midline but unable to fully track to the left, blinking to visual threat bilaterally right> left. PERRL. Left facial droop. Tongue protrusion midline. RUE spontaneous (4+/5), RLE spontaneous (4/5), LUE 2-/5 LLE spontaneous 3/5, able to wiggle toes bilaterally. Sensation symmetrical bilaterally,  Right FTN intact. Left FTN slow, ataxia present, gait not tested.   ASSESSMENT/PLAN [name] is a 65 y.o. male with no significant PMH presenting with left hemiplegia, left facial droop, and dysarthria.  Was found to have an acute right MCA territory infarct s/p tPA and bilateral common carotid arteriograms followed by right MCA/ACA/ICA thrombus retrieval with stent angioplasty..   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 Per Dr. 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  Code Stroke Acute right MCA territory infarct ASPECTS 7  CTA head & neck: Complete occlusion of the R ICA and R MCA  CT perfusion 68 mL right MCA territory core infarct with 133 mL area of ischemic penumbra. S/p IR with R MCA TICI3 and R carotid stenting. MRI: large acute infarct of the right MCA territory involving the       frontal operculum, insula and basal ganglia with hemorrhagic conversion  MRA:  R MCA patent Right carotid duplex: ICA stent appears patent Repeat CT head (4/23): evolution of right MCA infarct. Overall unchanged  2D Echo: EF 60-65%, mild LVH LDL 126 HgbA1c 5.8 UDS negative VTE prophylaxis - SCDs Diet: NPO, Tube feeding, swallow not progressing. Consider PEG placement  aspirin 81 mg daily prior to admission, now on aspirin 81 mg daily and Brilinta (ticagrelor) 90 mg bid.  [name] consider 30-day cardiac event monitor as outpatient to rule out A. Fib. Therapy recommendations:  CIR but not up to 3 hours a day tolerance, rehab coordinator monitoring, [place] need SNF as back up plan  Disposition: pending  Carotid  occlusion CTA neck: Complete occlusion of the R ICA  S/p IR with R carotid stenting. Per Dr. 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 Right carotid duplex: ICA stent appears patent On aspirin and Brilinta DAPT.  Cerebral Edema ? Chronic hyponatremia MRI: early subacute ischemic infarction in the right MCA territory. There is swelling resulting in minimal mass effect on the right lateral ventricle. Na 130->135->140->139->141->143->145 Na goal 145-150 Repeat CT head 4/23: unchanged, no worsening midline shift  Hypertension Home meds:  none SBP goal now < 160 given hemorrhagic conversion Long-term BP goal normotensive Continue Norvasc and add labetalol.  Hyperlipidemia Home meds:  none LDL 126, goal < 70 Add Lipitor 80g daily Continue statin at discharge  Dysphagia Secondary to stroke NPO SLP on board, continuing to recommend NPO as ov 4/28 eval corpak placed, on TF @ 50cc Consider PEG placement, discussed with wife. She will discuss with son today. Plan to contact her 4/29 to further discuss.   Leukocytosis Fever ?Aspiration PNA WBC 9.7-12.9-18.5->13.7->12.2->15.5 Tmax 100.9, ?due to aspiration, poor cough effort will continue to monitor for s/s of infec tion CXR 4/18 Multifocal PNA 4/28 Per discussion with pharmacy will initiate Unasyn  Sputum Cx Pending, Blood cultures no growth < 24 hrs UA: unremarkable, Urine Culture no growth Resp therapy consulted PRN albuterol neb  Wife updated by phone on 4/28  Other Stroke Risk Factors Advanced Age >/= 32   Other Active Problems Hypokalemia: wnl today   Valentina Lucks, MSN, NP-C [facility] [phone]  I have personally obtained history,examined this patient, reviewed notes, independently viewed imaging studies, participated in medical decision making and plan of care.ROS completed by me personally and pertinent positives fully documented  I have made any additions or clarifications directly to the above  note. Agree with note above.    Delia Heady, MD Medical Director [facility] Pager: [phone] 02/02/2021 5:09 PM  To contact Stroke Continuity provider, please refer to [email or web address]. After hours, contact General Neurology

## 2021-02-02 NOTE — Consult Note (Addendum)
[facility] Medical Consultation  Leonard [name] QPY:[id number] DOB: August 31, 1956 DOA: 01/24/2021 PCP: Oneita Hurt, No   Requesting physician: Dr. Pearlean Brownie Date of consultation: 02/02/21 Reason for consultation: management of pnuemonia  Impression/Recommendations Active Problems:   Stroke (cerebrum) (HCC)   Middle cerebral artery embolism, right   Hypertension   Tachypnea   Leukocytosis   Acute blood loss anemia   Dysphagia, post-stroke    1. Hospital acquired pneumonia - abnormal CXR with progressive infiltrates RUL, leukocytosis, low grade fever. Patient at risk for aspiration after CVA. Sputum culture ordered Recommend:  Cefipime 2 g IV q8  Albuterol nebulizer tx's to help mobilize secretions  Continue pulmonary toilet with IS every hour while awake  Continue Oxygen to keep sats >90%  Nasal swab for MRSA  Discussed with family the possibility of respiratory fatigue and/or desaturation that [place] require face-mask oxygen and if that fails BiPAP. They voiced understanding and agreed should these steps be necessary.  [facility] in-patient team  will followup again tomorrow. Please contact me if I can be of assistance in the meanwhile. Thank you for this consultation.  Chief Complaint: cough, SOB, leukocytosis, abnormal CXR  HPI:  Patient admitted 01/24/21 for stroke of right MCA territory for which he received tPA., T1C13 and carotid stendting. He required tube feeding due to impaired swallow.   Review of Systems:  Patient unable to commuicate answers  History reviewed. No pertinent past medical history. Past Surgical History:  Procedure Laterality Date  . IR ANGIO INTRA EXTRACRAN SEL COM CAROTID INNOMINATE UNI L MOD SED  01/25/2021  . IR CT HEAD LTD  01/25/2021  . IR CT HEAD LTD  01/25/2021  . IR PERCUTANEOUS ART THROMBECTOMY/INFUSION INTRACRANIAL INC DIAG ANGIO  01/25/2021  . RADIOLOGY WITH ANESTHESIA N/A 01/24/2021   Procedure: IR WITH ANESTHESIA;  Surgeon: Julieanne Cotton, MD;   Location: [facility] OR;  Service: Radiology;  Laterality: N/A;   Social History:  has no history on file for tobacco use, alcohol use, and drug use.  Married, has one son. Worked as an Immunologist.  No Known Allergies History reviewed. No pertinent family history.  Prior to Admission medications   Medication Sig Start Date End Date Taking? Authorizing Provider  acetaminophen (TYLENOL) 500 MG tablet Take 1,000 mg by mouth every 6 (six) hours as needed for mild pain.   Yes [provider]  aspirin EC 81 MG tablet Take 81 mg by mouth daily. Swallow whole.   Yes [provider]   Physical Exam: Blood pressure (!) 154/82, pulse 84, temperature 99 F (37.2 C), temperature source Axillary, resp. rate (!) 22, height 5\' 9"  (1.753 m), weight 79.7 kg, SpO2 90 %. Vitals:   02/02/21 1500 02/02/21 1600  BP: (!) 154/82   Pulse: 84   Resp: (!) 38 (!) 22  Temp: 99 F (37.2 C)   SpO2: 90%      General:  Overweight man with garbled speech  Eyes: left eye is closed. Right with clear conjunctiva, round reactive pupil  ENT: dry mucus membranes. NG feeding tube left nostril  Neck: supple  Cardiovascular: regular heart rate, w/o mm  Respiratory: increased WOB with rapid rate,  using abdominal musculature, no neck retraction. Rales noted upper right back with wet rhonchi, expratory wheezing.  Abdomen: soft, BS +  Skin: no lesions visible  Musculoskeletal: no deformity  Psychiatric: awake, makig jokes  Neurologic: left facial droop, garbled speech.  Labs on Admission:  Basic Metabolic Panel: Recent Labs  Lab  01/27/21 0048 01/27/21 8828 01/28/21 0336 01/28/21 1316 01/29/21 0106 01/29/21 1222 01/30/21 0820 02/01/21 0350 02/02/21 0108  NA 139   < > 142   < > 141  143 144 143 145 148*  K 3.6  --  3.6  --  3.8  --  4.0 3.6 4.0  CL 111  --  111  --  112*  --  110 113* 116*  CO2 21*  --  23  --  23  --  24 21* 23  GLUCOSE 147*  --  151*  --  136*   --  127* 134* 136*  BUN 11  --  14  --  20  --  21 25* 27*  CREATININE 0.74  --  0.68  --  0.79  --  0.80 0.72 0.84  CALCIUM 7.8*  --  8.0*  --  8.3*  --  8.4* 8.6* 8.6*  MG 2.3  --  2.5*  --   --   --   --   --   --   PHOS 1.6*  --  3.4  --   --   --   --   --   --    < > = values in this interval not displayed.   Liver Function Tests: Recent Labs  Lab 01/30/21 0820  AST 43*  ALT 47*  ALKPHOS 78  BILITOT 0.8  PROT 6.4*  ALBUMIN 3.2*   No results for input(s): LIPASE, AMYLASE in the last 168 hours. No results for input(s): AMMONIA in the last 168 hours. CBC: Recent Labs  Lab 01/28/21 0336 01/29/21 1503 01/30/21 0820 02/01/21 0350 02/02/21 0108  WBC 13.2* 11.2* 12.2* 15.5* 15.0*  NEUTROABS  --  8.9* 9.5* 12.1*  --   HGB 10.5* 10.9* 11.6* 10.8* 11.0*  HCT 31.9* 33.0* 35.7* 33.7* 35.2*  MCV 91.4 92.2 93.2 93.4 93.6  PLT 206 268 295 366 370   Cardiac Enzymes: No results for input(s): CKTOTAL, CKMB, CKMBINDEX, TROPONINI in the last 168 hours. BNP: Invalid input(s): POCBNP CBG: Recent Labs  Lab 02/01/21 1940 02/01/21 2325 02/02/21 0337 02/02/21 1142 02/02/21 1559  GLUCAP 122* 145* 150* 126* 158*    Radiological Exams on Admission: DG CHEST PORT 1 VIEW  Result Date: 02/01/2021 CLINICAL DATA:  Fever. EXAM: PORTABLE CHEST 1 VIEW COMPARISON:  Chest x-ray 01/29/2021, 01/25/2021. FINDINGS: Feeding tube noted with tip below left hemidiaphragm. Cardiomegaly. Progressive bilateral pulmonary infiltrates/edema, particularly in the right upper lung. Multifocal pneumonia and or pulmonary edema could present in this fashion. No pleural effusion or pneumothorax. Degenerative change thoracic spine. IMPRESSION: Feeding tube noted with tip below left hemidiaphragm. 2.  Cardiomegaly. 3. Progressive bilateral pulmonary infiltrates/edema, particularly in the right upper lung. Multifocal pneumonia and or pulmonary edema could present this fashion. Electronically Signed   By: Maisie Fus   Register   On: 02/01/2021 15:19   EEG adult  Result Date: 01/31/2021 Charlsie Quest, MD     01/31/2021  6:52 PM Patient Name: Cosby Proby Cephas MRN: [id number] Epilepsy Attending: Charlsie Quest Referring Physician/Provider: Dr Delia Heady Date: 01/31/2021 Duration: 23.32 mins Patient history: 65yo M with R MCA infarct, with ams. EEG to evaluate for seizure Level of alertness: Awake AEDs during EEG study: None Technical aspects: This EEG study was done with scalp electrodes positioned according to the 10-20 International system of electrode placement. Electrical activity was acquired at a sampling rate of 500Hz  and reviewed with a high frequency filter of 70Hz  and a low  frequency filter of 1Hz . EEG data were recorded continuously and digitally stored. Description: No clear posterior dominant rhythm was seen. EEG showed continuous generalized polymorphic mixed frequencies with predominantly to 6-9hz  Hz theta activity admixed with 2-3hz  delta slowing. Additionally there is continuous 3-5hz  theta-delta slowing in right temporal region. Hyperventilation and photic stimulation were not performed.   ABNORMALITY - Continuous slow, generalized and maximal right temporal region IMPRESSION: This study is suggestive of cortical dysfunction arising from right temporal region, likely secondary to underlying structural abnormality as well as mild to moderate diffuse encephalopathy, nonspecific etiology. No seizures or epileptiform discharges were seen throughout the recording. Priyanka    EKG: Independently reviewed. NSR, RBBB  ECHO  01/25/21  EF 60-65% LVH, nl diastolic fxn  Time spent: 45 min  01/27/21 [facility] Pager [phone]  If 7PM-7AM, please contact night-coverage [email or web address] Password [facility] 02/02/2021, 6:30 PM

## 2021-02-02 NOTE — Progress Notes (Addendum)
PHARMACY - PHYSICIAN COMMUNICATION CRITICAL VALUE ALERT - BLOOD CULTURE IDENTIFICATION (BCID)  Leonard [name] is an 65 y.o. male who presented to [facility] on 01/24/2021 with a chief complaint of stroke  Assessment:  Blood culture (1 of 2), with staph epi  Name of physician (or Provider) Contacted: Tollie Eth (neuro on call)  Current antibiotics: Unasyn  Changes to prescribed antibiotics recommended:  Patient is on recommended antibiotics - No changes needed  Results for orders placed or performed during the hospital encounter of 01/24/21  Blood Culture ID Panel (Reflexed) (Collected: 02/01/2021  5:17 PM)  Result Value Ref Range   Enterococcus faecalis NOT DETECTED NOT DETECTED   Enterococcus Faecium NOT DETECTED NOT DETECTED   Listeria monocytogenes NOT DETECTED NOT DETECTED   Staphylococcus species DETECTED (A) NOT DETECTED   Staphylococcus aureus (BCID) NOT DETECTED NOT DETECTED   Staphylococcus epidermidis DETECTED (A) NOT DETECTED   Staphylococcus lugdunensis NOT DETECTED NOT DETECTED   Streptococcus species NOT DETECTED NOT DETECTED   Streptococcus agalactiae NOT DETECTED NOT DETECTED   Streptococcus pneumoniae NOT DETECTED NOT DETECTED   Streptococcus pyogenes NOT DETECTED NOT DETECTED   A.calcoaceticus-baumannii NOT DETECTED NOT DETECTED   Bacteroides fragilis NOT DETECTED NOT DETECTED   Enterobacterales NOT DETECTED NOT DETECTED   Enterobacter cloacae complex NOT DETECTED NOT DETECTED   Escherichia coli NOT DETECTED NOT DETECTED   Klebsiella aerogenes NOT DETECTED NOT DETECTED   Klebsiella oxytoca NOT DETECTED NOT DETECTED   Klebsiella pneumoniae NOT DETECTED NOT DETECTED   Proteus species NOT DETECTED NOT DETECTED   Salmonella species NOT DETECTED NOT DETECTED   Serratia marcescens NOT DETECTED NOT DETECTED   Haemophilus influenzae NOT DETECTED NOT DETECTED   Neisseria meningitidis NOT DETECTED NOT DETECTED   Pseudomonas aeruginosa NOT DETECTED NOT DETECTED    Stenotrophomonas maltophilia NOT DETECTED NOT DETECTED   Candida albicans NOT DETECTED NOT DETECTED   Candida auris NOT DETECTED NOT DETECTED   Candida glabrata NOT DETECTED NOT DETECTED   Candida krusei NOT DETECTED NOT DETECTED   Candida parapsilosis NOT DETECTED NOT DETECTED   Candida tropicalis NOT DETECTED NOT DETECTED   Cryptococcus neoformans/gattii NOT DETECTED NOT DETECTED   Methicillin resistance mecA/C DETECTED (A) NOT DETECTED    Tad Moore, BCPS, BCCP Clinical Pharmacist  02/02/2021 9:52 PM   [facility] pharmacy phone numbers are listed on [email or web address]

## 2021-02-02 NOTE — Progress Notes (Signed)
  Speech Language Pathology Treatment: Dysphagia  Patient Details Name: Leonard [name] MRN: [id number] DOB: 11/17/1955 Today's Date: 02/02/2021 Time: 1000-1020 SLP Time Calculation (min) (ACUTE ONLY): 20 min  Assessment / Plan / Recommendation Clinical Impression  Pt has experienced a decline in function since ST session on 01/31/21.  Today pt had significant work of breathing on SLP arrival, which RN had noted earlier this morning.  CXR 02/01/21 concerning for pneumonia, abx started.  SLP provided oral care prior to administration of POs.  Oral mucosa dry with some dried secretions/blood in oral cavity.  Pt minimal defensive to oral care.  Pt requesting water on arrival and orange juice later during session.  Pt was unable to masticate ice chip today. Ineffective mandibular excursion noted with head bobbing and shaking while attempting to transit bolus.  Swallow initiation was delayed and laryngeal elevation was seemingly incomplete on palpation.  Suspect pt is unable to trigger pharyngeal swallow reflex at this time.  Given pt's decline in status, swallow exercises were not attempted during this session.   Pt continued to request ice chips and a total of 4 were given.  Wet vocal quality and delayed cough was noted.  Spoke with neuro MD, who believes that abx will improve pt's respiratory status.  Hopefully pt will be able to participate in therapy at previous level early next week.  If there is a change in pt status over the weekend, please let SLP know (office [phone]; weekend pager [phone]).  SLP applied oral moisturizer to lips and tongue/hard palate before departs.    Recommend pt remain NPO with alternate means of nutrition, hydration, and medication at this time.  Please hold ice chips at present.  Moist oral swabs for comfort.      HPI HPI: Pt is a 65 y.o. male with no significant PMHx who presented via EMS as a code stroke for acute onset of left hemiplegia, left facial droop,  dysarthria and left hemineglect. NIHSS was 18 on admission and tPA was given. 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 Pt s/p  s/p bilateral common carotid arteriograms followed by right MCA/ACA/ICA thrombus retrieval with stent angioplasty 4/21. Pt was intubated for procedure. CT head 4/22: Large area of hypoattenuation at the site of known right MCA territory infarct. Small focus of parenchymal hyperdensity at the posterior aspect of the infarct site [place] indicate a small amount of petechial hemorrhage or contrast extravasation from procedure. Small amount of subarachnoid hyperdensity over the right frontal convexity, either subarachnoid blood or contrast taining.  CXR 4/28 concerning for possible pneumonia.      SLP Plan  Continue with current plan of care       Recommendations  Diet recommendations: NPO Medication Administration: Via alternative means                Oral Care Recommendations: Oral care QID Follow up Recommendations: Inpatient Rehab SLP Visit Diagnosis: Dysphagia, oropharyngeal phase (R13.12) Plan: Continue with current plan of care       GO                Kerrie Pleasure, MA, CCC-SLP Acute Rehabilitation Services Office: [phone] 02/02/2021, 10:39 AM

## 2021-02-02 NOTE — Progress Notes (Signed)
Inpatient Rehab Admissions Coordinator:  Pt not medically ready for CIR.  Will continue to follow pt's tolerance and progress with therapies as well as medical workup.   Rylynn Schoneman Graves Madden, MS, CCC-SLP Admissions Coordinator [phone]  

## 2021-02-02 NOTE — Progress Notes (Incomplete)
   halddol 2 mg

## 2021-02-03 ENCOUNTER — Inpatient Hospital Stay (HOSPITAL_COMMUNITY): Payer: Medicare HMO

## 2021-02-03 DIAGNOSIS — J9601 Acute respiratory failure with hypoxia: Secondary | ICD-10-CM | POA: Diagnosis not present

## 2021-02-03 DIAGNOSIS — I639 Cerebral infarction, unspecified: Secondary | ICD-10-CM | POA: Diagnosis not present

## 2021-02-03 DIAGNOSIS — J1282 Pneumonia due to coronavirus disease 2019: Secondary | ICD-10-CM

## 2021-02-03 DIAGNOSIS — U071 COVID-19: Secondary | ICD-10-CM | POA: Diagnosis not present

## 2021-02-03 DIAGNOSIS — I10 Essential (primary) hypertension: Secondary | ICD-10-CM | POA: Diagnosis not present

## 2021-02-03 DIAGNOSIS — R0682 Tachypnea, not elsewhere classified: Secondary | ICD-10-CM | POA: Diagnosis not present

## 2021-02-03 DIAGNOSIS — I69391 Dysphagia following cerebral infarction: Secondary | ICD-10-CM | POA: Diagnosis not present

## 2021-02-03 DIAGNOSIS — I6601 Occlusion and stenosis of right middle cerebral artery: Secondary | ICD-10-CM | POA: Diagnosis not present

## 2021-02-03 LAB — BASIC METABOLIC PANEL
Anion gap: 11 (ref 5–15)
BUN: 32 mg/dL — ABNORMAL HIGH (ref 8–23)
CO2: 21 mmol/L — ABNORMAL LOW (ref 22–32)
Calcium: 8.3 mg/dL — ABNORMAL LOW (ref 8.9–10.3)
Chloride: 120 mmol/L — ABNORMAL HIGH (ref 98–111)
Creatinine, Ser: 0.95 mg/dL (ref 0.61–1.24)
GFR, Estimated: >60 mL/min (ref >60)
Glucose, Bld: 177 mg/dL — ABNORMAL HIGH (ref 70–99)
Potassium: 3.7 mmol/L (ref 3.5–5.1)
Sodium: 152 mmol/L — ABNORMAL HIGH (ref 135–145)

## 2021-02-03 LAB — CBC
HCT: 30.7 % — ABNORMAL LOW (ref 39.0–52.0)
Hemoglobin: 9.7 g/dL — ABNORMAL LOW (ref 13.0–17.0)
MCH: 29.7 pg (ref 26.0–34.0)
MCHC: 31.6 g/dL (ref 30.0–36.0)
MCV: 93.9 fL (ref 80.0–100.0)
Platelets: 369 10*3/uL (ref 150–400)
RBC: 3.27 MIL/uL — ABNORMAL LOW (ref 4.22–5.81)
RDW: 12.6 % (ref 11.5–15.5)
WBC: 13 10*3/uL — ABNORMAL HIGH (ref 4.0–10.5)
nRBC: 0 % (ref 0.0–0.2)

## 2021-02-03 LAB — GLUCOSE, CAPILLARY
Glucose-Capillary: 149 mg/dL — ABNORMAL HIGH (ref 70–99)
Glucose-Capillary: 105 mg/dL — ABNORMAL HIGH (ref 70–99)
Glucose-Capillary: 142 mg/dL — ABNORMAL HIGH (ref 70–99)
Glucose-Capillary: 156 mg/dL — ABNORMAL HIGH (ref 70–99)
Glucose-Capillary: 132 mg/dL — ABNORMAL HIGH (ref 70–99)

## 2021-02-03 LAB — MRSA PCR SCREENING: MRSA by PCR: NEGATIVE

## 2021-02-03 LAB — RESP PANEL BY RT-PCR (FLU A&B, COVID) ARPGX2
Influenza A by PCR: NEGATIVE
Influenza B by PCR: NEGATIVE
SARS Coronavirus 2 by RT PCR: POSITIVE — AB

## 2021-02-03 LAB — C-REACTIVE PROTEIN: CRP: 19.3 mg/dL — ABNORMAL HIGH (ref <1.0)

## 2021-02-03 LAB — BRAIN NATRIURETIC PEPTIDE: B Natriuretic Peptide: 109.3 pg/mL — ABNORMAL HIGH (ref 0.0–100.0)

## 2021-02-03 LAB — PROCALCITONIN: Procalcitonin: 0.11 ng/mL

## 2021-02-03 LAB — D-DIMER, QUANTITATIVE: D-Dimer, Quant: 3.91 ug/mL-FEU — ABNORMAL HIGH (ref 0.00–0.50)

## 2021-02-03 MED ORDER — METHYLPREDNISOLONE SODIUM SUCC 40 MG IJ SOLR
0.5000 mg/kg | Freq: Two times a day (BID) | INTRAMUSCULAR | Status: DC
  Administered 2021-02-03 – 2021-02-05 (×4): 39.6 mg via INTRAVENOUS
  Filled 2021-02-03 (×4): qty 1

## 2021-02-03 MED ORDER — LORAZEPAM 2 MG/ML IJ SOLN: 0.7500 mg | Freq: Once | INTRAMUSCULAR | Status: DC

## 2021-02-03 MED ORDER — HALOPERIDOL LACTATE 5 MG/ML IJ SOLN
2.0000 mg | Freq: Once | INTRAMUSCULAR | Status: AC
  Administered 2021-02-03: 2 mg via INTRAVENOUS
  Filled 2021-02-03: qty 1

## 2021-02-03 MED ORDER — TOCILIZUMAB 400 MG/20ML IV SOLN
8.0000 mg/kg | Freq: Once | INTRAVENOUS | Status: AC
  Administered 2021-02-03: 632 mg via INTRAVENOUS
  Filled 2021-02-03: qty 31.6

## 2021-02-03 MED ORDER — SODIUM CHLORIDE 0.9 % IV SOLN
100.0000 mg | Freq: Every day | INTRAVENOUS | Status: AC
  Administered 2021-02-04 – 2021-02-07 (×4): 100 mg via INTRAVENOUS
  Filled 2021-02-03 (×4): qty 20

## 2021-02-03 MED ORDER — ZINC SULFATE 220 (50 ZN) MG PO CAPS
220.0000 mg | ORAL_CAPSULE | Freq: Every day | ORAL | Status: DC
  Administered 2021-02-03 – 2021-02-11 (×7): 220 mg via ORAL
  Filled 2021-02-03 (×9): qty 1

## 2021-02-03 MED ORDER — PREDNISONE 50 MG PO TABS: 50.0000 mg | ORAL_TABLET | Freq: Every day | ORAL | Status: DC

## 2021-02-03 MED ORDER — FREE WATER
200.0000 mL | Status: DC
  Administered 2021-02-03 – 2021-02-04 (×4): 200 mL

## 2021-02-03 MED ORDER — ASCORBIC ACID 500 MG PO TABS
500.0000 mg | ORAL_TABLET | Freq: Every day | ORAL | Status: DC
  Administered 2021-02-03 – 2021-02-05 (×3): 500 mg via ORAL
  Filled 2021-02-03 (×3): qty 1

## 2021-02-03 MED ORDER — SODIUM CHLORIDE 0.9 % IV SOLN
200.0000 mg | Freq: Once | INTRAVENOUS | Status: AC
  Administered 2021-02-03: 200 mg via INTRAVENOUS
  Filled 2021-02-03: qty 40

## 2021-02-03 MED ORDER — ALBUTEROL SULFATE HFA 108 (90 BASE) MCG/ACT IN AERS
2.0000 | INHALATION_SPRAY | Freq: Four times a day (QID) | RESPIRATORY_TRACT | Status: DC
  Administered 2021-02-03 – 2021-02-04 (×6): 2 via RESPIRATORY_TRACT
  Filled 2021-02-03 (×2): qty 6.7

## 2021-02-03 NOTE — Plan of Care (Signed)
  Problem: Health Behavior/Discharge Planning: Goal: Ability to manage health-related needs will improve Outcome: Progressing   Problem: Education: Goal: Knowledge of patient specific risk factors addressed and post discharge goals established will improve Outcome: Progressing   Problem: Education: Goal: Knowledge of secondary prevention will improve Outcome: Progressing   Problem: Education: Goal: Knowledge of disease or condition will improve Outcome: Progressing

## 2021-02-03 NOTE — Progress Notes (Signed)
STROKE TEAM PROGRESS NOTE   INTERVAL HISTORY No visitors at bedside. Pt lying in bed, on [place]. Still has SOB and increased WOB. Dr. Wyonia Hough found pt COVID positive. CXR showed worsening multifocal infection and/or edema. Now on cefepime and remdesivir.   Vitals:   02/03/21 0200 02/03/21 0340 02/03/21 0500 02/03/21 0833  BP: (!) 104/49 (!) 116/51  130/62  Pulse: 92 70    Resp: (!) 35 20    Temp: 97.8 F (36.6 C) 98.8 F (37.1 C)  98.2 F (36.8 C)  TempSrc: Axillary Axillary    SpO2: 100% 100%    Weight:   79 kg   Height:       CBC:  Recent Labs  Lab 01/30/21 0820 02/01/21 0350 02/02/21 0108 02/03/21 0259  WBC 12.2* 15.5* 15.0* 13.0*  NEUTROABS 9.5* 12.1*  --   --   HGB 11.6* 10.8* 11.0* 9.7*  HCT 35.7* 33.7* 35.2* 30.7*  MCV 93.2 93.4 93.6 93.9  PLT 295 366 370 369   Basic Metabolic Panel:  Recent Labs  Lab 01/28/21 0336 01/28/21 1316 02/02/21 0108 02/03/21 0259  NA 142   < > 148* 152*  K 3.6   < > 4.0 3.7  CL 111   < > 116* 120*  CO2 23   < > 23 21*  GLUCOSE 151*   < > 136* 177*  BUN 14   < > 27* 32*  CREATININE 0.68   < > 0.84 0.95  CALCIUM 8.0*   < > 8.6* 8.3*  MG 2.5*  --   --   --   PHOS 3.4  --   --   --    < > = values in this interval not displayed.    IMAGING   CT head Result Date: 01/27/2021 Impression Expected evolution of right MCA territory infarct with unchanged  multifocal intraparenchymal and subarachnoid blood. No worsened midline shift.  MRA head/MRI brain Result Date: 01/26/2021 IMPRESSION: 1. 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 No hemorrhage or mass effect. 2. Punctate foci of acute ischemia in the right temporal lobe and right thalamus. 3. Normal intracranial MRA.  DG CHEST PORT 1 VIEW Result Date: 01/25/2021 IMPRESSION:  1.  Endotracheal tube and NG tube in good anatomic position.  2.  Cardiomegaly.  No pulmonary venous congestion.  3.  Low lung volumes  with bibasilar atelectasis.   DG CHEST PORT 1 VIEW 11/05/2020 Increasing bilateral opacities, particularly in the right upper lobe and throughout the left hemithorax. Could reflect worsening multifocal infection and/or edema. Stable cardiomegaly. Feeding tube tip near the gastric antrum/duodenal bulb.   CT CEREBRAL PERFUSION W CONTRAST Result Date: 01/24/2021 IMPRESSION:  1. 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  2. 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  3. 68 mL right MCA territory core infarct with 133 mL area of ischemic penumbra.   CT HEAD CODE STROKE WO CONTRAST Result Date: 01/24/2021 IMPRESSION: 1. 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 2. Hyperdense appearance of the right MCA. 3. ASPECTS is 7.   CT ANGIO HEAD/NECK CODE STROKE Result Date: 01/24/2021 IMPRESSION:  1. 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  2. 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 3. 68  mL right MCA territory core infarct with 133 mL area of ischemic penumbra.  PHYSICAL EXAM  Temp:  [97.8 F (36.6 C)-98.8 F (37.1 C)] 98.2 F (36.8 C) (04/30 1153) Pulse Rate:  [70-93] 70 (04/30 0340) Resp:  [20-42] 20 (04/30 0340) BP: (104-157)/(49-72) 157/72 (04/30 1153) SpO2:  [94 %-100 %] 100 % (04/30 0340) Weight:  [79 kg] 79 kg (04/30 0500)  General - Well nourished, well developed, in acute respiratory distress and increased WOB.  Ophthalmologic - fundi not visualized due to noncooperation.  Cardiovascular - Regular rhythm and rate.  Neuro - awake, alert, eyes open, severe dysarthric with intangible speech. No spontaneous speech, but following most simple commands. Not able to name but seems able to repeat 3-word sentences in severe dysarthric voice. Right  gaze preference and left gaze incomplete, tracking bilaterally, visual field blinking to visual threat on the right but not on the left, PERRL. Left facial droop. Tongue protrusion not cooperative. LUE 2-/5 proximal but 0/5 distal. RUE at least 4/5. BLE at least 4/5. Sensation, coordination not cooperative and gait not tested.   ASSESSMENT/PLAN [name] is a 65 y.o. male with no significant PMH presenting with left hemiplegia, left facial droop, and dysarthria.  Was found to have an acute right MCA territory infarct s/p tPA and bilateral common carotid arteriograms followed by right MCA/ACA/ICA thrombus retrieval with stent angioplasty..   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 Per Dr. 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   Code Stroke Acute right MCA territory infarct ASPECTS 7   CTA head & neck: Complete occlusion of the R ICA and R MCA   CT perfusion 68 mL right MCA territory core infarct with 133 mL area of ischemic penumbra.  S/p IR with R MCA TICI3 and R carotid stenting.  MRI: large acute infarct of the right MCA territory involving the frontal operculum, insula and basal ganglia with hemorrhagic conversion   MRA R MCA patent  Right carotid duplex: ICA stent appears patent  Repeat CT head (4/23): evolution of right MCA infarct. Overall unchanged   2D Echo: EF 60-65%, mild LVH  LDL 126  HgbA1c 5.8  UDS negative  VTE prophylaxis - SCDs  Diet: NPO, Tube feeding, swallow not progressing. Consider PEG placement   aspirin 81 mg daily prior to admission, now on aspirin 81 mg daily and Brilinta (ticagrelor) 90 mg bid.   [place] consider 30-day cardiac event monitor as outpatient to rule out A. Fib.  Therapy recommendations:  CIR but not up to 3 hours a day tolerance, rehab coordinator monitoring, [place] need SNF as back up plan   Disposition: pending  Carotid occlusion  CTA neck: Complete  occlusion of the R ICA   S/p IR with R carotid stenting.  Per Dr. 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  Right carotid duplex: ICA stent appears patent  On aspirin and Brilinta DAPT.  COVID Fever Respiratory distress  Pt is unvaccinated for COVID  SARS - negative on 01/24/21  SARS - positive on 02/03/21  CXR worsening multifocal pneumonia  Transfer pt to hospitalist service  On [place]  On remidesivir now  On cefepime  Hypernatremia   Na 143->145->148->152  Likely due to fever and respiratory water loss  On TF and FW  Increase FW to 200 Q4  Hypertension  Home meds:  none . SBP goal now < 160 given hemorrhagic conversion . Long-term  BP goal normotensive . Continue Norvasc and add labetalol.  Hyperlipidemia  Home meds:  none  LDL 126, goal < 70  Add Lipitor 80g daily  Continue statin at discharge  Dysphagia  Secondary to stroke  NPO  SLP on board  [place] placed on TF @ 50cc  On FW  Other Stroke Risk Factors  Advanced Age >/= 57    Other Active Problems  Code status - currently full code  Hypokalemia: -> now 3.7  Patient condition worsened within the last 24 hours, has developed COVID positive, increased WOB, continues to have pneumonia and worsening on CXR. I discussed with Dr. Elvera Lennox. I spent  35 minutes in total face-to-face time with the patient, more than 50% of which was spent in counseling and coordination of care, reviewing test results, images and medication, and discussing the diagnosis, treatment plan and potential prognosis. This patient's care requiresreview of multiple databases, neurological assessment, discussion with family, other specialists and medical decision making of high complexity.   Marvel Plan, MD PhD Stroke Neurology 02/03/2021 7:32 PM      To contact Stroke Continuity provider, please refer to [email or web address]. After hours, contact General Neurology

## 2021-02-03 NOTE — Progress Notes (Signed)
Called for patient being tachypneic but no other abnormal vitals signs and suggested to Primary RN to notify MD and request chest xray and ABG as this RN had to go to a critical call prior to seeing patient. Primary RN verbalized understanding.

## 2021-02-03 NOTE — Progress Notes (Signed)
[facility] night shift.  The staff reported that the patient was hyperventilating in the 30s.  The rest of the vital signs were otherwise stable.  At this time, he has bilateral rhonchi, but no wheezing on exanimation.  He was admitted for acute CVA due to right MCA embolism.  He was started on IV antibiotics for hospital-acquired pneumonia likely due to aspiration.  Haloperidol 2 mg x 2 doses given earlier have helped him relaxed and he is sleeping comfortably at this time.  Sanda Klein, MD.

## 2021-02-03 NOTE — Progress Notes (Incomplete)
Per off going had off report ,pt has been tac hypnic all day of which md was aware and nothing ordered. m Pt agitated trying to get oob and pulling on tubes with rt hand mitten on Co call MD for [facility] called  Order for rt wrist non violence soft restrain and an order to calm pt down . Haldol 2 mg giver pt reposition by Nursing staff  With little help mouth care done. Will contimue to monitor

## 2021-02-03 NOTE — Progress Notes (Signed)
Pt continues to be tachypnic.  Rrapid response nurse called but was in code so MD on call notified another 2 mg dose of haldol given and  Helped C-XRay done. Pt resting quiet with intermittent restlessness and agitation. l now  Bladder scan show 202 ml at 0620 after 450 ml  of clear urine emptied.

## 2021-02-03 NOTE — Progress Notes (Addendum)
PROGRESS NOTE  Leonard [name] ZOX:[id number] DOB: 05-29-1956 DOA: 01/24/2021 PCP: Pcp, No   LOS: 9 days   Brief Narrative / Interim history: 65 year old male with no significant history came into the hospital with left-sided weakness, left facial droop and dysarthria.  He was found to have an acute right MCA territory infarct status post tPA and bilateral common carotid arteriograms followed by right MCA/ACA/ICA thrombus retrieval with stent angioplasty.  He was admitted in the neuro ICU initially.  Hospitalist service was consulted on 4/29 due to increased work of breathing  Subjective / 24h Interval events: Lethargic when I entered the room, visibly tachypneic.  Briefly talks when prompted but unable to carry a conversation.  Appears confused  Assessment & Plan: Principal Problem Acute hypoxic respiratory failure due to Covid 19 pneumonia, possible aspiration pneumonia with sepsis physiology -patient has been having increased work of breathing for the last few days per wife but yesterday much more so and hospitalist service was consulted.  He was placed on antibiotics due to concern for aspiration given poor mental status. -Chest x-ray with developing multifocal pneumonia.  COVID was sent this morning and returned positive -Patient significantly tachycardic this morning, hypoxic, confirmed full code with wife.  Start remdesivir, steroids, will give Actemra. He is expected to survive >48 hours and has good baseline functional status prior to his CVA.  He is not known to be on immunomodulators, anti-rejection medications, or cancer chemotherapy, has no history of TB or latent TB, and no history of diverticulitis or intestinal perforation.  Platelets are >50K, ANC is >500, and ALT/AST are below 5x ULN with no known hepatitis B infection. The investigational nature of this medication was discussed with the wife and she agrees to the use of this medication. -Check procalcitonin, D-dimer, CRP, BNP -He  unfortunately is unvaccinated  COVID-19 Labs  No results for input(s): DDIMER, FERRITIN, LDH, CRP in the last 72 hours.  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  His MRI on admission showed large acute infarct of the right MCA territory involving the frontal operculum, insula and basal ganglia with hemorrhagic conversion.  A 2D echo showed EF 60-65%, mild LVH.  LDL is 126.  A1c is 5.8.  He is currently on aspirin and Brilinta.  Has a core track for feeding and family agrees to PEG but will need to get over Covid first.  Cerebral edema -goal sodium 145-150.  Slightly hypernatremic today.  Defer to neurology.   Essential hypertension-continue amlodipine, labetalol  Hyperlipidemia-continue statin  Dysphagia-due to stroke, has a core track, continue tube feeds.  Family wants PEG  Scheduled Meds: . albuterol  2 puff Inhalation QID  . amLODipine  10 mg Per Tube Daily  . aspirin  81 mg Oral Daily   Or  . aspirin  81 mg Per Tube Daily  . atorvastatin  80 mg Per Tube Daily  . chlorhexidine  15 mL Mouth Rinse BID  . Chlorhexidine Gluconate Cloth  6 each Topical Daily  . feeding supplement (PROSource TF)  45 mL Per Tube TID  . free water  150 mL Per Tube Q4H  . labetalol  200 mg Per Tube BID  . mouth rinse  15 mL Mouth Rinse q12n4p  . senna-docusate  1 tablet Per Tube Daily  . ticagrelor  90  mg Oral BID   Or  . ticagrelor  90 mg Per Tube BID   Continuous Infusions: . ceFEPime (MAXIPIME) IV 2 g (02/03/21 0507)  . feeding supplement (OSMOLITE 1.5 CAL) 1,000 mL (02/03/21 0222)   PRN Meds:.acetaminophen **OR** acetaminophen (TYLENOL) oral liquid 160 mg/5 mL **OR** acetaminophen, albuterol, labetalol  Diet Orders (From admission, onward)    Start     Ordered   01/25/21 0300  Diet NPO  time specified  Diet effective now        01/25/21 0259          DVT prophylaxis: SCD's Start: 01/25/21 0006     Code Status: Full Code  Family Communication: Discussed with wife over the phone  Status is: Inpatient  Remains inpatient appropriate because:Inpatient level of care appropriate due to severity of illness   Dispo: The patient is from: Home              Anticipated d/c is to: CIR              Patient currently is not medically stable to d/c.   Difficult to place patient No   Level of care: Progressive  Consultants:  Neurology   Procedures:  2D echo  Microbiology  1/4 bottles-staph epi blood cultures 4/28 SARS-CoV-2 + on 4/30  Antimicrobials: Cefepime 4/30 >> Remdesivir 4/30 >>  Objective: Vitals:   02/03/21 0200 02/03/21 0340 02/03/21 0500 02/03/21 0833  BP: (!) 104/49 (!) 116/51  130/62  Pulse: 92 70    Resp: (!) 35 20    Temp: 97.8 F (36.6 C) 98.8 F (37.1 C)  98.2 F (36.8 C)  TempSrc: Axillary Axillary    SpO2: 100% 100%    Weight:   79 kg   Height:        Intake/Output Summary (Last 24 hours) at 02/03/2021 1044 Last data filed at 02/03/2021 0600 Gross per 24 hour  Intake 747.5 ml  Output 1500 ml  Net -752.5 ml   Filed Weights   01/30/21 0532 01/31/21 0400 02/03/21 0500  Weight: 79.7 kg 79.7 kg 79 kg    Examination:  Constitutional: Appears lethargic Eyes: no scleral icterus ENMT: Mucous membranes are moist.  Neck: normal, supple Respiratory: Diffuse bilateral rhonchi, increased work of breathing, significant tachypnea Cardiovascular: Regular rate and rhythm, no murmurs / rubs / gallops. No LE edema. Good peripheral pulses Abdomen: non distended, no tenderness. Bowel sounds positive.  Musculoskeletal: no clubbing / cyanosis.  Skin: no rashes Neurologic: Could not assess due to lethargy   Data Reviewed: I have independently reviewed following labs and imaging studies   CBC: Recent Labs  Lab 01/29/21 1503  01/30/21 0820 02/01/21 0350 02/02/21 0108 02/03/21 0259  WBC 11.2* 12.2* 15.5* 15.0* 13.0*  NEUTROABS 8.9* 9.5* 12.1*  --   --   HGB 10.9* 11.6* 10.8* 11.0* 9.7*  HCT 33.0* 35.7* 33.7* 35.2* 30.7*  MCV 92.2 93.2 93.4 93.6 93.9  PLT 268 295 366 370 369   Basic Metabolic Panel: Recent Labs  Lab 01/28/21 0336 01/28/21 1316 01/29/21 0106 01/29/21 1222 01/30/21 0820 02/01/21 0350 02/02/21 0108 02/03/21 0259  NA 142   < > 141  143 144 143 145 148* 152*  K 3.6  --  3.8  --  4.0 3.6 4.0 3.7  CL 111  --  112*  --  110 113* 116* 120*  CO2 23  --  23  --  24 21* 23 21*  GLUCOSE 151*  --  136*  --  127* 134* 136* 177*  BUN 14  --  20  --  21 25* 27* 32*  CREATININE 0.68  --  0.79  --  0.80 0.72 0.84 0.95  CALCIUM 8.0*  --  8.3*  --  8.4* 8.6* 8.6* 8.3*  MG 2.5*  --   --   --   --   --   --   --   PHOS 3.4  --   --   --   --   --   --   --    < > = values in this interval not displayed.   Liver Function Tests: Recent Labs  Lab 01/30/21 0820  AST 43*  ALT 47*  ALKPHOS 78  BILITOT 0.8  PROT 6.4*  ALBUMIN 3.2*   Coagulation Profile: No results for input(s): INR, PROTIME in the last 168 hours. HbA1C: No results for input(s): HGBA1C in the last 72 hours. CBG: Recent Labs  Lab 02/02/21 1559 02/02/21 2012 02/02/21 2313 02/03/21 0342 02/03/21 0845  GLUCAP 158* 123* 127* 149* 105*    Recent Results (from the past 240 hour(s))  MRSA PCR Screening     Status: None   Collection Time: 01/25/21  3:25 AM   Specimen: Nasopharyngeal  Result Value Ref Range Status   MRSA by PCR NEGATIVE NEGATIVE Final    Comment:        The GeneXpert MRSA Assay (FDA approved for NASAL specimens only), is one component of a comprehensive MRSA colonization surveillance program. It is not intended to diagnose MRSA infection nor to guide or monitor treatment for MRSA infections. Performed at [facility], [address] [place], [place] [address]   Culture, Urine     Status: None    Collection Time: 02/01/21  2:06 PM   Specimen: Urine, Random  Result Value Ref Range Status   Specimen Description URINE, RANDOM  Final   Special Requests NONE  Final   Culture   Final    NO GROWTH Performed at [facility], [address], [place], [place] [address]    Report Status 02/02/2021 FINAL  Final  Culture, blood (routine x 2)     Status: None (Preliminary result)   Collection Time: 02/01/21  5:17 PM   Specimen: BLOOD RIGHT ARM  Result Value Ref Range Status   Specimen Description BLOOD RIGHT ARM  Final   Special Requests   Final    BOTTLES DRAWN AEROBIC ONLY Blood Culture results [place] not be optimal due to an inadequate volume of blood received in culture bottles   Culture   Final    NO GROWTH 2 DAYS Performed at [facility], [address] [place], [place] [address]    Report Status PENDING  Incomplete  Culture, blood (routine x 2)     Status: Abnormal (Preliminary result)   Collection Time: 02/01/21  5:17 PM   Specimen: BLOOD LEFT HAND  Result Value Ref Range Status   Specimen Description BLOOD LEFT HAND  Final   Special Requests   Final    BOTTLES DRAWN AEROBIC ONLY Blood Culture results [place] not be optimal due to an inadequate volume of blood received in culture bottles   Culture  Setup Time   Final    GRAM POSITIVE COCCI IN CLUSTERS AEROBIC BOTTLE ONLY CRITICAL RESULT CALLED TO, READ BACK BY AND VERIFIED WITH: J CARNEY [facility] 02/02/21 2137 JDW    Culture (A)  Final    STAPHYLOCOCCUS EPIDERMIDIS THE  SIGNIFICANCE OF ISOLATING THIS ORGANISM FROM A SINGLE SET OF BLOOD CULTURES WHEN MULTIPLE SETS ARE DRAWN IS UNCERTAIN. PLEASE NOTIFY THE MICROBIOLOGY DEPARTMENT WITHIN ONE WEEK IF SPECIATION AND SENSITIVITIES ARE REQUIRED. Performed at [facility], [address] [place], [place] [address]    Report Status PENDING  Incomplete  Blood Culture ID Panel (Reflexed)     Status: Abnormal   Collection Time: 02/01/21  5:17 PM  Result Value Ref Range  Status   Enterococcus faecalis NOT DETECTED NOT DETECTED Final   Enterococcus Faecium NOT DETECTED NOT DETECTED Final   Listeria monocytogenes NOT DETECTED NOT DETECTED Final   Staphylococcus species DETECTED (A) NOT DETECTED Final    Comment: CRITICAL RESULT CALLED TO, READ BACK BY AND VERIFIED WITH: J CARNEY PHARMD 02/02/21 2137 JDW    Staphylococcus aureus (BCID) NOT DETECTED NOT DETECTED Final   Staphylococcus epidermidis DETECTED (A) NOT DETECTED Final    Comment: Methicillin (oxacillin) resistant coagulase negative staphylococcus. Possible blood culture contaminant (unless isolated from more than one blood culture draw or clinical case suggests pathogenicity). No antibiotic treatment is indicated for blood  culture contaminants. CRITICAL RESULT CALLED TO, READ BACK BY AND VERIFIED WITH: J CARNEY PHARMD 02/02/21 2137 JDW    Staphylococcus lugdunensis NOT DETECTED NOT DETECTED Final   Streptococcus species NOT DETECTED NOT DETECTED Final   Streptococcus agalactiae NOT DETECTED NOT DETECTED Final   Streptococcus pneumoniae NOT DETECTED NOT DETECTED Final   Streptococcus pyogenes NOT DETECTED NOT DETECTED Final   A.calcoaceticus-baumannii NOT DETECTED NOT DETECTED Final   Bacteroides fragilis NOT DETECTED NOT DETECTED Final   Enterobacterales NOT DETECTED NOT DETECTED Final   Enterobacter cloacae complex NOT DETECTED NOT DETECTED Final   Escherichia coli NOT DETECTED NOT DETECTED Final   Klebsiella aerogenes NOT DETECTED NOT DETECTED Final   Klebsiella oxytoca NOT DETECTED NOT DETECTED Final   Klebsiella pneumoniae NOT DETECTED NOT DETECTED Final   Proteus species NOT DETECTED NOT DETECTED Final   Salmonella species NOT DETECTED NOT DETECTED Final   Serratia marcescens NOT DETECTED NOT DETECTED Final   Haemophilus influenzae NOT DETECTED NOT DETECTED Final   Neisseria meningitidis NOT DETECTED NOT DETECTED Final   Pseudomonas aeruginosa NOT DETECTED NOT DETECTED Final    Stenotrophomonas maltophilia NOT DETECTED NOT DETECTED Final   Candida albicans NOT DETECTED NOT DETECTED Final   Candida auris NOT DETECTED NOT DETECTED Final   Candida glabrata NOT DETECTED NOT DETECTED Final   Candida krusei NOT DETECTED NOT DETECTED Final   Candida parapsilosis NOT DETECTED NOT DETECTED Final   Candida tropicalis NOT DETECTED NOT DETECTED Final   Cryptococcus neoformans/gattii NOT DETECTED NOT DETECTED Final   Methicillin resistance mecA/C DETECTED (A) NOT DETECTED Final    Comment: CRITICAL RESULT CALLED TO, READ BACK BY AND VERIFIED WITH: Arnaldo Natal [facility] 02/02/21 2137 JDW Performed at [facility], [address], [place], [place] [address]   MRSA PCR Screening     Status: None   Collection Time: 02/02/21 11:30 PM   Specimen: Nasal Mucosa; Nasopharyngeal  Result Value Ref Range Status   MRSA by PCR NEGATIVE NEGATIVE Final    Comment:        The GeneXpert MRSA Assay (FDA approved for NASAL specimens only), is one component of a comprehensive MRSA colonization surveillance program. It is not intended to diagnose MRSA infection nor to guide or monitor treatment for MRSA infections. Performed at [facility], [address] [place], [place] [address]  Radiology Studies: DG CHEST PORT 1 VIEW  Result Date: 02/03/2021 CLINICAL DATA:  Tachypnea EXAM: PORTABLE CHEST 1 VIEW COMPARISON:  Radiograph 02/01/2021 FINDINGS: Vaginal a transesophageal feeding tube tip terminates near the gastric antrum/duodenal bulb. Telemetry leads overlie the chest. Nasal cannula overlies the upper chest. Continued increase in the patchy, heterogeneous bilateral pulmonary opacities most coalescent in the right upper lobe though increasingly dense throughout the left hemithorax as well. Stable cardiomediastinal contours accounting for differences in technique. No pneumothorax or visible layering effusion. No acute osseous or soft tissue abnormality. Degenerative changes are  present in the imaged spine and shoulders. IMPRESSION: Increasing bilateral opacities, particularly in the right upper lobe and throughout the left hemithorax. Could reflect worsening multifocal infection and/or edema. Stable cardiomegaly. Feeding tube tip near the gastric antrum/duodenal bulb. Electronically Signed   By: Kreg ShropshirePrice  DeHay M.D.   On: 02/03/2021 06:00    Pamella Pertostin Emony Dormer, MD, PhD [facility]  Between 7 am - 7 pm I am available, please contact me via Amion (for emergencies) or Securechat (non urgent messages)  Between 7 pm - 7 am I am not available, please contact night coverage MD/APP via Amion

## 2021-02-03 NOTE — Progress Notes (Signed)
Pt became less tachypnic after the second dose of haldol 2 mg IV.was down from 35 to 20 24 some time and   But has been fluctuating between 22-bpm to low 30's. Pt resting quietly now mouth care repeated and made pt comfortable

## 2021-02-04 ENCOUNTER — Inpatient Hospital Stay (HOSPITAL_COMMUNITY): Payer: Medicare HMO

## 2021-02-04 DIAGNOSIS — I639 Cerebral infarction, unspecified: Secondary | ICD-10-CM | POA: Diagnosis not present

## 2021-02-04 DIAGNOSIS — J9601 Acute respiratory failure with hypoxia: Secondary | ICD-10-CM | POA: Diagnosis not present

## 2021-02-04 DIAGNOSIS — I6601 Occlusion and stenosis of right middle cerebral artery: Secondary | ICD-10-CM | POA: Diagnosis not present

## 2021-02-04 DIAGNOSIS — I10 Essential (primary) hypertension: Secondary | ICD-10-CM | POA: Diagnosis not present

## 2021-02-04 DIAGNOSIS — U071 COVID-19: Secondary | ICD-10-CM | POA: Diagnosis not present

## 2021-02-04 DIAGNOSIS — R0682 Tachypnea, not elsewhere classified: Secondary | ICD-10-CM | POA: Diagnosis not present

## 2021-02-04 DIAGNOSIS — I69391 Dysphagia following cerebral infarction: Secondary | ICD-10-CM | POA: Diagnosis not present

## 2021-02-04 LAB — CBC WITH DIFFERENTIAL/PLATELET
Abs Immature Granulocytes: 0.03 10*3/uL (ref 0.00–0.07)
Basophils Absolute: 0 10*3/uL (ref 0.0–0.1)
Basophils Relative: 0 %
Eosinophils Absolute: 0 10*3/uL (ref 0.0–0.5)
Eosinophils Relative: 0 %
HCT: 32.3 % — ABNORMAL LOW (ref 39.0–52.0)
Hemoglobin: 9.9 g/dL — ABNORMAL LOW (ref 13.0–17.0)
Immature Granulocytes: 1 %
Lymphocytes Relative: 7 %
Lymphs Abs: 0.4 10*3/uL — ABNORMAL LOW (ref 0.7–4.0)
MCH: 29.5 pg (ref 26.0–34.0)
MCHC: 30.7 g/dL (ref 30.0–36.0)
MCV: 96.1 fL (ref 80.0–100.0)
Monocytes Absolute: 0.5 10*3/uL (ref 0.1–1.0)
Monocytes Relative: 8 %
Neutro Abs: 4.5 10*3/uL (ref 1.7–7.7)
Neutrophils Relative %: 84 %
Platelets: 423 10*3/uL — ABNORMAL HIGH (ref 150–400)
RBC: 3.36 MIL/uL — ABNORMAL LOW (ref 4.22–5.81)
RDW: 12.8 % (ref 11.5–15.5)
WBC: 5.4 10*3/uL (ref 4.0–10.5)
nRBC: 0 % (ref 0.0–0.2)

## 2021-02-04 LAB — MAGNESIUM: Magnesium: 2.9 mg/dL — ABNORMAL HIGH (ref 1.7–2.4)

## 2021-02-04 LAB — GLUCOSE, CAPILLARY
Glucose-Capillary: 134 mg/dL — ABNORMAL HIGH (ref 70–99)
Glucose-Capillary: 135 mg/dL — ABNORMAL HIGH (ref 70–99)
Glucose-Capillary: 110 mg/dL — ABNORMAL HIGH (ref 70–99)
Glucose-Capillary: 114 mg/dL — ABNORMAL HIGH (ref 70–99)
Glucose-Capillary: 118 mg/dL — ABNORMAL HIGH (ref 70–99)
Glucose-Capillary: 108 mg/dL — ABNORMAL HIGH (ref 70–99)

## 2021-02-04 LAB — COMPREHENSIVE METABOLIC PANEL
ALT: 71 U/L — ABNORMAL HIGH (ref 0–44)
AST: 72 U/L — ABNORMAL HIGH (ref 15–41)
Albumin: 2.5 g/dL — ABNORMAL LOW (ref 3.5–5.0)
Alkaline Phosphatase: 168 U/L — ABNORMAL HIGH (ref 38–126)
Anion gap: 12 (ref 5–15)
BUN: 33 mg/dL — ABNORMAL HIGH (ref 8–23)
CO2: 22 mmol/L (ref 22–32)
Calcium: 8.6 mg/dL — ABNORMAL LOW (ref 8.9–10.3)
Chloride: 120 mmol/L — ABNORMAL HIGH (ref 98–111)
Creatinine, Ser: 0.86 mg/dL (ref 0.61–1.24)
GFR, Estimated: >60 mL/min (ref >60)
Glucose, Bld: 159 mg/dL — ABNORMAL HIGH (ref 70–99)
Potassium: 3.8 mmol/L (ref 3.5–5.1)
Sodium: 154 mmol/L — ABNORMAL HIGH (ref 135–145)
Total Bilirubin: 1.4 mg/dL — ABNORMAL HIGH (ref 0.3–1.2)
Total Protein: 6.6 g/dL (ref 6.5–8.1)

## 2021-02-04 LAB — D-DIMER, QUANTITATIVE: D-Dimer, Quant: 3.03 ug/mL-FEU — ABNORMAL HIGH (ref 0.00–0.50)

## 2021-02-04 LAB — C-REACTIVE PROTEIN: CRP: 19 mg/dL — ABNORMAL HIGH (ref <1.0)

## 2021-02-04 MED ORDER — FREE WATER
400.0000 mL | Status: DC
  Administered 2021-02-04 – 2021-02-08 (×26): 400 mL

## 2021-02-04 MED ORDER — SODIUM CHLORIDE 0.9 % IV SOLN
3.0000 g | Freq: Three times a day (TID) | INTRAVENOUS | Status: DC
  Administered 2021-02-04 – 2021-02-06 (×6): 3 g via INTRAVENOUS
  Filled 2021-02-04: qty 3
  Filled 2021-02-04: qty 8
  Filled 2021-02-04 (×3): qty 3
  Filled 2021-02-04 (×2): qty 8
  Filled 2021-02-04: qty 3

## 2021-02-04 NOTE — Progress Notes (Signed)
Pharmacy Antibiotic Note  Leonard [name] is a 65 y.o. male admitted on 01/24/2021 with stroke.  Pharmacy has been consulted for Unasyn dosing for aspiration pneumonia.  afebrile, WBC 5.4, cultures ordered.  Plan: Restart Unasyn 3 gm IV q8h. Follow renal function, culture data, clinical progress and antibiotic plans.  Height: 5\' 9"  (175.3 cm) Weight: 79 kg (174 lb 2.6 oz) IBW/kg (Calculated) : 70.7  Temp (24hrs), Avg:98.3 F (36.8 C), Min:98.2 F (36.8 C), Max:98.6 F (37 C)  Recent Labs  Lab 01/30/21 0820 02/01/21 0350 02/02/21 0108 02/03/21 0259 02/04/21 0300  WBC 12.2* 15.5* 15.0* 13.0* 5.4  CREATININE 0.80 0.72 0.84 0.95 0.86    Estimated Creatinine Clearance: 85.6 mL/min (by C-G formula based on SCr of 0.86 mg/dL).    No Known Allergies  Antimicrobials this admission:  Unasyn 4/28 >>4/29, 5/1>> Cefepime 4/29>>5/1 Remdesivir 4/30>>  Dose adjustments this admission:  n/a  Microbiology results:  4/28 urine: NGF  4/28 blood x 2: CNS (contaminant)  4/28 sputum:  4/21 MRSA PCR: negative  4/20 COVID: positive  Leonard [name] A. 5/20, PharmD, BCPS, FNKF Clinical Pharmacist [place] Please utilize Amion for appropriate phone number to reach the unit pharmacist [facility])   02/04/2021 10:22 AM

## 2021-02-04 NOTE — Progress Notes (Signed)
STROKE TEAM PROGRESS NOTE   INTERVAL HISTORY No family at bedside.  Patient awake alert, mildly agitated, actively moving bilateral lower extremity and right arm.  Still has left upper extremity weakness.  Still has increased work risk, however maintaining O2 saturation mid 80s with 4 L oxygen, down from 6 liter yesterday.  Sodium 154, on free water.  Vitals:   02/03/21 2100 02/03/21 2156 02/03/21 2328 02/04/21 0012  BP:  121/63  (!) 115/55  Pulse:  80  74  Resp: (!) 33 (!) 37  (!) 36  Temp:    98.4 F (36.9 C)  TempSrc:    Axillary  SpO2: (!) 88% 91% 93% 94%  Weight:      Height:       CBC:  Recent Labs  Lab 02/01/21 0350 02/02/21 0108 02/03/21 0259 02/04/21 0300  WBC 15.5*   < > 13.0* 5.4  NEUTROABS 12.1*  --   --  4.5  HGB 10.8*   < > 9.7* 9.9*  HCT 33.7*   < > 30.7* 32.3*  MCV 93.4   < > 93.9 96.1  PLT 366   < > 369 423*   < > = values in this interval not displayed.   Basic Metabolic Panel:  Recent Labs  Lab 02/03/21 0259 02/04/21 0300  NA 152* 154*  K 3.7 3.8  CL 120* 120*  CO2 21* 22  GLUCOSE 177* 159*  BUN 32* 33*  CREATININE 0.95 0.86  CALCIUM 8.3* 8.6*  MG  --  2.9*    IMAGING   CT head Result Date: 01/27/2021 Impression Expected evolution of right MCA territory infarct with unchanged  multifocal intraparenchymal and subarachnoid blood. No worsened midline shift.  MRA head/MRI brain Result Date: 01/26/2021 IMPRESSION: 1. 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 No hemorrhage or mass effect. 2. Punctate foci of acute ischemia in the right temporal lobe and right thalamus. 3. Normal intracranial MRA.  DG CHEST PORT 1 VIEW Result Date: 01/25/2021 IMPRESSION:  1.  Endotracheal tube and NG tube in good anatomic position.  2.  Cardiomegaly.  No pulmonary venous congestion.  3.  Low lung volumes with bibasilar atelectasis.   DG CHEST PORT 1 VIEW 11/05/2020 Increasing bilateral  opacities, particularly in the right upper lobe and throughout the left hemithorax. Could reflect worsening multifocal infection and/or edema. Stable cardiomegaly. Feeding tube tip near the gastric antrum/duodenal bulb.   CT CEREBRAL PERFUSION W CONTRAST Result Date: 01/24/2021 IMPRESSION:  1. 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  2. 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  3. 68 mL right MCA territory core infarct with 133 mL area of ischemic penumbra.   CT HEAD CODE STROKE WO CONTRAST Result Date: 01/24/2021 IMPRESSION: 1. 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 2. Hyperdense appearance of the right MCA. 3. ASPECTS is 7.   CT ANGIO HEAD/NECK CODE STROKE Result Date: 01/24/2021 IMPRESSION:  1. 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  2. 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 3. 68 mL right MCA territory core infarct with 133 mL area of ischemic penumbra.  PHYSICAL EXAM  Temp:  [98.2 F (36.8 C)-98.4 F (36.9 C)] 98.4 F (36.9 C) (05/01 0012) Pulse Rate:  [  74-80] 74 (05/01 0012) Resp:  [33-37] 36 (05/01 0012) BP: (115-157)/(55-72) 115/55 (05/01 0012) SpO2:  [88 %-94 %] 94 % (05/01 0012)  General - Well nourished, well developed, still has increased WOB.  Ophthalmologic - fundi not visualized due to noncooperation.  Cardiovascular - Regular rhythm and rate.  Neuro - awake, alert, eyes open, severe dysarthric with intangible speech. No spontaneous speech, but following limited simple commands. Not able to name but seems able to repeat 3-word sentences in severe dysarthric voice. Right gaze preference and left gaze incomplete, tracking bilaterally, visual field blinking to visual threat on the right but not on the left, PERRL. Left  facial droop. Tongue protrusion not cooperative. LUE 2-/5 proximal but 0/5 distal. RUE and bilateral lower extremity actively moving against gravity. Sensation, coordination not cooperative and gait not tested.   ASSESSMENT/[name] is a 65 y.o. male with no significant PMH presenting with left hemiplegia, left facial droop, and dysarthria.  Was found to have an acute right MCA territory infarct s/p tPA and bilateral common carotid arteriograms followed by right MCA/ACA/ICA thrombus retrieval with stent angioplasty..   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 Per Dr. 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   Code Stroke Acute right MCA territory infarct ASPECTS 7   CTA head & neck: Complete occlusion of the R ICA and R MCA   CT perfusion 68 mL right MCA territory core infarct with 133 mL area of ischemic penumbra.  S/p IR with R MCA TICI3 and R carotid stenting.  MRI: large acute infarct of the right MCA territory involving the frontal operculum, insula and basal ganglia with hemorrhagic conversion   MRA R MCA patent  Right carotid duplex: ICA stent appears patent  Repeat CT head (4/23): evolution of right MCA infarct. Overall unchanged   2D Echo: EF 60-65%, mild LVH  LDL 126  HgbA1c 5.8  UDS negative  VTE prophylaxis - SCDs  Diet: NPO, Tube feeding, swallow not progressing. Consider PEG placement   aspirin 81 mg daily prior to admission, now on aspirin 81 mg daily and Brilinta (ticagrelor) 90 mg bid.   Consider 30-day cardiac event monitor as outpatient to rule out A. Fib.  Therapy recommendations:  CIR but not up to 3 hours a day tolerance, rehab coordinator monitoring, [place] need SNF as back up [name]   Disposition: pending  Carotid occlusion  CTA neck: Complete occlusion of the R ICA   S/p IR with R carotid stenting.  Per Dr. 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  Right carotid  duplex: ICA stent appears patent  On aspirin and Brilinta DAPT.  COVID Fever Respiratory distress  Pt is unvaccinated for COVID  SARS - negative on 01/24/21  SARS - positive on 02/03/21  CXR worsening multifocal pneumonia  On [place] 6L->4L  On remdesivir, Actemra, and steroids  On cefepime  WBCs - 15.5->13.0->5.4  Hypernatremia   Na 143->145->148->152->154  Likely due to fever and respiratory water loss  On TF and FW  Increase FW 200 Q4 -> 400 Q4  Hypertension  Home meds:  none . SBP goal now < 160 given hemorrhagic conversion . Long-term BP goal normotensive . Continue Norvasc and add labetalol.  Hyperlipidemia  Home meds:  none  LDL 126, goal < 70  Add Lipitor 80g daily  Continue statin at discharge  Dysphagia  Secondary to stroke  NPO  SLP on  board  [place]ak placed on TF @ 50cc  On FW  Other Stroke Risk Factors  Advanced Age >/= 85    Other Active Problems  Code status - currently full code  Hypokalemia: -> now 3.7->3.8  Hypermagnesemia - Magnesium - 2.9 (1.7 - 2.4)  Hospital day #10  Leonard Plan, MD PhD Stroke Neurology 02/04/2021 7:36 PM   To contact Stroke Continuity provider, please refer to [email or web address]. After hours, contact General Neurology

## 2021-02-04 NOTE — Progress Notes (Signed)
Per report from off coming shift, Patient restraints since 1000am and no foley present on admission. Patient currently has condom cath in place with appropriate OP. Will cont to monitor.

## 2021-02-04 NOTE — Progress Notes (Signed)
Patient was on 6L HiFLo and now maintain between 91 to 96% on 4L HiFlo. Report given to oncoming RN.

## 2021-02-04 NOTE — Progress Notes (Signed)
PROGRESS NOTE  Leonard FriendsRobert M [name] ZOX:[id number] DOB: [birth year] DOA: 01/24/2021 PCP: Pcp, No   LOS: 10 days   Brief Narrative / Interim history: 65 year old male with no significant history came into the hospital with left-sided weakness, left facial droop and dysarthria.  He was found to have an acute right MCA territory infarct status post tPA and bilateral common carotid arteriograms followed by right MCA/ACA/ICA thrombus retrieval with stent angioplasty.  He was admitted in the neuro ICU initially.  Hospitalist service was consulted on 4/29 due to increased work of breathing  Subjective / 24h Interval events: Breathing seems to be better, not as tachypneic.  Poorly responsive, tries to wake up on sternal rub but cannot stay awake  Assessment & Plan: Principal Problem Acute hypoxic respiratory failure due to Covid 19 pneumonia, possible aspiration pneumonia with sepsis physiology -patient has been having increased work of breathing for few days per wife but on 4/29 much more so and hospitalist service was consulted.  He was placed on antibiotics due to concern for aspiration given poor mental status. -Chest x-ray with developing multifocal pneumonia.  COVID was sent 4/30 and returned positive -Status post Actemra on 4/30.  Respiratory status seems a little bit better today -Started on remdesivir and steroids.  Continue -Procalcitonin relatively low, but cannot fully rule out ongoing aspiration.  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  His MRI on admission showed large acute infarct of the  right MCA territory involving the frontal operculum, insula and basal ganglia with hemorrhagic conversion.  A 2D echo showed EF 60-65%, mild LVH.  LDL is 126.  A1c is 5.8.  He is currently on aspirin and Brilinta.  Has a core track for feeding and family agrees to PEG but will need to get over Covid first. -Repeat CT scan due to poor mental status today  Cerebral edema -goal sodium 145-150.  Hyponatremic today, increase free water flushes  Essential hypertension-continue amlodipine, labetalol  Hyperlipidemia-continue statin  Dysphagia-due to stroke, has a core track, continue tube feeds.  Family wants PEG  Scheduled Meds: . albuterol  2 puff Inhalation QID  . amLODipine  10 mg Per Tube Daily  . vitamin C  500 mg Oral Daily  . aspirin  81 mg Oral Daily   Or  . aspirin  81 mg Per Tube Daily  . atorvastatin  80 mg Per Tube Daily  . chlorhexidine  15 mL Mouth Rinse BID  . Chlorhexidine Gluconate Cloth  6 each Topical Daily  . feeding supplement (PROSource TF)  45 mL Per Tube TID  . free water  200 mL Per Tube Q4H  . labetalol  200 mg Per Tube BID  . mouth rinse  15 mL Mouth Rinse q12n4p  . methylPREDNISolone (SOLU-MEDROL) injection  0.5 mg/kg Intravenous Q12H   Followed by  . [START ON 02/06/2021] predniSONE  50 mg Oral Daily  . senna-docusate  1 tablet Per Tube Daily  . ticagrelor  90 mg Oral BID   Or  . ticagrelor  90 mg Per Tube BID  . zinc sulfate  220 mg Oral Daily  Continuous Infusions: . ceFEPime (MAXIPIME) IV 2 g (02/04/21 0639)  . feeding supplement (OSMOLITE 1.5 CAL) 1,000 mL (02/04/21 0642)  . remdesivir 100 mg in NS 100 mL 100 mg (02/04/21 0958)   PRN Meds:.acetaminophen **OR** acetaminophen (TYLENOL) oral liquid 160 mg/5 mL **OR** acetaminophen, labetalol  Diet Orders (From admission, onward)    Start     Ordered   01/25/21 0300  Diet NPO time specified  Diet effective now        01/25/21 0259          DVT prophylaxis: SCD's Start: 01/25/21 0006     Code  Status: Full Code  Family Communication: Discussed with wife over the phone  Status is: Inpatient  Remains inpatient appropriate because:Inpatient level of care appropriate due to severity of illness   Dispo: The patient is from: Home              Anticipated d/c is to: CIR              Patient currently is not medically stable to d/c.   Difficult to place patient No   Level of care: Progressive  Consultants:  Neurology   Procedures:  2D echo  Microbiology  1/4 bottles-staph epi blood cultures 4/28 SARS-CoV-2 + on 4/30  Antimicrobials: Cefepime 4/30 >> 5/1 Unasyn 5/1 >> Remdesivir 4/30 >>  Objective: Vitals:   02/03/21 2328 02/04/21 0012 02/04/21 0412 02/04/21 0754  BP:  (!) 115/55 127/72 117/63  Pulse:  74 75   Resp:  (!) 36 (!) 38   Temp:  98.4 F (36.9 C) 98.3 F (36.8 C) 98.6 F (37 C)  TempSrc:  Axillary Axillary Axillary  SpO2: 93% 94% 93%   Weight:      Height:        Intake/Output Summary (Last 24 hours) at 02/04/2021 1017 Last data filed at 02/03/2021 1650 Gross per 24 hour  Intake 602.74 ml  Output 600 ml  Net 2.74 ml   Filed Weights   01/30/21 0532 01/31/21 0400 02/03/21 0500  Weight: 79.7 kg 79.7 kg 79 kg    Examination:  Constitutional: Lethargic Eyes: No icterus ENMT: Dry mucous membranes Neck: normal, supple Respiratory: Diffuse bilateral rhonchi, alternating normal respirations with episodes of tachypnea Cardiovascular: Regular rate and rhythm, no murmurs, no edema Abdomen: Nondistended, no tenderness apparently, bowel sounds positive Musculoskeletal: no clubbing / cyanosis.  Skin: No rashes seen Neurologic: Quite lethargic   Data Reviewed: I have independently reviewed following labs and imaging studies   CBC: Recent Labs  Lab 01/29/21 1503 01/30/21 0820 02/01/21 0350 02/02/21 0108 02/03/21 0259 02/04/21 0300  WBC 11.2* 12.2* 15.5* 15.0* 13.0* 5.4  NEUTROABS 8.9* 9.5* 12.1*  --   --  4.5  HGB 10.9* 11.6* 10.8* 11.0*  9.7* 9.9*  HCT 33.0* 35.7* 33.7* 35.2* 30.7* 32.3*  MCV 92.2 93.2 93.4 93.6 93.9 96.1  PLT 268 295 366 370 369 423*   Basic Metabolic Panel: Recent Labs  Lab 01/30/21 0820 02/01/21 0350 02/02/21 0108 02/03/21 0259 02/04/21 0300  NA 143 145 148* 152* 154*  K 4.0 3.6 4.0 3.7 3.8  CL 110 113* 116* 120* 120*  CO2 24 21* 23 21* 22  GLUCOSE 127* 134* 136* 177* 159*  BUN 21 25* 27* 32* 33*  CREATININE 0.80 0.72 0.84 0.95 0.86  CALCIUM 8.4* 8.6* 8.6* 8.3* 8.6*  MG  --   --   --   --  2.9*   Liver Function Tests: Recent Labs  Lab  01/30/21 0820 02/04/21 0300  AST 43* 72*  ALT 47* 71*  ALKPHOS 78 168*  BILITOT 0.8 1.4*  PROT 6.4* 6.6  ALBUMIN 3.2* 2.5*   Coagulation Profile: No results for input(s): INR, PROTIME in the last 168 hours. HbA1C: No results for input(s): HGBA1C in the last 72 hours. CBG: Recent Labs  Lab 02/03/21 1155 02/03/21 2007 02/03/21 2319 02/04/21 0351 02/04/21 0752  GLUCAP 142* 156* 132* 134* 135*    Recent Results (from the past 240 hour(s))  Culture, Urine     Status: None   Collection Time: 02/01/21  2:06 PM   Specimen: Urine, Random  Result Value Ref Range Status   Specimen Description URINE, RANDOM  Final   Special Requests NONE  Final   Culture   Final    NO GROWTH Performed at [facility], [address], [place], [place] [address]    Report Status 02/02/2021 FINAL  Final  Culture, blood (routine x 2)     Status: None (Preliminary result)   Collection Time: 02/01/21  5:17 PM   Specimen: BLOOD RIGHT ARM  Result Value Ref Range Status   Specimen Description BLOOD RIGHT ARM  Final   Special Requests   Final    BOTTLES DRAWN AEROBIC ONLY Blood Culture results [place] not be optimal due to an inadequate volume of blood received in culture bottles   Culture   Final    NO GROWTH 2 DAYS Performed at [facility], [address], [place], [place] [address]    Report Status PENDING  Incomplete  Culture, blood (routine x 2)      Status: Abnormal (Preliminary result)   Collection Time: 02/01/21  5:17 PM   Specimen: BLOOD LEFT HAND  Result Value Ref Range Status   Specimen Description BLOOD LEFT HAND  Final   Special Requests   Final    BOTTLES DRAWN AEROBIC ONLY Blood Culture results [place] not be optimal due to an inadequate volume of blood received in culture bottles   Culture  Setup Time   Final    GRAM POSITIVE COCCI IN CLUSTERS AEROBIC BOTTLE ONLY CRITICAL RESULT CALLED TO, READ BACK BY AND VERIFIED WITH: J CARNEY [facility] 02/02/21 2137 JDW    Culture (A)  Final    STAPHYLOCOCCUS EPIDERMIDIS STAPHYLOCOCCUS CAPITIS THE SIGNIFICANCE OF ISOLATING THIS ORGANISM FROM A SINGLE SET OF BLOOD CULTURES WHEN MULTIPLE SETS ARE DRAWN IS UNCERTAIN. PLEASE NOTIFY THE MICROBIOLOGY DEPARTMENT WITHIN ONE WEEK IF SPECIATION AND SENSITIVITIES ARE REQUIRED. CULTURE REINCUBATED FOR BETTER GROWTH Performed at [facility], [address], [place], [place] [address]    Report Status PENDING  Incomplete  Blood Culture ID Panel (Reflexed)     Status: Abnormal   Collection Time: 02/01/21  5:17 PM  Result Value Ref Range Status   Enterococcus faecalis NOT DETECTED NOT DETECTED Final   Enterococcus Faecium NOT DETECTED NOT DETECTED Final   Listeria monocytogenes NOT DETECTED NOT DETECTED Final   Staphylococcus species DETECTED (A) NOT DETECTED Final    Comment: CRITICAL RESULT CALLED TO, READ BACK BY AND VERIFIED WITH: J CARNEY PHARMD 02/02/21 2137 JDW    Staphylococcus aureus (BCID) NOT DETECTED NOT DETECTED Final   Staphylococcus epidermidis DETECTED (A) NOT DETECTED Final    Comment: Methicillin (oxacillin) resistant coagulase negative staphylococcus. Possible blood culture contaminant (unless isolated from more than one blood culture draw or clinical case suggests pathogenicity). No antibiotic treatment is indicated for blood  culture contaminants. CRITICAL RESULT CALLED  TO, READ BACK BY AND VERIFIED WITH: J CARNEY PHARMD  02/02/21 2137 JDW    Staphylococcus lugdunensis NOT DETECTED NOT DETECTED Final   Streptococcus species NOT DETECTED NOT DETECTED Final   Streptococcus agalactiae NOT DETECTED NOT DETECTED Final   Streptococcus pneumoniae NOT DETECTED NOT DETECTED Final   Streptococcus pyogenes NOT DETECTED NOT DETECTED Final   A.calcoaceticus-baumannii NOT DETECTED NOT DETECTED Final   Bacteroides fragilis NOT DETECTED NOT DETECTED Final   Enterobacterales NOT DETECTED NOT DETECTED Final   Enterobacter cloacae complex NOT DETECTED NOT DETECTED Final   Escherichia coli NOT DETECTED NOT DETECTED Final   Klebsiella aerogenes NOT DETECTED NOT DETECTED Final   Klebsiella oxytoca NOT DETECTED NOT DETECTED Final   Klebsiella pneumoniae NOT DETECTED NOT DETECTED Final   Proteus species NOT DETECTED NOT DETECTED Final   Salmonella species NOT DETECTED NOT DETECTED Final   Serratia marcescens NOT DETECTED NOT DETECTED Final   Haemophilus influenzae NOT DETECTED NOT DETECTED Final   Neisseria meningitidis NOT DETECTED NOT DETECTED Final   Pseudomonas aeruginosa NOT DETECTED NOT DETECTED Final   Stenotrophomonas maltophilia NOT DETECTED NOT DETECTED Final   Candida albicans NOT DETECTED NOT DETECTED Final   Candida auris NOT DETECTED NOT DETECTED Final   Candida glabrata NOT DETECTED NOT DETECTED Final   Candida krusei NOT DETECTED NOT DETECTED Final   Candida parapsilosis NOT DETECTED NOT DETECTED Final   Candida tropicalis NOT DETECTED NOT DETECTED Final   Cryptococcus neoformans/gattii NOT DETECTED NOT DETECTED Final   Methicillin resistance mecA/C DETECTED (A) NOT DETECTED Final    Comment: CRITICAL RESULT CALLED TO, READ BACK BY AND VERIFIED WITH: Arnaldo [facility] 02/02/21 2137 JDW Performed at [facility], [address], [place], [place] [address]   MRSA PCR Screening     Status: None   Collection Time: 02/02/21 11:30 PM   Specimen: Nasal Mucosa; Nasopharyngeal  Result Value Ref Range Status    MRSA by PCR NEGATIVE NEGATIVE Final    Comment:        The GeneXpert MRSA Assay (FDA approved for NASAL specimens only), is one component of a comprehensive MRSA colonization surveillance program. It is not intended to diagnose MRSA infection nor to guide or monitor treatment for MRSA infections. Performed at [facility], [address] [place], [place] [address]   Resp Panel by RT-PCR (Flu A&B, Covid) Nasopharyngeal Swab     Status: Abnormal   Collection Time: 02/03/21  9:38 AM   Specimen: Nasopharyngeal Swab; Nasopharyngeal(NP) swabs in vial transport medium  Result Value Ref Range Status   SARS Coronavirus 2 by RT PCR POSITIVE (A) NEGATIVE Final    Comment: RESULT CALLED TO, READ BACK BY AND VERIFIED WITH: A,PENNINGTON RN  02/03/21 EB (NOTE) SARS-CoV-2 target nucleic acids are DETECTED.  The SARS-CoV-2 RNA is generally detectable in upper respiratory specimens during the acute phase of infection. Positive results are indicative of the presence of the identified virus, but do not rule out bacterial infection or co-infection with other pathogens not detected by the test. Clinical correlation with patient history and other diagnostic information is necessary to determine patient infection status. The expected result is Negative.  Fact Sheet for Patients: BloggerCourse.com  Fact Sheet for Healthcare Providers: SeriousBroker.it  This test is not yet approved or cleared by the [place] FDA and  has been authorized for detection and/or diagnosis of SARS-CoV-2 by FDA under an Emergency Use Authorization (EUA).  This EUA will remain in effect (meaning this  test can be  used) for the duration of  the COVID-19 declaration under Section 564(b)(1) of the Act, 21 U.S.C. section 360bbb-3(b)(1), unless the authorization is terminated or revoked sooner.     Influenza A by PCR NEGATIVE NEGATIVE Final   Influenza B by  PCR NEGATIVE NEGATIVE Final    Comment: (NOTE) The Xpert Xpress SARS-CoV-2/FLU/RSV plus assay is intended as an aid in the diagnosis of influenza from Nasopharyngeal swab specimens and should not be used as a sole basis for treatment. Nasal washings and aspirates are unacceptable for Xpert Xpress SARS-CoV-2/FLU/RSV testing.  Fact Sheet for Patients: BloggerCourse.com  Fact Sheet for Healthcare Providers: SeriousBroker.it  This test is not yet approved or cleared by the [place] FDA and has been authorized for detection and/or diagnosis of SARS-CoV-2 by FDA under an Emergency Use Authorization (EUA). This EUA will remain in effect (meaning this test can be used) for the duration of the COVID-19 declaration under Section 564(b)(1) of the Act, 21 U.S.C. section 360bbb-3(b)(1), unless the authorization is terminated or revoked.  Performed at [facility], [address], [place], [place] [address]      Radiology Studies: No results found.  Pamella Pert, MD, PhD [facility]  Between 7 am - 7 pm I am available, please contact me via Amion (for emergencies) or Securechat (non urgent messages)  Between 7 pm - 7 am I am not available, please contact night coverage MD/APP via Amion

## 2021-02-05 DIAGNOSIS — I6601 Occlusion and stenosis of right middle cerebral artery: Secondary | ICD-10-CM | POA: Diagnosis not present

## 2021-02-05 DIAGNOSIS — J9601 Acute respiratory failure with hypoxia: Secondary | ICD-10-CM | POA: Diagnosis not present

## 2021-02-05 DIAGNOSIS — I10 Essential (primary) hypertension: Secondary | ICD-10-CM | POA: Diagnosis not present

## 2021-02-05 DIAGNOSIS — R0682 Tachypnea, not elsewhere classified: Secondary | ICD-10-CM | POA: Diagnosis not present

## 2021-02-05 DIAGNOSIS — I69391 Dysphagia following cerebral infarction: Secondary | ICD-10-CM | POA: Diagnosis not present

## 2021-02-05 DIAGNOSIS — U071 COVID-19: Secondary | ICD-10-CM | POA: Diagnosis not present

## 2021-02-05 DIAGNOSIS — I63031 Cerebral infarction due to thrombosis of right carotid artery: Secondary | ICD-10-CM | POA: Diagnosis not present

## 2021-02-05 LAB — COMPREHENSIVE METABOLIC PANEL
ALT: 95 U/L — ABNORMAL HIGH (ref 0–44)
AST: 93 U/L — ABNORMAL HIGH (ref 15–41)
Albumin: 2.4 g/dL — ABNORMAL LOW (ref 3.5–5.0)
Alkaline Phosphatase: 146 U/L — ABNORMAL HIGH (ref 38–126)
Anion gap: 9 (ref 5–15)
BUN: 33 mg/dL — ABNORMAL HIGH (ref 8–23)
CO2: 22 mmol/L (ref 22–32)
Calcium: 8.5 mg/dL — ABNORMAL LOW (ref 8.9–10.3)
Chloride: 125 mmol/L — ABNORMAL HIGH (ref 98–111)
Creatinine, Ser: 0.93 mg/dL (ref 0.61–1.24)
GFR, Estimated: >60 mL/min (ref >60)
Glucose, Bld: 169 mg/dL — ABNORMAL HIGH (ref 70–99)
Potassium: 4.1 mmol/L (ref 3.5–5.1)
Sodium: 156 mmol/L — ABNORMAL HIGH (ref 135–145)
Total Bilirubin: 1 mg/dL (ref 0.3–1.2)
Total Protein: 6.3 g/dL — ABNORMAL LOW (ref 6.5–8.1)

## 2021-02-05 LAB — D-DIMER, QUANTITATIVE: D-Dimer, Quant: 2.99 ug/mL-FEU — ABNORMAL HIGH (ref 0.00–0.50)

## 2021-02-05 LAB — CBC WITH DIFFERENTIAL/PLATELET
Abs Immature Granulocytes: 0.05 10*3/uL (ref 0.00–0.07)
Basophils Absolute: 0 10*3/uL (ref 0.0–0.1)
Basophils Relative: 0 %
Eosinophils Absolute: 0.1 10*3/uL (ref 0.0–0.5)
Eosinophils Relative: 3 %
HCT: 31.7 % — ABNORMAL LOW (ref 39.0–52.0)
Hemoglobin: 9.9 g/dL — ABNORMAL LOW (ref 13.0–17.0)
Immature Granulocytes: 1 %
Lymphocytes Relative: 9 %
Lymphs Abs: 0.4 10*3/uL — ABNORMAL LOW (ref 0.7–4.0)
MCH: 29.3 pg (ref 26.0–34.0)
MCHC: 31.2 g/dL (ref 30.0–36.0)
MCV: 93.8 fL (ref 80.0–100.0)
Monocytes Absolute: 0.3 10*3/uL (ref 0.1–1.0)
Monocytes Relative: 6 %
Neutro Abs: 3.4 10*3/uL (ref 1.7–7.7)
Neutrophils Relative %: 81 %
Platelets: 413 10*3/uL — ABNORMAL HIGH (ref 150–400)
RBC: 3.38 MIL/uL — ABNORMAL LOW (ref 4.22–5.81)
RDW: 13.2 % (ref 11.5–15.5)
WBC: 4.2 10*3/uL (ref 4.0–10.5)
nRBC: 0 % (ref 0.0–0.2)

## 2021-02-05 LAB — CULTURE, BLOOD (ROUTINE X 2)

## 2021-02-05 LAB — GLUCOSE, CAPILLARY
Glucose-Capillary: 122 mg/dL — ABNORMAL HIGH (ref 70–99)
Glucose-Capillary: 123 mg/dL — ABNORMAL HIGH (ref 70–99)
Glucose-Capillary: 127 mg/dL — ABNORMAL HIGH (ref 70–99)
Glucose-Capillary: 133 mg/dL — ABNORMAL HIGH (ref 70–99)
Glucose-Capillary: 134 mg/dL — ABNORMAL HIGH (ref 70–99)
Glucose-Capillary: 150 mg/dL — ABNORMAL HIGH (ref 70–99)

## 2021-02-05 LAB — MAGNESIUM: Magnesium: 2.8 mg/dL — ABNORMAL HIGH (ref 1.7–2.4)

## 2021-02-05 LAB — C-REACTIVE PROTEIN: CRP: 11.3 mg/dL — ABNORMAL HIGH (ref <1.0)

## 2021-02-05 MED ORDER — METHYLPREDNISOLONE SODIUM SUCC 40 MG IJ SOLR
0.5000 mg/kg | Freq: Two times a day (BID) | INTRAMUSCULAR | Status: AC
  Administered 2021-02-06: 39.6 mg via INTRAVENOUS

## 2021-02-05 MED ORDER — ASCORBIC ACID 500 MG PO TABS
500.0000 mg | ORAL_TABLET | Freq: Every day | ORAL | Status: DC
  Administered 2021-02-06 – 2021-02-14 (×9): 500 mg
  Filled 2021-02-05 (×9): qty 1

## 2021-02-05 MED ORDER — DEXTROSE 5 % IV SOLN: INTRAVENOUS | Status: AC

## 2021-02-05 MED ORDER — PREDNISONE 50 MG PO TABS
50.0000 mg | ORAL_TABLET | Freq: Every day | ORAL | Status: DC
  Administered 2021-02-06 – 2021-02-11 (×6): 50 mg
  Filled 2021-02-05 (×6): qty 1

## 2021-02-05 MED ORDER — ALBUTEROL SULFATE HFA 108 (90 BASE) MCG/ACT IN AERS
2.0000 | INHALATION_SPRAY | RESPIRATORY_TRACT | Status: DC | PRN
  Filled 2021-02-05: qty 6.7

## 2021-02-05 NOTE — Progress Notes (Signed)
Physical Therapy Treatment Patient Details Name: Leonard [name] MRN: [id number] DOB: 04-29-1956 Today's Date: 02/05/2021    History of Present Illness 65 y.o. male who presented 4/20 with acute onset L hemiplegia, L facial droop, dysarthria, and L hemineglect. tPA was administered. 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 S/p bil common carotid arteriograms and revascularization of occluded R MCA and R ACA prox and R ICA terminus. ETT 4/20-4/21. No significant PMH.    PT Comments    Today's skilled session continued to address mobility and strengthening. Pt resisting rolling today and at times exercises. Once done with being cleaned up pt kept eyes closed throughout remainder of session. Pt's SaO2 92-93% on 4 lpm [place] with session. Acute PT to continue during pt's hospital stay.     Follow Up Recommendations  CIR;Supervision/Assistance - 24 hour     Equipment Recommendations  3in1 (PT);Wheelchair (measurements PT);Wheelchair cushion (measurements PT);Other (comment)    Precautions / Restrictions Precautions Precautions: Fall Precaution Comments: L neglect; R wrist restraint. Covid precautions Restrictions Weight Bearing Restrictions: No    Mobility  Bed Mobility Overal bed mobility: Needs Assistance Bed Mobility: Rolling Rolling: Total assist;+2 for safety/equipment;Max assist         General bed mobility comments: pt noted to be soiled with bowels on arrival. total assist to roll left and right, max assist to maitain sidelying each side for percare. NT present to assist with mobility and cleaning pt. Pt pushing posterior when rolling onto side. When asked if he feel like he is falling pt stated "yes". Reassurance provided. This was the only intelligable word spoken  in session, pt mostly moaning and nodding head yes/no with rest of session.      Modified Rankin (Stroke Patients Only) Modified Rankin (Stroke Patients Only) Pre-Morbid Rankin Score: No symptoms Modified Rankin: Moderately severe disability     Cognition Arousal/Alertness: Awake/alert Behavior During Therapy: Flat affect Overall Cognitive Status: Impaired/Different from baseline Area of Impairment: Memory;Attention;Following commands;Safety/judgement;Awareness;Problem solving;Orientation                 Orientation Level:  (unable to assess as pt non verbal with session, would track eyes when name called) Current Attention Level: Sustained   Following Commands: Follows one step commands inconsistently;Follows one step commands with increased time Safety/Judgement: Decreased awareness of safety;Decreased awareness of deficits Awareness: Intellectual Problem Solving: Slow processing;Decreased initiation;Difficulty sequencing;Requires verbal cues;Requires tactile cues General Comments: Pt following one step commands with increased time. Pt resistilng rolling/bed mobility and intermittently resisted with LE ex's this session      Exercises General Exercises - Lower Extremity Ankle Circles/Pumps: AAROM;Both;10 reps;Supine Heel Slides: AAROM;Both;10 reps;Supine Hip ABduction/ADduction: AAROM;Strengthening;Both;10 reps;Supine Straight Leg Raises: AAROM;Strengthening;Both;10 reps;Supine     Pertinent Vitals/Pain Pain Assessment: Faces Faces Pain Scale: No hurt     PT Goals (current goals can now be found in the care plan section) Acute Rehab PT Goals Patient Stated Goal: none stated PT Goal Formulation: With patient/family Time For Goal Achievement: 02/09/21 Potential to Achieve Goals: Good Progress towards PT goals: Progressing toward goals (limited progress)    Frequency    Min 4X/week      PT Plan Current plan remains appropriate    AM-PAC PT "6 Clicks"  Mobility   Outcome Measure  Help needed turning from your back  to your side while in a flat bed without using bedrails?: Total Help needed moving from lying on your back to sitting on the side of a flat bed without using bedrails?: Total Help needed moving to and from a bed to a chair (including a wheelchair)?: Total Help needed standing up from a chair using your arms (e.g., wheelchair or bedside chair)?: Total Help needed to walk in hospital room?: Total Help needed climbing 3-5 steps with a railing? : Total 6 Click Score: 6    End of Session Equipment Utilized During Treatment: Oxygen Activity Tolerance: Patient tolerated treatment well Patient left: in bed;with call bell/phone within reach;with bed alarm set Nurse Communication: Mobility status PT Visit Diagnosis: Unsteadiness on feet (R26.81);Other abnormalities of gait and mobility (R26.89);Muscle weakness (generalized) (M62.81);Difficulty in walking, not elsewhere classified (R26.2);Other symptoms and signs involving the nervous system (R29.898);Hemiplegia and hemiparesis Hemiplegia - Right/Left: Left Hemiplegia - caused by: Cerebral infarction     Time: 3559-7416 PT Time Calculation (min) (ACUTE ONLY): 27 min  Charges:  $Therapeutic Exercise: 8-22 mins $Therapeutic Activity: 8-22 mins                    Sallyanne Kuster, PTA, [facility] Acute Rehab Services Office- [phone] 02/05/21, 1:58 PM   Sallyanne Kuster 02/05/2021, 1:57 PM

## 2021-02-05 NOTE — Progress Notes (Signed)
STROKE TEAM PROGRESS NOTE   INTERVAL HISTORY No family at bedside. Pt overnight no acute event. Neuro unchanged, on 4L O2 and respiratory also stable with some improvement. Less SOB. Na continues to elevate, today 156  Vitals:   02/04/21 2310 02/05/21 0350 02/05/21 0807 02/05/21 1112  BP: 128/71 (!) 154/76 (!) 144/78 128/67  Pulse: 73 72 79 75  Resp:  (!) 22 (!) 26 (!) 21  Temp: 98.2 F (36.8 C) 98.5 F (36.9 C) 98.8 F (37.1 C) 98.3 F (36.8 C)  TempSrc: Axillary Axillary Axillary Axillary  SpO2: 97% 91% 94%   Weight:      Height:       CBC:  Recent Labs  Lab 02/04/21 0300 02/05/21 0346  WBC 5.4 4.2  NEUTROABS 4.5 3.4  HGB 9.9* 9.9*  HCT 32.3* 31.7*  MCV 96.1 93.8  PLT 423* 413*   Basic Metabolic Panel:  Recent Labs  Lab 02/04/21 0300 02/05/21 0346  NA 154* 156*  K 3.8 4.1  CL 120* 125*  CO2 22 22  GLUCOSE 159* 169*  BUN 33* 33*  CREATININE 0.86 0.93  CALCIUM 8.6* 8.5*  MG 2.9* 2.8*    IMAGING   CT head Result Date: 01/27/2021 Impression Expected evolution of right MCA territory infarct with unchanged  multifocal intraparenchymal and subarachnoid blood. No worsened midline shift.  MRA head/MRI brain Result Date: 01/26/2021 IMPRESSION: 1. 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 No hemorrhage or mass effect. 2. Punctate foci of acute ischemia in the right temporal lobe and right thalamus. 3. Normal intracranial MRA.  DG CHEST PORT 1 VIEW Result Date: 01/25/2021 IMPRESSION:  1.  Endotracheal tube and NG tube in good anatomic position.  2.  Cardiomegaly.  No pulmonary venous congestion.  3.  Low lung volumes with bibasilar atelectasis.   DG CHEST PORT 1 VIEW 11/05/2020 Increasing bilateral opacities, particularly in the right upper lobe and throughout the left hemithorax. Could reflect worsening multifocal infection and/or edema. Stable cardiomegaly. Feeding tube tip near the  gastric antrum/duodenal bulb.   CT CEREBRAL PERFUSION W CONTRAST Result Date: 01/24/2021 IMPRESSION:  1. 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  2. 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  3. 68 mL right MCA territory core infarct with 133 mL area of ischemic penumbra.   CT HEAD CODE STROKE WO CONTRAST Result Date: 01/24/2021 IMPRESSION: 1. 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 2. Hyperdense appearance of the right MCA. 3. ASPECTS is 7.   CT ANGIO HEAD/NECK CODE STROKE Result Date: 01/24/2021 IMPRESSION:  1. 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  2. 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 3. 68 mL right MCA territory core infarct with 133 mL area of ischemic penumbra.  PHYSICAL EXAM  Temp:  [97.8 F (36.6 C)-98.8 F (37.1 C)] 98.3 F (36.8 C) (05/02 1112) Pulse Rate:  [72-84] 75 (05/02 1112) Resp:  [21-26] 21 (05/02 1112) BP: (128-154)/(67-81) 128/67 (05/02 1112) SpO2:  [91 %-97 %] 94 % (05/02 0807)  General - Well nourished, well developed, still has increased WOB.  Ophthalmologic - fundi not visualized due to noncooperation.  Cardiovascular - Regular rhythm and rate.  Neuro - awake, alert, eyes open, severe dysarthric with intangible  speech. No spontaneous speech, but following limited simple commands. Not able to name but seems able to repeat 3-word sentences in severe dysarthric voice. Right gaze preference and left gaze incomplete, tracking bilaterally, visual field blinking to visual threat on the right but not on the left, PERRL. Left facial droop. Tongue protrusion not cooperative. LUE 2-/5 proximal but 0/5 distal. RUE and bilateral lower extremity actively moving against gravity. Sensation, coordination not cooperative  and gait not tested.   ASSESSMENT/PLAN [name] is a 65 y.o. male with no significant PMH presenting with left hemiplegia, left facial droop, and dysarthria.  Was found to have an acute right MCA territory infarct s/p tPA and bilateral common carotid arteriograms followed by right MCA/ACA/ICA thrombus retrieval with stent angioplasty..   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 Per Dr. 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   Code Stroke Acute right MCA territory infarct ASPECTS 7   CTA head & neck: Complete occlusion of the R ICA and R MCA   CT perfusion 68 mL right MCA territory core infarct with 133 mL area of ischemic penumbra.  S/p IR with R MCA TICI3 and R carotid stenting.  MRI: large acute infarct of the right MCA territory involving the frontal operculum, insula and basal ganglia with hemorrhagic conversion   MRA R MCA patent  Right carotid duplex: ICA stent appears patent  Repeat CT head (4/23): evolution of right MCA infarct. Overall unchanged   2D Echo: EF 60-65%, mild LVH  Consider 30-day cardiac event monitor as outpatient to rule out A. Fib.  LDL 126  HgbA1c 5.8  UDS negative  VTE prophylaxis - SCDs  Diet: NPO, Tube feeding, swallow not progressing. Consider PEG placement   aspirin 81 mg daily prior to admission, now on aspirin 81 mg daily and Brilinta (ticagrelor) 90 mg bid.   Therapy recommendations:  CIR vs SNF   Disposition: pending  Carotid occlusion  CTA neck: Complete occlusion of the R ICA   S/p IR with R carotid stenting.  Per Dr. 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  Right carotid duplex: ICA stent appears patent  On aspirin and Brilinta DAPT.  Follow up with Dr. Corliss [name] as outpt  COVID Fever Respiratory distress  Pt is unvaccinated for COVID  SARS - negative on 01/24/21  SARS - positive on 02/03/21  CXR worsening multifocal pneumonia  On [place]  6L->4L  On remdesivir, Actemra, and steroids  On cefepime->unasyn  WBCs - 15.5->13.0->5.4->4.2  Hypernatremia   Na 143->145->148->152->154->156  Likely due to fever and respiratory water loss  On TF and FW  Increase FW 200 Q4 -> 400 Q4  Hypertension  Home meds:  none . SBP goal now < 160 given hemorrhagic conversion . Long-term BP goal normotensive . Continue Norvasc and add labetalol.  Hyperlipidemia  Home meds:  none  LDL 126, goal < 70  Add Lipitor 80g daily  Continue statin at discharge  Dysphagia  Secondary to stroke  NPO  SLP on board  [place] placed on TF @ 50cc  On FW 400 Q4h  Other Stroke Risk Factors  Advanced Age >/= 65    Other Active Problems  Code status - currently full code  Hypokalemia: -> now 3.7->3.8  Hypermagnesemia - Magnesium - 2.9 (1.7 - 2.4)  Hospital day #11  No additional recommendation now. Neurology will sign off. Please feel free to call with further questions.  Pt will follow up with stroke clinic Dr. Pearlean Brownie at [facility] in about 4 weeks. Thanks for the consult.   Marvel Plan, MD PhD Stroke Neurology 02/05/2021 3:41 PM   To contact Stroke Continuity provider, please refer to [email or web address]. After hours, contact General Neurology

## 2021-02-05 NOTE — Progress Notes (Signed)
PROGRESS NOTE  Leonard [name] ZOX:[id number] DOB: 04-Dec-1955 DOA: 01/24/2021 PCP: Pcp, No   LOS: 11 days   Brief Narrative / Interim history: 65 year old male with no significant history came into the hospital with left-sided weakness, left facial droop and dysarthria.  He was found to have an acute right MCA territory infarct status post tPA and bilateral common carotid arteriograms followed by right MCA/ACA/ICA thrombus retrieval with stent angioplasty.  He was admitted in the neuro ICU initially.  Hospitalist service was consulted on 4/29 due to increased work of breathing  Subjective / 24h Interval events: Breathing better.  Slightly more alert today than yesterday.  Follows commands and answers some questions with one-word  Assessment & Plan: Principal Problem Acute hypoxic respiratory failure due to Covid 19 pneumonia, possible aspiration pneumonia with sepsis physiology -patient has been having increased work of breathing for few days per wife but on 4/29 much more so and hospitalist service was consulted.  He was placed on antibiotics due to concern for aspiration given poor mental status. -Chest x-ray with developing multifocal pneumonia.  COVID was sent 4/30 and returned positive -Status post Actemra on 4/30.  Respiratory status seems to improve.  On 4 L nasal cannula -Started on remdesivir and steroids.  Continue -Procalcitonin relatively low, but cannot fully rule out ongoing aspiration.  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  His MRI on admission showed large acute infarct of the right MCA territory involving the frontal operculum, insula and basal ganglia with hemorrhagic conversion.  A 2D echo showed EF 60-65%, mild LVH.  LDL is 126.  A1c is 5.8.  He is currently on aspirin and Brilinta.  Has a core track for feeding and family agrees to PEG but will need to get over Covid first. -Repeat CT scan stable  Cerebral edema -goal sodium 145-150.  Remains hypernatremic, likely due to losses in the setting of acute illness.  Start D5W today, continue free water per tube  Essential hypertension-continue amlodipine, labetalol  Hyperlipidemia-continue statin  Dysphagia-due to stroke, has a core track, continue tube feeds.  Family wants PEG  Scheduled Meds: . albuterol  2 puff Inhalation QID  . amLODipine  10 mg Per Tube Daily  . vitamin C  500 mg Oral Daily  . aspirin  81 mg Oral Daily   Or  . aspirin  81 mg Per Tube Daily  . atorvastatin  80 mg Per Tube Daily  . chlorhexidine  15 mL Mouth Rinse BID  . Chlorhexidine Gluconate Cloth  6 each Topical Daily  . feeding supplement (PROSource TF)  45 mL Per Tube TID  . free water  400 mL Per Tube Q4H  . labetalol  200 mg Per Tube BID  . mouth rinse  15 mL Mouth Rinse q12n4p  . methylPREDNISolone (SOLU-MEDROL) injection  0.5 mg/kg Intravenous Q12H   Followed by  . [START ON 02/06/2021] predniSONE  50 mg Oral Daily  . senna-docusate  1 tablet Per Tube Daily  . ticagrelor  90 mg Oral BID   Or  .  ticagrelor  90 mg Per Tube BID  . zinc sulfate  220 mg Oral Daily   Continuous Infusions: . ampicillin-sulbactam (UNASYN) IV 3 g (02/05/21 0452)  . dextrose    . feeding supplement (OSMOLITE 1.5 CAL) 1,000 mL (02/04/21 0642)  . remdesivir 100 mg in NS 100 mL 100 mg (02/04/21 0958)   PRN Meds:.acetaminophen **OR** acetaminophen (TYLENOL) oral liquid 160 mg/5 mL **OR** acetaminophen, labetalol  Diet Orders (From admission, onward)    Start     Ordered   01/25/21  0300  Diet NPO time specified  Diet effective now        01/25/21 0259          DVT prophylaxis: SCD's Start: 01/25/21 0006     Code Status: Full Code  Family Communication: Discussed with wife over the phone  Status is: Inpatient  Remains inpatient appropriate because:Inpatient level of care appropriate due to severity of illness   Dispo: The patient is from: Home              Anticipated d/c is to: CIR              Patient currently is not medically stable to d/c.   Difficult to place patient No   Level of care: Progressive  Consultants:  Neurology   Procedures:  2D echo  Microbiology  1/4 bottles-staph epi blood cultures 4/28 SARS-CoV-2 + on 4/30  Antimicrobials: Cefepime 4/30 >> 5/1 Unasyn 5/1 >> Remdesivir 4/30 >>  Objective: Vitals:   02/04/21 2200 02/04/21 2310 02/05/21 0350 02/05/21 0807  BP:  128/71 (!) 154/76 (!) 144/78  Pulse: 74 73 72 79  Resp:   (!) 22 (!) 26  Temp:  98.2 F (36.8 C) 98.5 F (36.9 C) 98.8 F (37.1 C)  TempSrc:  Axillary Axillary Axillary  SpO2: 92% 97% 91% 94%  Weight:      Height:        Intake/Output Summary (Last 24 hours) at 02/05/2021 0911 Last data filed at 02/05/2021 0551 Gross per 24 hour  Intake 700 ml  Output 1100 ml  Net -400 ml   Filed Weights   01/30/21 0532 01/31/21 0400 02/03/21 0500  Weight: 79.7 kg 79.7 kg 79 kg    Examination:  Constitutional: More comfortable Eyes: No icterus ENMT: Dry mucous membranes Neck: normal, supple Respiratory: Coarse breath sounds bilaterally, improved work of breathing Cardiovascular: Regular rate and rhythm, no edema Abdomen: Nondistended, no tenderness, bowel sounds positive Musculoskeletal: no clubbing / cyanosis.  Skin: No rashes seen Neurologic: Follows commands, mild left upper extremity weakness  Data Reviewed: I have independently reviewed following labs and imaging studies   CBC: Recent Labs  Lab 01/29/21 1503 01/30/21 0820 02/01/21 0350  02/02/21 0108 02/03/21 0259 02/04/21 0300 02/05/21 0346  WBC 11.2* 12.2* 15.5* 15.0* 13.0* 5.4 4.2  NEUTROABS 8.9* 9.5* 12.1*  --   --  4.5 3.4  HGB 10.9* 11.6* 10.8* 11.0* 9.7* 9.9* 9.9*  HCT 33.0* 35.7* 33.7* 35.2* 30.7* 32.3* 31.7*  MCV 92.2 93.2 93.4 93.6 93.9 96.1 93.8  PLT 268 295 366 370 369 423* 413*   Basic Metabolic Panel: Recent Labs  Lab 02/01/21 0350 02/02/21 0108 02/03/21 0259 02/04/21 0300 02/05/21 0346  NA 145 148* 152* 154* 156*  K 3.6 4.0 3.7 3.8 4.1  CL 113* 116* 120* 120* 125*  CO2 21* 23 21* 22 22  GLUCOSE 134* 136* 177* 159* 169*  BUN 25* 27* 32* 33* 33*  CREATININE 0.72 0.84 0.95 0.86  0.93  CALCIUM 8.6* 8.6* 8.3* 8.6* 8.5*  MG  --   --   --  2.9* 2.8*   Liver Function Tests: Recent Labs  Lab 01/30/21 0820 02/04/21 0300 02/05/21 0346  AST 43* 72* 93*  ALT 47* 71* 95*  ALKPHOS 78 168* 146*  BILITOT 0.8 1.4* 1.0  PROT 6.4* 6.6 6.3*  ALBUMIN 3.2* 2.5* 2.4*   Coagulation Profile: No results for input(s): INR, PROTIME in the last 168 hours. HbA1C: No results for input(s): HGBA1C in the last 72 hours. CBG: Recent Labs  Lab 02/04/21 1609 02/04/21 1954 02/04/21 2306 02/05/21 0348 02/05/21 0804  GLUCAP 114* 118* 108* 150* 123*    Recent Results (from the past 240 hour(s))  Culture, Urine     Status: None   Collection Time: 02/01/21  2:06 PM   Specimen: Urine, Random  Result Value Ref Range Status   Specimen Description URINE, RANDOM  Final   Special Requests NONE  Final   Culture   Final    NO GROWTH Performed at [facility], [address] [place], [place] [address]    Report Status 02/02/2021 FINAL  Final  Culture, blood (routine x 2)     Status: None (Preliminary result)   Collection Time: 02/01/21  5:17 PM   Specimen: BLOOD RIGHT ARM  Result Value Ref Range Status   Specimen Description BLOOD RIGHT ARM  Final   Special Requests   Final    BOTTLES DRAWN AEROBIC ONLY Blood Culture results Eisenmenger not be optimal due to an  inadequate volume of blood received in culture bottles   Culture   Final    NO GROWTH 4 DAYS Performed at [facility], [address], [place], [place] [address]    Report Status PENDING  Incomplete  Culture, blood (routine x 2)     Status: Abnormal   Collection Time: 02/01/21  5:17 PM   Specimen: BLOOD LEFT HAND  Result Value Ref Range Status   Specimen Description BLOOD LEFT HAND  Final   Special Requests   Final    BOTTLES DRAWN AEROBIC ONLY Blood Culture results S[place] not be optimal due to an inadequate volume of blood received in culture bottles   Culture  Setup Time   Final    GRAM POSITIVE COCCI IN CLUSTERS AEROBIC BOTTLE ONLY CRITICAL RESULT CALLED TO, READ BACK BY AND VERIFIED WITH: J CARNEY [facility] 02/02/21 2137 JDW    Culture (A)  Final    STAPHYLOCOCCUS EPIDERMIDIS STAPHYLOCOCCUS CAPITIS THE SIGNIFICANCE OF ISOLATING THIS ORGANISM FROM A SINGLE SET OF BLOOD CULTURES WHEN MULTIPLE SETS ARE DRAWN IS UNCERTAIN. PLEASE NOTIFY THE MICROBIOLOGY DEPARTMENT WITHIN ONE WEEK IF SPECIATION AND SENSITIVITIES ARE REQUIRED. Performed at [facility], [address], [place], [place] [address]    Report Status 02/05/2021 FINAL  Final  Blood Culture ID Panel (Reflexed)     Status: Abnormal   Collection Time: 02/01/21  5:17 PM  Result Value Ref Range Status   Enterococcus faecalis NOT DETECTED NOT DETECTED Final   Enterococcus Faecium NOT DETECTED NOT DETECTED Final   Listeria monocytogenes NOT DETECTED NOT DETECTED Final   Staphylococcus species DETECTED (A) NOT DETECTED Final    Comment: CRITICAL RESULT CALLED TO, READ BACK BY AND VERIFIED WITH: J CARNEY PHAR[facility] 02/02/21 2137 JDW    Staphylococcus aureus (BCID) NOT DETECTED NOT DETECTED Final   Staphylococcus epidermidis DETECTED (A) NOT DETECTED Final    Comment: Methicillin (oxacillin) resistant coagulase negative  staphylococcus. Possible blood culture contaminant (unless isolated from more than one blood culture draw or  clinical case suggests pathogenicity). No antibiotic treatment is indicated for blood  culture contaminants. CRITICAL RESULT CALLED TO, READ BACK BY AND VERIFIED WITH: J CARNEY PHARMD 02/02/21 2137 JDW    Staphylococcus lugdunensis NOT DETECTED NOT DETECTED Final   Streptococcus species NOT DETECTED NOT DETECTED Final   Streptococcus agalactiae NOT DETECTED NOT DETECTED Final   Streptococcus pneumoniae NOT DETECTED NOT DETECTED Final   Streptococcus pyogenes NOT DETECTED NOT DETECTED Final   A.calcoaceticus-baumannii NOT DETECTED NOT DETECTED Final   Bacteroides fragilis NOT DETECTED NOT DETECTED Final   Enterobacterales NOT DETECTED NOT DETECTED Final   Enterobacter cloacae complex NOT DETECTED NOT DETECTED Final   Escherichia coli NOT DETECTED NOT DETECTED Final   Klebsiella aerogenes NOT DETECTED NOT DETECTED Final   Klebsiella oxytoca NOT DETECTED NOT DETECTED Final   Klebsiella pneumoniae NOT DETECTED NOT DETECTED Final   Proteus species NOT DETECTED NOT DETECTED Final   Salmonella species NOT DETECTED NOT DETECTED Final   Serratia marcescens NOT DETECTED NOT DETECTED Final   Haemophilus influenzae NOT DETECTED NOT DETECTED Final   Neisseria meningitidis NOT DETECTED NOT DETECTED Final   Pseudomonas aeruginosa NOT DETECTED NOT DETECTED Final   Stenotrophomonas maltophilia NOT DETECTED NOT DETECTED Final   Candida albicans NOT DETECTED NOT DETECTED Final   Candida auris NOT DETECTED NOT DETECTED Final   Candida glabrata NOT DETECTED NOT DETECTED Final   Candida krusei NOT DETECTED NOT DETECTED Final   Candida parapsilosis NOT DETECTED NOT DETECTED Final   Candida tropicalis NOT DETECTED NOT DETECTED Final   Cryptococcus neoformans/gattii NOT DETECTED NOT DETECTED Final   Methicillin resistance mecA/C DETECTED (A) NOT DETECTED Final    Comment: CRITICAL RESULT CALLED TO, READ BACK BY AND VERIFIED WITH: Arnaldo NatalJ CARNEY [facility] 02/02/21 2137 JDW Performed at [facility], [address] [place], [place] [address]   MRSA PCR Screening     Status: None   Collection Time: 02/02/21 11:30 PM   Specimen: Nasal Mucosa; Nasopharyngeal  Result Value Ref Range Status   MRSA by PCR NEGATIVE NEGATIVE Final    Comment:        The GeneXpert MRSA Assay (FDA approved for NASAL specimens only), is one component of a comprehensive MRSA colonization surveillance program. It is not intended to diagnose MRSA infection nor to guide or monitor treatment for MRSA infections. Performed at [facility], [address] [place], [place] [address]   Resp Panel by RT-PCR (Flu A&B, Covid) Nasopharyngeal Swab     Status: Abnormal   Collection Time: 02/03/21  9:38 AM   Specimen: Nasopharyngeal Swab; Nasopharyngeal(NP) swabs in vial transport medium  Result Value Ref Range Status   SARS Coronavirus 2 by RT PCR POSITIVE (A) NEGATIVE Final    Comment: RESULT CALLED TO, READ BACK BY AND VERIFIED WITH: A,PENNINGTON RN @1046  02/03/21 EB (NOTE) SARS-CoV-2 target nucleic acids are DETECTED.  The SARS-CoV-2 RNA is generally detectable in upper respiratory specimens during the acute phase of infection. Positive results are indicative of the presence of the identified virus, but do not rule out bacterial infection or co-infection with other pathogens not detected by the test. Clinical correlation with patient history and other diagnostic information is necessary to determine patient infection status. The expected result is Negative.  Fact Sheet for Patients: BloggerCourse.comhttps://www.fda.gov/media/152166/download  Fact Sheet for Healthcare Providers: SeriousBroker.ithttps://www.fda.gov/media/152162/download  This test is not yet approved or cleared by  the [employer] and  has been authorized for detection and/or diagnosis of SARS-CoV-2 by FDA under an Emergency Use Authorization (EUA).  This EUA will remain in effect (meaning this test can be  used) for the duration of  the COVID-19 declaration under Section  564(b)(1) of the Act, 21 U.S.C. section 360bbb-3(b)(1), unless the authorization is terminated or revoked sooner.     Influenza A by PCR NEGATIVE NEGATIVE Final   Influenza B by PCR NEGATIVE NEGATIVE Final    Comment: (NOTE) The Xpert Xpress SARS-CoV-2/FLU/RSV plus assay is intended as an aid in the diagnosis of influenza from Nasopharyngeal swab specimens and should not be used as a sole basis for treatment. Nasal washings and aspirates are unacceptable for Xpert Xpress SARS-CoV-2/FLU/RSV testing.  Fact Sheet for Patients: BloggerCourse.com  Fact Sheet for Healthcare Providers: SeriousBroker.it  This test is not yet approved or cleared by the [place] FDA and has been authorized for detection and/or diagnosis of SARS-CoV-2 by FDA under an Emergency Use Authorization (EUA). This EUA will remain in effect (meaning this test can be used) for the duration of the COVID-19 declaration under Section 564(b)(1) of the Act, 21 U.S.C. section 360bbb-3(b)(1), unless the authorization is terminated or revoked.  Performed at [facility], [address] [place], [place] [address]      Radiology Studies: CT HEAD WO CONTRAST  Result Date: 02/04/2021 CLINICAL DATA:  Follow-up stroke. Right MCA territory stroke in late April. EXAM: CT HEAD WITHOUT CONTRAST TECHNIQUE: Contiguous axial images were obtained from the base of the skull through the vertex without intravenous contrast. COMPARISON:  01/27/2021 FINDINGS: Brain: No abnormality affects the brainstem or cerebellum. Left cerebral hemisphere again appears normal. Subacute infarction in the right MCA territory affecting the insula and frontal operculum. Less swelling. Small areas of hemorrhage within the stroke are becoming less dense. No evidence of new or increasing hemorrhage. Right-to-left shift now only 1 or 2 mm. No new ischemic insult. No hydrocephalus or extra-axial collection.  Vascular: No abnormal vascular finding. Skull: Normal Sinuses/Orbits: Clear/normal. Other: None IMPRESSION: Expected evolutionary changes of infarction in the right middle cerebral artery territory which was acute in a parole. Low-density persists but with diminished swelling. Areas of hemorrhage within the substance of the infarction are becoming less dense. No new or increasing hemorrhage. Diminishing right to left midline shift, only about 2 mm presently. Electronically Signed   By: Paulina Fusi M.D.   On: 02/04/2021 16:01    Pamella Pert, MD, PhD [facility]  Between 7 am - 7 pm I am available, please contact me via Amion (for emergencies) or Securechat (non urgent messages)  Between 7 pm - 7 am I am not available, please contact night coverage MD/APP via Amion

## 2021-02-05 NOTE — Progress Notes (Signed)
Inpatient Rehab Admissions Coordinator:   Note pt tested positive for covid on 4/30.  Patients are eligible to be considered for admit to the [facility] when cleared from airborne precautions by Acute MD, or Infectious Disease MD.  Otherwise, they will need to be >20 days from their positive test with recovery/improvement in symptoms or 2 negative tests.  Will follow from a distance for now.   Estill Dooms, PT, DPT Admissions Coordinator [phone] 02/05/21  9:17 AM

## 2021-02-05 NOTE — Plan of Care (Signed)
  Problem: Safety: Goal: Non-violent Restraint(s) Outcome: Progressing   Problem: Education: Goal: Knowledge of disease or condition will improve Outcome: Progressing Goal: Knowledge of secondary prevention will improve Outcome: Progressing Goal: Knowledge of patient specific risk factors addressed and post discharge goals established will improve Outcome: Progressing Goal: Individualized Educational Video(s) Outcome: Progressing   Problem: Education: Goal: Knowledge of General Education information will improve Description: Including pain rating scale, medication(s)/side effects and non-pharmacologic comfort measures Outcome: Progressing   Problem: Health Behavior/Discharge Planning: Goal: Ability to manage health-related needs will improve Outcome: Progressing   Problem: Clinical Measurements: Goal: Ability to maintain clinical measurements within normal limits will improve Outcome: Progressing Goal: Will remain free from infection Outcome: Progressing Goal: Diagnostic test results will improve Outcome: Progressing Goal: Respiratory complications will improve Outcome: Progressing Goal: Cardiovascular complication will be avoided Outcome: Progressing   Problem: Activity: Goal: Risk for activity intolerance will decrease Outcome: Progressing   Problem: Nutrition: Goal: Adequate nutrition will be maintained Outcome: Progressing   Problem: Coping: Goal: Level of anxiety will decrease Outcome: Progressing   Problem: Elimination: Goal: Will not experience complications related to bowel motility Outcome: Progressing Goal: Will not experience complications related to urinary retention Outcome: Progressing   Problem: Pain Managment: Goal: General experience of comfort will improve Outcome: Progressing   Problem: Safety: Goal: Ability to remain free from injury will improve Outcome: Progressing   Problem: Skin Integrity: Goal: Risk for impaired skin integrity  will decrease Outcome: Progressing   Problem: Safety: Goal: Non-violent Restraint(s) Outcome: Progressing

## 2021-02-06 DIAGNOSIS — I10 Essential (primary) hypertension: Secondary | ICD-10-CM | POA: Diagnosis not present

## 2021-02-06 DIAGNOSIS — D72829 Elevated white blood cell count, unspecified: Secondary | ICD-10-CM | POA: Diagnosis not present

## 2021-02-06 DIAGNOSIS — R509 Fever, unspecified: Secondary | ICD-10-CM

## 2021-02-06 LAB — CBC WITH DIFFERENTIAL/PLATELET
Abs Immature Granulocytes: 0.04 10*3/uL (ref 0.00–0.07)
Basophils Absolute: 0 10*3/uL (ref 0.0–0.1)
Basophils Relative: 0 %
Eosinophils Absolute: 0.6 10*3/uL — ABNORMAL HIGH (ref 0.0–0.5)
Eosinophils Relative: 12 %
HCT: 30.7 % — ABNORMAL LOW (ref 39.0–52.0)
Hemoglobin: 9.3 g/dL — ABNORMAL LOW (ref 13.0–17.0)
Immature Granulocytes: 1 %
Lymphocytes Relative: 27 %
Lymphs Abs: 1.3 10*3/uL (ref 0.7–4.0)
MCH: 29.2 pg (ref 26.0–34.0)
MCHC: 30.3 g/dL (ref 30.0–36.0)
MCV: 96.2 fL (ref 80.0–100.0)
Monocytes Absolute: 0.6 10*3/uL (ref 0.1–1.0)
Monocytes Relative: 13 %
Neutro Abs: 2.3 10*3/uL (ref 1.7–7.7)
Neutrophils Relative %: 47 %
Platelets: 392 10*3/uL (ref 150–400)
RBC: 3.19 MIL/uL — ABNORMAL LOW (ref 4.22–5.81)
RDW: 13 % (ref 11.5–15.5)
WBC: 4.8 10*3/uL (ref 4.0–10.5)
nRBC: 0.4 % — ABNORMAL HIGH (ref 0.0–0.2)

## 2021-02-06 LAB — COMPREHENSIVE METABOLIC PANEL
ALT: 107 U/L — ABNORMAL HIGH (ref 0–44)
AST: 88 U/L — ABNORMAL HIGH (ref 15–41)
Albumin: 2.3 g/dL — ABNORMAL LOW (ref 3.5–5.0)
Alkaline Phosphatase: 120 U/L (ref 38–126)
Anion gap: 6 (ref 5–15)
BUN: 27 mg/dL — ABNORMAL HIGH (ref 8–23)
CO2: 24 mmol/L (ref 22–32)
Calcium: 8 mg/dL — ABNORMAL LOW (ref 8.9–10.3)
Chloride: 122 mmol/L — ABNORMAL HIGH (ref 98–111)
Creatinine, Ser: 0.87 mg/dL (ref 0.61–1.24)
GFR, Estimated: >60 mL/min (ref >60)
Glucose, Bld: 160 mg/dL — ABNORMAL HIGH (ref 70–99)
Potassium: 3.6 mmol/L (ref 3.5–5.1)
Sodium: 152 mmol/L — ABNORMAL HIGH (ref 135–145)
Total Bilirubin: 1 mg/dL (ref 0.3–1.2)
Total Protein: 5.5 g/dL — ABNORMAL LOW (ref 6.5–8.1)

## 2021-02-06 LAB — GLUCOSE, CAPILLARY
Glucose-Capillary: 126 mg/dL — ABNORMAL HIGH (ref 70–99)
Glucose-Capillary: 136 mg/dL — ABNORMAL HIGH (ref 70–99)
Glucose-Capillary: 134 mg/dL — ABNORMAL HIGH (ref 70–99)
Glucose-Capillary: 151 mg/dL — ABNORMAL HIGH (ref 70–99)
Glucose-Capillary: 169 mg/dL — ABNORMAL HIGH (ref 70–99)

## 2021-02-06 LAB — C-REACTIVE PROTEIN: CRP: 5.4 mg/dL — ABNORMAL HIGH (ref <1.0)

## 2021-02-06 LAB — CULTURE, BLOOD (ROUTINE X 2): Culture: NO GROWTH

## 2021-02-06 LAB — MAGNESIUM: Magnesium: 2.7 mg/dL — ABNORMAL HIGH (ref 1.7–2.4)

## 2021-02-06 LAB — D-DIMER, QUANTITATIVE: D-Dimer, Quant: 2.39 ug/mL-FEU — ABNORMAL HIGH (ref 0.00–0.50)

## 2021-02-06 MED ORDER — METHYLPREDNISOLONE SODIUM SUCC 125 MG IJ SOLR
INTRAMUSCULAR | Status: AC
  Filled 2021-02-06: qty 2

## 2021-02-06 MED ORDER — DEXTROSE 5 % IV SOLN: INTRAVENOUS | Status: DC

## 2021-02-06 NOTE — Progress Notes (Addendum)
PROGRESS NOTE  Leonard [name] GUY:[id number] DOB: 1955-12-09 DOA: 01/24/2021 PCP: Pcp, No   LOS: 12 days   Brief Narrative / Interim history: 65 year old male with no significant history came into the hospital with left-sided weakness, left facial droop and dysarthria.  He was found to have an acute right MCA territory infarct status post tPA and bilateral common carotid arteriograms followed by right MCA/ACA/ICA thrombus retrieval with stent angioplasty.  He was admitted in the neuro ICU initially.  Hospitalist service was consulted on 4/29 due to increased work of breathing, he was diagnosed with Covid  Subjective / 24h Interval events: Physical therapy is in the room.  He seems alert, following commands but does not speak much.  He can tell me his name, has significant slurred speech  Assessment & Plan: Principal Problem Acute hypoxic respiratory failure due to Covid 19 pneumonia, possible aspiration pneumonia with sepsis physiology -patient has been having increased work of breathing for few days per wife but on 4/29 much more so and hospitalist service was consulted.  He was placed on antibiotics due to concern for aspiration given poor mental status. -Chest x-ray with developing multifocal pneumonia.  COVID was sent 4/30 and returned positive -Status post Actemra on 4/30, also placed on Remdesivir along with steroids.  His respiratory status seems to be improving, he is now stable on 4 L and will attempt to wean. -Procalcitonin relatively low, but cannot fully rule out ongoing aspiration.  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  His MRI on admission showed large acute infarct of the right MCA territory involving the frontal operculum, insula and basal ganglia with hemorrhagic conversion.  A 2D echo showed EF 60-65%, mild LVH.  LDL is 126.  A1c is 5.8.  He is currently on aspirin and Brilinta.  Has a core track for feeding and family agrees to PEG but will need to get over Covid first. -Repeat CT scan stable -PT recommends CIR, will need to be off isolation  Cerebral edema -goal sodium 145-150.  Remains hypernatremic, likely due to losses in the setting of acute illness.  Received D5W yesterday, sodium improved 156-152, continue D5W today  Essential hypertension-continue amlodipine, labetalol  Hyperlipidemia-continue statin  Dysphagia-due to stroke, has a core track, continue tube feeds.  Family wants PEG  Scheduled Meds: . amLODipine  10 mg Per Tube Daily  . vitamin C  500 mg Per Tube Daily  . aspirin  81 mg Oral Daily   Or  . aspirin  81 mg Per Tube Daily  . atorvastatin  80 mg Per Tube Daily  . chlorhexidine  15 mL Mouth Rinse BID  . Chlorhexidine Gluconate Cloth  6 each Topical Daily  . feeding supplement (PROSource TF)  45 mL Per Tube TID  . free water  400 mL Per Tube Q4H  . labetalol  200 mg Per Tube BID  . mouth rinse  15 mL Mouth Rinse q12n4p  . methylPREDNISolone sodium succinate      . predniSONE  50 mg Per Tube Daily  .  senna-docusate  1 tablet Per Tube Daily  . ticagrelor  90 mg Oral BID   Or  . ticagrelor  90 mg Per Tube BID  . zinc sulfate  220 mg Oral Daily   Continuous Infusions: . ampicillin-sulbactam (UNASYN) IV Stopped (02/06/21 0441)  . feeding supplement (OSMOLITE 1.5 CAL) 1,000 mL (02/04/21 0642)  . remdesivir 100 mg in NS 100 mL Stopped (02/05/21 1037)   PRN Meds:.acetaminophen **OR** acetaminophen (TYLENOL) oral liquid 160 mg/5 mL **OR**  acetaminophen, albuterol, labetalol  Diet Orders (From admission, onward)    Start     Ordered   01/25/21 0300  Diet NPO time specified  Diet effective now        01/25/21 0259          DVT prophylaxis: SCD's Start: 01/25/21 0006     Code Status: Full Code  Family Communication: Discussed with wife over the phone  Status is: Inpatient  Remains inpatient appropriate because:Inpatient level of care appropriate due to severity of illness   Dispo: The patient is from: Home              Anticipated d/c is to: CIR              Patient currently is not medically stable to d/c.   Difficult to place patient No   Level of care: Progressive  Consultants:  Neurology   Procedures:  2D echo  Microbiology  1/4 bottles-staph epi blood cultures 4/28 SARS-CoV-2 + on 4/30  Antimicrobials: Cefepime 4/30 >> 5/1 Unasyn 5/1 >> 5/3 Remdesivir 4/30 >>  Objective: Vitals:   02/06/21 0000 02/06/21 0400 02/06/21 0800 02/06/21 0853  BP: (!) 141/76 (!) 154/81  (!) (P) 165/84  Pulse: 84 80 73 (P) 73  Resp: 19 (!) 21 (!) 26 (!) (P) 26  Temp: 98.7 F (37.1 C) 98.3 F (36.8 C)  (P) 98.3 F (36.8 C)  TempSrc: Axillary Axillary  (P) Axillary  SpO2: 94% 94% 96%   Weight:      Height:        Intake/Output Summary (Last 24 hours) at 02/06/2021 0940 Last data filed at 02/06/2021 0700 Gross per 24 hour  Intake 4744.76 ml  Output 2020 ml  Net 2724.76 ml   Filed Weights   01/30/21 0532 01/31/21 0400 02/03/21 0500  Weight: 79.7 kg 79.7 kg 79 kg    Examination:  Constitutional: In bed, no apparent discomfort Eyes: No scleral icterus ENMT: Dry mucous membranes Neck: normal, supple Respiratory: Coarse breath sounds bilaterally, occasional tachypneic spells but moves air well and appears to have less work of breathing than yesterday Cardiovascular: Regular rate and rhythm, no peripheral edema Abdomen: Soft, nontender, nondistended, bowel sounds positive Musculoskeletal: no clubbing /  cyanosis.  Skin: No rashes appreciated Neurologic: Follows commands, mild left upper extremity weakness, no new focal deficits  Data Reviewed: I have independently reviewed following labs and imaging studies   CBC: Recent Labs  Lab 02/01/21 0350 02/02/21 0108 02/03/21 0259 02/04/21 0300 02/05/21 0346 02/06/21 0526  WBC 15.5* 15.0* 13.0* 5.4 4.2 4.8  NEUTROABS 12.1*  --   --  4.5 3.4 2.3  HGB 10.8* 11.0* 9.7* 9.9* 9.9* 9.3*  HCT 33.7* 35.2* 30.7* 32.3* 31.7* 30.7*  MCV 93.4 93.6 93.9 96.1 93.8 96.2  PLT 366 370 369 423* 413* 392   Basic Metabolic Panel: Recent Labs  Lab 02/02/21 0108 02/03/21 0259 02/04/21 0300 02/05/21 0346 02/06/21 0526  NA 148* 152* 154* 156* 152*  K 4.0 3.7 3.8  4.1 3.6  CL 116* 120* 120* 125* 122*  CO2 23 21* 22 22 24   GLUCOSE 136* 177* 159* 169* 160*  BUN 27* 32* 33* 33* 27*  CREATININE 0.84 0.95 0.86 0.93 0.87  CALCIUM 8.6* 8.3* 8.6* 8.5* 8.0*  MG  --   --  2.9* 2.8* 2.7*   Liver Function Tests: Recent Labs  Lab 02/04/21 0300 02/05/21 0346 02/06/21 0526  AST 72* 93* 88*  ALT 71* 95* 107*  ALKPHOS 168* 146* 120  BILITOT 1.4* 1.0 1.0  PROT 6.6 6.3* 5.5*  ALBUMIN 2.5* 2.4* 2.3*   Coagulation Profile: No results for input(s): INR, PROTIME in the last 168 hours. HbA1C: No results for input(s): HGBA1C in the last 72 hours. CBG: Recent Labs  Lab 02/05/21 1654 02/05/21 2116 02/05/21 2333 02/06/21 0444 02/06/21 0851  GLUCAP 133* 134* 127* 126* 136*    Recent Results (from the past 240 hour(s))  Culture, Urine     Status: None   Collection Time: 02/01/21  2:06 PM   Specimen: Urine, Random  Result Value Ref Range Status   Specimen Description URINE, RANDOM  Final   Special Requests NONE  Final   Culture   Final    NO GROWTH Performed at [facility], [address], [place], [place]    Report Status 02/02/2021 FINAL  Final  Culture, blood (routine x 2)     Status: None (Preliminary result)   Collection Time:  02/01/21  5:17 PM   Specimen: BLOOD RIGHT ARM  Result Value Ref Range Status   Specimen Description BLOOD RIGHT ARM  Final   Special Requests   Final    BOTTLES DRAWN AEROBIC ONLY Blood Culture results Drone not be optimal due to an inadequate volume of blood received in culture bottles   Culture   Final    NO GROWTH 4 DAYS Performed at [facility], [address], [place], [place]    Report Status PENDING  Incomplete  Culture, blood (routine x 2)     Status: Abnormal   Collection Time: 02/01/21  5:17 PM   Specimen: BLOOD LEFT HAND  Result Value Ref Range Status   Specimen Description BLOOD LEFT HAND  Final   Special Requests   Final    BOTTLES DRAWN AEROBIC ONLY Blood Culture results [place] not be optimal due to an inadequate volume of blood received in culture bottles   Culture  Setup Time   Final    GRAM POSITIVE COCCI IN CLUSTERS AEROBIC BOTTLE ONLY CRITICAL RESULT CALLED TO, READ BACK BY AND VERIFIED WITH: J CARNEY [name] 02/02/21 2137 JDW    Culture (A)  Final    STAPHYLOCOCCUS EPIDERMIDIS STAPHYLOCOCCUS CAPITIS THE SIGNIFICANCE OF ISOLATING THIS ORGANISM FROM A SINGLE SET OF BLOOD CULTURES WHEN MULTIPLE SETS ARE DRAWN IS UNCERTAIN. PLEASE NOTIFY THE MICROBIOLOGY DEPARTMENT WITHIN ONE WEEK IF SPECIATION AND SENSITIVITIES ARE REQUIRED. Performed at [facility], [address], [place], [place]    Report Status 02/05/2021 FINAL  Final  Blood Culture ID Panel (Reflexed)     Status: Abnormal   Collection Time: 02/01/21  5:17 PM  Result Value Ref Range Status   Enterococcus faecalis NOT DETECTED NOT DETECTED Final   Enterococcus Faecium NOT DETECTED NOT DETECTED Final   Listeria monocytogenes NOT DETECTED NOT DETECTED Final   Staphylococcus species DETECTED (A) NOT DETECTED Final    Comment: CRITICAL RESULT CALLED TO, READ BACK BY AND VERIFIED WITH: J  CARNEY PHARMD 02/02/21 2137 JDW    Staphylococcus aureus (BCID) NOT DETECTED NOT DETECTED Final    Staphylococcus epidermidis DETECTED (A) NOT DETECTED Final    Comment: Methicillin (oxacillin) resistant coagulase negative staphylococcus. Possible blood culture contaminant (unless isolated from more than one blood culture draw or clinical case suggests pathogenicity). No antibiotic treatment is indicated for blood  culture contaminants. CRITICAL RESULT CALLED TO, READ BACK BY AND VERIFIED WITH: J CARNEY PHARMD 02/02/21 2137 JDW    Staphylococcus lugdunensis NOT DETECTED NOT DETECTED Final   Streptococcus species NOT DETECTED NOT DETECTED Final   Streptococcus agalactiae NOT DETECTED NOT DETECTED Final   Streptococcus pneumoniae NOT DETECTED NOT DETECTED Final   Streptococcus pyogenes NOT DETECTED NOT DETECTED Final   A.calcoaceticus-baumannii NOT DETECTED NOT DETECTED Final   Bacteroides fragilis NOT DETECTED NOT DETECTED Final   Enterobacterales NOT DETECTED NOT DETECTED Final   Enterobacter cloacae complex NOT DETECTED NOT DETECTED Final   Escherichia coli NOT DETECTED NOT DETECTED Final   Klebsiella aerogenes NOT DETECTED NOT DETECTED Final   Klebsiella oxytoca NOT DETECTED NOT DETECTED Final   Klebsiella pneumoniae NOT DETECTED NOT DETECTED Final   Proteus species NOT DETECTED NOT DETECTED Final   Salmonella species NOT DETECTED NOT DETECTED Final   Serratia marcescens NOT DETECTED NOT DETECTED Final   Haemophilus influenzae NOT DETECTED NOT DETECTED Final   Neisseria meningitidis NOT DETECTED NOT DETECTED Final   Pseudomonas aeruginosa NOT DETECTED NOT DETECTED Final   Stenotrophomonas maltophilia NOT DETECTED NOT DETECTED Final   Candida albicans NOT DETECTED NOT DETECTED Final   Candida auris NOT DETECTED NOT DETECTED Final   Candida glabrata NOT DETECTED NOT DETECTED Final   Candida krusei NOT DETECTED NOT DETECTED Final   Candida parapsilosis NOT DETECTED NOT DETECTED Final   Candida tropicalis NOT DETECTED NOT DETECTED Final   Cryptococcus neoformans/gattii NOT DETECTED  NOT DETECTED Final   Methicillin resistance mecA/C DETECTED (A) NOT DETECTED Final    Comment: CRITICAL RESULT CALLED TO, READ BACK BY AND VERIFIED WITH: Arnaldo NatalJ CARNEY [facility] 02/02/21 2137 JDW Performed at [facility], [address] [place], [place] [address]   MRSA PCR Screening     Status: None   Collection Time: 02/02/21 11:30 PM   Specimen: Nasal Mucosa; Nasopharyngeal  Result Value Ref Range Status   MRSA by PCR NEGATIVE NEGATIVE Final    Comment:        The GeneXpert MRSA Assay (FDA approved for NASAL specimens only), is one component of a comprehensive MRSA colonization surveillance program. It is not intended to diagnose MRSA infection nor to guide or monitor treatment for MRSA infections. Performed at [facility], [address] [place], [place] [address]   Resp Panel by RT-PCR (Flu A&B, Covid) Nasopharyngeal Swab     Status: Abnormal   Collection Time: 02/03/21  9:38 AM   Specimen: Nasopharyngeal Swab; Nasopharyngeal(NP) swabs in vial transport medium  Result Value Ref Range Status   SARS Coronavirus 2 by RT PCR POSITIVE (A) NEGATIVE Final    Comment: RESULT CALLED TO, READ BACK BY AND VERIFIED WITH: A,PENNINGTON RN @1046  02/03/21 EB (NOTE) SARS-CoV-2 target nucleic acids are DETECTED.  The SARS-CoV-2 RNA is generally detectable in upper respiratory specimens during the acute phase of infection. Positive results are indicative of the presence of the identified virus, but do not rule out bacterial infection or co-infection with other pathogens not detected by the test. Clinical correlation with patient history and other diagnostic information is  necessary to determine patient infection status. The expected result is Negative.  Fact Sheet for Patients: BloggerCourse.com  Fact Sheet for Healthcare Providers: SeriousBroker.it  This test is not yet approved or cleared by the [place] FDA and  has been  authorized for detection and/or diagnosis of SARS-CoV-2 by FDA under an Emergency Use Authorization (EUA).  This EUA will remain in effect (meaning this test can be  used) for the duration of  the COVID-19 declaration under Section 564(b)(1) of the Act, 21 U.S.C. section 360bbb-3(b)(1), unless the authorization is terminated or revoked sooner.     Influenza A by PCR NEGATIVE NEGATIVE Final   Influenza B by PCR NEGATIVE NEGATIVE Final    Comment: (NOTE) The Xpert Xpress SARS-CoV-2/FLU/RSV plus assay is intended as an aid in the diagnosis of influenza from Nasopharyngeal swab specimens and should not be used as a sole basis for treatment. Nasal washings and aspirates are unacceptable for Xpert Xpress SARS-CoV-2/FLU/RSV testing.  Fact Sheet for Patients: BloggerCourse.com  Fact Sheet for Healthcare Providers: SeriousBroker.it  This test is not yet approved or cleared by the [place] FDA and has been authorized for detection and/or diagnosis of SARS-CoV-2 by FDA under an Emergency Use Authorization (EUA). This EUA will remain in effect (meaning this test can be used) for the duration of the COVID-19 declaration under Section 564(b)(1) of the Act, 21 U.S.C. section 360bbb-3(b)(1), unless the authorization is terminated or revoked.  Performed at [facility], [address] [place], [place] [address]      Radiology Studies: No results found.  Pamella Pert, MD, PhD [facility]  Between 7 am - 7 pm I am available, please contact me via Amion (for emergencies) or Securechat (non urgent messages)  Between 7 pm - 7 am I am not available, please contact night coverage MD/APP via Amion

## 2021-02-06 NOTE — Progress Notes (Signed)
Physical Therapy Treatment Patient Details Name: Leonard [name] MRN: [id number] DOB: 16-Oct-1955 Today's Date: 02/06/2021    History of Present Illness 65 y.o. male who presented 4/20 with acute onset L hemiplegia, L facial droop, dysarthria, and L hemineglect. tPA was administered. 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 S/p bil common carotid arteriograms and revascularization of occluded R MCA and R ACA prox and R ICA terminus. ETT 4/20-4/21. No significant PMH.    PT Comments    Pt awake upon entry of OT/PT, engaged throughout session until end when he became very fatigued. Pt came to EOB with mod A +2, following commands for initiation of LE's to EOB. Pt stood with min A +2 and maintained standing for bowel clean up. Mod A +2 to ambulate to chair due to very small, shuffling steps. COntinue to recommend CIR at d/c. PT will continue to follow.    Follow Up Recommendations  CIR;Supervision/Assistance - 24 hour     Equipment Recommendations  3in1 (PT);Wheelchair (measurements PT);Wheelchair cushion (measurements PT);Other (comment)    Recommendations for Other Services Rehab consult     Precautions / Restrictions Precautions Precautions: Fall Precaution Comments: L neglect; R wrist restraint. Covid precautions Restrictions Weight Bearing Restrictions: No    Mobility  Bed Mobility Overal bed mobility: Needs Assistance Bed Mobility: Rolling;Sidelying to Sit Rolling: Mod assist;+2 for physical assistance Sidelying to sit: Mod assist;+2 for physical assistance       General bed mobility comments: pt initiated rolling with lower body when cued, mod A to complete LE's off bed and mod A at upper body to guide LUE acrood body. Mod A +2 for elevation of turnk to  sitting position    Transfers Overall transfer level: Needs assistance Equipment used: 2 person hand held assist Transfers: Sit to/from Stand Sit to Stand: Min assist;+2 physical assistance;+2 safety/equipment Stand pivot transfers: +2 physical assistance;+2 safety/equipment;Mod assist       General transfer comment: Min A +2 for safety, balance, and power up. Mod A +2 for pivot due to very small, shuffling steps. WOrked on picking feet up and taking functional steps. Pt noticeably attempting to do this, successful 25% of time  Ambulation/Gait Ambulation/Gait assistance: +2 physical assistance;+2 safety/equipment;Mod assist Gait Distance (Feet): 3 Feet Assistive device: 2 person hand held assist Gait Pattern/deviations: Narrow base of support;Decreased stride length;Step-to pattern;Shuffle Gait velocity: decr Gait velocity interpretation: <1.31 ft/sec, indicative of household ambulator General Gait Details: HHA x2 working on step height and length. Planned to ambulate again but pt very tired once seated   Stairs             Wheelchair Mobility    Modified Rankin (Stroke Patients Only) Modified Rankin (Stroke Patients Only) Pre-Morbid Rankin Score: No symptoms Modified Rankin: Moderately severe disability     Balance Overall balance assessment: Needs assistance Sitting-balance support: Feet supported;No upper extremity supported Sitting balance-Leahy Scale: Poor Sitting balance - Comments: supervision for safety initially but then posterior lean with feet off floor, needed min-guard and vc's for fwd wt shift Postural control: Posterior lean Standing balance support: Single extremity supported;During functional activity;Bilateral upper extremity supported Standing balance-Leahy Scale: Poor Standing balance comment: +2 min assist with single UE support statically. Pt maintained standing for bowel clean up with min A  Cognition  Arousal/Alertness: Awake/alert Behavior During Therapy: Flat affect Overall Cognitive Status: Impaired/Different from baseline Area of Impairment: Memory;Attention;Following commands;Safety/judgement;Awareness;Problem solving;Orientation                 Orientation Level: Disoriented to;Time;Situation Current Attention Level: Sustained Memory: Decreased short-term memory Following Commands: Follows one step commands inconsistently;Follows one step commands with increased time Safety/Judgement: Decreased awareness of safety;Decreased awareness of deficits Awareness: Intellectual Problem Solving: Slow processing;Decreased initiation;Difficulty sequencing;Requires verbal cues;Requires tactile cues General Comments: pt able to verbalize name, otherwise only guttaral sounds during session. Follows simple commands with increased time. Engaged in session until end when he became fatigued      Exercises      General Comments General comments (skin integrity, edema, etc.): VSS on 4L O2. Worked on turning head to L and taking gaze past midline. Able to do this once in sitting.      Pertinent Vitals/Pain Pain Assessment: Faces Faces Pain Scale: No hurt    Home Living                      Prior Function            PT Goals (current goals can now be found in the care plan section) Acute Rehab PT Goals Patient Stated Goal: none stated PT Goal Formulation: With patient/family Time For Goal Achievement: 02/09/21 Potential to Achieve Goals: Good Progress towards PT goals: Progressing toward goals    Frequency    Min 4X/week      PT Plan Current plan remains appropriate    Co-evaluation PT/OT/SLP Co-Evaluation/Treatment: Yes Reason for Co-Treatment: Complexity of the patient's impairments (multi-system involvement);Necessary to address cognition/behavior during functional activity;For patient/therapist safety PT goals addressed during session: Mobility/safety with  mobility;Balance        AM-PAC PT "6 Clicks" Mobility   Outcome Measure  Help needed turning from your back to your side while in a flat bed without using bedrails?: Total Help needed moving from lying on your back to sitting on the side of a flat bed without using bedrails?: Total Help needed moving to and from a bed to a chair (including a wheelchair)?: Total Help needed standing up from a chair using your arms (e.g., wheelchair or bedside chair)?: Total Help needed to walk in hospital room?: Total Help needed climbing 3-5 steps with a railing? : Total 6 Click Score: 6    End of Session Equipment Utilized During Treatment: Oxygen;Gait belt Activity Tolerance: Patient tolerated treatment well Patient left: with call bell/phone within reach;in chair;with chair alarm set;with restraints reapplied Nurse Communication: Mobility status PT Visit Diagnosis: Unsteadiness on feet (R26.81);Other abnormalities of gait and mobility (R26.89);Muscle weakness (generalized) (M62.81);Difficulty in walking, not elsewhere classified (R26.2);Other symptoms and signs involving the nervous system (R29.898);Hemiplegia and hemiparesis Hemiplegia - Right/Left: Left Hemiplegia - caused by: Cerebral infarction     Time: 0852-0922 PT Time Calculation (min) (ACUTE ONLY): 30 min  Charges:  $Gait Training: 8-22 mins                     Lyanne Co, PT  Acute Rehab Services  Pager [phone] Office [phone]    Lawana Chambers Brisia Schuermann 02/06/2021, 9:36 AM

## 2021-02-06 NOTE — Progress Notes (Signed)
Occupational Therapy Treatment Patient Details Name: Leonard [name] MRN: [id number] DOB: [birth year] Today's Date: 02/06/2021    History of present illness 65 y.o. male who presented 4/20 with acute onset L hemiplegia, L facial droop, dysarthria, and L hemineglect. tPA was administered. 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 S/p bil common carotid arteriograms and revascularization of occluded R MCA and R ACA prox and R ICA terminus. ETT 4/20-4/21. No significant PMH.   OT comments  Patient supine in bed and engaged in OT/PT session.  Patient fatigues easily and lethargic throughout session.  Requires mod assist +2 for bed mobility and min assist +2 for transfers to recliner; incontinent of bowel in bed requiring total assist for hygiene but able to complete in standing with min assist of 1 during static standing.  Pt able to scan towards L side today with increased time and cueing.  Limited session due to fatigue once seated in recliner.  Will follow acutely, continue to recommend CIR.    Follow Up Recommendations  CIR;Supervision/Assistance - 24 hour    Equipment Recommendations  None recommended by OT    Recommendations for Other Services Rehab consult    Precautions / Restrictions Precautions Precautions: Fall Precaution Comments: L neglect; R mit. Covid precautions Restrictions Weight Bearing Restrictions: No       Mobility Bed Mobility Overal bed mobility: Needs Assistance Bed Mobility: Rolling;Sidelying to Sit Rolling: Mod assist;+2 for physical assistance Sidelying to sit: Mod assist;+2 for physical assistance Supine to sit: Mod assist;+2 for physical assistance;+2 for safety/equipment     General bed mobility comments: pt initiated rolling with lower  body when cued, mod A to complete LE's off bed and mod A at upper body to guide LUE acrood body. Mod A +2 for elevation of turnk to sitting position    Transfers Overall transfer level: Needs assistance Equipment used: 2 person hand held assist Transfers: Sit to/from Stand Sit to Stand: Min assist;+2 physical assistance;+2 safety/equipment Stand pivot transfers: +2 physical assistance;+2 safety/equipment;Mod assist       General transfer comment: Min A +2 for safety, balance, and power up.     Balance Overall balance assessment: Needs assistance Sitting-balance support: Feet supported;No upper extremity supported Sitting balance-Leahy Scale: Poor Sitting balance - Comments: supervision for safety initially but then posterior lean with feet off floor, needed min-guard and vc's for fwd wt shift Postural control: Posterior lean Standing balance support: Single extremity supported;During functional activity;Bilateral upper extremity supported Standing balance-Leahy Scale: Poor Standing balance comment: +2 min assist with single UE support statically. Pt maintained standing for bowel clean up with min A                           ADL either performed or assessed with clinical judgement   ADL Overall ADL's : Needs assistance/impaired                     Lower Body Dressing: Maximal assistance;+2 for physical assistance;+2 for safety/equipment;Sit to/from stand Lower Body Dressing Details (indicate cue type and reason): assist for socks, min assist +2 sit to stand Toilet Transfer: Minimal assistance;+2 for physical assistance;+2 for safety/equipment;Stand-pivot Toilet Transfer Details (indicate cue type and reason): simulated to recliner, min assist +2 Toileting- Clothing Manipulation  and Hygiene: Total assistance;+2 for safety/equipment;+2 for physical assistance;Sit to/from stand Toileting - Clothing Manipulation Details (indicate cue type and reason): incontinent  bowel in bed, standing for hygiene with total assist     Functional mobility during ADLs: Minimal assistance;+2 for physical assistance;+2 for safety/equipment General ADL Comments: pt fatigues easily     Vision   Additional Comments: increased cueing to track towards L side with increased time, continue assessment   Perception     Praxis      Cognition Arousal/Alertness: Awake/alert Behavior During Therapy: Flat affect Overall Cognitive Status: Impaired/Different from baseline Area of Impairment: Memory;Attention;Following commands;Safety/judgement;Awareness;Problem solving;Orientation                 Orientation Level: Disoriented to;Time;Situation Current Attention Level: Sustained Memory: Decreased short-term memory Following Commands: Follows one step commands inconsistently;Follows one step commands with increased time Safety/Judgement: Decreased awareness of safety;Decreased awareness of deficits Awareness: Intellectual Problem Solving: Slow processing;Decreased initiation;Difficulty sequencing;Requires verbal cues;Requires tactile cues General Comments: pt able to verbalize name, otherwise only guttaral sounds during session. Follows simple commands with increased time. Engaged in session until end when he became fatigued        Exercises     Shoulder Instructions       General Comments VSS on 4L O2. SpO2 maintained > 96%    Pertinent Vitals/ Pain       Pain Assessment: Faces Faces Pain Scale: No hurt Pain Intervention(s): Monitored during session  Home Living                                          Prior Functioning/Environment              Frequency  Min 2X/week        Progress Toward Goals  OT Goals(current goals can now be found in the care plan section)  Progress towards OT goals: Progressing toward goals (limited progress due to COVID dx and decreased tolerance, but improving)  Acute Rehab OT Goals Patient  Stated Goal: none stated  Plan Discharge plan remains appropriate;Frequency remains appropriate    Co-evaluation    PT/OT/SLP Co-Evaluation/Treatment: Yes Reason for Co-Treatment: Complexity of the patient's impairments (multi-system involvement);Necessary to address cognition/behavior during functional activity;For patient/therapist safety PT goals addressed during session: Mobility/safety with mobility;Balance OT goals addressed during session: ADL's and self-care      AM-PAC OT "6 Clicks" Daily Activity     Outcome Measure   Help from another person eating meals?: Total Help from another person taking care of personal grooming?: A Lot Help from another person toileting, which includes using toliet, bedpan, or urinal?: Total Help from another person bathing (including washing, rinsing, drying)?: A Lot Help from another person to put on and taking off regular upper body clothing?: A Lot Help from another person to put on and taking off regular lower body clothing?: A Lot 6 Click Score: 10    End of Session Equipment Utilized During Treatment: Gait belt  OT Visit Diagnosis: Unsteadiness on feet (R26.81);Cognitive communication deficit (R41.841) Symptoms and signs involving cognitive functions: Cerebral infarction   Activity Tolerance Patient tolerated treatment well   Patient Left in chair;with call bell/phone within reach;with chair alarm set;with restraints reapplied   Nurse Communication Mobility status        Time: 9509-3267 OT Time Calculation (min): 35 min  Charges: OT General Charges $OT Visit: 1 Visit  OT Treatments $Self Care/Home Management : 8-22 mins  Barry Brunner, OT Acute Rehabilitation Services Pager [phone] Office [phone]    Chancy Milroy 02/06/2021, 9:43 AM

## 2021-02-06 NOTE — Progress Notes (Signed)
  Speech Language Pathology Treatment: Dysphagia  Patient Details Name: Leonard [name] MRN: [id number] DOB: 1956-05-06 Today's Date: 02/06/2021 Time: 1325-1400 SLP Time Calculation (min) (ACUTE ONLY): 35 min  Assessment / Plan / Recommendation Clinical Impression  Pt seen for continued dysphagia intervention. Pt with significant xerostomia includling dried secretions throughout oral cavity. Xerostomia noted to be so significant, that pt was suspected to have posterior oral secretions obstructing nasal passages. Pt was mouth breathing upon arrival. SLP provided 25+ minutes of oral care with significant improvements in hygiene, lingual mobility, and pt exhibited ability to breath through nose at end of session. Overall dysphagia, motor speech skills were worsened from initial SLP consult due to new covid infection. Pt was alert throughout session and was able to follow simple commands with repetition. He was eager for single ice chips. Pt exhibited prolonged mastication of ice chips with mild left sided residuals and mild anterior spillage. Pt with suspected delay in swallow initiation per palpation and intermittent delayed coughing. Pt benefits from single ice chips after diligent oral care to substantially improve hydration of oral, nasal, and pharyngeal mucosa. Continue NPO with ice chips with staff and intensive dysphagia intervention.    HPI HPI: Pt is a 65 y.o. male with no significant PMHx who presented via EMS as a code stroke for acute onset of left hemiplegia, left facial droop, dysarthria and left hemineglect. NIHSS was 18 on admission and tPA was given. 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 Pt s/p  s/p bilateral common carotid arteriograms followed by right MCA/ACA/ICA thrombus retrieval with stent angioplasty 4/21. Pt was intubated for procedure. CT head 4/22: Large area of hypoattenuation at the site of known  right MCA territory infarct. Small focus of parenchymal hyperdensity at the posterior aspect of the infarct site [place] indicate a small amount of petechial hemorrhage or contrast extravasation from procedure. Small amount of subarachnoid hyperdensity over the right frontal convexity, either subarachnoid blood or contrast taining.  CXR 4/28 concerning for possible pneumonia.      SLP Plan  Continue with current plan of care       Recommendations  Medication Administration: Via alternative means                Oral Care Recommendations: Oral care BID;Oral care prior to ice chip/H20 Follow up Recommendations: Inpatient Rehab SLP Visit Diagnosis: Dysphagia, oropharyngeal phase (R13.12) Plan: Continue with current plan of care       GO                Ardyth Gal MA, CCC-SLP Acute Rehabilitation Services   02/06/2021, 2:14 PM

## 2021-02-07 ENCOUNTER — Inpatient Hospital Stay (HOSPITAL_COMMUNITY): Payer: Medicare HMO

## 2021-02-07 DIAGNOSIS — U071 COVID-19: Secondary | ICD-10-CM | POA: Diagnosis not present

## 2021-02-07 DIAGNOSIS — I63031 Cerebral infarction due to thrombosis of right carotid artery: Secondary | ICD-10-CM

## 2021-02-07 DIAGNOSIS — R4182 Altered mental status, unspecified: Secondary | ICD-10-CM | POA: Diagnosis not present

## 2021-02-07 LAB — BLOOD GAS, ARTERIAL
Acid-base deficit: 1.2 mmol/L (ref 0.0–2.0)
Bicarbonate: 22.1 mmol/L (ref 20.0–28.0)
Drawn by: 39899
FIO2: 21
O2 Saturation: 94.3 %
Patient temperature: 37
pCO2 arterial: 31.3 mmHg — ABNORMAL LOW (ref 32.0–48.0)
pH, Arterial: 7.462 — ABNORMAL HIGH (ref 7.350–7.450)
pO2, Arterial: 73.5 mmHg — ABNORMAL LOW (ref 83.0–108.0)

## 2021-02-07 LAB — COMPREHENSIVE METABOLIC PANEL
ALT: 105 U/L — ABNORMAL HIGH (ref 0–44)
AST: 75 U/L — ABNORMAL HIGH (ref 15–41)
Albumin: 2.5 g/dL — ABNORMAL LOW (ref 3.5–5.0)
Alkaline Phosphatase: 111 U/L (ref 38–126)
Anion gap: 3 — ABNORMAL LOW (ref 5–15)
BUN: 23 mg/dL (ref 8–23)
CO2: 24 mmol/L (ref 22–32)
Calcium: 8 mg/dL — ABNORMAL LOW (ref 8.9–10.3)
Chloride: 118 mmol/L — ABNORMAL HIGH (ref 98–111)
Creatinine, Ser: 0.79 mg/dL (ref 0.61–1.24)
GFR, Estimated: >60 mL/min (ref >60)
Glucose, Bld: 156 mg/dL — ABNORMAL HIGH (ref 70–99)
Potassium: 3.6 mmol/L (ref 3.5–5.1)
Sodium: 145 mmol/L (ref 135–145)
Total Bilirubin: 0.9 mg/dL (ref 0.3–1.2)
Total Protein: 5.5 g/dL — ABNORMAL LOW (ref 6.5–8.1)

## 2021-02-07 LAB — CBC WITH DIFFERENTIAL/PLATELET
Abs Immature Granulocytes: 0.1 10*3/uL — ABNORMAL HIGH (ref 0.00–0.07)
Basophils Absolute: 0 10*3/uL (ref 0.0–0.1)
Basophils Relative: 0 %
Eosinophils Absolute: 0.4 10*3/uL (ref 0.0–0.5)
Eosinophils Relative: 6 %
HCT: 31.3 % — ABNORMAL LOW (ref 39.0–52.0)
Hemoglobin: 9.6 g/dL — ABNORMAL LOW (ref 13.0–17.0)
Immature Granulocytes: 1 %
Lymphocytes Relative: 20 %
Lymphs Abs: 1.5 10*3/uL (ref 0.7–4.0)
MCH: 29 pg (ref 26.0–34.0)
MCHC: 30.7 g/dL (ref 30.0–36.0)
MCV: 94.6 fL (ref 80.0–100.0)
Monocytes Absolute: 0.6 10*3/uL (ref 0.1–1.0)
Monocytes Relative: 8 %
Neutro Abs: 4.8 10*3/uL (ref 1.7–7.7)
Neutrophils Relative %: 65 %
Platelets: 389 10*3/uL (ref 150–400)
RBC: 3.31 MIL/uL — ABNORMAL LOW (ref 4.22–5.81)
RDW: 12.6 % (ref 11.5–15.5)
WBC: 7.4 10*3/uL (ref 4.0–10.5)
nRBC: 0.5 % — ABNORMAL HIGH (ref 0.0–0.2)

## 2021-02-07 LAB — D-DIMER, QUANTITATIVE: D-Dimer, Quant: 2.26 ug/mL-FEU — ABNORMAL HIGH (ref 0.00–0.50)

## 2021-02-07 LAB — GLUCOSE, CAPILLARY
Glucose-Capillary: 106 mg/dL — ABNORMAL HIGH (ref 70–99)
Glucose-Capillary: 116 mg/dL — ABNORMAL HIGH (ref 70–99)
Glucose-Capillary: 124 mg/dL — ABNORMAL HIGH (ref 70–99)
Glucose-Capillary: 127 mg/dL — ABNORMAL HIGH (ref 70–99)
Glucose-Capillary: 94 mg/dL (ref 70–99)

## 2021-02-07 LAB — C-REACTIVE PROTEIN: CRP: 2.7 mg/dL — ABNORMAL HIGH (ref <1.0)

## 2021-02-07 LAB — MAGNESIUM: Magnesium: 2.3 mg/dL (ref 1.7–2.4)

## 2021-02-07 MED ORDER — PANTOPRAZOLE SODIUM 40 MG PO PACK
40.0000 mg | PACK | Freq: Every day | ORAL | Status: DC
  Administered 2021-02-07 – 2021-02-14 (×8): 40 mg
  Filled 2021-02-07 (×8): qty 20

## 2021-02-07 MED ORDER — HEPARIN SODIUM (PORCINE) 5000 UNIT/ML IJ SOLN
5000.0000 [IU] | Freq: Three times a day (TID) | INTRAMUSCULAR | Status: DC
  Administered 2021-02-07 – 2021-02-14 (×23): 5000 [IU] via SUBCUTANEOUS
  Filled 2021-02-07 (×24): qty 1

## 2021-02-07 NOTE — Progress Notes (Signed)
PROGRESS NOTE  Leonard [name] RUE:[id number] DOB: [birth year] DOA: 01/24/2021 PCP: Pcp, No   LOS: 13 days   Brief Narrative / Interim history:  65 year old male with no significant history came into the hospital with left-sided weakness, left facial droop and dysarthria.  He was found to have an acute right MCA territory infarct status post tPA and bilateral common carotid arteriograms followed by right MCA/ACA/ICA thrombus retrieval with stent angioplasty.  He was admitted in the neuro ICU initially.  Hospitalist service was consulted on 4/29 due to increased work of breathing, he was diagnosed with Covid  Subjective / 24h Interval events:  Physical therapy is in the room.  He seems alert, following commands but does not speak much.  He can tell me his name, has significant slurred speech  Assessment & Plan:  Acute hypoxic respiratory failure due to Covid 19 pneumonia, possible aspiration pneumonia with sepsis physiology  -patient has been having increased work of breathing for few days per wife but on 4/29 much more so and hospitalist service was consulted.  He was placed on antibiotics due to concern for aspiration given poor mental status. -Chest x-ray with developing multifocal pneumonia.  COVID was sent 4/30 and returned positive -Status post Actemra on 4/30, also placed on Remdesivir along with steroids.  His respiratory status seems to be improving, he is now stable , Down to 2 L today. -Procalcitonin relatively low, but cannot fully rule out ongoing aspiration.  Completed 5 days of antibiotics with Unasyn, discontinued today. 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  His MRI on admission showed large acute infarct of the right MCA territory involving the frontal operculum, insula and basal ganglia with hemorrhagic conversion.  A 2D echo showed EF 60-65%, mild LVH.  LDL is 126.  A1c is 5.8.  He is currently on aspirin and Brilinta.  Has a core track for feeding and family agrees to PEG but will need to get over Covid first. -Repeat CT scan stable -PT recommends CIR, will need to be off isolation  Cerebral edema  -goal sodium 145-150.  Improving with free water via NG tube and D5W, sodium is down to 145 today, will DC D5W.    Essential hypertension -continue amlodipine, labetalol  Hyperlipidemia -continue statin  Dysphagia -due to stroke, has a core track, continue tube feeds.  Family wants PEG, will hold on requesting PEG given active COVID-19 infection, as well patient remains on Brilinta, will discuss with IR when patient is off COVID isolation if it can be done while patient on Brilinta.  Scheduled Meds: . amLODipine  10 mg Per Tube Daily  . vitamin C  500 mg Per Tube Daily  . aspirin  81 mg Oral Daily   Or  . aspirin  81 mg Per Tube Daily  . atorvastatin  80 mg Per Tube Daily  . chlorhexidine  15 mL Mouth Rinse BID  . Chlorhexidine Gluconate Cloth  6 each Topical Daily  . feeding supplement (PROSource TF)  45 mL Per Tube TID  . free water  400 mL Per Tube  Q4H  . labetalol  200 mg Per Tube BID  . mouth rinse  15 mL Mouth Rinse q12n4p  . predniSONE  50 mg Per Tube Daily  . senna-docusate  1 tablet Per Tube Daily  . ticagrelor  90 mg Oral BID   Or  . ticagrelor  90 mg Per Tube BID  . zinc sulfate  220 mg Oral Daily   Continuous Infusions: . feeding supplement (OSMOLITE 1.5 CAL) 50 mL/hr at 02/07/21 0440   PRN Meds:.acetaminophen **OR** acetaminophen (TYLENOL) oral liquid 160 mg/5 mL **OR** acetaminophen, albuterol, labetalol  Diet  Orders (From admission, onward)    Start     Ordered   01/25/21 0300  Diet NPO time specified  Diet effective now        01/25/21 0259          DVT prophylaxis: SCD's Start: 01/25/21 0006, will start on [place] heparin, no contraindication as discussed with neurology Dr. Deno Etienne today..     Code Status: Full Code  Family Communication: None at bedside  Status is: Inpatient  Remains inpatient appropriate because:Inpatient level of care appropriate due to severity of illness   Dispo: The patient is from: Home              Anticipated d/c is to: CIR              Patient currently is not medically stable to d/c.   Difficult to place patient No   Level of care: Progressive  Consultants:  Neurology   Procedures:  2D echo  Microbiology  1/4 bottles-staph epi blood cultures 4/28 SARS-CoV-2 + on 4/30  Antimicrobials: Cefepime 4/30 >> 5/1 Unasyn 5/1 >> 5/3 Remdesivir 4/30 >>  Objective: Vitals:   02/06/21 2351 02/07/21 0328 02/07/21 0336 02/07/21 0700  BP: 139/70 (!) 124/91  139/65  Pulse: 73 68  68  Resp: (!) 25 (!) 21  17  Temp: 98.5 F (36.9 C) 98.6 F (37 C)    TempSrc: Axillary Axillary    SpO2: 92% 96%  91%  Weight:   77.7 kg   Height:        Intake/Output Summary (Last 24 hours) at 02/07/2021 1106 Last data filed at 02/07/2021 0440 Gross per 24 hour  Intake 4782.18 ml  Output 2351 ml  Net 2431.18 ml   Filed Weights   01/31/21 0400 02/03/21 0500 02/07/21 0336  Weight: 79.7 kg 79 kg 77.7 kg    Examination:  Awake Alert,altered ,follow simple command, has left arm weakness Symmetrical Chest wall movement, Good air movement bilaterally, CTAB RRR,No Gallops,Rubs or new Murmurs, No Parasternal Heave +ve B.Sounds, Abd Soft, No tenderness, No rebound - guarding or rigidity. No Cyanosis, Clubbing or edema, No new Rash or bruise     Data Reviewed: I have independently reviewed following labs and imaging studies   CBC: Recent Labs  Lab 02/01/21 0350  02/02/21 0108 02/03/21 0259 02/04/21 0300 02/05/21 0346 02/06/21 0526 02/07/21 0234  WBC 15.5*   < > 13.0* 5.4 4.2 4.8 7.4  NEUTROABS 12.1*  --   --  4.5 3.4 2.3 4.8  HGB 10.8*   < > 9.7* 9.9* 9.9* 9.3* 9.6*  HCT 33.7*   < > 30.7* 32.3* 31.7* 30.7* 31.3*  MCV 93.4   < > 93.9 96.1 93.8 96.2 94.6  PLT 366   < > 369 423* 413* 392 389   < > = values in this interval not displayed.   Basic Metabolic Panel: Recent Labs  Lab  02/03/21 0259 02/04/21 0300 02/05/21 0346 02/06/21 0526 02/07/21 0234  NA 152* 154* 156* 152* 145  K 3.7 3.8 4.1 3.6 3.6  CL 120* 120* 125* 122* 118*  CO2 21* 22 22 24 24   GLUCOSE 177* 159* 169* 160* 156*  BUN 32* 33* 33* 27* 23  CREATININE 0.95 0.86 0.93 0.87 0.79  CALCIUM 8.3* 8.6* 8.5* 8.0* 8.0*  MG  --  2.9* 2.8* 2.7* 2.3   Liver Function Tests: Recent Labs  Lab 02/04/21 0300 02/05/21 0346 02/06/21 0526 02/07/21 0234  AST 72* 93* 88* 75*  ALT 71* 95* 107* 105*  ALKPHOS 168* 146* 120 111  BILITOT 1.4* 1.0 1.0 0.9  PROT 6.6 6.3* 5.5* 5.5*  ALBUMIN 2.5* 2.4* 2.3* 2.5*   Coagulation Profile: No results for input(s): INR, PROTIME in the last 168 hours. HbA1C: No results for input(s): HGBA1C in the last 72 hours. CBG: Recent Labs  Lab 02/06/21 1119 02/06/21 1550 02/06/21 2113 02/06/21 2358 02/07/21 0323  GLUCAP 134* 151* 169* 127* 116*    Recent Results (from the past 240 hour(s))  Culture, Urine     Status: None   Collection Time: 02/01/21  2:06 PM   Specimen: Urine, Random  Result Value Ref Range Status   Specimen Description URINE, RANDOM  Final   Special Requests NONE  Final   Culture   Final    NO GROWTH Performed at [facility], [address], [place], [place]    Report Status 02/02/2021 FINAL  Final  Culture, blood (routine x 2)     Status: None   Collection Time: 02/01/21  5:17 PM   Specimen: BLOOD RIGHT ARM  Result Value Ref Range Status   Specimen Description BLOOD RIGHT ARM  Final   Special  Requests   Final    BOTTLES DRAWN AEROBIC ONLY Blood Culture results [place] not be optimal due to an inadequate volume of blood received in culture bottles   Culture   Final    NO GROWTH 5 DAYS Performed at [facility], [address] [place], [place]    Report Status 02/06/2021 FINAL  Final  Culture, blood (routine x 2)     Status: Abnormal   Collection Time: 02/01/21  5:17 PM   Specimen: BLOOD LEFT HAND  Result Value Ref Range Status   Specimen Description BLOOD LEFT HAND  Final   Special Requests   Final    BOTTLES DRAWN AEROBIC ONLY Blood Culture results [place] not be optimal due to an inadequate volume of blood received in culture bottles   Culture  Setup Time   Final    GRAM POSITIVE COCCI IN CLUSTERS AEROBIC BOTTLE ONLY CRITICAL RESULT CALLED TO, READ BACK BY AND VERIFIED WITH: J CARNEY [facility] 02/02/21 2137 JDW    Culture (A)  Final    STAPHYLOCOCCUS EPIDERMIDIS STAPHYLOCOCCUS CAPITIS THE SIGNIFICANCE OF ISOLATING THIS ORGANISM FROM A SINGLE SET OF BLOOD CULTURES WHEN MULTIPLE SETS ARE DRAWN IS UNCERTAIN. PLEASE NOTIFY THE MICROBIOLOGY DEPARTMENT WITHIN ONE WEEK IF SPECIATION AND SENSITIVITIES ARE REQUIRED. Performed at [facility], [address] [place], [place]    Report Status 02/05/2021 FINAL  Final  Blood Culture ID Panel (Reflexed)     Status: Abnormal   Collection Time: 02/01/21  5:17 PM  Result Value Ref Range Status   Enterococcus faecalis NOT DETECTED NOT DETECTED Final   Enterococcus Faecium NOT DETECTED NOT DETECTED Final   Listeria monocytogenes NOT DETECTED  NOT DETECTED Final   Staphylococcus species DETECTED (A) NOT DETECTED Final    Comment: CRITICAL RESULT CALLED TO, READ BACK BY AND VERIFIED WITH: J CARNEY PHARMD 02/02/21 2137 JDW    Staphylococcus aureus (BCID) NOT DETECTED NOT DETECTED Final   Staphylococcus epidermidis DETECTED (A) NOT DETECTED Final    Comment: Methicillin (oxacillin) resistant coagulase negative  staphylococcus. Possible blood culture contaminant (unless isolated from more than one blood culture draw or clinical case suggests pathogenicity). No antibiotic treatment is indicated for blood  culture contaminants. CRITICAL RESULT CALLED TO, READ BACK BY AND VERIFIED WITH: J CARNEY PHARMD 02/02/21 2137 JDW    Staphylococcus lugdunensis NOT DETECTED NOT DETECTED Final   Streptococcus species NOT DETECTED NOT DETECTED Final   Streptococcus agalactiae NOT DETECTED NOT DETECTED Final   Streptococcus pneumoniae NOT DETECTED NOT DETECTED Final   Streptococcus pyogenes NOT DETECTED NOT DETECTED Final   A.calcoaceticus-baumannii NOT DETECTED NOT DETECTED Final   Bacteroides fragilis NOT DETECTED NOT DETECTED Final   Enterobacterales NOT DETECTED NOT DETECTED Final   Enterobacter cloacae complex NOT DETECTED NOT DETECTED Final   Escherichia coli NOT DETECTED NOT DETECTED Final   Klebsiella aerogenes NOT DETECTED NOT DETECTED Final   Klebsiella oxytoca NOT DETECTED NOT DETECTED Final   Klebsiella pneumoniae NOT DETECTED NOT DETECTED Final   Proteus species NOT DETECTED NOT DETECTED Final   Salmonella species NOT DETECTED NOT DETECTED Final   Serratia marcescens NOT DETECTED NOT DETECTED Final   Haemophilus influenzae NOT DETECTED NOT DETECTED Final   Neisseria meningitidis NOT DETECTED NOT DETECTED Final   Pseudomonas aeruginosa NOT DETECTED NOT DETECTED Final   Stenotrophomonas maltophilia NOT DETECTED NOT DETECTED Final   Candida albicans NOT DETECTED NOT DETECTED Final   Candida auris NOT DETECTED NOT DETECTED Final   Candida glabrata NOT DETECTED NOT DETECTED Final   Candida krusei NOT DETECTED NOT DETECTED Final   Candida parapsilosis NOT DETECTED NOT DETECTED Final   Candida tropicalis NOT DETECTED NOT DETECTED Final   Cryptococcus neoformans/gattii NOT DETECTED NOT DETECTED Final   Methicillin resistance mecA/C DETECTED (A) NOT DETECTED Final    Comment: CRITICAL RESULT CALLED TO,  READ BACK BY AND VERIFIED WITH: Arnaldo NatalJ CARNEY [facility] 02/02/21 2137 JDW Performed at [facility], [address] [place], [place] [address]   MRSA PCR Screening     Status: None   Collection Time: 02/02/21 11:30 PM   Specimen: Nasal Mucosa; Nasopharyngeal  Result Value Ref Range Status   MRSA by PCR NEGATIVE NEGATIVE Final    Comment:        The GeneXpert MRSA Assay (FDA approved for NASAL specimens only), is one component of a comprehensive MRSA colonization surveillance program. It is not intended to diagnose MRSA infection nor to guide or monitor treatment for MRSA infections. Performed at [facility], [address] [place], [place] [address]   Resp Panel by RT-PCR (Flu A&B, Covid) Nasopharyngeal Swab     Status: Abnormal   Collection Time: 02/03/21  9:38 AM   Specimen: Nasopharyngeal Swab; Nasopharyngeal(NP) swabs in vial transport medium  Result Value Ref Range Status   SARS Coronavirus 2 by RT PCR POSITIVE (A) NEGATIVE Final    Comment: RESULT CALLED TO, READ BACK BY AND VERIFIED WITH: A,PENNINGTON RN @1046  02/03/21 EB (NOTE) SARS-CoV-2 target nucleic acids are DETECTED.  The SARS-CoV-2 RNA is generally detectable in upper respiratory specimens during the acute phase of infection. Positive results are indicative of the presence of the identified virus,  but do not rule out bacterial infection or co-infection with other pathogens not detected by the test. Clinical correlation with patient history and other diagnostic information is necessary to determine patient infection status. The expected result is Negative.  Fact Sheet for Patients: BloggerCourse.com  Fact Sheet for Healthcare Providers: SeriousBroker.it  This test is not yet approved or cleared by the [place] FDA and  has been authorized for detection and/or diagnosis of SARS-CoV-2 by FDA under an Emergency Use Authorization (EUA).  This EUA  will remain in effect (meaning this test can be  used) for the duration of  the COVID-19 declaration under Section 564(b)(1) of the Act, 21 U.S.C. section 360bbb-3(b)(1), unless the authorization is terminated or revoked sooner.     Influenza A by PCR NEGATIVE NEGATIVE Final   Influenza B by PCR NEGATIVE NEGATIVE Final    Comment: (NOTE) The Xpert Xpress SARS-CoV-2/FLU/RSV plus assay is intended as an aid in the diagnosis of influenza from Nasopharyngeal swab specimens and should not be used as a sole basis for treatment. Nasal washings and aspirates are unacceptable for Xpert Xpress SARS-CoV-2/FLU/RSV testing.  Fact Sheet for Patients: BloggerCourse.com  Fact Sheet for Healthcare Providers: SeriousBroker.it  This test is not yet approved or cleared by the [place] FDA and has been authorized for detection and/or diagnosis of SARS-CoV-2 by FDA under an Emergency Use Authorization (EUA). This EUA will remain in effect (meaning this test can be used) for the duration of the COVID-19 declaration under Section 564(b)(1) of the Act, 21 U.S.C. section 360bbb-3(b)(1), unless the authorization is terminated or revoked.  Performed at [facility], [address], [place], [place] [address]      Radiology Studies: No results found.  Huey Bienenstock MD [facility]  Between 7 am - 7 pm I am available, please contact me via Amion (for emergencies) or Securechat (non urgent messages)  Between 7 pm - 7 am I am not available, please contact night coverage MD/APP via Amion

## 2021-02-07 NOTE — Significant Event (Signed)
Rapid Response Event Note   Reason for Call :  Unresponsive  Initial Focused Assessment:  Patient is difficult to arouse.  He will arouse to deep painful stimuli.  Moving all extremities except Left arm.  He was admitted with CVA post tPA and IR.  BP 100/72  SR 63  RR 26  O2 sat 97% on RA  Lung sounds decreased bases Heart tones regular  CBG 106     Interventions:  Stimulation to awake patient Dr Randol Kern at bedside to assess patient  Plan of Care:  ABG Stat Head CT EEG   Event Summary:   MD Notified: Elgergawy Call Time: 1228 Arrival Time: 1231 End Time: 1250  Marcellina Millin, RN

## 2021-02-07 NOTE — Progress Notes (Signed)
EEG complete - results pending. EEG complete - results pending.  

## 2021-02-07 NOTE — Progress Notes (Signed)
RT NOTE: ABG sample obtained and sent down to main lab without complications. Lab notified of sample being sent.

## 2021-02-07 NOTE — Progress Notes (Signed)
  Speech Language Pathology Treatment: Dysphagia;Cognitive-Linquistic  Patient Details Name: Leonard [name] MRN: [id number] DOB: 1956-09-11 Today's Date: 02/07/2021 Time: 1005-1030 SLP Time Calculation (min) (ACUTE ONLY): 25 min  Assessment / Plan / Recommendation Clinical Impression  Upon arrival to room, pt had worked his way down bed and was reclined below 30% despite bed positioned above that angle.  Repositioned to optimize safety with TF and garner improved participation in therapy. Pt's oral hygiene was much improved today from yesterday.  Provided oral care and offered single ice chips. Pt fluctuated between being attentive and following commands followed by periods of poor initiation and impersistence during tasks, requiring max cues to resume activity.  He was oriented to place, person, and basic situation.  Required mod cues to improve clarity of speech due to significant dysarthria.  During times of inattention, he required max verbal/tactile/visual cues to initiate process of chewing/swallowing.  Ice chips led to consistent coughing, with removal of material via oral suctioning.    Pt's progress with swallowing will continue, but will likely be slow.  He Lacount benefit from further discussion with family re: PEG for longer-term support while he continues in therapy.  For now, continue offering ice chips periodically throughout the day (4-5 after oral care) to facilitate spontaneous swallowing and help maintain integrity of oral/pharyngeal mucosa.   SLP will follow.   HPI HPI: Pt is a 65 y.o. male with no significant PMHx who presented via EMS as a code stroke for acute onset of left hemiplegia, left facial droop, dysarthria and left hemineglect. NIHSS was 18 on admission and tPA was given. 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 Pt s/p  s/p bilateral common carotid arteriograms followed by right MCA/ACA/ICA  thrombus retrieval with stent angioplasty 4/21. Pt was intubated for procedure. CT head 4/22: Large area of hypoattenuation at the site of known right MCA territory infarct. Small focus of parenchymal hyperdensity at the posterior aspect of the infarct site Soter indicate a small amount of petechial hemorrhage or contrast extravasation from procedure. Small amount of subarachnoid hyperdensity over the right frontal convexity, either subarachnoid blood or contrast taining.  CXR 4/28 concerning for possible pneumonia.      SLP Plan  Continue with current plan of care       Recommendations  Diet recommendations: NPO Medication Administration: Via alternative means                Oral Care Recommendations: Oral care QID SLP Visit Diagnosis: Dysphagia, oropharyngeal phase (R13.12) Plan: Continue with current plan of care       GO              Leonard August L. Samson Frederic, MA CCC/SLP Acute Rehabilitation Services Office number [phone] Pager [phone]   Leonard [name] 02/07/2021, 10:39 AM

## 2021-02-07 NOTE — Procedures (Signed)
Patient Name: Leonard [name]  MRN: [id number]  Epilepsy Attending: Charlsie Quest  Referring Physician/Provider: Dr Huey Bienenstock Date: 02/07/2021 Duration: 23.14 mins  Patient history: 65yo M with right MCA infarct and ams. EEG to evaluate for seizure  Level of alertness: Awake  AEDs during EEG study: None  Technical aspects: This EEG study was done with scalp electrodes positioned according to the 10-20 International system of electrode placement. Electrical activity was acquired at a sampling rate of 500Hz  and reviewed with a high frequency filter of 70Hz  and a low frequency filter of 1Hz . EEG data were recorded continuously and digitally stored.   Description: The posterior dominant rhythm consists of 8 Hz activity of moderate voltage (25-35 uV) seen predominantly in posterior head regions, asymmetric( R<L) and reactive to eye opening and eye closing. EEG showed continuous generalized and lateralized right hemisphere 3 to 6 Hz theta-delta slowing.  Hyperventilation and photic stimulation were not performed.     ABNORMALITY - Continuous slow, generalized and lateralized right hemisphere  IMPRESSION: This study is suggestive of cortical dysfunction arising from right hemisphere likely secondary to underlying structural abnormality/stroke. Additionally, there is mild diffuse encephalopathy, nonspecific etiology. No seizures or epileptiform discharges were seen throughout the recording.   Dejha King 

## 2021-02-07 NOTE — Progress Notes (Signed)
RN was notified by PT who had been in patient's room that patient was unresponsive, PT had attempted to rouse the patient with verbal and painful stimuli and a sternal rub with no effect but was in sinus rhythm with spontaneous respirations. RN called rapid response and MD and when to assess patient with the charge nurse. Patient was still un-arousable. RR and MD at bedside, patient began slowly regaining responsiveness. MD ordered stat CT, EEG, and ABG.

## 2021-02-07 NOTE — Progress Notes (Signed)
Inpatient Rehab Admissions Coordinator:   Note that pt. Remains on airborne/contact precautions for COVID (positive sine 4/30). Patients are eligible to be considered for admit to the [facility] when cleared from airborne precautions by Acute MD, or Infectious Disease MD.  Otherwise, they will need to be >20 days from their positive test with recovery/improvement in symptoms or 2 negative tests.  Will  Continue to follow from a distance for now.   Megan Salon, MS, CCC-SLP Rehab Admissions Coordinator  [phone] (celll) [phone] (office)

## 2021-02-07 NOTE — Progress Notes (Signed)
PT Cancellation Note  Patient Details Name: Leonard [name] MRN: [id number] DOB: May 29, 1956   Cancelled Treatment:    Reason Eval/Treat Not Completed: Patient's level of consciousness Patient difficult to arouse. Did not arouse to verbal or noxious/painful stimuli. PT will re-attempt as time allows.   Khing Belcher A. Dan Humphreys PT, DPT Acute Rehabilitation Services Pager [phone] Office [phone]    Viviann Spare 02/07/2021, 12:29 PM

## 2021-02-08 ENCOUNTER — Encounter (HOSPITAL_COMMUNITY): Payer: Self-pay | Admitting: Neurology

## 2021-02-08 LAB — GLUCOSE, CAPILLARY
Glucose-Capillary: 100 mg/dL — ABNORMAL HIGH (ref 70–99)
Glucose-Capillary: 131 mg/dL — ABNORMAL HIGH (ref 70–99)
Glucose-Capillary: 138 mg/dL — ABNORMAL HIGH (ref 70–99)
Glucose-Capillary: 154 mg/dL — ABNORMAL HIGH (ref 70–99)
Glucose-Capillary: 99 mg/dL (ref 70–99)
Glucose-Capillary: 99 mg/dL (ref 70–99)

## 2021-02-08 LAB — CBC WITH DIFFERENTIAL/PLATELET
Abs Immature Granulocytes: 0.12 10*3/uL — ABNORMAL HIGH (ref 0.00–0.07)
Basophils Absolute: 0 10*3/uL (ref 0.0–0.1)
Basophils Relative: 0 %
Eosinophils Absolute: 0.5 10*3/uL (ref 0.0–0.5)
Eosinophils Relative: 6 %
HCT: 29.9 % — ABNORMAL LOW (ref 39.0–52.0)
Hemoglobin: 9.6 g/dL — ABNORMAL LOW (ref 13.0–17.0)
Immature Granulocytes: 1 %
Lymphocytes Relative: 20 %
Lymphs Abs: 1.7 10*3/uL (ref 0.7–4.0)
MCH: 29.8 pg (ref 26.0–34.0)
MCHC: 32.1 g/dL (ref 30.0–36.0)
MCV: 92.9 fL (ref 80.0–100.0)
Monocytes Absolute: 0.7 10*3/uL (ref 0.1–1.0)
Monocytes Relative: 8 %
Neutro Abs: 5.4 10*3/uL (ref 1.7–7.7)
Neutrophils Relative %: 65 %
Platelets: 386 10*3/uL (ref 150–400)
RBC: 3.22 MIL/uL — ABNORMAL LOW (ref 4.22–5.81)
RDW: 12.5 % (ref 11.5–15.5)
WBC: 8.5 10*3/uL (ref 4.0–10.5)
nRBC: 0.2 % (ref 0.0–0.2)

## 2021-02-08 LAB — COMPREHENSIVE METABOLIC PANEL
ALT: 94 U/L — ABNORMAL HIGH (ref 0–44)
AST: 63 U/L — ABNORMAL HIGH (ref 15–41)
Albumin: 2.4 g/dL — ABNORMAL LOW (ref 3.5–5.0)
Alkaline Phosphatase: 100 U/L (ref 38–126)
Anion gap: 7 (ref 5–15)
BUN: 21 mg/dL (ref 8–23)
CO2: 24 mmol/L (ref 22–32)
Calcium: 7.9 mg/dL — ABNORMAL LOW (ref 8.9–10.3)
Chloride: 109 mmol/L (ref 98–111)
Creatinine, Ser: 0.71 mg/dL (ref 0.61–1.24)
GFR, Estimated: >60 mL/min (ref >60)
Glucose, Bld: 122 mg/dL — ABNORMAL HIGH (ref 70–99)
Potassium: 3.5 mmol/L (ref 3.5–5.1)
Sodium: 140 mmol/L (ref 135–145)
Total Bilirubin: 1 mg/dL (ref 0.3–1.2)
Total Protein: 5.2 g/dL — ABNORMAL LOW (ref 6.5–8.1)

## 2021-02-08 LAB — D-DIMER, QUANTITATIVE: D-Dimer, Quant: 2.48 ug/mL-FEU — ABNORMAL HIGH (ref 0.00–0.50)

## 2021-02-08 LAB — MAGNESIUM: Magnesium: 2.3 mg/dL (ref 1.7–2.4)

## 2021-02-08 LAB — C-REACTIVE PROTEIN: CRP: 1.1 mg/dL — ABNORMAL HIGH (ref <1.0)

## 2021-02-08 MED ORDER — FREE WATER
300.0000 mL | Status: DC
  Administered 2021-02-08 – 2021-02-14 (×35): 300 mL

## 2021-02-08 MED ORDER — OSMOLITE 1.5 CAL PO LIQD
1000.0000 mL | ORAL | Status: DC
  Administered 2021-02-08 – 2021-02-14 (×4): 1000 mL
  Filled 2021-02-08 (×2): qty 1000

## 2021-02-08 NOTE — Progress Notes (Signed)
Nutrition Follow-up  DOCUMENTATION CODES:   Not applicable  INTERVENTION:  Continue TF via Cortrak: -Osmolite 1.5 @ 43m/hr (14478md) -4558mrosource TF TID -Free water per MD (currently 400m46mH)  Provides 2280 kcals, 123g protein, 1097ml22me water (3497ml 98ml free water with flushes)  Pt/family agreeable to PEG placement once off COVID precautions  NUTRITION DIAGNOSIS:   Inadequate oral intake related to inability to eat as evidenced by NPO status. 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   4/20 - intubated 4/21 - extubated; NGT placed 4/22 - pt pulled NGT; cortrak placed (gastric tip) 4/28 - CXR concerning for possible PNA 4/29 - hospitalist service requested due to increased work of breathing 4/30 - COVID positive   Per RN, pt had a rapid response event yesterday due to being difficult to arouse. STAT head CT, EEG, and ABG sample obtained. Per Neurology, EEG suggestive of cortical dysfunction arising from R hemisphere likely 2/2 underlying structural abnormality/stroke; mild diffuse encephalopathy also noted, nonspecific etiology.   Pt remains NPO. SLP following. Per MD, pt/family agreeable to pt having PEG placed; however, pt will need to be off COVID precautions prior to placement. Pt continues to tolerate TF via Cortrak in the meantime. Current regimen: Osmolite 1.5 @ 50ml/h22mth 45ml Pr34mrce TF TID and 400ml fre59mter Q4H (per MD). Note pt with consistent weight loss since admission and new COVID diagnosis, so will increase estimated energy needs and adjust TF regimen to provide more kcals/protein.  Admit weight: 82.6 kg Current weight: 77.7 kg  UOP: 1.8L x24 hours I/O: +4.2L  since admit  Non-pitting edema noted to BUE per RN assessment  Medications: vitamin C, protonix, deltasone, senokot-s, zinc sulfate Labs reviewed. CBGs 138-131-100  Diet Order:   Diet Order            Diet NPO time specified  Diet effective now                 EDUCATION NEEDS:   No education needs have been identified at this time  Skin:  Skin Assessment: Reviewed RN Assessment  Last BM:  5/5 type 7  Height:   Ht Readings from Last 1 Encounters:  01/25/21 _0  (1.753 m)    Weight:   Wt Readings from Last 1 Encounters:  02/07/21 77.7 kg    Ideal Body Weight:  72.7 kg  BMI:  Body mass index is 25.3 kg/m.  Estimated Nutritional Needs:   Kcal:  2100-2300  Protein:  105-125 grams  Fluid:  >2L/d    Sloan Galentine AvLarkin Ina LDN RD pager number and weekend/on-call pager number located in [email or web address]

## 2021-02-08 NOTE — Progress Notes (Signed)
Occupational Therapy Treatment Patient Details Name: Leonard [name] MRN: [id number] DOB: Oct 30, 1955 Today's Date: 02/08/2021    History of present illness 65 y.o. male who presented 4/20 with acute onset L hemiplegia, L facial droop, dysarthria, and L hemineglect. tPA was administered. 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 S/p bil common carotid arteriograms and revascularization of occluded R MCA and R ACA prox and R ICA terminus. ETT 4/20-4/21. COVID + 4/30. No significant PMH.   OT comments  Pt supine in bed, alert upon entry. Agreeable to OT/PT session.  Patient completing bed mobility with mod assist +2 safety, sitting EOB up to max assist for dynamic balance while attempting to don socks but at best close supervision statically.  Requires max assist LB dressing, total assist for toileting.  Cognitively, pt aware of place and situation today; follows simple 1 step commands inconsistently and is easily distracted requiring constant redirection. Engaged in L UE exercises and requires mulitmodal cueing for minimal shoulder ROM, cueing for pacing throughout to complete AAROM and neuro reeducation to UE.  Will follow acutely. CIR remains appropriate.    Follow Up Recommendations  CIR;Supervision/Assistance - 24 hour    Equipment Recommendations  None recommended by OT    Recommendations for Other Services      Precautions / Restrictions Precautions Precautions: Fall Precaution Comments: L neglect, R mitt Restrictions Weight Bearing Restrictions: No       Mobility Bed Mobility Overal bed mobility: Needs Assistance Bed Mobility: Rolling;Sidelying to Sit     Supine to sit: Mod assist;+2 for safety/equipment Sit to supine: Mod assist;+2 for safety/equipment    General bed mobility comments: trunk support and scooting towards EOB    Transfers Overall transfer level: Needs assistance Equipment used: 2 person hand held assist Transfers: Sit to/from Stand Sit to Stand: Min assist;+2 physical assistance;+2 safety/equipment         General transfer comment: min assist +2 to power up and steady from EOB, cueing for hand placement and sequencing    Balance Overall balance assessment: Needs assistance Sitting-balance support: Feet supported;No upper extremity supported Sitting balance-Leahy Scale: Poor Sitting balance - Comments: close supervision for safety statically but up to max assist dynamically when attempting to don socks at EOB Postural control: Posterior lean Standing balance support: Single extremity supported;Bilateral upper extremity supported;During functional activity Standing balance-Leahy Scale: Poor Standing balance comment: min assist to mod assist +2 dynamically                           ADL either performed or assessed with clinical judgement   ADL Overall ADL's : Needs assistance/impaired     Grooming: Supervision/safety;Set up;Wash/dry face;Sitting Grooming Details (indicate cue type and reason): washing face seated EOB with supervision using R UE         Upper Body Dressing : Maximal assistance;Sitting Upper Body Dressing Details (indicate cue type and reason): to don new gown Lower Body Dressing: Sit to/from stand;Maximal assistance;+2 for safety/equipment Lower Body Dressing Details (indicate cue type and reason): assist for socks, up to max assist for dynamic balance and min assist +2 sit to stand Toilet Transfer: Moderate assistance;+2 for physical assistance;+2 for safety/equipment Toilet Transfer Details (indicate cue type and reason): simulated marching Toileting- Clothing Manipulation and Hygiene:  Total assistance;+2 for safety/equipment;+2 for physical assistance;Sit to/from stand Toileting -  Clothing Manipulation Details (indicate cue type and reason): incontinent bowel in bed, standing for hygiene with total assist     Functional mobility during ADLs: Minimal assistance;Moderate assistance;+2 for physical assistance;+2 for safety/equipment General ADL Comments: pt limited by balance, L sided hemi and neglect, decreased activity tolerance and cognition     Vision       Perception     Praxis      Cognition Arousal/Alertness: Awake/alert Behavior During Therapy: Flat affect Overall Cognitive Status: Impaired/Different from baseline Area of Impairment: Attention;Memory;Following commands;Safety/judgement;Awareness;Problem solving                   Current Attention Level: Sustained Memory: Decreased short-term memory Following Commands: Follows one step commands consistently;Follows one step commands with increased time Safety/Judgement: Decreased awareness of safety;Decreased awareness of deficits Awareness: Intellectual Problem Solving: Slow processing;Decreased initiation;Difficulty sequencing;Requires verbal cues;Requires tactile cues General Comments: patient alert today, oriented to situation and place (time not tested).  patient requires mulitmodal cueing to follow commands, highly distractable and impulsive at times.        Exercises Exercises: Other exercises Other Exercises Other Exercises: Engaged in L UE exercises with max cueing for sequencing and pacing--shoulder elevation and retraction (bilaterally), foward flexion and extension shoulder   Shoulder Instructions       General Comments pt on RA with VSS    Pertinent Vitals/ Pain       Pain Assessment: Faces Faces Pain Scale: No hurt Pain Intervention(s): Monitored during session  Home Living                                          Prior Functioning/Environment              Frequency  Min 2X/week        Progress Toward Goals  OT Goals(current goals can  now be found in the care plan section)  Progress towards OT goals: Progressing toward goals  Acute Rehab OT Goals Patient Stated Goal: none stated  Plan Discharge plan remains appropriate;Frequency remains appropriate    Co-evaluation    PT/OT/SLP Co-Evaluation/Treatment: Yes Reason for Co-Treatment: Complexity of the patient's impairments (multi-system involvement);For patient/therapist safety;Necessary to address cognition/behavior during functional activity;To address functional/ADL transfers   OT goals addressed during session: ADL's and self-care      AM-PAC OT "6 Clicks" Daily Activity     Outcome Measure   Help from another person eating meals?: Total Help from another person taking care of personal grooming?: A Lot Help from another person toileting, which includes using toliet, bedpan, or urinal?: Total Help from another person bathing (including washing, rinsing, drying)?: A Lot Help from another person to put on and taking off regular upper body clothing?: A Lot Help from another person to put on and taking off regular lower body clothing?: A Lot 6 Click Score: 10    End of Session Equipment Utilized During Treatment: Gait belt  OT Visit Diagnosis: Unsteadiness on feet (R26.81);Cognitive communication deficit (R41.841) Symptoms and signs involving cognitive functions: Cerebral infarction   Activity Tolerance Patient tolerated treatment well   Patient Left with call bell/phone within reach;in bed;with bed alarm set;with nursing/sitter in room;with restraints reapplied   Nurse Communication Mobility status        Time: 1345-1421 OT Time Calculation (min): 36 min  Charges:  OT General Charges $OT Visit: 1 Visit OT Treatments $Self Care/Home Management : 8-22 mins  Barry Brunner, OT Acute Rehabilitation Services Pager [phone] Office [phone]    Chancy Milroy 02/08/2021, 4:01 PM

## 2021-02-08 NOTE — Progress Notes (Signed)
Physical Therapy Treatment Patient Details Name: Leonard [name] MRN: [id number] DOB: June 25, 1956 Today's Date: 02/08/2021    History of Present Illness 65 y.o. male who presented 4/20 with acute onset L hemiplegia, L facial droop, dysarthria, and L hemineglect. tPA was administered. 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 S/p bil common carotid arteriograms and revascularization of occluded R MCA and R ACA prox and R ICA terminus. ETT 4/20-4/21. COVID + 4/30. No significant PMH.    PT Comments    Patient agreeable to PT/OT session. Patient more awake and alert this date. Patient able to perform bed mobility with modA+2. Performed seated LE exercises with min guard. Patient at times required maxA to maintain sitting EOB due to heavy posterior lean. Patient difficulty following commands this session and easily distracted. Continue to recommend comprehensive inpatient rehab (CIR) for post-acute therapy needs.     Follow Up Recommendations  CIR;Supervision/Assistance - 24 hour     Equipment Recommendations  3in1 (PT);Wheelchair (measurements PT);Wheelchair cushion (measurements PT);Other (comment)    Recommendations for Other Services       Precautions / Restrictions Precautions Precautions: Fall Precaution Comments: L neglect, R mitt Restrictions Weight Bearing Restrictions: No    Mobility  Bed Mobility Overal bed mobility: Needs Assistance Bed Mobility: Supine to Sit;Sit to Supine     Supine to sit: Mod assist;+2 for safety/equipment Sit to supine: Mod assist;+2 for safety/equipment   General bed mobility comments: trunk support and scooting towards EOB    Transfers Overall transfer level: Needs assistance Equipment used: 2 person hand held assist Transfers:  Sit to/from Stand Sit to Stand: Min assist;+2 physical assistance;+2 safety/equipment         General transfer comment: min assist +2 to power up and steady from EOB, cueing for hand placement and sequencing  Ambulation/Gait             General Gait Details: deferred due to fatigue   Stairs             Wheelchair Mobility    Modified Rankin (Stroke Patients Only) Modified Rankin (Stroke Patients Only) Pre-Morbid Rankin Score: No symptoms Modified Rankin: Moderately severe disability     Balance Overall balance assessment: Needs assistance Sitting-balance support: Feet supported;No upper extremity supported Sitting balance-Leahy Scale: Poor Sitting balance - Comments: close supervision for safety statically but up to max assist dynamically when attempting to don socks at EOB Postural control: Posterior lean Standing balance support: Single extremity supported;Bilateral upper extremity supported;During functional activity Standing balance-Leahy Scale: Poor Standing balance comment: min assist to mod assist +2 dynamically                            Cognition Arousal/Alertness: Awake/alert Behavior During Therapy: Flat affect Overall Cognitive Status: Impaired/Different from baseline Area of Impairment: Attention;Memory;Following commands;Safety/judgement;Awareness;Problem solving                   Current Attention Level: Sustained Memory: Decreased short-term memory Following Commands: Follows one step commands consistently;Follows one step commands with increased time Safety/Judgement: Decreased awareness of safety;Decreased awareness of deficits Awareness: Intellectual Problem Solving: Slow processing;Decreased initiation;Difficulty sequencing;Requires verbal cues;Requires tactile cues General Comments: patient alert today, oriented to situation and place (time not tested).  patient requires mulitmodal cueing  to follow commands, highly  distractable and impulsive at times.      Exercises General Exercises - Lower Extremity Long Arc Quad: Both;5 reps;Seated Hip Flexion/Marching: Both;10 reps;Seated;Standing Other Exercises Other Exercises: Engaged in L UE exercises with max cueing for sequencing and pacing--shoulder elevation and retraction (bilaterally), foward flexion and extension shoulder    General Comments General comments (skin integrity, edema, etc.): pt on RA with VSS      Pertinent Vitals/Pain Pain Assessment: Faces Faces Pain Scale: No hurt Pain Intervention(s): Monitored during session    Home Living                      Prior Function            PT Goals (current goals can now be found in the care plan section) Acute Rehab PT Goals Patient Stated Goal: none stated PT Goal Formulation: With patient/family Time For Goal Achievement: 02/09/21 Potential to Achieve Goals: Good Progress towards PT goals: Progressing toward goals    Frequency    Min 4X/week      PT Plan Current plan remains appropriate    Co-evaluation PT/OT/SLP Co-Evaluation/Treatment: Yes Reason for Co-Treatment: Complexity of the patient's impairments (multi-system involvement);Necessary to address cognition/behavior during functional activity;For patient/therapist safety;To address functional/ADL transfers PT goals addressed during session: Mobility/safety with mobility;Balance OT goals addressed during session: ADL's and self-care      AM-PAC PT "6 Clicks" Mobility   Outcome Measure  Help needed turning from your back to your side while in a flat bed without using bedrails?: Total Help needed moving from lying on your back to sitting on the side of a flat bed without using bedrails?: Total Help needed moving to and from a bed to a chair (including a wheelchair)?: Total Help needed standing up from a chair using your arms (e.g., wheelchair or bedside chair)?: Total Help needed to walk in hospital room?:  Total Help needed climbing 3-5 steps with a railing? : Total 6 Click Score: 6    End of Session Equipment Utilized During Treatment: Gait belt Activity Tolerance: Patient tolerated treatment well Patient left: in bed;with call bell/phone within reach;with bed alarm set Nurse Communication: Mobility status PT Visit Diagnosis: Unsteadiness on feet (R26.81);Other abnormalities of gait and mobility (R26.89);Muscle weakness (generalized) (M62.81);Difficulty in walking, not elsewhere classified (R26.2);Other symptoms and signs involving the nervous system (R29.898);Hemiplegia and hemiparesis Hemiplegia - Right/Left: Left Hemiplegia - caused by: Cerebral infarction     Time: 1345-1420 PT Time Calculation (min) (ACUTE ONLY): 35 min  Charges:  $Therapeutic Activity: 8-22 mins                     Leonard [name] A. Dan Humphreys PT, DPT Acute Rehabilitation Services Pager [phone] Office [phone]    Viviann Spare 02/08/2021, 5:22 PM

## 2021-02-08 NOTE — Progress Notes (Addendum)
PROGRESS NOTE  Leonard [name] BTD:[id number] DOB: November 08, 1955 DOA: 01/24/2021 PCP: Pcp, No   LOS: 14 days   Brief Narrative / Interim history:  65 year old male with no significant history came into the hospital with left-sided weakness, left facial droop and dysarthria.  He was found to have an acute right MCA territory infarct status post tPA and bilateral common carotid arteriograms followed by right MCA/ACA/ICA thrombus retrieval with stent angioplasty.  He was admitted in the neuro ICU initially.  Hospitalist service was consulted on 4/29 due to increased work of breathing, he was diagnosed with Covid  Subjective / 24h Interval events:  Physical therapy is in the room.  He seems alert, following commands but does not speak much.  He can tell me his name, has significant slurred speech  Assessment & Plan:  Acute hypoxic respiratory failure due to Covid 19 pneumonia, possible aspiration pneumonia with sepsis physiology  -patient has been having increased work of breathing for few days per wife but on 4/29 much more so and hospitalist service was consulted.  He was placed on antibiotics due to concern for aspiration given poor mental status. -Chest x-ray with developing multifocal pneumonia.  COVID was sent 4/30 and returned positive -Status post Actemra on 4/30, also placed on Remdesivir along with steroids.  His respiratory status seems to be improving, he is now stable , Down to 2 L today. -Procalcitonin relatively low, but cannot fully rule out ongoing aspiration.  Completed 5 days of antibiotics with Unasyn, discontinued today. - unfortunately he is unvaccinated -Inflammatory markers improving -Patient with an episode of rapid response yesterday, encephalopathic, minimally responsive, CT head with no acute findings, EEG significant for encephalopathy, but no seizures, ABG with no CO2 retention, mentation back to baseline today.  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  His MRI on admission showed large acute infarct of the right MCA territory involving the frontal operculum, insula and basal ganglia with hemorrhagic conversion.  A 2D echo showed EF 60-65%, mild LVH.  LDL is 126.  A1c is 5.8.  He is currently on aspirin and Brilinta.  Has a core track for feeding and family agrees to PEG but will need to get over Covid first. -Repeat CT scan stable -PT recommends CIR, will need to be off isolation -He will need PEG tube once more stable, likely will need to transition from Brilinta to heparin GTT around PEG tube insertion time as discussed with neurology/neuro interventional.  Cerebral edema  -goal sodium 145-150.  Improving with free water via NG tube and D5W, sodium is down to 145 today, will DC D5W.    Essential hypertension -continue amlodipine, labetalol  Hyperlipidemia -continue statin  Dysphagia -due to stroke, has a core track, continue tube feeds.  Family wants PEG, will hold on requesting PEG given active COVID-19 infection, as well patient remains on Brilinta, will discuss with IR when patient is off COVID isolation if it can be done while patient on Brilinta.   Scheduled Meds: . vitamin C  500 mg Per Tube Daily  . aspirin  81 mg Oral Daily   Or  . aspirin  81  mg Per Tube Daily  . atorvastatin  80 mg Per Tube Daily  . chlorhexidine  15 mL Mouth Rinse BID  . Chlorhexidine Gluconate Cloth  6 each Topical Daily  . feeding supplement (PROSource TF)  45 mL Per Tube TID  . free water  400 mL Per Tube Q4H  . heparin injection (subcutaneous)  5,000 Units Subcutaneous Q8H  . mouth rinse  15 mL Mouth Rinse  [id number]  . pantoprazole sodium  40 mg Per Tube Daily  . predniSONE  50 mg Per Tube Daily  . senna-docusate  1 tablet Per Tube Daily  . ticagrelor  90 mg Oral BID   Or  . ticagrelor  90 mg Per Tube BID  . zinc sulfate  220 mg Oral Daily   Continuous Infusions: . feeding supplement (OSMOLITE 1.5 CAL) 1,000 mL (02/08/21 1034)   PRN Meds:.acetaminophen **OR** acetaminophen (TYLENOL) oral liquid 160 mg/5 mL **OR** acetaminophen, albuterol, labetalol  Diet Orders (From admission, onward)    Start     Ordered   01/25/21 0300  Diet NPO time specified  Diet effective now        01/25/21 0259          DVT prophylaxis: heparin injection 5,000 Units Start: 02/07/21 1400 SCD's Start: 01/25/21 0006, will start on  heparin, no contraindication as discussed with neurology Dr. Roda Shutters    Code Status: Full Code  Family Communication: None at bedside  Status is: Inpatient  Remains inpatient appropriate because:Inpatient level of care appropriate due to severity of illness   Dispo: The patient is from: Home              Anticipated d/c is to: CIR              Patient currently is not medically stable to d/c.   Difficult to place patient No   Level of care: Progressive  Consultants:  Neurology   Procedures:  2D echo  Microbiology  1/4 bottles-staph epi blood cultures 4/28 SARS-CoV-2 + on 4/30  Antimicrobials: Cefepime 4/30 >> 5/1 Unasyn 5/1 >> 5/3 Remdesivir 4/30 >>  Objective: Vitals:   02/07/21 2050 02/07/21 2344 02/08/21 0448 02/08/21 1203  BP: 126/69 135/75 (!) 131/58 114/75  Pulse: 66 72 68 62  Resp: (!) 22 15 20 20   Temp: 98 F (36.7 C) 98.2 F (36.8 C) 97.7 F (36.5 C)   TempSrc: Oral Axillary Axillary   SpO2: 95% 99% 96% 98%  Weight:      Height:        Intake/Output Summary (Last 24 hours) at 02/08/2021 1544 Last data filed at 02/08/2021 0450 Gross per 24 hour  Intake --  Output 1800 ml  Net -1800 ml   Filed Weights   01/31/21 0400 02/03/21 0500 02/07/21  0336  Weight: 79.7 kg 79 kg 77.7 kg    Examination:  Awake, fluctuating mental status, but open eyes and follow simple commands, but remains easily distracted and confused Symmetrical Chest wall movement, Good air movement bilaterally, CTAB RRR,No Gallops,Rubs or new Murmurs, No Parasternal Heave +ve B.Sounds, Abd Soft, No tenderness, No rebound - guarding or rigidity. No Cyanosis, Clubbing or edema, remains with significant weakness in the left side   Data Reviewed: I have independently reviewed following labs and imaging studies   CBC: Recent Labs  Lab 02/04/21 0300 02/05/21 0346 02/06/21 0526 02/07/21 0234 02/08/21 0347  WBC 5.4 4.2 4.8 7.4 8.5  NEUTROABS 4.5 3.4 2.3 4.8 5.4  HGB 9.9* 9.9* 9.3* 9.6*  9.6*  HCT 32.3* 31.7* 30.7* 31.3* 29.9*  MCV 96.1 93.8 96.2 94.6 92.9  PLT 423* 413* 392 389 386   Basic Metabolic Panel: Recent Labs  Lab 02/04/21 0300 02/05/21 0346 02/06/21 0526 02/07/21 0234 02/08/21 0347  NA 154* 156* 152* 145 140  K 3.8 4.1 3.6 3.6 3.5  CL 120* 125* 122* 118* 109  CO2 22 22 24 24 24   GLUCOSE 159* 169* 160* 156* 122*  BUN 33* 33* 27* 23 21  CREATININE 0.86 0.93 0.87 0.79 0.71  CALCIUM 8.6* 8.5* 8.0* 8.0* 7.9*  MG 2.9* 2.8* 2.7* 2.3 2.3   Liver Function Tests: Recent Labs  Lab 02/04/21 0300 02/05/21 0346 02/06/21 0526 02/07/21 0234 02/08/21 0347  AST 72* 93* 88* 75* 63*  ALT 71* 95* 107* 105* 94*  ALKPHOS 168* 146* 120 111 100  BILITOT 1.4* 1.0 1.0 0.9 1.0  PROT 6.6 6.3* 5.5* 5.5* 5.2*  ALBUMIN 2.5* 2.4* 2.3* 2.5* 2.4*   Coagulation Profile: No results for input(s): INR, PROTIME in the last 168 hours. HbA1C: No results for input(s): HGBA1C in the last 72 hours. CBG: Recent Labs  Lab 02/07/21 2036 02/08/21 0034 02/08/21 0446 02/08/21 0902 02/08/21 1203  GLUCAP 94 138* 131* 100* 99    Recent Results (from the past 240 hour(s))  Culture, Urine     Status: None   Collection Time: 02/01/21  2:06 PM   Specimen: Urine,  Random  Result Value Ref Range Status   Specimen Description URINE, RANDOM  Final   Special Requests NONE  Final   Culture   Final    NO GROWTH Performed at [facility], [address] [place], [place] [address]    Report Status 02/02/2021 FINAL  Final  Culture, blood (routine x 2)     Status: None   Collection Time: 02/01/21  5:17 PM   Specimen: BLOOD RIGHT ARM  Result Value Ref Range Status   Specimen Description BLOOD RIGHT ARM  Final   Special Requests   Final    BOTTLES DRAWN AEROBIC ONLY Blood Culture results [place] not be optimal due to an inadequate volume of blood received in culture bottles   Culture   Final    NO GROWTH 5 DAYS Performed at [facility], [address] [place], [place] [address]    Report Status 02/06/2021 FINAL  Final  Culture, blood (routine x 2)     Status: Abnormal   Collection Time: 02/01/21  5:17 PM   Specimen: BLOOD LEFT HAND  Result Value Ref Range Status   Specimen Description BLOOD LEFT HAND  Final   Special Requests   Final    BOTTLES DRAWN AEROBIC ONLY Blood Culture results [place] not be optimal due to an inadequate volume of blood received in culture bottles   Culture  Setup Time   Final    GRAM POSITIVE COCCI IN CLUSTERS AEROBIC BOTTLE ONLY CRITICAL RESULT CALLED TO, READ BACK BY AND VERIFIED WITH: J CARNEY [facility] 02/02/21 2137 JDW    Culture (A)  Final    STAPHYLOCOCCUS EPIDERMIDIS STAPHYLOCOCCUS CAPITIS THE SIGNIFICANCE OF ISOLATING THIS ORGANISM FROM A SINGLE SET OF BLOOD CULTURES WHEN MULTIPLE SETS ARE DRAWN IS UNCERTAIN. PLEASE NOTIFY THE MICROBIOLOGY DEPARTMENT WITHIN ONE WEEK IF SPECIATION AND SENSITIVITIES ARE REQUIRED. Performed at [facility], [address] [place], [place] [address]    Report Status 02/05/2021 FINAL  Final  Blood Culture ID Panel (Reflexed)     Status: Abnormal  Collection Time: 02/01/21  5:17 PM  Result Value Ref Range Status   Enterococcus faecalis NOT DETECTED NOT DETECTED Final    Enterococcus Faecium NOT DETECTED NOT DETECTED Final   Listeria monocytogenes NOT DETECTED NOT DETECTED Final   Staphylococcus species DETECTED (A) NOT DETECTED Final    Comment: CRITICAL RESULT CALLED TO, READ BACK BY AND VERIFIED WITH: J CARNEY PHARMD 02/02/21 2137 JDW    Staphylococcus aureus (BCID) NOT DETECTED NOT DETECTED Final   Staphylococcus epidermidis DETECTED (A) NOT DETECTED Final    Comment: Methicillin (oxacillin) resistant coagulase negative staphylococcus. Possible blood culture contaminant (unless isolated from more than one blood culture draw or clinical case suggests pathogenicity). No antibiotic treatment is indicated for blood  culture contaminants. CRITICAL RESULT CALLED TO, READ BACK BY AND VERIFIED WITH: J CARNEY PHARMD 02/02/21 2137 JDW    Staphylococcus lugdunensis NOT DETECTED NOT DETECTED Final   Streptococcus species NOT DETECTED NOT DETECTED Final   Streptococcus agalactiae NOT DETECTED NOT DETECTED Final   Streptococcus pneumoniae NOT DETECTED NOT DETECTED Final   Streptococcus pyogenes NOT DETECTED NOT DETECTED Final   A.calcoaceticus-baumannii NOT DETECTED NOT DETECTED Final   Bacteroides fragilis NOT DETECTED NOT DETECTED Final   Enterobacterales NOT DETECTED NOT DETECTED Final   Enterobacter cloacae complex NOT DETECTED NOT DETECTED Final   Escherichia coli NOT DETECTED NOT DETECTED Final   Klebsiella aerogenes NOT DETECTED NOT DETECTED Final   Klebsiella oxytoca NOT DETECTED NOT DETECTED Final   Klebsiella pneumoniae NOT DETECTED NOT DETECTED Final   Proteus species NOT DETECTED NOT DETECTED Final   Salmonella species NOT DETECTED NOT DETECTED Final   Serratia marcescens NOT DETECTED NOT DETECTED Final   Haemophilus influenzae NOT DETECTED NOT DETECTED Final   Neisseria meningitidis NOT DETECTED NOT DETECTED Final   Pseudomonas aeruginosa NOT DETECTED NOT DETECTED Final   Stenotrophomonas maltophilia NOT DETECTED NOT DETECTED Final   Candida  albicans NOT DETECTED NOT DETECTED Final   Candida auris NOT DETECTED NOT DETECTED Final   Candida glabrata NOT DETECTED NOT DETECTED Final   Candida krusei NOT DETECTED NOT DETECTED Final   Candida parapsilosis NOT DETECTED NOT DETECTED Final   Candida tropicalis NOT DETECTED NOT DETECTED Final   Cryptococcus neoformans/gattii NOT DETECTED NOT DETECTED Final   Methicillin resistance mecA/C DETECTED (A) NOT DETECTED Final    Comment: CRITICAL RESULT CALLED TO, READ BACK BY AND VERIFIED WITH: Arnaldo Natal [facility] 02/02/21 2137 JDW Performed at [facility], [address], [place], [place] [address]   MRSA PCR Screening     Status: None   Collection Time: 02/02/21 11:30 PM   Specimen: Nasal Mucosa; Nasopharyngeal  Result Value Ref Range Status   MRSA by PCR NEGATIVE NEGATIVE Final    Comment:        The GeneXpert MRSA Assay (FDA approved for NASAL specimens only), is one component of a comprehensive MRSA colonization surveillance program. It is not intended to diagnose MRSA infection nor to guide or monitor treatment for MRSA infections. Performed at [facility], [address] [place], [place] [address]   Resp Panel by RT-PCR (Flu A&B, Covid) Nasopharyngeal Swab     Status: Abnormal   Collection Time: 02/03/21  9:38 AM   Specimen: Nasopharyngeal Swab; Nasopharyngeal(NP) swabs in vial transport medium  Result Value Ref Range Status   SARS Coronavirus 2 by RT PCR POSITIVE (A) NEGATIVE Final    Comment: RESULT CALLED TO, READ BACK BY AND VERIFIED WITH: A,PENNINGTON RN  02/03/21  EB (NOTE) SARS-CoV-2 target nucleic acids are DETECTED.  The SARS-CoV-2 RNA is generally detectable in upper respiratory specimens during the acute phase of infection. Positive results are indicative of the presence of the identified virus, but do not rule out bacterial infection or co-infection with other pathogens not detected by the test. Clinical correlation with patient history  and other diagnostic information is necessary to determine patient infection status. The expected result is Negative.  Fact Sheet for Patients: BloggerCourse.com  Fact Sheet for Healthcare Providers: SeriousBroker.it  This test is not yet approved or cleared by the [place] FDA and  has been authorized for detection and/or diagnosis of SARS-CoV-2 by FDA under an Emergency Use Authorization (EUA).  This EUA will remain in effect (meaning this test can be  used) for the duration of  the COVID-19 declaration under Section 564(b)(1) of the Act, 21 U.S.C. section 360bbb-3(b)(1), unless the authorization is terminated or revoked sooner.     Influenza A by PCR NEGATIVE NEGATIVE Final   Influenza B by PCR NEGATIVE NEGATIVE Final    Comment: (NOTE) The Xpert Xpress SARS-CoV-2/FLU/RSV plus assay is intended as an aid in the diagnosis of influenza from Nasopharyngeal swab specimens and should not be used as a sole basis for treatment. Nasal washings and aspirates are unacceptable for Xpert Xpress SARS-CoV-2/FLU/RSV testing.  Fact Sheet for Patients: BloggerCourse.com  Fact Sheet for Healthcare Providers: SeriousBroker.it  This test is not yet approved or cleared by the [place] FDA and has been authorized for detection and/or diagnosis of SARS-CoV-2 by FDA under an Emergency Use Authorization (EUA). This EUA will remain in effect (meaning this test can be used) for the duration of the COVID-19 declaration under Section 564(b)(1) of the Act, 21 U.S.C. section 360bbb-3(b)(1), unless the authorization is terminated or revoked.  Performed at [facility], [address], [place], [place] [address]      Radiology Studies: EEG adult  Result Date: 03-07-21 Charlsie Quest, MD     03-07-21  5:59 PM Patient Name: Leonard [name] MRN: [id number] Epilepsy Attending: Charlsie Quest Referring Physician/Provider: Dr Huey Bienenstock Date: Mar 07, 2021 Duration: 23.14 mins Patient history: 65yo M with right MCA infarct and ams. EEG to evaluate for seizure Level of alertness: Awake AEDs during EEG study: None Technical aspects: This EEG study was done with scalp electrodes positioned according to the 10-20 International system of electrode placement. Electrical activity was acquired at a sampling rate of 500Hz  and reviewed with a high frequency filter of 70Hz  and a low frequency filter of 1Hz . EEG data were recorded continuously and digitally stored. Description: The posterior dominant rhythm consists of 8 Hz activity of moderate voltage (25-35 uV) seen predominantly in posterior head regions, asymmetric( R<L) and reactive to eye opening and eye closing. EEG showed continuous generalized and lateralized right hemisphere 3 to 6 Hz theta-delta slowing.  Hyperventilation and photic stimulation were not performed.   ABNORMALITY - Continuous slow, generalized and lateralized right hemisphere IMPRESSION: This study is suggestive of cortical dysfunction arising from right hemisphere likely secondary to underlying structural abnormality/stroke. Additionally, there is mild diffuse encephalopathy, nonspecific etiology. No seizures or epileptiform discharges were seen throughout the recording. Priyanka Elvera Almario MD [facility]  Between 7 am - 7 pm I am available, please contact me via Amion (for emergencies) or Securechat (non urgent messages)  Between 7 pm - 7 am I am not available, please contact night coverage MD/APP via Amion

## 2021-02-09 ENCOUNTER — Inpatient Hospital Stay (HOSPITAL_COMMUNITY): Payer: Medicare HMO

## 2021-02-09 LAB — GLUCOSE, CAPILLARY
Glucose-Capillary: 106 mg/dL — ABNORMAL HIGH (ref 70–99)
Glucose-Capillary: 107 mg/dL — ABNORMAL HIGH (ref 70–99)
Glucose-Capillary: 130 mg/dL — ABNORMAL HIGH (ref 70–99)
Glucose-Capillary: 152 mg/dL — ABNORMAL HIGH (ref 70–99)
Glucose-Capillary: 90 mg/dL (ref 70–99)
Glucose-Capillary: 96 mg/dL (ref 70–99)

## 2021-02-09 NOTE — Progress Notes (Signed)
Inpatient Rehab Admissions Coordinator:    Pt. Continues on airborne/contact precautions for COVID-19. Patients are eligible to be considered for admit to the [facility] when cleared from airborne precautions by Acute MD, or Infectious Disease MD. Otherwise, they will need to be >20 days from their positive test with recovery/improvement in symptoms or 2 negative tests. Will  Continue to follow from a distance for now.  Megan Salon, MS, CCC-SLP Rehab Admissions Coordinator  [phone] (celll) [phone] (office)

## 2021-02-09 NOTE — Progress Notes (Signed)
Occupational Therapy Treatment Patient Details Name: Leonard [name] MRN: [id number] DOB: [birth year] Today's Date: 02/09/2021    History of present illness 65 y.o. male who presented 4/20 with acute onset L hemiplegia, L facial droop, dysarthria, and L hemineglect. tPA was administered. 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 S/p bil common carotid arteriograms and revascularization of occluded R MCA and R ACA prox and R ICA terminus. ETT 4/20-4/21. COVID + 4/30. No significant PMH.   OT comments  Patient supine in bed and agreeable to OT/PT session.  Patient remains impulsive and highly distractible, requires cueing to attend to L UE and L side of environment.  He requires 2+ min assist for transfers and 2+ mod assist for limited in room mobility.  Stood at sink to wash hands with mod assist, fatiguing with sustained standing and completed task sitting in chair.  While sitting, incontinent of bladder and bowel requiring total assist for toileting clean up.  Patient with poor awareness of deficits and safety.  Engaged in L UE exercises with max cueing for attention to task and L UE, when attending to UE demonstrating approx 45* FF. Recommend resting hand splint to L UE for contracture prevention and prolonged overnight stretch--will place order.  Will best benefit from CIR to optimize independence and safety.    Follow Up Recommendations  CIR;Supervision/Assistance - 24 hour    Equipment Recommendations  None recommended by OT    Recommendations for Other Services Rehab consult    Precautions / Restrictions Precautions Precautions: Fall Precaution Comments: L neglect, R mitt Restrictions Weight Bearing Restrictions: No       Mobility Bed Mobility Overal bed mobility:  Needs Assistance Bed Mobility: Supine to Sit;Sit to Supine     Supine to sit: Max assist;HOB elevated Sit to supine: Min assist   General bed mobility comments: to EOB with max assist for trunk support and sequencing, returned to supine with min assist for safety    Transfers Overall transfer level: Needs assistance Equipment used: 2 person hand held assist Transfers: Sit to/from Stand Sit to Stand: Min assist;+2 physical assistance;+2 safety/equipment         General transfer comment: min assist +2 to power up and steady from EOB, cueing for hand placement and sequencing    Balance Overall balance assessment: Needs assistance Sitting-balance support: Feet supported;No upper extremity supported Sitting balance-Leahy Scale: Fair Sitting balance - Comments: close supervision for safety statically but up to mod assist dynamically when attempting to don socks at EOB Postural control: Posterior lean Standing balance support: No upper extremity supported;Single extremity supported;During functional activity Standing balance-Leahy Scale: Poor Standing balance comment: min assist to mod assist +2 dynamically                           ADL either performed or assessed with clinical judgement   ADL Overall ADL's : Needs assistance/impaired     Grooming: Moderate assistance;Wash/dry hands;Standing Grooming Details (indicate cue type and reason): standing at sink with mod assist to wash R UE, support L UE while pt washing and min guard to maintain balance             Lower Body Dressing: Maximal assistance;Sit to/from stand Lower Body Dressing Details (indicate cue type and  reason): able to don R sock with min assist but requires mod assist for L sock; up to mod assist to maintain dynamic balance Toilet Transfer: Minimal assistance;+2 for physical assistance;+2 for safety/equipment;Ambulation Toilet Transfer Details (indicate cue type and reason): simulated in  room Toileting- Clothing Manipulation and Hygiene: Total assistance;+2 for physical assistance;+2 for safety/equipment;Sit to/from stand Toileting - Clothing Manipulation Details (indicate cue type and reason): pt incontinent of bowel while sitting at sink, urinating through condom cath     Functional mobility during ADLs: Minimal assistance;Moderate assistance;+2 for physical assistance;+2 for safety/equipment;Cueing for sequencing;Cueing for safety General ADL Comments: pt limited by balance, L sided hemi and neglect, decreased activity tolerance and cognition     Vision   Additional Comments: requires cueing to scan towards L side to locate soap at sink   Perception     Praxis      Cognition Arousal/Alertness: Awake/alert Behavior During Therapy: Flat affect Overall Cognitive Status: Impaired/Different from baseline Area of Impairment: Attention;Memory;Following commands;Safety/judgement;Awareness;Problem solving                   Current Attention Level: Focused Memory: Decreased recall of precautions;Decreased short-term memory Following Commands: Follows one step commands consistently;Follows one step commands with increased time Safety/Judgement: Decreased awareness of safety;Decreased awareness of deficits Awareness: Intellectual Problem Solving: Slow processing;Decreased initiation;Difficulty sequencing;Requires verbal cues;Requires tactile cues General Comments: pt requires max mulitmodal cueing at times to follow simple commands, poor attention, safety and awareness to deficits. highly distractable today.        Exercises Exercises: General Upper Extremity (with mulimodal cueing, tapping and max cueing to attend to task) General Exercises - Upper Extremity Shoulder Flexion: AAROM;Left;10 reps;Seated Shoulder Extension: AAROM;Left;10 reps;Seated Other Exercises Other Exercises: PROM to L UE from shoulder to hand, increased tone (minimal) in hand and would  benefit from resting hand splint   Shoulder Instructions       General Comments on RA with VSS    Pertinent Vitals/ Pain       Pain Assessment: Faces Faces Pain Scale: No hurt Pain Intervention(s): Monitored during session  Home Living                                          Prior Functioning/Environment              Frequency  Min 2X/week        Progress Toward Goals  OT Goals(current goals can now be found in the care plan section)  Progress towards OT goals: Progressing toward goals  Acute Rehab OT Goals Patient Stated Goal: to get better/ get out of here OT Goal Formulation: With patient/family  Plan Discharge plan remains appropriate;Frequency remains appropriate    Co-evaluation    PT/OT/SLP Co-Evaluation/Treatment: Yes Reason for Co-Treatment: Complexity of the patient's impairments (multi-system involvement);Necessary to address cognition/behavior during functional activity;For patient/therapist safety;To address functional/ADL transfers   OT goals addressed during session: ADL's and self-care      AM-PAC OT "6 Clicks" Daily Activity     Outcome Measure   Help from another person eating meals?: Total Help from another person taking care of personal grooming?: A Lot Help from another person toileting, which includes using toliet, bedpan, or urinal?: Total Help from another person bathing (including washing, rinsing, drying)?: A Lot Help from another person to put on and taking off regular upper body clothing?: A Lot Help  from another person to put on and taking off regular lower body clothing?: A Lot 6 Click Score: 10    End of Session Equipment Utilized During Treatment: Gait belt  OT Visit Diagnosis: Unsteadiness on feet (R26.81);Cognitive communication deficit (R41.841) Symptoms and signs involving cognitive functions: Cerebral infarction   Activity Tolerance Patient tolerated treatment well   Patient Left in bed;with  call bell/phone within reach;with bed alarm set   Nurse Communication Mobility status        Time: 1610-9604 OT Time Calculation (min): 40 min  Charges: OT General Charges $OT Visit: 1 Visit OT Treatments $Self Care/Home Management : 23-37 mins  Barry Brunner, OT Acute Rehabilitation Services Pager [phone] Office [phone]    Chancy Milroy 02/09/2021, 4:02 PM

## 2021-02-09 NOTE — Progress Notes (Signed)
Physical Therapy Treatment Patient Details Name: Leonard [name] MRN: [id number] DOB: 03-22-1956 Today's Date: 02/09/2021    History of Present Illness 65 y.o. male who presented 4/20 with acute onset L hemiplegia, L facial droop, dysarthria, and L hemineglect. tPA was administered. 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 S/p bil common carotid arteriograms and revascularization of occluded R MCA and R ACA prox and R ICA terminus. ETT 4/20-4/21. COVID + 4/30. No significant PMH.    PT Comments    Patient continues to be limited by impaired cognition, impaired balance, decreased activity tolerance, and L sided weakness. Patient impulsive and easily distracted throughout session. Patient requires minA+2 for transfers and modA+2 for ambulation with HHAx2 for short distance. Able to stand at sink washing hands with modA however fatigued and required seated rest break. While sitting, patient incontinent of bowel and bladder requiring totalA for pericare and modA to maintain static standing during clean up. Continue to recommend comprehensive inpatient rehab (CIR) for post-acute therapy needs.     Follow Up Recommendations  CIR;Supervision/Assistance - 24 hour     Equipment Recommendations  3in1 (PT);Wheelchair (measurements PT);Wheelchair cushion (measurements PT);Other (comment)    Recommendations for Other Services       Precautions / Restrictions Precautions Precautions: Fall Precaution Comments: L neglect, R mitt Restrictions Weight Bearing Restrictions: No    Mobility  Bed Mobility Overal bed mobility: Needs Assistance Bed Mobility: Supine to Sit;Sit to Supine     Supine to sit: Max assist;HOB elevated Sit to supine: Min assist   General bed mobility comments:  to EOB with max assist for trunk support and sequencing, returned to supine with min assist for safety    Transfers Overall transfer level: Needs assistance Equipment used: 2 person hand held assist Transfers: Sit to/from Stand Sit to Stand: Min assist;+2 physical assistance;+2 safety/equipment         General transfer comment: min assist +2 to power up and steady from EOB, cueing for hand placement and sequencing  Ambulation/Gait Ambulation/Gait assistance: Mod assist;+2 physical assistance;+2 safety/equipment Gait Distance (Feet): 6 Feet (x6') Assistive device: 2 person hand held assist Gait Pattern/deviations: Narrow base of support;Decreased stride length;Step-to pattern;Shuffle Gait velocity: decr   General Gait Details: modA+2 for balance throughout ambulation. Decreaed weight shift to L requiring assist for stepping   Stairs             Wheelchair Mobility    Modified Rankin (Stroke Patients Only) Modified Rankin (Stroke Patients Only) Pre-Morbid Rankin Score: No symptoms Modified Rankin: Moderately severe disability     Balance Overall balance assessment: Needs assistance Sitting-balance support: Feet supported;No upper extremity supported Sitting balance-Leahy Scale: Fair Sitting balance - Comments: close supervision for safety statically but up to mod assist dynamically when attempting to don socks at EOB Postural control: Posterior lean Standing balance support: No upper extremity supported;Single extremity supported;During functional activity Standing balance-Leahy Scale: Poor Standing balance comment: min assist to mod assist +2 dynamically                            Cognition Arousal/Alertness: Awake/alert Behavior During Therapy: Flat affect Overall Cognitive Status: Impaired/Different from baseline Area of Impairment: Attention;Memory;Following commands;Safety/judgement;Awareness;Problem solving  Current  Attention Level: Focused Memory: Decreased recall of precautions;Decreased short-term memory Following Commands: Follows one step commands consistently;Follows one step commands with increased time Safety/Judgement: Decreased awareness of safety;Decreased awareness of deficits Awareness: Intellectual Problem Solving: Slow processing;Decreased initiation;Difficulty sequencing;Requires verbal cues;Requires tactile cues General Comments: pt requires max mulitmodal cueing at times to follow simple commands, poor attention, safety and awareness to deficits. highly distractable today.      Exercises General Exercises - Upper Extremity Shoulder Flexion: AAROM;Left;10 reps;Seated Shoulder Extension: AAROM;Left;10 reps;Seated Other Exercises Other Exercises: PROM to L UE from shoulder to hand, increased tone (minimal) in hand and would benefit from resting hand splint    General Comments General comments (skin integrity, edema, etc.): on RA with VSS      Pertinent Vitals/Pain Pain Assessment: Faces Faces Pain Scale: No hurt Pain Intervention(s): Monitored during session    Home Living                      Prior Function            PT Goals (current goals can now be found in the care plan section) Acute Rehab PT Goals Patient Stated Goal: to get better/ get out of here PT Goal Formulation: With patient/family Time For Goal Achievement: 02/23/21 Potential to Achieve Goals: Fair Progress towards PT goals: Progressing toward goals    Frequency    Min 4X/week      PT Plan Current plan remains appropriate    Co-evaluation PT/OT/SLP Co-Evaluation/Treatment: Yes Reason for Co-Treatment: Complexity of the patient's impairments (multi-system involvement);Necessary to address cognition/behavior during functional activity;For patient/therapist safety;To address functional/ADL transfers PT goals addressed during session: Mobility/safety with mobility;Balance OT goals  addressed during session: ADL's and self-care      AM-PAC PT "6 Clicks" Mobility   Outcome Measure  Help needed turning from your back to your side while in a flat bed without using bedrails?: Total Help needed moving from lying on your back to sitting on the side of a flat bed without using bedrails?: Total Help needed moving to and from a bed to a chair (including a wheelchair)?: Total Help needed standing up from a chair using your arms (e.g., wheelchair or bedside chair)?: Total Help needed to walk in hospital room?: Total Help needed climbing 3-5 steps with a railing? : Total 6 Click Score: 6    End of Session Equipment Utilized During Treatment: Gait belt Activity Tolerance: Patient tolerated treatment well Patient left: in bed;with call bell/phone within reach;with bed alarm set Nurse Communication: Mobility status PT Visit Diagnosis: Unsteadiness on feet (R26.81);Other abnormalities of gait and mobility (R26.89);Muscle weakness (generalized) (M62.81);Difficulty in walking, not elsewhere classified (R26.2);Other symptoms and signs involving the nervous system (R29.898);Hemiplegia and hemiparesis Hemiplegia - Right/Left: Left Hemiplegia - caused by: Cerebral infarction     Time: 1454-1535 PT Time Calculation (min) (ACUTE ONLY): 41 min  Charges:  $Therapeutic Activity: 8-22 mins                     Latonda Larrivee A. Dan Humphreys PT, DPT Acute Rehabilitation Services Pager [phone] Office [phone]    Viviann Spare 02/09/2021, 5:00 PM

## 2021-02-09 NOTE — Progress Notes (Signed)
Orthopedic Tech Progress Note Patient Details:  Leonard [name] Aug 19, 1956 [id number] Placed order for Resting Hand Splint to [facility]  Patient ID: Gerrit Friends Wickard, male   DOB: 02/08/1956, 65 y.o.   MRN: [id number]   Maurene Capes 02/09/2021, 5:27 PM

## 2021-02-09 NOTE — Progress Notes (Signed)
PROGRESS NOTE  Leonard [name] MCN:[id number] DOB: 1955-10-13 DOA: 01/24/2021 PCP: Pcp, No   LOS: 15 days   Brief Narrative / Interim history:  65 year old male with no significant history came into the hospital with left-sided weakness, left facial droop and dysarthria.  He was found to have an acute right MCA territory infarct status post tPA and bilateral common carotid arteriograms followed by right MCA/ACA/ICA thrombus retrieval with stent angioplasty.  He was admitted in the neuro ICU initially.  Hospitalist service was consulted on 4/29 due to increased work of breathing, he was diagnosed with Covid  Subjective / 24h Interval events:  Patient had an episode of bradycardia yesterday, requiring home health, home with no recurrence since, is having good bowel movements.  Assessment & Plan:  Acute hypoxic respiratory failure due to Covid 19 pneumonia, possible aspiration pneumonia with sepsis physiology  -patient has been having increased work of breathing for few days per wife but on 4/29 much more so and hospitalist service was consulted.  He was placed on antibiotics due to concern for aspiration given poor mental status. -Chest x-ray with developing multifocal pneumonia.  COVID was sent 4/30 and returned positive -Status post Actemra on 4/30, also placed on Remdesivir along with steroids.  His respiratory status seems to be improving, he is now stable ,  he is on room air this morning -Procalcitonin relatively low, but cannot fully rule out ongoing aspiration.  Completed 5 days of antibiotics with Unasyn, discontinued today. - unfortunately he is unvaccinated -Inflammatory markers improving -Patient has fluctuating mental status due to CVA, recent work-up 5/4 and then with no acute findings EKG, REPEAT CTA HEAD AND EEG. -Patient will moderate COVID-19 infection, maximum oxygen requirement 6 L, he is currently on room air, will need isolation for total of 10 days as discussed with  ID.  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  His MRI on admission showed large acute infarct of the right MCA territory involving the frontal operculum, insula and basal ganglia with hemorrhagic conversion.  A 2D echo showed EF 60-65%, mild LVH.  LDL is 126.  A1c is 5.8.  He is currently on aspirin and Brilinta.  Has a core track for feeding and family agrees to PEG but will need to get over Covid first. -Repeat CT scan stable -PT recommends CIR, will need to be off isolation -He will need PEG tube once more stable, likely will need to transition from Brilinta to heparin GTT around PEG tube insertion time as discussed with neurology.  Cerebral edema  -I have discussed with neurology, patient is so far out of his acute stroke multiple, can keep sodium within current limits currently.  .  Essential hypertension -continue amlodipine, labetalol  Hyperlipidemia -continue statin  Dysphagia -due to stroke, has a core track, continue tube feeds.  Family wants PEG, will hold on requesting PEG given active COVID-19 infection, as well patient remains on Brilinta, will discuss with IR when patient is off COVID isolation if it can be done while patient on Brilinta.   Scheduled Meds: . vitamin C  500 mg Per Tube Daily  . aspirin  81 mg Oral Daily  Or  . aspirin  81 mg Per Tube Daily  . atorvastatin  80 mg Per Tube Daily  . chlorhexidine  15 mL Mouth Rinse BID  . Chlorhexidine Gluconate Cloth  6 each Topical Daily  . feeding supplement (PROSource TF)  45 mL Per Tube TID  . free water  300 mL Per Tube Q4H  . heparin injection (subcutaneous)  5,000 Units Subcutaneous Q8H  .  mouth rinse  15 mL Mouth Rinse q12n4p  . pantoprazole sodium  40 mg Per Tube Daily  . predniSONE  50 mg Per Tube Daily  . senna-docusate  1 tablet Per Tube Daily  . ticagrelor  90 mg Oral BID   Or  . ticagrelor  90 mg Per Tube BID  . zinc sulfate  220 mg Oral Daily   Continuous Infusions: . feeding supplement (OSMOLITE 1.5 CAL) 1,000 mL (02/08/21 1034)   PRN Meds:.acetaminophen **OR** acetaminophen (TYLENOL) oral liquid 160 mg/5 mL **OR** acetaminophen, albuterol, labetalol  Diet Orders (From admission, onward)    Start     Ordered   01/25/21 0300  Diet NPO time specified  Diet effective now        01/25/21 0259          DVT prophylaxis: heparin injection 5,000 Units Start: 02/07/21 1400 SCD's Start: 01/25/21 0006, will start on Cable heparin, no contraindication as discussed with neurology Dr. Roda Shutters    Code Status: Full Code  Family Communication: Discussed with wife by phone  Status is: Inpatient  Remains inpatient appropriate because:Inpatient level of care appropriate due to severity of illness   Dispo: The patient is from: Home              Anticipated d/c is to: CIR              Patient currently is not medically stable to d/c.   Difficult to place patient No   Level of care: Progressive  Consultants:  Neurology   Procedures:  2D echo  Microbiology  1/4 bottles-staph epi blood cultures 4/28 SARS-CoV-2 + on 4/30  Antimicrobials: Cefepime 4/30 >> 5/1 Unasyn 5/1 >> 5/3 Remdesivir 4/30 >>  Objective: Vitals:   02/08/21 1958 02/08/21 2300 02/09/21 0357 02/09/21 0800  BP: 134/80 (!) 122/107 (!) 168/77 (!) 147/93  Pulse: 78  67 88  Resp: 20  20 19   Temp: 98 F (36.7 C) 98.6 F (37 C) 98.4 F (36.9 C) 98.6 F (37 C)  TempSrc: Axillary Axillary Axillary Axillary  SpO2: 98%  95% 94%  Weight:   77.7 kg   Height:        Intake/Output Summary (Last 24 hours) at 02/09/2021 1132 Last data filed at 02/09/2021 0800 Gross per 24 hour  Intake 0 ml  Output 2250  ml  Net -2250 ml   Filed Weights   02/03/21 0500 02/07/21 0336 02/09/21 0357  Weight: 79 kg 77.7 kg 77.7 kg    Examination:  Sleeping comfortably, wakes up and opens eyes to verbal stimuli, follows simple commands, remains with significant left-sided weakness  Symmetrical Chest wall movement, Good air movement bilaterally, CTAB RRR,No Gallops,Rubs or new Murmurs, No Parasternal Heave +ve B.Sounds, Abd Soft, No tenderness, No rebound - guarding or rigidity. No Cyanosis, Clubbing or edema, No new Rash or bruise    Data Reviewed: I have independently reviewed following labs and imaging studies   CBC: Recent Labs  Lab 02/04/21 0300 02/05/21 0346 02/06/21 0526 02/07/21 0234 02/08/21 0347  WBC 5.4 4.2 4.8 7.4  8.5  NEUTROABS 4.5 3.4 2.3 4.8 5.4  HGB 9.9* 9.9* 9.3* 9.6* 9.6*  HCT 32.3* 31.7* 30.7* 31.3* 29.9*  MCV 96.1 93.8 96.2 94.6 92.9  PLT 423* 413* 392 389 386   Basic Metabolic Panel: Recent Labs  Lab 02/04/21 0300 02/05/21 0346 02/06/21 0526 02/07/21 0234 02/08/21 0347  NA 154* 156* 152* 145 140  K 3.8 4.1 3.6 3.6 3.5  CL 120* 125* 122* 118* 109  CO2 GLUCOSE 159* 169* 160* 156* 122*  BUN 33* 33* 27* 23 21  CREATININE 0.86 0.93 0.87 0.79 0.71  CALCIUM 8.6* 8.5* 8.0* 8.0* 7.9*  MG 2.9* 2.8* 2.7* 2.3 2.3   Liver Function Tests: Recent Labs  Lab 02/04/21 0300 02/05/21 0346 02/06/21 0526 02/07/21 0234 02/08/21 0347  AST 72* 93* 88* 75* 63*  ALT 71* 95* 107* 105* 94*  ALKPHOS 168* 146* 120 111 100  BILITOT 1.4* 1.0 1.0 0.9 1.0  PROT 6.6 6.3* 5.5* 5.5* 5.2*  ALBUMIN 2.5* 2.4* 2.3* 2.5* 2.4*   Coagulation Profile: No results for input(s): INR, PROTIME in the last 168 hours. HbA1C: No results for input(s): HGBA1C in the last 72 hours. CBG: Recent Labs  Lab 02/08/21 1653 02/08/21 1957 02/09/21 0016 02/09/21 0414 02/09/21 0759  GLUCAP 99 154* 107* 96 106*    Recent Results (from the past 240 hour(s))  Culture, Urine     Status: None    Collection Time: 02/01/21  2:06 PM   Specimen: Urine, Random  Result Value Ref Range Status   Specimen Description URINE, RANDOM  Final   Special Requests NONE  Final   Culture   Final    NO GROWTH Performed at [facility], [address] [place], [place] [address]    Report Status 02/02/2021 FINAL  Final  Culture, blood (routine x 2)     Status: None   Collection Time: 02/01/21  5:17 PM   Specimen: BLOOD RIGHT ARM  Result Value Ref Range Status   Specimen Description BLOOD RIGHT ARM  Final   Special Requests   Final    BOTTLES DRAWN AEROBIC ONLY Blood Culture results Tinkle not be optimal due to an inadequate volume of blood received in culture bottles   Culture   Final    NO GROWTH 5 DAYS Performed at [facility], [address], [place], [place] [address]    Report Status 02/06/2021 FINAL  Final  Culture, blood (routine x 2)     Status: Abnormal   Collection Time: 02/01/21  5:17 PM   Specimen: BLOOD LEFT HAND  Result Value Ref Range Status   Specimen Description BLOOD LEFT HAND  Final   Special Requests   Final    BOTTLES DRAWN AEROBIC ONLY Blood Culture results Burkel not be optimal due to an inadequate volume of blood received in culture bottles   Culture  Setup Time   Final    GRAM POSITIVE COCCI IN CLUSTERS AEROBIC BOTTLE ONLY CRITICAL RESULT CALLED TO, READ BACK BY AND VERIFIED WITH: J CARNEY [facility] 02/02/21 2137 JDW    Culture (A)  Final    STAPHYLOCOCCUS EPIDERMIDIS STAPHYLOCOCCUS CAPITIS THE SIGNIFICANCE OF ISOLATING THIS ORGANISM FROM A SINGLE SET OF BLOOD CULTURES WHEN MULTIPLE SETS ARE DRAWN IS UNCERTAIN. PLEASE NOTIFY THE MICROBIOLOGY DEPARTMENT WITHIN ONE WEEK IF SPECIATION AND SENSITIVITIES ARE REQUIRED. Performed at [facility], [address] [place], [place] [address]    Report Status 02/05/2021 FINAL  Final  Blood Culture ID Panel (Reflexed)     Status: Abnormal   Collection Time: 02/01/21  5:17 PM  Result Value Ref Range Status    Enterococcus faecalis NOT DETECTED NOT DETECTED Final   Enterococcus Faecium NOT DETECTED NOT DETECTED Final   Listeria monocytogenes NOT DETECTED NOT DETECTED Final   Staphylococcus species DETECTED (A) NOT DETECTED Final    Comment: CRITICAL RESULT CALLED TO, READ BACK BY AND VERIFIED WITH: J CARNEY PHARMD 02/02/21 2137 JDW    Staphylococcus aureus (BCID) NOT DETECTED NOT DETECTED Final   Staphylococcus epidermidis DETECTED (A) NOT DETECTED Final    Comment: Methicillin (oxacillin) resistant coagulase negative staphylococcus. Possible blood culture contaminant (unless isolated from more than one blood culture draw or clinical case suggests pathogenicity). No antibiotic treatment is indicated for blood  culture contaminants. CRITICAL RESULT CALLED TO, READ BACK BY AND VERIFIED WITH: J CARNEY PHARMD 02/02/21 2137 JDW    Staphylococcus lugdunensis NOT DETECTED NOT DETECTED Final   Streptococcus species NOT DETECTED NOT DETECTED Final   Streptococcus agalactiae NOT DETECTED NOT DETECTED Final   Streptococcus pneumoniae NOT DETECTED NOT DETECTED Final   Streptococcus pyogenes NOT DETECTED NOT DETECTED Final   A.calcoaceticus-baumannii NOT DETECTED NOT DETECTED Final   Bacteroides fragilis NOT DETECTED NOT DETECTED Final   Enterobacterales NOT DETECTED NOT DETECTED Final   Enterobacter cloacae complex NOT DETECTED NOT DETECTED Final   Escherichia coli NOT DETECTED NOT DETECTED Final   Klebsiella aerogenes NOT DETECTED NOT DETECTED Final   Klebsiella oxytoca NOT DETECTED NOT DETECTED Final   Klebsiella pneumoniae NOT DETECTED NOT DETECTED Final   Proteus species NOT DETECTED NOT DETECTED Final   Salmonella species NOT DETECTED NOT DETECTED Final   Serratia marcescens NOT DETECTED NOT DETECTED Final   Haemophilus influenzae NOT DETECTED NOT DETECTED Final   Neisseria meningitidis NOT DETECTED NOT DETECTED Final   Pseudomonas aeruginosa NOT DETECTED NOT DETECTED Final   Stenotrophomonas  maltophilia NOT DETECTED NOT DETECTED Final   Candida albicans NOT DETECTED NOT DETECTED Final   Candida auris NOT DETECTED NOT DETECTED Final   Candida glabrata NOT DETECTED NOT DETECTED Final   Candida krusei NOT DETECTED NOT DETECTED Final   Candida parapsilosis NOT DETECTED NOT DETECTED Final   Candida tropicalis NOT DETECTED NOT DETECTED Final   Cryptococcus neoformans/gattii NOT DETECTED NOT DETECTED Final   Methicillin resistance mecA/C DETECTED (A) NOT DETECTED Final    Comment: CRITICAL RESULT CALLED TO, READ BACK BY AND VERIFIED WITH: Arnaldo [facility] 02/02/21 2137 JDW Performed at [facility], [address] [place], [place] [address]   MRSA PCR Screening     Status: None   Collection Time: 02/02/21 11:30 PM   Specimen: Nasal Mucosa; Nasopharyngeal  Result Value Ref Range Status   MRSA by PCR NEGATIVE NEGATIVE Final    Comment:        The GeneXpert MRSA Assay (FDA approved for NASAL specimens only), is one component of a comprehensive MRSA colonization surveillance program. It is not intended to diagnose MRSA infection nor to guide or monitor treatment for MRSA infections. Performed at [facility], [address], [place], [place] [address]   Resp Panel by RT-PCR (Flu A&B, Covid) Nasopharyngeal Swab     Status: Abnormal   Collection Time: 02/03/21  9:38 AM   Specimen: Nasopharyngeal Swab; Nasopharyngeal(NP) swabs in vial transport medium  Result Value Ref Range Status   SARS Coronavirus 2 by RT PCR POSITIVE (A) NEGATIVE Final  Comment: RESULT CALLED TO, READ BACK BY AND VERIFIED WITH: A,PENNINGTON RN @1046  02/03/21 EB (NOTE) SARS-CoV-2 target nucleic acids are DETECTED.  The SARS-CoV-2 RNA is generally detectable in upper respiratory specimens during the acute phase of infection. Positive results are indicative of the presence of the identified virus, but do not rule out bacterial infection or co-infection with other pathogens not detected by the  test. Clinical correlation with patient history and other diagnostic information is necessary to determine patient infection status. The expected result is Negative.  Fact Sheet for Patients: BloggerCourse.comhttps://www.fda.gov/media/152166/download  Fact Sheet for Healthcare Providers: SeriousBroker.ithttps://www.fda.gov/media/152162/download  This test is not yet approved or cleared by the [place] FDA and  has been authorized for detection and/or diagnosis of SARS-CoV-2 by FDA under an Emergency Use Authorization (EUA).  This EUA will remain in effect (meaning this test can be  used) for the duration of  the COVID-19 declaration under Section 564(b)(1) of the Act, 21 U.S.C. section 360bbb-3(b)(1), unless the authorization is terminated or revoked sooner.     Influenza A by PCR NEGATIVE NEGATIVE Final   Influenza B by PCR NEGATIVE NEGATIVE Final    Comment: (NOTE) The Xpert Xpress SARS-CoV-2/FLU/RSV plus assay is intended as an aid in the diagnosis of influenza from Nasopharyngeal swab specimens and should not be used as a sole basis for treatment. Nasal washings and aspirates are unacceptable for Xpert Xpress SARS-CoV-2/FLU/RSV testing.  Fact Sheet for Patients: BloggerCourse.comhttps://www.fda.gov/media/152166/download  Fact Sheet for Healthcare Providers: SeriousBroker.ithttps://www.fda.gov/media/152162/download  This test is not yet approved or cleared by the [place] FDA and has been authorized for detection and/or diagnosis of SARS-CoV-2 by FDA under an Emergency Use Authorization (EUA). This EUA will remain in effect (meaning this test can be used) for the duration of the COVID-19 declaration under Section 564(b)(1) of the Act, 21 U.S.C. section 360bbb-3(b)(1), unless the authorization is terminated or revoked.  Performed at [facility], [address] [place], [place] [id number]      Radiology Studies: No results found.  Huey Bienenstockawood Avonna Iribe MD [facility]  Between 7 am - 7 pm I am available,  please contact me via Amion (for emergencies) or Securechat (non urgent messages)  Between 7 pm - 7 am I am not available, please contact night coverage MD/APP via Amion

## 2021-02-10 LAB — GLUCOSE, CAPILLARY
Glucose-Capillary: 106 mg/dL — ABNORMAL HIGH (ref 70–99)
Glucose-Capillary: 110 mg/dL — ABNORMAL HIGH (ref 70–99)
Glucose-Capillary: 110 mg/dL — ABNORMAL HIGH (ref 70–99)
Glucose-Capillary: 125 mg/dL — ABNORMAL HIGH (ref 70–99)
Glucose-Capillary: 128 mg/dL — ABNORMAL HIGH (ref 70–99)
Glucose-Capillary: 151 mg/dL — ABNORMAL HIGH (ref 70–99)
Glucose-Capillary: 173 mg/dL — ABNORMAL HIGH (ref 70–99)

## 2021-02-10 MED ORDER — POTASSIUM CHLORIDE 20 MEQ PO PACK
40.0000 meq | PACK | Freq: Once | ORAL | Status: AC
  Administered 2021-02-10: 40 meq
  Filled 2021-02-10: qty 2

## 2021-02-10 NOTE — Progress Notes (Signed)
   Request for percutaneous gastric tube in IR  Dr Archer Asa has reviewed imaging He dose approve procedure  Covid + Day 7 today of isolation Will wait for day 10 of isolation (5/10)- will see pt 5/11 and plan for tentative  procedure 5/11 or 5/12

## 2021-02-10 NOTE — Progress Notes (Signed)
PROGRESS NOTE  Leonard [name] ZHY:[id number] DOB: 23-Jun-1956 DOA: 01/24/2021 PCP: Pcp, No   LOS: 16 days   Brief Narrative / Interim history:  65 year old male with no significant history came into the hospital with left-sided weakness, left facial droop and dysarthria.  He was found to have an acute right MCA territory infarct status post tPA and bilateral common carotid arteriograms followed by right MCA/ACA/ICA thrombus retrieval with stent angioplasty.  He was admitted in the neuro ICU initially.  Hospitalist service was consulted on 4/29 due to increased work of breathing, he was diagnosed with Covid  Subjective / 24h Interval events:  No significant events overnight as discussed with staff, no urinary retention.  Assessment & Plan:  Acute hypoxic respiratory failure due to Covid 19 pneumonia, possible aspiration pneumonia with sepsis physiology  -patient has been having increased work of breathing for few days per wife but on 4/29 much more so and hospitalist service was consulted.  He was placed on antibiotics due to concern for aspiration given poor mental status. -Chest x-ray with developing multifocal pneumonia.  COVID was sent 4/30 and returned positive -Status post Actemra on 4/30, also placed on Remdesivir along with steroids.  His respiratory status seems to be improving, he is now stable ,  he is on room air this morning -Procalcitonin relatively low, but cannot fully rule out ongoing aspiration.  Completed 5 days of antibiotics with Unasyn, discontinued today. - unfortunately he is unvaccinated -Inflammatory markers improving -Patient has fluctuating mental status due to CVA, repeat  work-up 5/4 and then with no acute findings EKG, repeat  CTA HEAD and  EEG. -Patient will moderate COVID-19 infection, maximum oxygen requirement 6 L, he is currently on room air, will need isolation for total of 10 days as discussed with ID.  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  His MRI on admission showed large acute infarct of the right MCA territory involving the frontal operculum, insula and basal ganglia with hemorrhagic conversion.  A 2D echo showed EF 60-65%, mild LVH.  LDL is 126.  A1c is 5.8.  He is currently on aspirin and Brilinta.  Has a core track for feeding and family agrees to PEG but will need to get over Covid first. -Repeat CT scan stable -PT recommends CIR, will need to be off isolation -He will need PEG tube once more stable, likely will need to transition from Brilinta to heparin GTT around PEG tube insertion time as discussed with neurology.  IR input appreciated, likely will be 5/11 or 5/12.  Cerebral edema  -I have discussed with neurology, patient is so far out of his acute stroke multiple, can keep sodium within current limits currently.  .  Essential hypertension -continue amlodipine, labetalol  Hyperlipidemia -continue statin  Dysphagia -due to stroke, has a core track, continue tube feeds.  Family wants PEG, will hold on requesting PEG given active COVID-19 infection, as well patient remains on Brilinta, will discuss with IR when patient is off COVID isolation, likely 5/11 or 5/12.  Scheduled Meds: . vitamin C  500 mg Per Tube Daily  . aspirin  81 mg Oral Daily   Or  . aspirin  81 mg Per Tube Daily  . atorvastatin  80 mg Per Tube Daily  . chlorhexidine  15 mL Mouth Rinse BID  . Chlorhexidine Gluconate Cloth  6 each Topical Daily  . feeding supplement (PROSource TF)  45 mL Per Tube TID  . free water  300 mL Per Tube Q4H  . heparin injection (subcutaneous)  5,000 Units Subcutaneous Q8H  . mouth rinse  15 mL Mouth Rinse q12n4p  . pantoprazole  sodium  40 mg Per Tube Daily  . predniSONE  50 mg Per Tube Daily  . senna-docusate  1 tablet Per Tube Daily  . ticagrelor  90 mg Oral BID   Or  . ticagrelor  90 mg Per Tube BID  . zinc sulfate  220 mg Oral Daily   Continuous Infusions: . feeding supplement (OSMOLITE 1.5 CAL) 1,000 mL (02/08/21 1034)   PRN Meds:.acetaminophen **OR** acetaminophen (TYLENOL) oral liquid 160 mg/5 mL **OR** acetaminophen, albuterol, labetalol  Diet Orders (From admission, onward)    Start     Ordered   01/25/21 0300  Diet NPO time specified  Diet effective now        01/25/21 0259          DVT prophylaxis: heparin injection 5,000 Units Start: 02/07/21 1400 SCD's Start: 01/25/21 0006, will start on [place] heparin, no contraindication as discussed with neurology Dr. Roda Shutters    Code Status: Full Code  Family Communication: Discussed with wife by phone 5/6  Status is: Inpatient  Remains inpatient appropriate because:Inpatient level of care appropriate due to severity of illness   Dispo: The patient is from: Home              Anticipated d/c is to: CIR              Patient currently is not medically stable to d/c.   Difficult to place patient No   Level of care: Progressive  Consultants:  Neurology   Procedures:  2D echo  Microbiology  1/4 bottles-staph epi blood cultures 4/28 SARS-CoV-2 + on 4/30  Antimicrobials: Cefepime 4/30 >> 5/1 Unasyn 5/1 >> 5/3 Remdesivir 4/30 >>  Objective: Vitals:   02/10/21 0000 02/10/21 0346 02/10/21 0400 02/10/21 0900  BP: 111/64 (!) 155/77 127/83 128/72  Pulse: 73 71 72 80  Resp: 20 20 20 14   Temp: 98.3 F (36.8 C) 97.6 F (36.4 C)  97.7 F (36.5 C)  TempSrc: Oral Axillary  Oral  SpO2: 96% 96% 96% 98%  Weight:  76.2 kg    Height:        Intake/Output Summary (Last 24 hours) at 02/10/2021 1220 Last data filed at 02/10/2021 0800 Gross per 24 hour  Intake 4287 ml  Output 1500 ml  Net 2787 ml   Filed Weights   02/07/21 0336 02/09/21 0357 02/10/21  0346  Weight: 77.7 kg 77.7 kg 76.2 kg    Examination:  Patient is more awake and alert and appropriate today, he follows commands, trying to answer questions, remains with left-sided weakness .Symmetrical Chest wall movement, Good air movement bilaterally, CTAB RRR,No Gallops,Rubs or new Murmurs, No Parasternal Heave +ve B.Sounds, Abd Soft, No tenderness, No rebound - guarding or rigidity. No Cyanosis, Clubbing or edema, No new Rash or bruise     Data Reviewed: I have independently reviewed following labs and imaging studies   CBC: Recent Labs  Lab 02/04/21 0300 02/05/21 0346 02/06/21 0526 02/07/21 0234 02/08/21 0347  WBC 5.4 4.2 4.8 7.4 8.5  NEUTROABS 4.5 3.4 2.3 4.8  5.4  HGB 9.9* 9.9* 9.3* 9.6* 9.6*  HCT 32.3* 31.7* 30.7* 31.3* 29.9*  MCV 96.1 93.8 96.2 94.6 92.9  PLT 423* 413* 392 389 386   Basic Metabolic Panel: Recent Labs  Lab 02/04/21 0300 02/05/21 0346 02/06/21 0526 02/07/21 0234 02/08/21 0347  NA 154* 156* 152* 145 140  K 3.8 4.1 3.6 3.6 3.5  CL 120* 125* 122* 118* 109  CO2 GLUCOSE 159* 169* 160* 156* 122*  BUN 33* 33* 27* 23 21  CREATININE 0.86 0.93 0.87 0.79 0.71  CALCIUM 8.6* 8.5* 8.0* 8.0* 7.9*  MG 2.9* 2.8* 2.7* 2.3 2.3   Liver Function Tests: Recent Labs  Lab 02/04/21 0300 02/05/21 0346 02/06/21 0526 02/07/21 0234 02/08/21 0347  AST 72* 93* 88* 75* 63*  ALT 71* 95* 107* 105* 94*  ALKPHOS 168* 146* 120 111 100  BILITOT 1.4* 1.0 1.0 0.9 1.0  PROT 6.6 6.3* 5.5* 5.5* 5.2*  ALBUMIN 2.5* 2.4* 2.3* 2.5* 2.4*   Coagulation Profile: No results for input(s): INR, PROTIME in the last 168 hours. HbA1C: No results for input(s): HGBA1C in the last 72 hours. CBG: Recent Labs  Lab 02/09/21 1643 02/09/21 2045 02/10/21 0030 02/10/21 0341 02/10/21 0854  GLUCAP 152* 90 128* 125* 110*    Recent Results (from the past 240 hour(s))  Culture, Urine     Status: None   Collection Time: 02/01/21  2:06 PM   Specimen: Urine, Random   Result Value Ref Range Status   Specimen Description URINE, RANDOM  Final   Special Requests NONE  Final   Culture   Final    NO GROWTH Performed at [facility], [address], [place], [place] [address]    Report Status 02/02/2021 FINAL  Final  Culture, blood (routine x 2)     Status: None   Collection Time: 02/01/21  5:17 PM   Specimen: BLOOD RIGHT ARM  Result Value Ref Range Status   Specimen Description BLOOD RIGHT ARM  Final   Special Requests   Final    BOTTLES DRAWN AEROBIC ONLY Blood Culture results Grulke not be optimal due to an inadequate volume of blood received in culture bottles   Culture   Final    NO GROWTH 5 DAYS Performed at [facility], [address] [place], [place] [address]    Report Status 02/06/2021 FINAL  Final  Culture, blood (routine x 2)     Status: Abnormal   Collection Time: 02/01/21  5:17 PM   Specimen: BLOOD LEFT HAND  Result Value Ref Range Status   Specimen Description BLOOD LEFT HAND  Final   Special Requests   Final    BOTTLES DRAWN AEROBIC ONLY Blood Culture results Biddinger not be optimal due to an inadequate volume of blood received in culture bottles   Culture  Setup Time   Final    GRAM POSITIVE COCCI IN CLUSTERS AEROBIC BOTTLE ONLY CRITICAL RESULT CALLED TO, READ BACK BY AND VERIFIED WITH: J CARNEY [facility] 02/02/21 2137 JDW    Culture (A)  Final    STAPHYLOCOCCUS EPIDERMIDIS STAPHYLOCOCCUS CAPITIS THE SIGNIFICANCE OF ISOLATING THIS ORGANISM FROM A SINGLE SET OF BLOOD CULTURES WHEN MULTIPLE SETS ARE DRAWN IS UNCERTAIN. PLEASE NOTIFY THE MICROBIOLOGY DEPARTMENT WITHIN ONE WEEK IF SPECIATION AND SENSITIVITIES ARE REQUIRED. Performed at [facility], [address] [place], [place] [address]    Report Status 02/05/2021 FINAL  Final  Blood Culture ID Panel (Reflexed)  Status: Abnormal   Collection Time: 02/01/21  5:17 PM  Result Value Ref Range Status   Enterococcus faecalis NOT DETECTED NOT DETECTED Final    Enterococcus Faecium NOT DETECTED NOT DETECTED Final   Listeria monocytogenes NOT DETECTED NOT DETECTED Final   Staphylococcus species DETECTED (A) NOT DETECTED Final    Comment: CRITICAL RESULT CALLED TO, READ BACK BY AND VERIFIED WITH: J CARNEY PHARMD 02/02/21 2137 JDW    Staphylococcus aureus (BCID) NOT DETECTED NOT DETECTED Final   Staphylococcus epidermidis DETECTED (A) NOT DETECTED Final    Comment: Methicillin (oxacillin) resistant coagulase negative staphylococcus. Possible blood culture contaminant (unless isolated from more than one blood culture draw or clinical case suggests pathogenicity). No antibiotic treatment is indicated for blood  culture contaminants. CRITICAL RESULT CALLED TO, READ BACK BY AND VERIFIED WITH: J CARNEY PHARMD 02/02/21 2137 JDW    Staphylococcus lugdunensis NOT DETECTED NOT DETECTED Final   Streptococcus species NOT DETECTED NOT DETECTED Final   Streptococcus agalactiae NOT DETECTED NOT DETECTED Final   Streptococcus pneumoniae NOT DETECTED NOT DETECTED Final   Streptococcus pyogenes NOT DETECTED NOT DETECTED Final   A.calcoaceticus-baumannii NOT DETECTED NOT DETECTED Final   Bacteroides fragilis NOT DETECTED NOT DETECTED Final   Enterobacterales NOT DETECTED NOT DETECTED Final   Enterobacter cloacae complex NOT DETECTED NOT DETECTED Final   Escherichia coli NOT DETECTED NOT DETECTED Final   Klebsiella aerogenes NOT DETECTED NOT DETECTED Final   Klebsiella oxytoca NOT DETECTED NOT DETECTED Final   Klebsiella pneumoniae NOT DETECTED NOT DETECTED Final   Proteus species NOT DETECTED NOT DETECTED Final   Salmonella species NOT DETECTED NOT DETECTED Final   Serratia marcescens NOT DETECTED NOT DETECTED Final   Haemophilus influenzae NOT DETECTED NOT DETECTED Final   Neisseria meningitidis NOT DETECTED NOT DETECTED Final   Pseudomonas aeruginosa NOT DETECTED NOT DETECTED Final   Stenotrophomonas maltophilia NOT DETECTED NOT DETECTED Final   Candida  albicans NOT DETECTED NOT DETECTED Final   Candida auris NOT DETECTED NOT DETECTED Final   Candida glabrata NOT DETECTED NOT DETECTED Final   Candida krusei NOT DETECTED NOT DETECTED Final   Candida parapsilosis NOT DETECTED NOT DETECTED Final   Candida tropicalis NOT DETECTED NOT DETECTED Final   Cryptococcus neoformans/gattii NOT DETECTED NOT DETECTED Final   Methicillin resistance mecA/C DETECTED (A) NOT DETECTED Final    Comment: CRITICAL RESULT CALLED TO, READ BACK BY AND VERIFIED WITH: Arnaldo [facility] 02/02/21 2137 JDW Performed at [facility], [address] [place], [place] [address]   MRSA PCR Screening     Status: None   Collection Time: 02/02/21 11:30 PM   Specimen: Nasal Mucosa; Nasopharyngeal  Result Value Ref Range Status   MRSA by PCR NEGATIVE NEGATIVE Final    Comment:        The GeneXpert MRSA Assay (FDA approved for NASAL specimens only), is one component of a comprehensive MRSA colonization surveillance program. It is not intended to diagnose MRSA infection nor to guide or monitor treatment for MRSA infections. Performed at [facility], [address], [place], [place] [address]   Resp Panel by RT-PCR (Flu A&B, Covid) Nasopharyngeal Swab     Status: Abnormal   Collection Time: 02/03/21  9:38 AM   Specimen: Nasopharyngeal Swab; Nasopharyngeal(NP) swabs in vial transport medium  Result Value Ref Range Status   SARS Coronavirus 2 by RT PCR POSITIVE (A) NEGATIVE Final    Comment: RESULT CALLED TO, READ BACK BY AND VERIFIED WITH:  A,PENNINGTON RN @1046  02/03/21 EB (NOTE) SARS-CoV-2 target nucleic acids are DETECTED.  The SARS-CoV-2 RNA is generally detectable in upper respiratory specimens during the acute phase of infection. Positive results are indicative of the presence of the identified virus, but do not rule out bacterial infection or co-infection with other pathogens not detected by the test. Clinical correlation with patient history  and other diagnostic information is necessary to determine patient infection status. The expected result is Negative.  Fact Sheet for Patients: 02/05/21  Fact Sheet for Healthcare Providers: BloggerCourse.com  This test is not yet approved or cleared by the [employer] FDA and  has been authorized for detection and/or diagnosis of SARS-CoV-2 by FDA under an Emergency Use Authorization (EUA).  This EUA will remain in effect (meaning this test can be  used) for the duration of  the COVID-19 declaration under Section 564(b)(1) of the Act, 21 U.S.C. section 360bbb-3(b)(1), unless the authorization is terminated or revoked sooner.     Influenza A by PCR NEGATIVE NEGATIVE Final   Influenza B by PCR NEGATIVE NEGATIVE Final    Comment: (NOTE) The Xpert Xpress SARS-CoV-2/FLU/RSV plus assay is intended as an aid in the diagnosis of influenza from Nasopharyngeal swab specimens and should not be used as a sole basis for treatment. Nasal washings and aspirates are unacceptable for Xpert Xpress SARS-CoV-2/FLU/RSV testing.  Fact Sheet for Patients: [place]  Fact Sheet for Healthcare Providers: BloggerCourse.com  This test is not yet approved or cleared by the [employer]ious[employer] FDA and has been authorized for detection and/or diagnosis of SARS-CoV-2 by FDA under an Emergency Use Authorization (EUA). This EUA will remain in effect (meaning this test can be used) for the duration of the COVID-19 declaration under Section 564(b)(1) of the Act, 21 U.S.C. section 360bbb-3(b)(1), unless the authorization is terminated or revoked.  Performed at [facility], [address] [place], [place]      Radiology Studies: CT ABDOMEN WO CONTRAST  Result Date: 02/10/2021 CLINICAL DATA:  Assess anatomy for possible gastrostomy tube placement. History of cerebrovascular  accident. EXAM: CT ABDOMEN WITHOUT CONTRAST TECHNIQUE: Multidetector CT imaging of the abdomen was performed following the standard protocol without IV contrast. COMPARISON:  None. FINDINGS: Lower chest: Nonspecific patchy ground-glass attenuation airspace opacities in the visualized lower lungs bilaterally. Visualized cardiac structures are normal in size. No pericardial effusion. Unremarkable distal thoracic esophagus conveying a feeding tube. Hepatobiliary: No focal liver abnormality is seen. No gallstones, gallbladder wall thickening, or biliary dilatation. Pancreas: Unremarkable. No pancreatic ductal dilatation or surrounding inflammatory changes. Spleen: Normal in size without focal abnormality. Adrenals/Urinary Tract: Adrenal glands are unremarkable. Kidneys are normal, without renal calculi, focal lesion, or hydronephrosis. Stomach/Bowel: Normal gastric anatomy. Transpyloric feeding tube tip is present in the proximal duodenum. No significant colonic interposition anterior to the stomach. Vascular/Lymphatic: Mild atherosclerotic calcification within the abdominal aorta. No suspicious lymphadenopathy. Other: No abdominal wall hernia or abnormality. Musculoskeletal: No acute fracture or aggressive appearing lytic or blastic osseous lesion. IMPRESSION: Anatomy is suitable for percutaneous gastrostomy tube placement. Electronically Signed   By: 04/12/2021 M.D.   On: 02/10/2021 08:56    Mead Slane MD [facility]  Between 7 am - 7 pm I am available, please contact me via Amion (for emergencies) or Securechat (non urgent messages)  Between 7 pm - 7 am I am not available, please contact night coverage MD/APP via Amion

## 2021-02-11 LAB — GLUCOSE, CAPILLARY
Glucose-Capillary: 141 mg/dL — ABNORMAL HIGH (ref 70–99)
Glucose-Capillary: 129 mg/dL — ABNORMAL HIGH (ref 70–99)
Glucose-Capillary: 131 mg/dL — ABNORMAL HIGH (ref 70–99)
Glucose-Capillary: 166 mg/dL — ABNORMAL HIGH (ref 70–99)
Glucose-Capillary: 128 mg/dL — ABNORMAL HIGH (ref 70–99)
Glucose-Capillary: 141 mg/dL — ABNORMAL HIGH (ref 70–99)

## 2021-02-11 LAB — BASIC METABOLIC PANEL
Anion gap: 7 (ref 5–15)
BUN: 17 mg/dL (ref 8–23)
CO2: 25 mmol/L (ref 22–32)
Calcium: 8.3 mg/dL — ABNORMAL LOW (ref 8.9–10.3)
Chloride: 105 mmol/L (ref 98–111)
Creatinine, Ser: 0.69 mg/dL (ref 0.61–1.24)
GFR, Estimated: >60 mL/min (ref >60)
Glucose, Bld: 128 mg/dL — ABNORMAL HIGH (ref 70–99)
Potassium: 3.7 mmol/L (ref 3.5–5.1)
Sodium: 137 mmol/L (ref 135–145)

## 2021-02-11 NOTE — Progress Notes (Signed)
PROGRESS NOTE  Leonard [name] BMW:[id number] DOB: August 19, 1956 DOA: 01/24/2021 PCP: Pcp, No   LOS: 17 days   Brief Narrative / Interim history:  65 year old male with no significant history came into the hospital with left-sided weakness, left facial droop and dysarthria.  He was found to have an acute right MCA territory infarct status post tPA and bilateral common carotid arteriograms followed by right MCA/ACA/ICA thrombus retrieval with stent angioplasty.  He was admitted in the neuro ICU initially.  Hospitalist service was consulted on 4/29 due to increased work of breathing, he was diagnosed with Covid  Subjective / 24h Interval events:  No events overnight, no retention  Assessment & Plan:  Acute hypoxic respiratory failure due to Covid 19 pneumonia, possible aspiration pneumonia with sepsis physiology  -patient has been having increased work of breathing for few days per wife but on 4/29 much more so and hospitalist service was consulted.  He was placed on antibiotics due to concern for aspiration given poor mental status. -Chest x-ray with developing multifocal pneumonia.  COVID was sent 4/30 and returned positive -Status post Actemra on 4/30, also placed on Remdesivir along with steroids.  His respiratory status seems to be improving, he is now stable ,  he is on room air this morning -Procalcitonin relatively low, but cannot fully rule out ongoing aspiration.  Completed 5 days of antibiotics with Unasyn, discontinued today. - unfortunately he is unvaccinated -Inflammatory markers improving -Patient has fluctuating mental status due to CVA, repeat  work-up 5/4 and then with no acute findings EKG, repeat  CTA HEAD and  EEG. -Patient will moderate COVID-19 infection, as his Maximus requirement was up to 6 L, this has improved rapidly with treatment, he is on room air for last few days, he will need total 10 days of isolation from diagnosis day as I have discussed with ID.  -He can come  off isolation 5/11  COVID-19 Labs  No results for input(s): DDIMER, FERRITIN, LDH, CRP in the last 72 hours.  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  His MRI on admission showed large acute infarct of the right MCA territory involving the frontal operculum, insula and basal ganglia with hemorrhagic conversion.  A 2D echo showed EF 60-65%, mild LVH.  LDL is 126.  A1c is 5.8.  He is currently on aspirin and Brilinta.  Has a core track for feeding and family agrees to PEG but will need to get over Covid first. -Repeat CT scan stable -PT recommends CIR, will need to be off isolation  Cerebral edema  -I have discussed with neurology, patient is so far out of his acute stroke multiple, can keep sodium within current limits currently.  .  Essential hypertension -continue amlodipine, labetalol  Hyperlipidemia -continue statin  Dysphagia -due to stroke, has a core track, continue tube feeds.  Family wants PEG. -IR consulted regarding PEG tube, likely to be done 5/11 or 5/12 once he is off COVID isolation. -I have discussed with IR Dr. Archer Asa, patient patient on Brilinta for carotid stent placement, high risk of thrombosis if it stopped, risk of thrombosis outweighed risks of bleed from procedures, so we will need to continue.  Scheduled Meds: . vitamin C  500 mg Per Tube Daily  . aspirin  81 mg Oral Daily  Or  . aspirin  81 mg Per Tube Daily  . atorvastatin  80 mg Per Tube Daily  . chlorhexidine  15 mL Mouth Rinse BID  . Chlorhexidine Gluconate Cloth  6 each Topical Daily  . feeding supplement (PROSource TF)  45 mL Per Tube TID  . free water  300 mL Per Tube Q4H  . heparin injection (subcutaneous)  5,000 Units Subcutaneous Q8H  . mouth rinse  15 mL Mouth Rinse  [id number]  . pantoprazole sodium  40 mg Per Tube Daily  . predniSONE  50 mg Per Tube Daily  . senna-docusate  1 tablet Per Tube Daily  . ticagrelor  90 mg Oral BID   Or  . ticagrelor  90 mg Per Tube BID  . zinc sulfate  220 mg Oral Daily   Continuous Infusions: . feeding supplement (OSMOLITE 1.5 CAL) 1,000 mL (02/10/21 2345)   PRN Meds:.acetaminophen **OR** acetaminophen (TYLENOL) oral liquid 160 mg/5 mL **OR** acetaminophen, albuterol, labetalol  Diet Orders (From admission, onward)    Start     Ordered   01/25/21 0300  Diet NPO time specified  Diet effective now        01/25/21 0259          DVT prophylaxis: heparin injection 5,000 Units Start: 02/07/21 1400 SCD's Start: 01/25/21 0006, will start on [place] heparin, no contraindication as discussed with neurology Dr. Roda Shutters    Code Status: Full Code  Family Communication: Discussed with wife by phone 5/6  Status is: Inpatient  Remains inpatient appropriate because:Inpatient level of care appropriate due to severity of illness   Dispo: The patient is from: Home              Anticipated d/c is to: CIR              Patient currently is not medically stable to d/c.   Difficult to place patient No   Level of care: Progressive  Consultants:  Neurology   Procedures:  2D echo  Microbiology  1/4 bottles-staph epi blood cultures 4/28 SARS-CoV-2 + on 4/30  Antimicrobials: Cefepime 4/30 >> 5/1 Unasyn 5/1 >> 5/3 Remdesivir 4/30 >>  Objective: Vitals:   02/10/21 1935 02/10/21 2330 02/11/21 0328 02/11/21 0827  BP: (!) 144/84 (!) 141/73 133/88 (!) 147/81  Pulse: 87 69 83 73  Resp: 20 19 19 18   Temp: 98 F (36.7 C) 98 F (36.7 C) 97.9 F (36.6 C) 98.1 F (36.7 C)  TempSrc: Oral Oral Oral Oral  SpO2: 98% 96% 97% 98%  Weight:   76.9 kg   Height:        Intake/Output Summary (Last 24 hours) at 02/11/2021 1353 Last data filed at 02/11/2021 0403 Gross per 24 hour  Intake --  Output 2800 ml  Net -2800 ml   Filed Weights    02/09/21 0357 02/10/21 0346 02/11/21 0328  Weight: 77.7 kg 76.2 kg 76.9 kg    Examination:  Patient awake, alert, more appropriate today, following commands, and try to mumble appropriate answers to questions, he remains with significant left-sided weakness Symmetrical Chest wall movement, Good air movement bilaterally, CTAB RRR,No Gallops,Rubs or new Murmurs, No Parasternal Heave +ve B.Sounds, Abd Soft, No tenderness, No rebound - guarding or rigidity. No Cyanosis, Clubbing or edema, No new Rash or bruise     Data Reviewed: I have independently reviewed following labs and imaging studies   CBC: Recent Labs  Lab 02/05/21 0346 02/06/21 0526 02/07/21 0234 02/08/21 0347  WBC 4.2 4.8  7.4 8.5  NEUTROABS 3.4 2.3 4.8 5.4  HGB 9.9* 9.3* 9.6* 9.6*  HCT 31.7* 30.7* 31.3* 29.9*  MCV 93.8 96.2 94.6 92.9  PLT 413* 392 389 386   Basic Metabolic Panel: Recent Labs  Lab 02/05/21 0346 02/06/21 0526 02/07/21 0234 02/08/21 0347 02/11/21 0239  NA 156* 152* 145 140 137  K 4.1 3.6 3.6 3.5 3.7  CL 125* 122* 118* 109 105  CO2 22 24 24 24 25   GLUCOSE 169* 160* 156* 122* 128*  BUN 33* 27* 23 21 17   CREATININE 0.93 0.87 0.79 0.71 0.69  CALCIUM 8.5* 8.0* 8.0* 7.9* 8.3*  MG 2.8* 2.7* 2.3 2.3  --    Liver Function Tests: Recent Labs  Lab 02/05/21 0346 02/06/21 0526 02/07/21 0234 02/08/21 0347  AST 93* 88* 75* 63*  ALT 95* 107* 105* 94*  ALKPHOS 146* 120 111 100  BILITOT 1.0 1.0 0.9 1.0  PROT 6.3* 5.5* 5.5* 5.2*  ALBUMIN 2.4* 2.3* 2.5* 2.4*   Coagulation Profile: No results for input(s): INR, PROTIME in the last 168 hours. HbA1C: No results for input(s): HGBA1C in the last 72 hours. CBG: Recent Labs  Lab 02/10/21 1932 02/10/21 2323 02/11/21 0326 02/11/21 0824 02/11/21 1206  GLUCAP 110* 106* 141* 129* 131*    Recent Results (from the past 240 hour(s))  Culture, Urine     Status: None   Collection Time: 02/01/21  2:06 PM   Specimen: Urine, Random  Result Value Ref  Range Status   Specimen Description URINE, RANDOM  Final   Special Requests NONE  Final   Culture   Final    NO GROWTH Performed at [facility], [address], [place], [address] [place]    Report Status 02/02/2021 FINAL  Final  Culture, blood (routine x 2)     Status: None   Collection Time: 02/01/21  5:17 PM   Specimen: BLOOD RIGHT ARM  Result Value Ref Range Status   Specimen Description BLOOD RIGHT ARM  Final   Special Requests   Final    BOTTLES DRAWN AEROBIC ONLY Blood Culture results [place] not be optimal due to an inadequate volume of blood received in culture bottles   Culture   Final    NO GROWTH 5 DAYS Performed at [facility], [address], [place], [address] [place]    Report Status 02/06/2021 FINAL  Final  Culture, blood (routine x 2)     Status: Abnormal   Collection Time: 02/01/21  5:17 PM   Specimen: BLOOD LEFT HAND  Result Value Ref Range Status   Specimen Description BLOOD LEFT HAND  Final   Special Requests   Final    BOTTLES DRAWN AEROBIC ONLY Blood Culture results [place] not be optimal due to an inadequate volume of blood received in culture bottles   Culture  Setup Time   Final    GRAM POSITIVE COCCI IN CLUSTERS AEROBIC BOTTLE ONLY CRITICAL RESULT CALLED TO, READ BACK BY AND VERIFIED WITH: J CARNEY [facility] 02/02/21 2137 JDW    Culture (A)  Final    STAPHYLOCOCCUS EPIDERMIDIS STAPHYLOCOCCUS CAPITIS THE SIGNIFICANCE OF ISOLATING THIS ORGANISM FROM A SINGLE SET OF BLOOD CULTURES WHEN MULTIPLE SETS ARE DRAWN IS UNCERTAIN. PLEASE NOTIFY THE MICROBIOLOGY DEPARTMENT WITHIN ONE WEEK IF SPECIATION AND SENSITIVITIES ARE REQUIRED. Performed at [facility], [address] [place], [address] [place]    Report Status 02/05/2021 FINAL  Final  Blood Culture ID Panel (Reflexed)  Status: Abnormal   Collection Time: 02/01/21  5:17 PM  Result Value Ref Range Status   Enterococcus faecalis NOT DETECTED NOT DETECTED Final   Enterococcus Faecium NOT  DETECTED NOT DETECTED Final   Listeria monocytogenes NOT DETECTED NOT DETECTED Final   Staphylococcus species DETECTED (A) NOT DETECTED Final    Comment: CRITICAL RESULT CALLED TO, READ BACK BY AND VERIFIED WITH: J CARNEY PHARMD 02/02/21 2137 JDW    Staphylococcus aureus (BCID) NOT DETECTED NOT DETECTED Final   Staphylococcus epidermidis DETECTED (A) NOT DETECTED Final    Comment: Methicillin (oxacillin) resistant coagulase negative staphylococcus. Possible blood culture contaminant (unless isolated from more than one blood culture draw or clinical case suggests pathogenicity). No antibiotic treatment is indicated for blood  culture contaminants. CRITICAL RESULT CALLED TO, READ BACK BY AND VERIFIED WITH: J CARNEY PHARMD 02/02/21 2137 JDW    Staphylococcus lugdunensis NOT DETECTED NOT DETECTED Final   Streptococcus species NOT DETECTED NOT DETECTED Final   Streptococcus agalactiae NOT DETECTED NOT DETECTED Final   Streptococcus pneumoniae NOT DETECTED NOT DETECTED Final   Streptococcus pyogenes NOT DETECTED NOT DETECTED Final   A.calcoaceticus-baumannii NOT DETECTED NOT DETECTED Final   Bacteroides fragilis NOT DETECTED NOT DETECTED Final   Enterobacterales NOT DETECTED NOT DETECTED Final   Enterobacter cloacae complex NOT DETECTED NOT DETECTED Final   Escherichia coli NOT DETECTED NOT DETECTED Final   Klebsiella aerogenes NOT DETECTED NOT DETECTED Final   Klebsiella oxytoca NOT DETECTED NOT DETECTED Final   Klebsiella pneumoniae NOT DETECTED NOT DETECTED Final   Proteus species NOT DETECTED NOT DETECTED Final   Salmonella species NOT DETECTED NOT DETECTED Final   Serratia marcescens NOT DETECTED NOT DETECTED Final   Haemophilus influenzae NOT DETECTED NOT DETECTED Final   Neisseria meningitidis NOT DETECTED NOT DETECTED Final   Pseudomonas aeruginosa NOT DETECTED NOT DETECTED Final   Stenotrophomonas maltophilia NOT DETECTED NOT DETECTED Final   Candida albicans NOT DETECTED NOT  DETECTED Final   Candida auris NOT DETECTED NOT DETECTED Final   Candida glabrata NOT DETECTED NOT DETECTED Final   Candida krusei NOT DETECTED NOT DETECTED Final   Candida parapsilosis NOT DETECTED NOT DETECTED Final   Candida tropicalis NOT DETECTED NOT DETECTED Final   Cryptococcus neoformans/gattii NOT DETECTED NOT DETECTED Final   Methicillin resistance mecA/C DETECTED (A) NOT DETECTED Final    Comment: CRITICAL RESULT CALLED TO, READ BACK BY AND VERIFIED WITH: Arnaldo NatalJ CARNEY [facility]HARMD 02/02/21 2137 JDW Performed at [facility], [address] [place], [place] [address]   MRSA PCR Screening     Status: None   Collection Time: 02/02/21 11:30 PM   Specimen: Nasal Mucosa; Nasopharyngeal  Result Value Ref Range Status   MRSA by PCR NEGATIVE NEGATIVE Final    Comment:        The GeneXpert MRSA Assay (FDA approved for NASAL specimens only), is one component of a comprehensive MRSA colonization surveillance program. It is not intended to diagnose MRSA infection nor to guide or monitor treatment for MRSA infections. Performed at [facility], [address] [place], [place] [address]   Resp Panel by RT-PCR (Flu A&B, Covid) Nasopharyngeal Swab     Status: Abnormal   Collection Time: 02/03/21  9:38 AM   Specimen: Nasopharyngeal Swab; Nasopharyngeal(NP) swabs in vial transport medium  Result Value Ref Range Status   SARS Coronavirus 2 by RT PCR POSITIVE (A) NEGATIVE Final    Comment: RESULT CALLED TO, READ BACK BY AND VERIFIED WITH:  A,PENNINGTON RN  02/03/21 EB (NOTE) SARS-CoV-2 target nucleic acids are DETECTED.  The SARS-CoV-2 RNA is generally detectable in upper respiratory specimens during the acute phase of infection. Positive results are indicative of the presence of the identified virus, but do not rule out bacterial infection or co-infection with other pathogens not detected by the test. Clinical correlation with patient history and other diagnostic information  is necessary to determine patient infection status. The expected result is Negative.  Fact Sheet for Patients: BloggerCourse.com  Fact Sheet for Healthcare Providers: SeriousBroker.it  This test is not yet approved or cleared by the [place] FDA and  has been authorized for detection and/or diagnosis of SARS-CoV-2 by FDA under an Emergency Use Authorization (EUA).  This EUA will remain in effect (meaning this test can be  used) for the duration of  the COVID-19 declaration under Section 564(b)(1) of the Act, 21 U.S.C. section 360bbb-3(b)(1), unless the authorization is terminated or revoked sooner.     Influenza A by PCR NEGATIVE NEGATIVE Final   Influenza B by PCR NEGATIVE NEGATIVE Final    Comment: (NOTE) The Xpert Xpress SARS-CoV-2/FLU/RSV plus assay is intended as an aid in the diagnosis of influenza from Nasopharyngeal swab specimens and should not be used as a sole basis for treatment. Nasal washings and aspirates are unacceptable for Xpert Xpress SARS-CoV-2/FLU/RSV testing.  Fact Sheet for Patients: BloggerCourse.com  Fact Sheet for Healthcare Providers: SeriousBroker.it  This test is not yet approved or cleared by the [place] FDA and has been authorized for detection and/or diagnosis of SARS-CoV-2 by FDA under an Emergency Use Authorization (EUA). This EUA will remain in effect (meaning this test can be used) for the duration of the COVID-19 declaration under Section 564(b)(1) of the Act, 21 U.S.C. section 360bbb-3(b)(1), unless the authorization is terminated or revoked.  Performed at [facility], [address] [place], [place] [address]      Radiology Studies: No results found.  Huey Bienenstock MD [facility]  Between 7 am - 7 pm I am available, please contact me via Amion (for emergencies) or Securechat (non urgent  messages)  Between 7 pm - 7 am I am not available, please contact night coverage MD/APP via Amion

## 2021-02-12 LAB — BASIC METABOLIC PANEL
Anion gap: 7 (ref 5–15)
BUN: 18 mg/dL (ref 8–23)
CO2: 23 mmol/L (ref 22–32)
Calcium: 8.4 mg/dL — ABNORMAL LOW (ref 8.9–10.3)
Chloride: 106 mmol/L (ref 98–111)
Creatinine, Ser: 0.73 mg/dL (ref 0.61–1.24)
GFR, Estimated: >60 mL/min (ref >60)
Glucose, Bld: 155 mg/dL — ABNORMAL HIGH (ref 70–99)
Potassium: 4 mmol/L (ref 3.5–5.1)
Sodium: 136 mmol/L (ref 135–145)

## 2021-02-12 LAB — GLUCOSE, CAPILLARY
Glucose-Capillary: 135 mg/dL — ABNORMAL HIGH (ref 70–99)
Glucose-Capillary: 129 mg/dL — ABNORMAL HIGH (ref 70–99)
Glucose-Capillary: 96 mg/dL (ref 70–99)
Glucose-Capillary: 102 mg/dL — ABNORMAL HIGH (ref 70–99)
Glucose-Capillary: 122 mg/dL — ABNORMAL HIGH (ref 70–99)

## 2021-02-12 MED ORDER — DOXAZOSIN MESYLATE 2 MG PO TABS
2.0000 mg | ORAL_TABLET | Freq: Every day | ORAL | Status: DC
  Administered 2021-02-12 – 2021-02-13 (×2): 2 mg
  Filled 2021-02-12 (×3): qty 1

## 2021-02-12 MED ORDER — TAMSULOSIN HCL 0.4 MG PO CAPS: 0.4000 mg | ORAL_CAPSULE | Freq: Every day | ORAL | Status: DC

## 2021-02-12 NOTE — Progress Notes (Signed)
Had 1000 out in primofit but his bladder scan showed 455 ml in his bladder and he said he could not void so I paged MD and got an order for an I&O cath.  As soon as I explained what was going to be done and showed him the cath, he said "No thanks, I can pee" and he voided 480 ml.  Did not need to be cathed after that.

## 2021-02-12 NOTE — Progress Notes (Signed)
Inpatient Rehab Admissions Coordinator:  Pt remains on airborne precautions for COVID.  Patients are eligible to be considered for admit to CIR when cleared from airborne precautions by acute MD or Infectious disease.  Otherwise, they will need to be >20 days from their positive test with recovery/improvement in symptoms (5/21) or 2 negative tests.  Will continue to follow from a distance.   Wolfgang Phoenix, MS, CCC-SLP Admissions Coordinator [phone]

## 2021-02-12 NOTE — Progress Notes (Signed)
Physical Therapy Treatment Patient Details Name: Leonard [name] Canning MRN: [id number] DOB: 06-22-1956 Today's Date: 02/12/2021    History of Present Illness 65 y.o. male who presented 4/20 with acute onset L hemiplegia, L facial droop, dysarthria, and L hemineglect. tPA was administered. 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 S/p bil common carotid arteriograms and revascularization of occluded R MCA and R ACA prox and R ICA terminus. ETT 4/20-4/21. COVID + 4/30. No significant PMH.    PT Comments    Today's skilled session continued to address mobility progression. VSS on RA this session. Limited to EOB/pivot steps for safety due to lack of +2 assist this session. The pt is making progress and should benefit from CIR stay prior to discharge home to maximize functional mobility.   Follow Up Recommendations  CIR;Supervision/Assistance - 24 hour     Equipment Recommendations  3in1 (PT);Wheelchair (measurements PT);Wheelchair cushion (measurements PT);Other (comment)    Recommendations for Other Services Rehab consult     Precautions / Restrictions Precautions Precautions: Fall Precaution Comments: L neglect Restrictions Weight Bearing Restrictions: No    Mobility  Bed Mobility Overal bed mobility: Needs Assistance Bed Mobility: Supine to Sit     Supine to sit: Mod assist;HOB elevated     General bed mobility comments: with HOB 30 degrees and rail used, cues needed on sequencing with most assist needed for trunk elevation into sitting at edge of bed.    Transfers Overall transfer level: Needs assistance Equipment used: 1 person hand held assist Transfers: Sit to/from Stand Sit to Stand: Mod assist Stand pivot transfers: Mod assist       General transfer  comment: mod assist of 1 person to stand from bed at lowest height, cues on hand placement in weight shifting. With initial standing at EOB pt with left knee buckling, Mod assist working on posture with left knee blocked until pt able to demo improved stance stability. with mod/max assist pt was able to take 4-5 steps from bed to chair with assist for balance, cues on posture and light blocking of left knee for safety. mod assist for controlled descent to sit into chair. Pt then able to self scoot reciprocally back into the chair.   Modified Rankin (Stroke Patients Only) Modified Rankin (Stroke Patients Only) Pre-Morbid Rankin Score: No symptoms Modified Rankin: Moderately severe disability     Balance Overall balance assessment: Needs assistance Sitting-balance support: Feet supported;No upper extremity supported Sitting balance-Leahy Scale: Fair Sitting balance - Comments: sitting EOB with close supervision- pt needed max assist to don new gown, able to raise arms into sleeves only to assist. does occasionally have posterior lean at EOB, able to correct with cues. Postural control: Posterior lean Standing balance support: No upper extremity supported;Single extremity supported;During functional activity Standing balance-Leahy Scale: Poor Standing balance comment: mod/max assist of 1 person for static balance, then for step to chair             Cognition Arousal/Alertness: Awake/alert Behavior During Therapy: Flat affect Overall Cognitive Status: Impaired/Different from baseline Area of Impairment: Attention;Memory;Following commands;Safety/judgement;Awareness;Problem solving                 Orientation Level: Disoriented to;Time;Situation Current Attention Level: Focused Memory: Decreased recall of precautions;Decreased short-term memory Following Commands: Follows one step commands consistently;Follows one  step commands with increased time Safety/Judgement: Decreased  awareness of safety;Decreased awareness of deficits Awareness: Intellectual Problem Solving: Slow processing;Decreased initiation;Difficulty sequencing;Requires verbal cues;Requires tactile cues General Comments: pt requires max mulitmodal cueing at times to follow simple commands, poor attention, safety and awareness to deficits. impulsive at EOB today, cues for safety needed.       Pertinent Vitals/Pain Pain Assessment: No/denies pain Pain Score: 0-No pain     PT Goals (current goals can now be found in the care plan section) Acute Rehab PT Goals Patient Stated Goal: to get better/ get out of here PT Goal Formulation: With patient/family Time For Goal Achievement: 02/23/21 Potential to Achieve Goals: Fair Progress towards PT goals: Progressing toward goals    Frequency    Min 4X/week      PT Plan Current plan remains appropriate    AM-PAC PT "6 Clicks" Mobility   Outcome Measure  Help needed turning from your back to your side while in a flat bed without using bedrails?: Total Help needed moving from lying on your back to sitting on the side of a flat bed without using bedrails?: Total Help needed moving to and from a bed to a chair (including a wheelchair)?: A Lot Help needed standing up from a chair using your arms (e.g., wheelchair or bedside chair)?: A Lot Help needed to walk in hospital room?: Total Help needed climbing 3-5 steps with a railing? : Total 6 Click Score: 8    End of Session Equipment Utilized During Treatment: Gait belt Activity Tolerance: Patient tolerated treatment well Patient left: in chair;with call bell/phone within reach;with chair alarm set Nurse Communication: Mobility status PT Visit Diagnosis: Unsteadiness on feet (R26.81);Other abnormalities of gait and mobility (R26.89);Muscle weakness (generalized) (M62.81);Difficulty in walking, not elsewhere classified (R26.2);Other symptoms and signs involving the nervous system (R29.898);Hemiplegia  and hemiparesis Hemiplegia - Right/Left: Left Hemiplegia - caused by: Cerebral infarction     Time: 2595-6387 PT Time Calculation (min) (ACUTE ONLY): 28 min  Charges:  $Therapeutic Activity: 8-22 mins $Neuromuscular Re-education: 8-22 mins           Sallyanne Kuster, PTA, [facility] Acute Rehab Services Office- [phone] 02/12/21, 1:56 PM  Sallyanne Kuster 02/12/2021, 1:55 PM

## 2021-02-12 NOTE — Progress Notes (Signed)
PROGRESS NOTE  Chukwudi Ewen Severns RXV:[id number] DOB: November 22, 1955 DOA: 01/24/2021 PCP: Pcp, No   LOS: 18 days   Brief Narrative / Interim history:  65 year old male with no significant history came into the hospital with left-sided weakness, left facial droop and dysarthria.  He was found to have an acute right MCA territory infarct status post tPA and bilateral common carotid arteriograms followed by right MCA/ACA/ICA thrombus retrieval with stent angioplasty.  He was admitted in the neuro ICU initially.  Hospitalist service was consulted on 4/29 due to increased work of breathing, he was diagnosed with Covid  Subjective / 24h Interval events:  At 1 point patient had 450 cc in bladder scan, but he was able to eventually urinate himself without in and out.  Assessment & Plan:  Acute hypoxic respiratory failure due to Covid 19 pneumonia, possible aspiration pneumonia with sepsis physiology  -patient has been having increased work of breathing for few days per wife but on 4/29 much more so and hospitalist service was consulted.  He was placed on antibiotics due to concern for aspiration given poor mental status. -Chest x-ray with developing multifocal pneumonia.  COVID was sent 4/30 and returned positive -Status post Actemra on 4/30, also placed on Remdesivir along with steroids.  Bilateral status has improved, back to baseline, he is on room air.   -Procalcitonin relatively low, but cannot fully rule out ongoing aspiration.  Completed 5 days of antibiotics with Unasyn, discontinued today. - unfortunately he is unvaccinated -Patient has fluctuating mental status due to CVA, repeat  work-up 5/4 and then with no acute findings EKG, repeat  CTA HEAD and  EEG. -Patient will moderate COVID-19 infection, as his oxygen requirement was up to 6 L, this has improved rapidly with treatment, he is on room air for last few days, he will need total 10 days of isolation from diagnosis day as I have discussed with  ID.  -He can come off isolation 5/11  COVID-19 Labs  No results for input(s): DDIMER, FERRITIN, LDH, CRP in the last 72 hours.  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  His MRI on admission showed large acute infarct of the right MCA territory involving the frontal operculum, insula and basal ganglia with hemorrhagic conversion.  A 2D echo showed EF 60-65%, mild LVH.  LDL is 126.  A1c is 5.8.  He is currently on aspirin and Brilinta.  Has a core track for feeding and family agrees to PEG but will need to get over Covid first. -Repeat CT scan stable -PT recommends CIR, will need to be off isolation  Cerebral edema  -I have discussed with neurology, patient is so far out of his acute stroke multiple, can keep sodium within current limits currently.  .  Essential hypertension -continue amlodipine, labetalol  Hyperlipidemia -continue statin  Urinary retention -Remains with intermittent episodes of retention, but usually able to urinate with some encouragement -Will start on flomax  Dysphagia -due to stroke, has a core track, continue tube feeds.  Family wants PEG. -IR consulted regarding PEG tube, likely to be done 5/11 or 5/12 once he is off COVID isolation. -I have discussed with IR Dr. Archer Asa, patient patient on Brilinta for carotid stent placement, high risk of thrombosis if it stopped, risk of thrombosis outweighed risks of bleed  from procedures, so we will need to continue.  Scheduled Meds: . vitamin C  500 mg Per Tube Daily  . aspirin  81 mg Oral Daily   Or  . aspirin  81 mg Per Tube Daily  . atorvastatin  80 mg Per Tube Daily  . chlorhexidine  15 mL Mouth Rinse BID  . Chlorhexidine Gluconate Cloth  6 each Topical Daily  . feeding supplement  (PROSource TF)  45 mL Per Tube TID  . free water  300 mL Per Tube Q4H  . heparin injection (subcutaneous)  5,000 Units Subcutaneous Q8H  . mouth rinse  15 mL Mouth Rinse q12n4p  . pantoprazole sodium  40 mg Per Tube Daily  . senna-docusate  1 tablet Per Tube Daily  . ticagrelor  90 mg Oral BID   Or  . ticagrelor  90 mg Per Tube BID  . zinc sulfate  220 mg Oral Daily   Continuous Infusions: . feeding supplement (OSMOLITE 1.5 CAL) 1,000 mL (02/11/21 2142)   PRN Meds:.acetaminophen **OR** acetaminophen (TYLENOL) oral liquid 160 mg/5 mL **OR** acetaminophen, albuterol, labetalol  Diet Orders (From admission, onward)    Start     Ordered   01/25/21 0300  Diet NPO time specified  Diet effective now        01/25/21 0259          DVT prophylaxis: heparin injection 5,000 Units Start: 02/07/21 1400 SCD's Start: 01/25/21 0006, will start on [place] heparin, no contraindication as discussed with neurology Dr. Roda Shutters    Code Status: Full Code  Family Communication: Discussed with wife by phone 5/9  Status is: Inpatient  Remains inpatient appropriate because:Inpatient level of care appropriate due to severity of illness   Dispo: The patient is from: Home              Anticipated d/c is to: CIR              Patient currently is not medically stable to d/c.will need PEG.and off Covid isolation before CIR   Difficult to place patient No   Level of care: Progressive  Consultants:  Neurology   Procedures:  2D echo  Microbiology  1/4 bottles-staph epi blood cultures 4/28 SARS-CoV-2 + on 4/30  Antimicrobials: Cefepime 4/30 >> 5/1 Unasyn 5/1 >> 5/3 Remdesivir 4/30 >>  Objective: Vitals:   02/12/21 0256 02/12/21 0300 02/12/21 0824 02/12/21 1150  BP:  132/88 106/62 134/77  Pulse:   63 77  Resp:   17 19  Temp:  98 F (36.7 C) 98.2 F (36.8 C) 98.1 F (36.7 C)  TempSrc:  Oral Oral Oral  SpO2:   94% 100%  Weight: 75.8 kg     Height:        Intake/Output Summary (Last 24 hours)  at 02/12/2021 1438 Last data filed at 02/12/2021 0300 Gross per 24 hour  Intake --  Output 2330 ml  Net -2330 ml   Filed Weights   02/10/21 0346 02/11/21 0328 02/12/21 0256  Weight: 76.2 kg 76.9 kg 75.8 kg    Examination:   Awake Alert, more coherent, and appropriate today, follow commands and trying to answer questions appropriately, he remains with significant left-sided weakness Symmetrical Chest wall movement, Good air movement bilaterally, CTAB RRR,No Gallops,Rubs or new Murmurs, No Parasternal Heave +ve B.Sounds, Abd Soft, No tenderness, No rebound - guarding or rigidity. No Cyanosis, Clubbing or edema, No new Rash or bruise    Data Reviewed: I have independently reviewed following labs and  imaging studies   CBC: Recent Labs  Lab 02/06/21 0526 02/07/21 0234 02/08/21 0347  WBC 4.8 7.4 8.5  NEUTROABS 2.3 4.8 5.4  HGB 9.3* 9.6* 9.6*  HCT 30.7* 31.3* 29.9*  MCV 96.2 94.6 92.9  PLT 392 389 386   Basic Metabolic Panel: Recent Labs  Lab 02/06/21 0526 02/07/21 0234 02/08/21 0347 02/11/21 0239 02/12/21 0819  NA 152* 145 140 137 136  K 3.6 3.6 3.5 3.7 4.0  CL 122* 118* 109 105 106  CO2 24 24 24 25 23   GLUCOSE 160* 156* 122* 128* 155*  BUN 27* 23 21 17 18   CREATININE 0.87 0.79 0.71 0.69 0.73  CALCIUM 8.0* 8.0* 7.9* 8.3* 8.4*  MG 2.7* 2.3 2.3  --   --    Liver Function Tests: Recent Labs  Lab 02/06/21 0526 02/07/21 0234 02/08/21 0347  AST 88* 75* 63*  ALT 107* 105* 94*  ALKPHOS 120 111 100  BILITOT 1.0 0.9 1.0  PROT 5.5* 5.5* 5.2*  ALBUMIN 2.3* 2.5* 2.4*   Coagulation Profile: No results for input(s): INR, PROTIME in the last 168 hours. HbA1C: No results for input(s): HGBA1C in the last 72 hours. CBG: Recent Labs  Lab 02/11/21 1540 02/11/21 1927 02/11/21 2310 02/12/21 0308 02/12/21 0829  GLUCAP 166* 128* 141* 135* 129*    Recent Results (from the past 240 hour(s))  MRSA PCR Screening     Status: None   Collection Time: 02/02/21 11:30 PM    Specimen: Nasal Mucosa; Nasopharyngeal  Result Value Ref Range Status   MRSA by PCR NEGATIVE NEGATIVE Final    Comment:        The GeneXpert MRSA Assay (FDA approved for NASAL specimens only), is one component of a comprehensive MRSA colonization surveillance program. It is not intended to diagnose MRSA infection nor to guide or monitor treatment for MRSA infections. Performed at [facility], [address] [place], [address] [place]   Resp Panel by RT-PCR (Flu A&B, Covid) Nasopharyngeal Swab     Status: Abnormal   Collection Time: 02/03/21  9:38 AM   Specimen: Nasopharyngeal Swab; Nasopharyngeal(NP) swabs in vial transport medium  Result Value Ref Range Status   SARS Coronavirus 2 by RT PCR POSITIVE (A) NEGATIVE Final    Comment: RESULT CALLED TO, READ BACK BY AND VERIFIED WITH: A,PENNINGTON RN @1046  02/03/21 EB (NOTE) SARS-CoV-2 target nucleic acids are DETECTED.  The SARS-CoV-2 RNA is generally detectable in upper respiratory specimens during the acute phase of infection. Positive results are indicative of the presence of the identified virus, but do not rule out bacterial infection or co-infection with other pathogens not detected by the test. Clinical correlation with patient history and other diagnostic information is necessary to determine patient infection status. The expected result is Negative.  Fact Sheet for Patients: 02/05/21  Fact Sheet for Healthcare Providers:  This test is not yet approved or cleared by the 02/05/21 FDA and  has been authorized for detection and/or diagnosis of SARS-CoV-2 by FDA under an Emergency Use Authorization (EUA).  This EUA will remain in effect (meaning this test can be  used) for the duration of  the COVID-19 declaration under Section 564(b)(1) of the Act, 21 U.S.C. section 360bbb-3(b)(1), unless the authorization is terminated or revoked  sooner.     Influenza A by PCR NEGATIVE NEGATIVE Final   Influenza B by PCR NEGATIVE NEGATIVE Final    Comment: (NOTE) The Xpert Xpress SARS-CoV-2/FLU/RSV plus assay is intended as an  aid in the diagnosis of influenza from Nasopharyngeal swab specimens and should not be used as a sole basis for treatment. Nasal washings and aspirates are unacceptable for Xpert Xpress SARS-CoV-2/FLU/RSV testing.  Fact Sheet for Patients: BloggerCourse.comhttps://www.fda.gov/media/152166/download  Fact Sheet for Healthcare Providers: SeriousBroker.ithttps://www.fda.gov/media/152162/download  This test is not yet approved or cleared by the [place] FDA and has been authorized for detection and/or diagnosis of SARS-CoV-2 by FDA under an Emergency Use Authorization (EUA). This EUA will remain in effect (meaning this test can be used) for the duration of the COVID-19 declaration under Section 564(b)(1) of the Act, 21 U.S.C. section 360bbb-3(b)(1), unless the authorization is terminated or revoked.  Performed at [facility], [address] [place], [place] [address]      Radiology Studies: No results found.  Huey Bienenstockawood Lynsey Ange MD [facility]  Between 7 am - 7 pm I am available, please contact me via Amion (for emergencies) or Securechat (non urgent messages)  Between 7 pm - 7 am I am not available, please contact night coverage MD/APP via Amion

## 2021-02-13 LAB — CBC
HCT: 39.4 % (ref 39.0–52.0)
Hemoglobin: 13.1 g/dL (ref 13.0–17.0)
MCH: 30.3 pg (ref 26.0–34.0)
MCHC: 33.2 g/dL (ref 30.0–36.0)
MCV: 91.2 fL (ref 80.0–100.0)
Platelets: 308 10*3/uL (ref 150–400)
RBC: 4.32 MIL/uL (ref 4.22–5.81)
RDW: 15.3 % (ref 11.5–15.5)
WBC: 4.3 10*3/uL (ref 4.0–10.5)
nRBC: 0.5 % — ABNORMAL HIGH (ref 0.0–0.2)

## 2021-02-13 LAB — GLUCOSE, CAPILLARY
Glucose-Capillary: 106 mg/dL — ABNORMAL HIGH (ref 70–99)
Glucose-Capillary: 123 mg/dL — ABNORMAL HIGH (ref 70–99)
Glucose-Capillary: 121 mg/dL — ABNORMAL HIGH (ref 70–99)
Glucose-Capillary: 103 mg/dL — ABNORMAL HIGH (ref 70–99)
Glucose-Capillary: 120 mg/dL — ABNORMAL HIGH (ref 70–99)
Glucose-Capillary: 117 mg/dL — ABNORMAL HIGH (ref 70–99)

## 2021-02-13 NOTE — Progress Notes (Signed)
MD made aware for patient's bladder scan of . He said to encourage patient to void and keep an eye on output.

## 2021-02-13 NOTE — Plan of Care (Signed)

## 2021-02-13 NOTE — Progress Notes (Signed)
PROGRESS NOTE  Leonard [name] [facility]:[id number] DOB: [birth year] DOA: 01/24/2021 PCP: Pcp, No   LOS: 19 days   Brief Narrative / Interim history:  65 year old male with no significant history came into the hospital with left-sided weakness, left facial droop and dysarthria.  He was found to have an acute right MCA territory infarct status post tPA and bilateral common carotid arteriograms followed by right MCA/ACA/ICA thrombus retrieval with stent angioplasty.  He was admitted in the neuro ICU initially.  Hospitalist service was consulted on 4/29 due to increased work of breathing, he was diagnosed with Covid  Subjective / 24h Interval events:  Shunt remains with intermittent urinary retention, but able to urinate without intervention with some encouragement, otherwise no significant events overnight.  Assessment & Plan:  Acute hypoxic respiratory failure due to Covid 19 pneumonia, possible aspiration pneumonia with sepsis physiology  -patient has been having increased work of breathing for few days per wife but on 4/29 much more so and hospitalist service was consulted.  He was placed on antibiotics due to concern for aspiration given poor mental status. -Chest x-ray with developing multifocal pneumonia.  COVID was sent 4/30 and returned positive -Status post Actemra on 4/30, also placed on Remdesivir along with steroids.  Bilateral status has improved, back to baseline, he is on room air.   -Procalcitonin relatively low, but cannot fully rule out ongoing aspiration.  Completed 5 days of antibiotics with Unasyn, discontinued today. - unfortunately he is unvaccinated -Patient has fluctuating mental status due to CVA, repeat  work-up 5/4 and then with no acute findings EKG, repeat  CTA HEAD and  EEG. -Patient will moderate COVID-19 infection, as his oxygen requirement was up to 6 L, this has improved rapidly with treatment, he is on room air for last few days, he will need total 10 days of isolation  from diagnosis day as I have discussed with ID.  -Today's last day of isolation, as of tomorrow 5/11 he is off isolation.  COVID-19 Labs  No results for input(s): DDIMER, FERRITIN, LDH, CRP in the last 72 hours.  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  His MRI on admission showed large acute infarct of the right MCA territory involving the frontal operculum, insula and basal ganglia with hemorrhagic conversion.  A 2D echo showed EF 60-65%, mild LVH.  LDL is 126.  A1c is 5.8.  He is currently on aspirin and Brilinta.  Has a core track for feeding and family agrees to PEG but will need to get over Covid first. -Repeat CT scan stable -PT recommends CIR, will need to be off isolation  Cerebral edema  -Initially on admission, discussed with neurology, at this point no need keep sodium on the higher side..  Essential hypertension -continue amlodipine, labetalol  Hyperlipidemia -continue statin  Urinary retention -Remains with intermittent episodes of retention, but usually able to urinate with some encouragement -Unable to do Flomax via tube, so he is started on doxazosin  Dysphagia -due to stroke, has a core track, continue tube feeds.  Family wants PEG. -IR consulted regarding PEG tube, likely to be done 5/11 or 5/12 once he is off COVID isolation. -I have discussed with IR Dr. Archer Asa, patient patient on Brilinta for carotid stent placement, high risk of thrombosis if it stopped, risk  of thrombosis outweighed risks of bleed from procedures, so we will need to continue.(Alternate option is to keep on heparin GTT while off Brilinta, around PEG placement time, and he can be switched back to Brilinta Heparin GTT as discussed with neurology)  Scheduled Meds: . vitamin  C  500 mg Per Tube Daily  . aspirin  81 mg Oral Daily   Or  . aspirin  81 mg Per Tube Daily  . atorvastatin  80 mg Per Tube Daily  . chlorhexidine  15 mL Mouth Rinse BID  . Chlorhexidine Gluconate Cloth  6 each Topical Daily  . doxazosin  2 mg Per Tube QHS  . feeding supplement (PROSource TF)  45 mL Per Tube TID  . free water  300 mL Per Tube Q4H  . heparin injection (subcutaneous)  5,000 Units Subcutaneous Q8H  . mouth rinse  15 mL Mouth Rinse q12n4p  . pantoprazole sodium  40 mg Per Tube Daily  . senna-docusate  1 tablet Per Tube Daily  . ticagrelor  90 mg Oral BID   Or  . ticagrelor  90 mg Per Tube BID  . zinc sulfate  220 mg Oral Daily   Continuous Infusions: . feeding supplement (OSMOLITE 1.5 CAL) 1,000 mL (02/11/21 2142)   PRN Meds:.acetaminophen **OR** acetaminophen (TYLENOL) oral liquid 160 mg/5 mL **OR** acetaminophen, albuterol, labetalol  Diet Orders (From admission, onward)    Start     Ordered   01/25/21 0300  Diet NPO time specified  Diet effective now        01/25/21 0259          DVT prophylaxis: heparin injection 5,000 Units Start: 02/07/21 1400 SCD's Start: 01/25/21 0006,   Code Status: Full Code  Family Communication: Discussed with wife by phone 5/9  Status is: Inpatient  Remains inpatient appropriate because:Inpatient level of care appropriate due to severity of illness   Dispo: The patient is from: Home              Anticipated d/c is to: CIR              Patient currently is not medically stable to d/c.will need PEG.and off Covid isolation before CIR   Difficult to place patient No   Level of care: Progressive  Consultants:  Neurology  IR  Procedures:  2D echo  Microbiology  1/4 bottles-staph epi blood cultures 4/28 SARS-CoV-2 + on 4/30  Antimicrobials: Cefepime 4/30 >> 5/1 Unasyn 5/1 >> 5/3 Remdesivir 4/30 >>  Objective: Vitals:   02/13/21 0347 02/13/21 0456 02/13/21 0759 02/13/21 1217  BP: 131/78  128/78 122/61  Pulse: 67   93   Resp: 18  18 18   Temp: 98.6 F (37 C)  97.7 F (36.5 C) 97.7 F (36.5 C)  TempSrc: Oral  Oral Oral  SpO2: 96%     Weight:  75.8 kg    Height:        Intake/Output Summary (Last 24 hours) at 02/13/2021 1336 Last data filed at 02/13/2021 0400 Gross per 24 hour  Intake --  Output 2470 ml  Net -2470 ml   Filed Weights   02/11/21 0328 02/12/21 0256 02/13/21 0456  Weight: 76.9 kg 75.8 kg 75.8 kg    Examination:  Patient is awake, follows some commands, more interactive  Symmetrical Chest wall movement, Good air movement bilaterally, CTAB RRR,No Gallops,Rubs or new Murmurs, No Parasternal Heave +ve B.Sounds, Abd Soft, he has some suprapubic fullness, No rebound - guarding or rigidity. No Cyanosis,  Clubbing or edema, No new Rash or bruise    Data Reviewed: I have independently reviewed following labs and imaging studies   CBC: Recent Labs  Lab 02/07/21 0234 02/08/21 0347 02/13/21 0734  WBC 7.4 8.5 4.3  NEUTROABS 4.8 5.4  --   HGB 9.6* 9.6* 13.1  HCT 31.3* 29.9* 39.4  MCV 94.6 92.9 91.2  PLT 389 386 308   Basic Metabolic Panel: Recent Labs  Lab 02/07/21 0234 02/08/21 0347 02/11/21 0239 02/12/21 0819  NA 145 140 137 136  K 3.6 3.5 3.7 4.0  CL 118* 109 105 106  CO2 24 24 25 23   GLUCOSE 156* 122* 128* 155*  BUN 23 21 17 18   CREATININE 0.79 0.71 0.69 0.73  CALCIUM 8.0* 7.9* 8.3* 8.4*  MG 2.3 2.3  --   --    Liver Function Tests: Recent Labs  Lab 02/07/21 0234 02/08/21 0347  AST 75* 63*  ALT 105* 94*  ALKPHOS 111 100  BILITOT 0.9 1.0  PROT 5.5* 5.2*  ALBUMIN 2.5* 2.4*   Coagulation Profile: No results for input(s): INR, PROTIME in the last 168 hours. HbA1C: No results for input(s): HGBA1C in the last 72 hours. CBG: Recent Labs  Lab 02/12/21 2103 02/13/21 0012 02/13/21 0418 02/13/21 0758 02/13/21 1215  GLUCAP 122* 123* 106* 121* 103*    No results found for this or any previous visit (from the past 240 hour(s)).   Radiology  Studies: No results found.  04/15/21 MD [facility]  Between 7 am - 7 pm I am available, please contact me via Amion (for emergencies) or Securechat (non urgent messages)  Between 7 pm - 7 am I am not available, please contact night coverage MD/APP via Amion

## 2021-02-13 NOTE — Progress Notes (Signed)
Occupational Therapy Treatment Patient Details Name: Leonard [name] MRN: [id number] DOB: [birth year] Today's Date: 02/13/2021    History of present illness 65 y.o. male who presented 4/20 with acute onset L hemiplegia, L facial droop, dysarthria, and L hemineglect. tPA was administered. 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 S/p bil common carotid arteriograms and revascularization of occluded R MCA and R ACA prox and R ICA terminus. ETT 4/20-4/21. COVID + 4/30. No significant PMH.   OT comments  Pt progressing well towards established OT goals. Pt following simple commands consistently and more verbal this session. Pt continues to present with decreased functional use of LUE and inattention to L. Pt performing functional mobility to sink with Min A +2 and then grooming task at sink with Min A for balance. Pt becoming pale and quiet while standing at sink; having pt sit and taking BP. Pt orthostatic with standing. Continue to recommend dc to CIR and will continue to follow acutely as admitted.   BP: 130/77  Supine 86/63 sitting after standing for 2 min 110/83 sitting with feet up after 3 min 70/55 standing 86/70 sitting 87/75 after sitting for 3 minutes 109/81 after sitting with feet up x 3 minutes    Follow Up Recommendations  CIR;Supervision/Assistance - 24 hour    Equipment Recommendations  None recommended by OT    Recommendations for Other Services Rehab consult    Precautions / Restrictions Precautions Precautions: Fall Precaution Comments: L neglect       Mobility Bed Mobility Overal bed mobility: Needs Assistance Bed Mobility: Supine to Sit;Sit to Supine     Supine to sit: Min assist;HOB elevated     General bed mobility comments: Assist to  attend to LUE and to elevate trunk into sitting. Verbal/tactile cues for technique    Transfers Overall transfer level: Needs assistance Equipment used: 1 person hand held assist Transfers: Sit to/from Stand Sit to Stand: Min assist;+2 safety/equipment         General transfer comment: Assist to power up and for stability and management/support of LUE    Balance Overall balance assessment: Needs assistance Sitting-balance support: Feet supported;No upper extremity supported Sitting balance-Leahy Scale: Fair Sitting balance - Comments: distant supervision for static sitting   Standing balance support: Single extremity supported;During functional activity Standing balance-Leahy Scale: Poor Standing balance comment: UE support and min guard for static standing. Standing limited by orthostatic                           ADL either performed or assessed with clinical judgement   ADL Overall ADL's : Needs assistance/impaired     Grooming: Minimal assistance;Standing;Brushing hair Grooming Details (indicate cue type and reason): Pt brushing his hair at the sink with Min A for balance. Pt then becoming quiet and noting his face was pale color. Cuing pt to sit and taking BP; 80/50s                             Functional mobility during ADLs: Minimal assistance;+2 for safety/equipment General ADL Comments: Pt performing grooming at sink; limited by othrostatics     Vision       Perception     Praxis  Cognition Arousal/Alertness: Awake/alert Behavior During Therapy: WFL for tasks assessed/performed;Impulsive Overall Cognitive Status: Impaired/Different from baseline Area of Impairment: Attention;Memory;Following commands;Safety/judgement;Awareness;Problem solving                   Current Attention Level: Sustained Memory: Decreased recall of precautions;Decreased short-term memory Following Commands: Follows one step commands  consistently;Follows one step commands with increased time Safety/Judgement: Decreased awareness of safety;Decreased awareness of deficits Awareness: Intellectual Problem Solving: Slow processing;Decreased initiation;Difficulty sequencing;Requires verbal cues;Requires tactile cues General Comments: Pt following simple commands consistently. Pt even being playful and joking. Pt more verbal this session.        Exercises     Shoulder Instructions       General Comments See assessment for BP's. HR 120's with mobility. SpO2 100% on RA.    Pertinent Vitals/ Pain       Pain Assessment: No/denies pain Faces Pain Scale: No hurt  Home Living                                          Prior Functioning/Environment              Frequency  Min 2X/week        Progress Toward Goals  OT Goals(current goals can now be found in the care plan section)  Progress towards OT goals: Progressing toward goals  Acute Rehab OT Goals Patient Stated Goal: to get better/ get out of here OT Goal Formulation: With patient/family Time For Goal Achievement: 02/09/21 Potential to Achieve Goals: Good ADL Goals Pt Will Perform Grooming: with supervision;standing Pt Will Perform Upper Body Bathing: with min guard assist;sitting Pt Will Perform Lower Body Bathing: with min assist;sit to/from stand Pt Will Perform Upper Body Dressing: with supervision;sitting Pt Will Perform Lower Body Dressing: with min assist;sit to/from stand Pt Will Transfer to Toilet: with min guard assist;bedside commode;ambulating Pt Will Perform Toileting - Clothing Manipulation and hygiene: with supervision;sit to/from stand;sitting/lateral leans Additional ADL Goal #1: Pt will locate ADL items in left visual field with Min cues Additional ADL Goal #2: Pt will use LUE as gross assist during ADLs with Min cues  Plan Discharge plan remains appropriate;Frequency remains appropriate    Co-evaluation     PT/OT/SLP Co-Evaluation/Treatment: Yes Reason for Co-Treatment: To address functional/ADL transfers;For patient/therapist safety;Complexity of the patient's impairments (multi-system involvement) PT goals addressed during session: Mobility/safety with mobility OT goals addressed during session: ADL's and self-care      AM-PAC OT "6 Clicks" Daily Activity     Outcome Measure   Help from another person eating meals?: Total Help from another person taking care of personal grooming?: A Little Help from another person toileting, which includes using toliet, bedpan, or urinal?: A Lot Help from another person bathing (including washing, rinsing, drying)?: A Lot Help from another person to put on and taking off regular upper body clothing?: A Lot Help from another person to put on and taking off regular lower body clothing?: A Lot 6 Click Score: 12    End of Session Equipment Utilized During Treatment: Gait belt  OT Visit Diagnosis: Unsteadiness on feet (R26.81);Cognitive communication deficit (R41.841) Symptoms and signs involving cognitive functions: Cerebral infarction   Activity Tolerance Patient tolerated treatment well;Other (comment) (Limited by BP)   Patient Left in chair;with call bell/phone within reach;with chair alarm set   Nurse Communication Mobility status  Time: 2952-8413 OT Time Calculation (min): 38 min  Charges: OT General Charges $OT Visit: 1 Visit OT Treatments $Self Care/Home Management : 8-22 mins  Quanasia Defino MSOT, OTR/L Acute Rehab Pager: [phone] Office: [phone]   Theodoro Grist Vianka Ertel 02/13/2021, 4:58 PM

## 2021-02-13 NOTE — Progress Notes (Signed)
Inpatient Rehab Admissions Coordinator:   Pt. Remains on precautions for covid, set to end 5/11. Pt. Then to have PEG placed 5/13. Pt. Remains a potential candidate for CIR, will follow up once off precautions and PEG is placed.   Megan Salon, MS, CCC-SLP Rehab Admissions Coordinator  [phone] (celll) [phone] (office)

## 2021-02-13 NOTE — Progress Notes (Signed)
   IR aware of this pt and request for G tube placement Still on isolation precautions - to come off 5/11 per chart  Will plan to see pt 5/12 for 5/13 G tube placement procedure in IR

## 2021-02-13 NOTE — Progress Notes (Signed)
Physical Therapy Treatment Patient Details Name: Leonard [name] MRN: [id number] DOB: 01/08/1956 Today's Date: 02/13/2021    History of Present Illness 65 y.o. male who presented 4/20 with acute onset L hemiplegia, L facial droop, dysarthria, and L hemineglect. tPA was administered. 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 S/p bil common carotid arteriograms and revascularization of occluded R MCA and R ACA prox and R ICA terminus. ETT 4/20-4/21. COVID + 4/30. No significant PMH.    PT Comments    Pt making progress with mobility but today limited by orthostatic hypotension. In standing at sink pt became less responsive. After sitting incr responsiveness. Stood again and checked standing BP. Only tolerated ~45 seconds of standing prior to needing to sit.   130/77  Supine 86/63 sitting after standing for 2 min 110/83 sitting with feet up after 3 min 70/55 standing 86/70 sitting 87/75 after sitting for 3 minutes 109/81 after sitting with feet up x 3 minutes  Continue to recommend CIR for further therapy.      Follow Up Recommendations  CIR;Supervision/Assistance - 24 hour     Equipment Recommendations  3in1 (PT);Wheelchair (measurements PT);Wheelchair cushion (measurements PT);Other (comment)    Recommendations for Other Services       Precautions / Restrictions Precautions Precautions: Fall Precaution Comments: L neglect    Mobility  Bed Mobility Overal bed mobility: Needs Assistance Bed Mobility: Supine to Sit;Sit to Supine     Supine to sit: Min assist;HOB elevated     General bed mobility comments: Assist to attend to LUE and to elevate trunk into sitting. Verbal/tactile cues for technique    Transfers Overall transfer level: Needs  assistance Equipment used: 1 person hand held assist Transfers: Sit to/from Stand Sit to Stand: Min assist;+2 safety/equipment         General transfer comment: Assist to power up and for stability and management/support of LUE  Ambulation/Gait Ambulation/Gait assistance: +2 safety/equipment;Min assist Gait Distance (Feet): 6 Feet Assistive device: 1 person hand held assist Gait Pattern/deviations: Narrow base of support;Decreased stride length;Step-to pattern Gait velocity: decr Gait velocity interpretation: <1.31 ft/sec, indicative of household ambulator General Gait Details: Assist for balance and support. Amb to sink   Social research officer, government Rankin (Stroke Patients Only) Modified Rankin (Stroke Patients Only) Pre-Morbid Rankin Score: No symptoms Modified Rankin: Moderately severe disability     Balance Overall balance assessment: Needs assistance Sitting-balance support: Feet supported;No upper extremity supported Sitting balance-Leahy Scale: Fair Sitting balance - Comments: distant supervision for static sitting   Standing balance support: Single extremity supported;During functional activity Standing balance-Leahy Scale: Poor Standing balance comment: UE support and min guard for static standing. Standing limited by orthostatic                            Cognition Arousal/Alertness: Awake/alert Behavior During Therapy: WFL for tasks assessed/performed;Impulsive Overall Cognitive Status: Impaired/Different from baseline Area of Impairment: Attention;Memory;Following commands;Safety/judgement;Awareness;Problem solving                   Current Attention Level: Sustained Memory: Decreased recall of precautions;Decreased short-term memory Following Commands: Follows one step commands consistently;Follows one step commands with increased time  Safety/Judgement: Decreased awareness of safety;Decreased awareness of  deficits Awareness: Intellectual Problem Solving: Slow processing;Decreased initiation;Difficulty sequencing;Requires verbal cues;Requires tactile cues        Exercises      General Comments General comments (skin integrity, edema, etc.): See assessment for BP's. HR 120's with mobility. SpO2 100% on RA.      Pertinent Vitals/Pain Pain Assessment: No/denies pain Faces Pain Scale: No hurt    Home Living                      Prior Function            PT Goals (current goals can now be found in the care plan section) Acute Rehab PT Goals Patient Stated Goal: to get better/ get out of here Progress towards PT goals: Progressing toward goals    Frequency    Min 4X/week      PT Plan Current plan remains appropriate    Co-evaluation PT/OT/SLP Co-Evaluation/Treatment: Yes Reason for Co-Treatment: Complexity of the patient's impairments (multi-system involvement);For patient/therapist safety PT goals addressed during session: Mobility/safety with mobility        AM-PAC PT "6 Clicks" Mobility   Outcome Measure  Help needed turning from your back to your side while in a flat bed without using bedrails?: A Little Help needed moving from lying on your back to sitting on the side of a flat bed without using bedrails?: A Little Help needed moving to and from a bed to a chair (including a wheelchair)?: A Little Help needed standing up from a chair using your arms (e.g., wheelchair or bedside chair)?: A Little Help needed to walk in hospital room?: A Lot Help needed climbing 3-5 steps with a railing? : Total 6 Click Score: 15    End of Session Equipment Utilized During Treatment: Gait belt Activity Tolerance: Treatment limited secondary to medical complications (Comment) (orthostatic) Patient left: with call bell/phone within reach;in chair;with chair alarm set Nurse Communication: Mobility status PT Visit Diagnosis: Unsteadiness on feet (R26.81);Other  abnormalities of gait and mobility (R26.89);Muscle weakness (generalized) (M62.81);Difficulty in walking, not elsewhere classified (R26.2);Other symptoms and signs involving the nervous system (R29.898);Hemiplegia and hemiparesis Hemiplegia - Right/Left: Left Hemiplegia - caused by: Cerebral infarction     Time: 1355-1441 PT Time Calculation (min) (ACUTE ONLY): 46 min  Charges:  $Gait Training: 8-22 mins $Therapeutic Activity: 8-22 mins                     [facility] PT Acute Rehabilitation Services Pager [phone] Office [phone]    Angelina Ok [facility] 02/13/2021, 2:55 PM

## 2021-02-14 ENCOUNTER — Inpatient Hospital Stay (HOSPITAL_COMMUNITY): Payer: Medicare HMO

## 2021-02-14 LAB — GLUCOSE, CAPILLARY
Glucose-Capillary: 109 mg/dL — ABNORMAL HIGH (ref 70–99)
Glucose-Capillary: 116 mg/dL — ABNORMAL HIGH (ref 70–99)
Glucose-Capillary: 125 mg/dL — ABNORMAL HIGH (ref 70–99)
Glucose-Capillary: 134 mg/dL — ABNORMAL HIGH (ref 70–99)
Glucose-Capillary: 138 mg/dL — ABNORMAL HIGH (ref 70–99)
Glucose-Capillary: 146 mg/dL — ABNORMAL HIGH (ref 70–99)
Glucose-Capillary: 151 mg/dL — ABNORMAL HIGH (ref 70–99)

## 2021-02-14 LAB — BASIC METABOLIC PANEL
Anion gap: 8 (ref 5–15)
BUN: 17 mg/dL (ref 8–23)
CO2: 25 mmol/L (ref 22–32)
Calcium: 8.5 mg/dL — ABNORMAL LOW (ref 8.9–10.3)
Chloride: 102 mmol/L (ref 98–111)
Creatinine, Ser: 0.79 mg/dL (ref 0.61–1.24)
GFR, Estimated: >60 mL/min (ref >60)
Glucose, Bld: 140 mg/dL — ABNORMAL HIGH (ref 70–99)
Potassium: 4.5 mmol/L (ref 3.5–5.1)
Sodium: 135 mmol/L (ref 135–145)

## 2021-02-14 LAB — CBC
HCT: 37.2 % — ABNORMAL LOW (ref 39.0–52.0)
Hemoglobin: 12 g/dL — ABNORMAL LOW (ref 13.0–17.0)
MCH: 29.8 pg (ref 26.0–34.0)
MCHC: 32.3 g/dL (ref 30.0–36.0)
MCV: 92.3 fL (ref 80.0–100.0)
Platelets: 288 10*3/uL (ref 150–400)
RBC: 4.03 MIL/uL — ABNORMAL LOW (ref 4.22–5.81)
RDW: 15.3 % (ref 11.5–15.5)
WBC: 4 10*3/uL (ref 4.0–10.5)
nRBC: 0.8 % — ABNORMAL HIGH (ref 0.0–0.2)

## 2021-02-14 MED ORDER — ASCORBIC ACID 500 MG PO TABS
500.0000 mg | ORAL_TABLET | Freq: Every day | ORAL | Status: DC
  Administered 2021-02-15: 500 mg via ORAL
  Filled 2021-02-14: qty 1

## 2021-02-14 MED ORDER — DOXAZOSIN MESYLATE 2 MG PO TABS
2.0000 mg | ORAL_TABLET | Freq: Every day | ORAL | Status: DC
  Administered 2021-02-14: 2 mg via ORAL
  Filled 2021-02-14: qty 1

## 2021-02-14 MED ORDER — ZINC SULFATE 220 (50 ZN) MG PO CAPS
220.0000 mg | ORAL_CAPSULE | Freq: Every day | ORAL | Status: DC
  Administered 2021-02-14: 220 mg

## 2021-02-14 MED ORDER — PANTOPRAZOLE SODIUM 40 MG PO TBEC
40.0000 mg | DELAYED_RELEASE_TABLET | Freq: Every day | ORAL | Status: DC
  Administered 2021-02-15: 40 mg via ORAL
  Filled 2021-02-14: qty 1

## 2021-02-14 MED ORDER — ZINC SULFATE 220 (50 ZN) MG PO CAPS
220.0000 mg | ORAL_CAPSULE | Freq: Every day | ORAL | Status: DC
  Administered 2021-02-15: 220 mg via ORAL
  Filled 2021-02-14: qty 1

## 2021-02-14 MED ORDER — ATORVASTATIN CALCIUM 80 MG PO TABS
80.0000 mg | ORAL_TABLET | Freq: Every day | ORAL | Status: DC
  Administered 2021-02-15: 80 mg via ORAL
  Filled 2021-02-14: qty 1

## 2021-02-14 MED ORDER — SENNOSIDES-DOCUSATE SODIUM 8.6-50 MG PO TABS
1.0000 | ORAL_TABLET | Freq: Every day | ORAL | Status: DC
  Administered 2021-02-15: 1 via ORAL
  Filled 2021-02-14: qty 1

## 2021-02-14 NOTE — Plan of Care (Signed)
  Problem: Education: Goal: Knowledge of disease or condition will improve Outcome: Progressing Goal: Knowledge of secondary prevention will improve Outcome: Progressing Goal: Knowledge of patient specific risk factors addressed and post discharge goals established will improve Outcome: Progressing Goal: Individualized Educational Video(s) Outcome: Progressing   Problem: Education: Goal: Knowledge of General Education information will improve Description: Including pain rating scale, medication(s)/side effects and non-pharmacologic comfort measures Outcome: Progressing   Problem: Health Behavior/Discharge Planning: Goal: Ability to manage health-related needs will improve Outcome: Progressing   Problem: Clinical Measurements: Goal: Ability to maintain clinical measurements within normal limits will improve Outcome: Progressing Goal: Will remain free from infection Outcome: Progressing Goal: Diagnostic test results will improve Outcome: Progressing Goal: Respiratory complications will improve Outcome: Progressing Goal: Cardiovascular complication will be avoided Outcome: Progressing   Problem: Activity: Goal: Risk for activity intolerance will decrease Outcome: Progressing   Problem: Nutrition: Goal: Adequate nutrition will be maintained Outcome: Progressing   Problem: Coping: Goal: Level of anxiety will decrease Outcome: Progressing   Problem: Elimination: Goal: Will not experience complications related to bowel motility Outcome: Progressing Goal: Will not experience complications related to urinary retention Outcome: Progressing   Problem: Pain Managment: Goal: General experience of comfort will improve Outcome: Progressing

## 2021-02-14 NOTE — Progress Notes (Signed)
PROGRESS NOTE        PATIENT DETAILS Name: Leonard [name] Age: 65 y.o. Sex: male Date of Birth: August 23, 1956 Admit Date: 01/24/2021 Admitting Physician Caryl Pina, MD PCP:Pcp, No  Brief Narrative: Patient is a 64 y.o. male with no past medical history-who presented with left-sided weakness-found to have acute right MCA territory infarct-s/p tPA-followed by right MCA/ACA/ICA thrombus retrieval/stent angioplasty-initially admitted to the neuro ICU-Hospital course complicated by development of dysphagia requiring tube feedings-and COVID-19 infection.  See below for further details  Significant events:   Significant studies: None  Antimicrobial therapy: None  Microbiology data: None  Procedures : 4/21>> mechanical thrombectomy-right MCA/right ICA-placement of right ICA stent.  Consults: IR, neurology  DVT Prophylaxis : heparin injection 5,000 Units Start: 02/07/21 1400 SCD's Start: 01/25/21 0006  Subjective: Lying comfortably in bed-no chest pain or shortness of breath.   Assessment/Plan: Acute hypoxic respiratory failure due to COVID-19 pneumonia and possible aspiration: Hypoxia has resolved-treated with steroids/remdesivir/Actemra and broad-spectrum antimicrobial therapy.  Note-he is unvaccinated.  Prior MD-Dr. Randol Kern spoke with infectious disease-no need for isolation from 5/11 onwards.  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  Neurology recommending 30-day event monitor as an outpatient to rule out A. fib, and to continue aspirin/Brilinta.  Continues to have mostly left arm weakness.  Acute metabolic encephalopathy: Due to CVA/hypoxia/COVID-19 infection-improved  Dysphagia: Due to above-IR following for potential PEG tube placement.  Currently NG tube feedings ongoing.  HTN: BP stable-continue amlodipine and  labetalol  HLD: Continue statin  Acute urinary retention: Continue doxazosin-voiding trial over the next few days.  Diet: Diet Order            Diet NPO time specified  Diet effective now                  Code Status: Full code   Family Communication: None at bedside  Disposition Plan: Status is: Inpatient  Remains inpatient appropriate because:Inpatient level of care appropriate due to severity of illness   Dispo: The patient is from: Home              Anticipated d/c is to: CIR              Patient currently is not medically stable to d/c.   Difficult to place patient No   Barriers to Discharge: Dysphagia-requires PEG tube placement before consideration of discharge.  Antimicrobial agents: Anti-infectives (From admission, onward)   Start     Dose/Rate Route Frequency Ordered Stop   02/04/21 1115  Ampicillin-Sulbactam (UNASYN) 3 g in sodium chloride 0.9 % 100 mL IVPB  Status:  Discontinued        3 g 200 mL/hr over 30 Minutes Intravenous Every 8 hours 02/04/21 1025 02/06/21 0946   02/04/21 1000  remdesivir 100 mg in sodium chloride 0.9 % 100 mL IVPB       "Followed by" Linked Group Details   100 mg 200 mL/hr over 30 Minutes Intravenous Daily 02/03/21 1101 02/07/21 1002   02/03/21 1200  remdesivir 200 mg in sodium chloride 0.9% 250 mL IVPB       "Followed by" Linked Group Details   200 mg 580 mL/hr over 30 Minutes Intravenous Once 02/03/21 1101 02/03/21 1412   02/02/21 2200  ceFEPIme (MAXIPIME)  2 g in sodium chloride 0.9 % 100 mL IVPB  Status:  Discontinued        2 g 200 mL/hr over 30 Minutes Intravenous Every 8 hours 02/02/21 1909 02/04/21 1020   02/01/21 1730  Ampicillin-Sulbactam (UNASYN) 3 g in sodium chloride 0.9 % 100 mL IVPB  Status:  Discontinued        3 g 200 mL/hr over 30 Minutes Intravenous Every 8 hours 02/01/21 1630 02/02/21 1909   01/24/21 2333  ceFAZolin (ANCEF) 2-4 GM/100ML-% IVPB  Status:  Discontinued       Note to Pharmacy: Ferd Hibbs    : cabinet override      01/24/21 2333 01/25/21 0829       Time spent: 57minutes-Greater than 50% of this time was spent in counseling, explanation of diagnosis, planning of further management, and coordination of care.  MEDICATIONS: Scheduled Meds: . vitamin C  500 mg Per Tube Daily  . aspirin  81 mg Oral Daily   Or  . aspirin  81 mg Per Tube Daily  . atorvastatin  80 mg Per Tube Daily  . chlorhexidine  15 mL Mouth Rinse BID  . Chlorhexidine Gluconate Cloth  6 each Topical Daily  . doxazosin  2 mg Per Tube QHS  . feeding supplement (PROSource TF)  45 mL Per Tube TID  . free water  300 mL Per Tube Q4H  . heparin injection (subcutaneous)  5,000 Units Subcutaneous Q8H  . mouth rinse  15 mL Mouth Rinse q12n4p  . pantoprazole sodium  40 mg Per Tube Daily  . senna-docusate  1 tablet Per Tube Daily  . ticagrelor  90 mg Oral BID   Or  . ticagrelor  90 mg Per Tube BID  . zinc sulfate  220 mg Oral Daily   Continuous Infusions: . feeding supplement (OSMOLITE 1.5 CAL) 60 mL/hr at 02/14/21 0600   PRN Meds:.acetaminophen **OR** acetaminophen (TYLENOL) oral liquid 160 mg/5 mL **OR** acetaminophen, albuterol, labetalol   PHYSICAL EXAM: Vital signs: Vitals:   02/14/21 0036 02/14/21 0406 02/14/21 0419 02/14/21 0748  BP: 121/68 115/78  112/73  Pulse:    83  Resp:  20  18  Temp: 98.4 F (36.9 C) 98.5 F (36.9 C)  98.3 F (36.8 C)  TempSrc: Oral Oral  Oral  SpO2:    98%  Weight:   76 kg   Height:       Filed Weights   02/12/21 0256 02/13/21 0456 02/14/21 0419  Weight: 75.8 kg 75.8 kg 76 kg   Body mass index is 24.74 kg/m.   Gen Exam:Alert awake-not in any distress HEENT:atraumatic, normocephalic Chest: B/L clear to auscultation anteriorly CVS:S1S2 regular Abdomen:soft non tender, non distended Extremities:no edema Neurology: Left arm weakness Skin: no rash  I have personally reviewed following labs and imaging studies  LABORATORY DATA: CBC: Recent Labs  Lab  02/08/21 0347 02/13/21 0734 02/14/21 0256  WBC 8.5 4.3 4.0  NEUTROABS 5.4  --   --   HGB 9.6* 13.1 12.0*  HCT 29.9* 39.4 37.2*  MCV 92.9 91.2 92.3  PLT 386 308 288    Basic Metabolic Panel: Recent Labs  Lab 02/08/21 0347 02/11/21 0239 02/12/21 0819 02/14/21 0256  NA 140 137 136 135  K 3.5 3.7 4.0 4.5  CL 109 105 106 102  CO2 24 25 23 25   GLUCOSE 122* 128* 155* 140*  BUN 21 17 18 17   CREATININE 0.71 0.69 0.73 0.79  CALCIUM 7.9* 8.3* 8.4* 8.5*  MG 2.3  --   --   --     GFR: Estimated Creatinine Clearance: 92.1 mL/min (by C-G formula based on SCr of 0.79 mg/dL).  Liver Function Tests: Recent Labs  Lab 02/08/21 0347  AST 63*  ALT 94*  ALKPHOS 100  BILITOT 1.0  PROT 5.2*  ALBUMIN 2.4*   No results for input(s): LIPASE, AMYLASE in the last 168 hours. No results for input(s): AMMONIA in the last 168 hours.  Coagulation Profile: No results for input(s): INR, PROTIME in the last 168 hours.  Cardiac Enzymes: No results for input(s): CKTOTAL, CKMB, CKMBINDEX, TROPONINI in the last 168 hours.  BNP (last 3 results) No results for input(s): PROBNP in the last 8760 hours.  Lipid Profile: No results for input(s): CHOL, HDL, LDLCALC, TRIG, CHOLHDL, LDLDIRECT in the last 72 hours.  Thyroid Function Tests: No results for input(s): TSH, T4TOTAL, FREET4, T3FREE, THYROIDAB in the last 72 hours.  Anemia Panel: No results for input(s): VITAMINB12, FOLATE, FERRITIN, TIBC, IRON, RETICCTPCT in the last 72 hours.  Urine analysis:    Component Value Date/Time   COLORURINE YELLOW 02/01/2021 1604   APPEARANCEUR HAZY (A) 02/01/2021 1604   LABSPEC 1.019 02/01/2021 1604   PHURINE 6.0 02/01/2021 1604   GLUCOSEU NEGATIVE 02/01/2021 1604   HGBUR NEGATIVE 02/01/2021 1604   BILIRUBINUR NEGATIVE 02/01/2021 1604   KETONESUR NEGATIVE 02/01/2021 1604   PROTEINUR NEGATIVE 02/01/2021 1604   NITRITE NEGATIVE 02/01/2021 1604   LEUKOCYTESUR NEGATIVE 02/01/2021 1604    Sepsis  Labs: Lactic Acid, Venous No results found for: LATICACIDVEN  MICROBIOLOGY: No results found for this or any previous visit (from the past 240 hour(s)).  RADIOLOGY STUDIES/RESULTS: No results found.   LOS: 20 days   Jeoffrey Massed, MD  [facility]    To contact the attending provider between 7A-7P or the covering provider during after hours 7P-7A, please log into the web site [email or web address] and access using universal [place] password for that web site. If you do not have the password, please call the hospital operator.  02/14/2021, 10:35 AM

## 2021-02-14 NOTE — Plan of Care (Signed)

## 2021-02-14 NOTE — Consult Note (Signed)
Chief Complaint: Patient was seen in consultation today for dysphagia/percutaneous gastrostomy tube placement.  Referring Physician(s): Elgergawy, Leana Roe [facility])  Supervising Physician: Gilmer Mor  Patient Status: [facility] - In-pt  History of Present Illness: Leonard [name] is a 65 y.o. male with an unremarkable past medical history who has been admitted to [facility] since 01/24/2021 for management of acute CVA. He underwent an image-guided cerebral arteriogram with emergent mechanical thrombectomy of right MCA, proximal right ACA, and right ICA terminus occlusions achieving a TICI 3 revascularization, along with revascularization of proximal right ICA acute occlusion using stent assisted angioplasty via right femoral approach 01/25/2021 by Dr. Corliss Skains. Hospital course complicated by acute hypoxic respiratory failure secondary to COVID-19 pneumonia (off COVID-19 precautions 02/14/2021), intermittent urinary retention, hypertension, and residual dysphagia. He currently has NGT in place but requires additional means of nutritional support.  IR requested by Dr. Randol Kern for possible image-guided percutaneous gastrostomy tube placement. Patient awake and alert laying in bed. Follows simple commands. Speech slightly dysarthric. Alert to person and place but not time- history difficult to obtain secondary to this.  On DAPT (Brilinta/Aspirin). On SQ Heparin.   History reviewed. No pertinent past medical history.  Past Surgical History:  Procedure Laterality Date  . IR ANGIO INTRA EXTRACRAN SEL COM CAROTID INNOMINATE UNI L MOD SED  01/25/2021  . IR CT HEAD LTD  01/25/2021  . IR CT HEAD LTD  01/25/2021  . IR PERCUTANEOUS ART THROMBECTOMY/INFUSION INTRACRANIAL INC DIAG ANGIO  01/25/2021  . RADIOLOGY WITH ANESTHESIA N/A 01/24/2021   Procedure: IR WITH ANESTHESIA;  Surgeon: Julieanne Cotton, MD;  Location: [facility] OR;  Service: Radiology;  Laterality: N/A;    Allergies: Patient has no known  allergies.  Medications: Prior to Admission medications   Medication Sig Start Date End Date Taking? Authorizing Provider  acetaminophen (TYLENOL) 500 MG tablet Take 1,000 mg by mouth every 6 (six) hours as needed for mild pain.   Yes [provider]  aspirin EC 81 MG tablet Take 81 mg by mouth daily. Swallow whole.   Yes [provider]     History reviewed. No pertinent family history.  Social History   Socioeconomic History  . Marital status: Unknown    Spouse name: Not on file  . Number of children: Not on file  . Years of education: Not on file  . Highest education level: Not on file  Occupational History  . Not on file  Tobacco Use  . Smoking status: Never Smoker  . Smokeless tobacco: Never Used  Vaping Use  . Vaping Use: Never used  Substance and Sexual Activity  . Alcohol use: Never  . Drug use: Never  . Sexual activity: Never  Other Topics Concern  . Not on file  Social History Narrative  . Not on file   Social Determinants of Health   Financial Resource Strain: Not on file  Food Insecurity: Not on file  Transportation Needs: Not on file  Physical Activity: Not on file  Stress: Not on file  Social Connections: Not on file     Review of Systems: A 12 point ROS discussed and pertinent positives are indicated in the HPI above.  All other systems are negative.  Review of Systems  Unable to perform ROS: Mental status change    Vital Signs: BP 112/73 (BP Location: Right Arm)   Pulse 83   Temp 98.3 F (36.8 C) (Oral)   Resp 18   Ht 5\' 9"  (1.753 m)   Wt  167 lb 8.8 oz (76 kg)   SpO2 98%   BMI 24.74 kg/m   Physical Exam Vitals and nursing note reviewed.  Constitutional:      General: He is not in acute distress. HENT:     Nose:     Comments: (+) NGT. Cardiovascular:     Rate and Rhythm: Normal rate and regular rhythm.     Heart sounds: Normal heart sounds. No murmur heard.   Pulmonary:     Effort: Pulmonary effort is  normal. No respiratory distress.     Breath sounds: Normal breath sounds. No wheezing.  Abdominal:     Comments: Abdomen without rash, wound, erythema, drainage, or active bleeding.  Skin:    General: Skin is warm and dry.  Neurological:     Mental Status: He is alert.     Comments: Oriented to person and place but not time.      MD Evaluation Airway: WNL Heart: WNL Abdomen: WNL Chest/ Lungs: WNL ASA  Classification: 3 Mallampati/Airway Score: Two   Imaging: CT ABDOMEN WO CONTRAST  Result Date: 02/10/2021 CLINICAL DATA:  Assess anatomy for possible gastrostomy tube placement. History of cerebrovascular accident. EXAM: CT ABDOMEN WITHOUT CONTRAST TECHNIQUE: Multidetector CT imaging of the abdomen was performed following the standard protocol without IV contrast. COMPARISON:  None. FINDINGS: Lower chest: Nonspecific patchy ground-glass attenuation airspace opacities in the visualized lower lungs bilaterally. Visualized cardiac structures are normal in size. No pericardial effusion. Unremarkable distal thoracic esophagus conveying a feeding tube. Hepatobiliary: No focal liver abnormality is seen. No gallstones, gallbladder wall thickening, or biliary dilatation. Pancreas: Unremarkable. No pancreatic ductal dilatation or surrounding inflammatory changes. Spleen: Normal in size without focal abnormality. Adrenals/Urinary Tract: Adrenal glands are unremarkable. Kidneys are normal, without renal calculi, focal lesion, or hydronephrosis. Stomach/Bowel: Normal gastric anatomy. Transpyloric feeding tube tip is present in the proximal duodenum. No significant colonic interposition anterior to the stomach. Vascular/Lymphatic: Mild atherosclerotic calcification within the abdominal aorta. No suspicious lymphadenopathy. Other: No abdominal wall hernia or abnormality. Musculoskeletal: No acute fracture or aggressive appearing lytic or blastic osseous lesion. IMPRESSION: Anatomy is suitable for percutaneous  gastrostomy tube placement. Electronically Signed   By: Malachy Moan M.D.   On: 02/10/2021 08:56   DG Abd 1 View  Result Date: 01/25/2021 CLINICAL DATA:  OG tube placement EXAM: ABDOMEN - 1 VIEW COMPARISON:  None. FINDINGS: Esophageal tube side-port at or slightly above GE junction, suggest further advancement by 5-10 cm for more optimal positioning. Nonobstructed gas pattern. IMPRESSION: Esophageal tube side-port at or slightly above the GE junction, suggest further advancement by 5-10 cm for more optimal positioning. These results will be called to the ordering clinician or representative by the Radiologist Assistant, and communication documented in the PACS or [employer]. Electronically Signed   By: Jasmine Pang M.D.   On: 01/25/2021 20:45   CT HEAD WO CONTRAST  Result Date: 02/07/2021 CLINICAL DATA:  History of right MCA infarct. EXAM: CT HEAD WITHOUT CONTRAST TECHNIQUE: Contiguous axial images were obtained from the base of the skull through the vertex without intravenous contrast. COMPARISON:  Head CT scan 02/04/2021 and 01/27/2021. FINDINGS: Brain: Hypoattenuation in the right MCA territories consistent evolution of the patient's known infarct. No hemorrhagic transformation. No new infarct, midline shift, hydrocephalus, mass or abnormal extra-axial fluid collection. Vascular: No hyperdense vessel or unexpected calcification. Skull: Intact.  No focal lesion. Sinuses/Orbits: NG tube is noted. Mild mucosal thickening sphenoid sinuses seen scratch the mild mucosal thickening left sphenoid sinus.  Other: None. IMPRESSION: No acute intracranial abnormality. Continued evolution of the patient's known right MCA territory infarct. Electronically Signed   By: Drusilla Kanner M.D.   On: 02/07/2021 14:49   CT HEAD WO CONTRAST  Result Date: 02/04/2021 CLINICAL DATA:  Follow-up stroke. Right MCA territory stroke in late April. EXAM: CT HEAD WITHOUT CONTRAST TECHNIQUE: Contiguous axial images were  obtained from the base of the skull through the vertex without intravenous contrast. COMPARISON:  01/27/2021 FINDINGS: Brain: No abnormality affects the brainstem or cerebellum. Left cerebral hemisphere again appears normal. Subacute infarction in the right MCA territory affecting the insula and frontal operculum. Less swelling. Small areas of hemorrhage within the stroke are becoming less dense. No evidence of new or increasing hemorrhage. Right-to-left shift now only 1 or 2 mm. No new ischemic insult. No hydrocephalus or extra-axial collection. Vascular: No abnormal vascular finding. Skull: Normal Sinuses/Orbits: Clear/normal. Other: None IMPRESSION: Expected evolutionary changes of infarction in the right middle cerebral artery territory which was acute in a parole. Low-density persists but with diminished swelling. Areas of hemorrhage within the substance of the infarction are becoming less dense. No new or increasing hemorrhage. Diminishing right to left midline shift, only about 2 mm presently. Electronically Signed   By: Paulina Fusi M.D.   On: 02/04/2021 16:01   CT HEAD WO CONTRAST  Result Date: 01/27/2021 CLINICAL DATA:  Stroke follow-up EXAM: CT HEAD WITHOUT CONTRAST TECHNIQUE: Contiguous axial images were obtained from the base of the skull through the vertex without intravenous contrast. COMPARISON:  01/26/2021 FINDINGS: Brain: Continued evolution of right MCA territory infarct. No worsened midline shift. Unchanged multifocal intraparenchymal and subarachnoid blood. Vascular: No hyperdense vessel or unexpected calcification. Skull: Normal. Negative for fracture or focal lesion. Sinuses/Orbits: No acute finding. Other: None. IMPRESSION: Expected evolution of right MCA territory infarct with unchanged multifocal intraparenchymal and subarachnoid blood. No worsened midline shift. Electronically Signed   By: Deatra Robinson M.D.   On: 01/27/2021 03:17   CT HEAD WO CONTRAST  Result Date:  01/26/2021 CLINICAL DATA:  Stroke follow-up EXAM: CT HEAD WITHOUT CONTRAST TECHNIQUE: Contiguous axial images were obtained from the base of the skull through the vertex without intravenous contrast. COMPARISON:  Head CT 01/24/2021 FINDINGS: Brain: Large area of hypoattenuation at the site of known right MCA territory infarct. There is a focus of parenchymal hyperdensity at the posterior aspect of the infarct site. There is a small amount of subarachnoid hyperdensity over the right frontal convexity. Vascular: No hyperdense vessel or unexpected calcification. Skull: Normal. Negative for fracture or focal lesion. Sinuses/Orbits: No acute finding. Other: None. IMPRESSION: 1. Large area of hypoattenuation at the site of known right MCA territory infarct. Small focus of parenchymal hyperdensity at the posterior aspect of the infarct site Viele indicate a small amount of petechial hemorrhage or contrast extravasation from earlier procedure. 2. Small amount of subarachnoid hyperdensity over the right frontal convexity, either subarachnoid blood or contrast staining. Electronically Signed   By: Deatra Robinson M.D.   On: 01/26/2021 01:19   MR ANGIO HEAD WO CONTRAST  Result Date: 01/25/2021 CLINICAL DATA:  Left hemiplegia and left facial droop EXAM: MRI HEAD WITHOUT CONTRAST MRA HEAD WITHOUT CONTRAST TECHNIQUE: Multiplanar, multiecho pulse sequences of the brain and surrounding structures were obtained without intravenous contrast. Angiographic images of the head were obtained using MRA technique without contrast. COMPARISON:  None. FINDINGS: MRI HEAD FINDINGS Brain: Large acute infarct of the right MCA territory involving the frontal operculum, insula and basal ganglia.  There are punctate foci of acute ischemia in the right temporal lobe and right thalamus. Vascular: Major flow voids are preserved. Skull and upper cervical spine: Normal calvarium and skull base. Visualized upper cervical spine and soft tissues are normal.  Sinuses/Orbits:No paranasal sinus fluid levels or advanced mucosal thickening. No mastoid or middle ear effusion. Normal orbits. MRA HEAD FINDINGS POSTERIOR CIRCULATION: --Vertebral arteries: Normal --Inferior cerebellar arteries: Normal. --Basilar artery: Normal. --Superior cerebellar arteries: Normal. --Posterior cerebral arteries: Normal. ANTERIOR CIRCULATION: --Intracranial internal carotid arteries: Normal. --Anterior cerebral arteries (ACA): Normal. --Middle cerebral arteries (MCA): Normal. ANATOMIC VARIANTS: None IMPRESSION: 1. 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 No hemorrhage or mass effect. 2. Punctate foci of acute ischemia in the right temporal lobe and right thalamus. 3. Normal intracranial MRA. Electronically Signed   By: Deatra RobinsonKevin  Herman M.D.   On: 01/25/2021 23:32   MR BRAIN WO CONTRAST  Result Date: 01/25/2021 CLINICAL DATA:  Left hemiplegia and left facial droop EXAM: MRI HEAD WITHOUT CONTRAST MRA HEAD WITHOUT CONTRAST TECHNIQUE: Multiplanar, multiecho pulse sequences of the brain and surrounding structures were obtained without intravenous contrast. Angiographic images of the head were obtained using MRA technique without contrast. COMPARISON:  None. FINDINGS: MRI HEAD FINDINGS Brain: Large acute infarct of the right MCA territory involving the frontal operculum, insula and basal ganglia. There are punctate foci of acute ischemia in the right temporal lobe and right thalamus. Vascular: Major flow voids are preserved. Skull and upper cervical spine: Normal calvarium and skull base. Visualized upper cervical spine and soft tissues are normal. Sinuses/Orbits:No paranasal sinus fluid levels or advanced mucosal thickening. No mastoid or middle ear effusion. Normal orbits. MRA HEAD FINDINGS POSTERIOR CIRCULATION: --Vertebral arteries: Normal --Inferior cerebellar arteries: Normal. --Basilar artery: Normal. --Superior cerebellar  arteries: Normal. --Posterior cerebral arteries: Normal. ANTERIOR CIRCULATION: --Intracranial internal carotid arteries: Normal. --Anterior cerebral arteries (ACA): Normal. --Middle cerebral arteries (MCA): Normal. ANATOMIC VARIANTS: None IMPRESSION: 1. 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 No hemorrhage or mass effect. 2. Punctate foci of acute ischemia in the right temporal lobe and right thalamus. 3. Normal intracranial MRA. Electronically Signed   By: Deatra RobinsonKevin  Herman M.D.   On: 01/25/2021 23:32   IR CT Head Ltd  Result Date: 01/29/2021 INDICATION: Left sided hemiplegia, right gaze deviation and neglect on left-side. Occluded right internal carotid artery proximally, and right middle cerebral artery and the right anterior cerebral artery. EXAM: 1. EMERGENT LARGE VESSEL OCCLUSION THROMBOLYSIS (anterior CIRCULATION) COMPARISON:  CT angiogram of the head and neck of January 24, 2021. MEDICATIONS: Ancef 2 g IV antibiotic was administered within 1 hour of the procedure. ANESTHESIA/SEDATION: General anesthesia. CONTRAST:  Isovue 300 approximately 160 mL. FLUOROSCOPY TIME:  Fluoroscopy Time: 96 minutes 18 seconds (3550 mGy). COMPLICATIONS: None immediate. TECHNIQUE: Following a full explanation of the procedure along with the potential associated complications, an informed witnessed consent was obtained. The risks of intracranial hemorrhage of 10%, worsening neurological deficit, ventilator dependency, death and inability to revascularize were all reviewed in detail with the patient's spouse. The patient was then put under general anesthesia [facility]. The right groin was prepped and draped in the usual sterile fashion. Thereafter using modified Seldinger technique, transfemoral access into the right common femoral artery was obtained without difficulty. Over a 0.035 inch guidewire an 8 [place] Pinnacle 25  cm sheath was inserted. Through this, and  also over a 0.035 inch guidewire a 5 [place] JB 1 catheter was advanced to the aortic arch region and selectively positioned in the right common carotid artery. FINDINGS: The right common carotid arteriogram demonstrates the right external carotid artery and its major branches to be widely patent. 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 More distally no reconstitution of the right internal carotid artery is seen to the cranial skull base from the external carotid artery branches. PROCEDURE: Over a 0.035 inch 300 cm Rosen exchange guidewire an 087 balloon guide catheter which had been prepped with 50% contrast and 50% heparinized saline infusion was advanced and positioned just proximal to the right common carotid bifurcation. The guidewire was removed. Good aspiration obtained from the hub of the balloon guide catheter. A gentle control arteriogram performed through this continued to demonstrate the stump of the occluded right internal carotid artery. Over a 0.014 inch standard Synchro micro guidewire with a mild J configuration an 021 160 cm Trevo ProVue microcatheter was advanced without difficulty to the horizontal petrous right ICA. The guidewire was removed. Good aspiration obtained from the hub of the microcatheter. A gentle control arteriogram performed through the microcatheter demonstrated opacification of the right internal carotid artery distally with stasis. The microcatheter was then exchanged for an 014 inch 300 cm Transend EX micro guidewire with a mild J configuration. A 4 mm x 30 mm Viatrac angioplasty balloon catheter which had been prepped and purged with heparinized saline infusion was advanced using the rapid exchange technique and positioned with its markers optimally in the proximal right internal carotid artery. A control inflation was then performed via a micro inflation syringe device via micro  tubing. 4 mm diameter level for approximately 60 seconds. Thereafter, balloon was deflated and retrieved and removed. A control arteriogram was performed through the balloon catheter in the right internal carotid artery proximally now demonstrated modestly increased flow more distally to the horizontal petrous segment. The combination of an 021 microcatheter, and the 136 cm 55 Zoom aspiration catheter was advanced to the distal end of the exchange micro guidewire. The micro guidewire was removed. Good aspiration obtained from the hub of the Zoom aspiration catheter. A gentle control arteriogram performed through the Zoom aspiration catheter now demonstrated opacification of the right internal carotid artery in the distal cervical petrous and caval cavernous segment. Complete occlusion with multiple filling defects was demonstrable in the origin of the ophthalmic artery. An 014 inch standard Synchro micro guidewire was reintroduced into the microcatheter, and advanced without difficulty through the occluded distal cavernous supraclinoid right ICA into the right middle cerebral artery inferior division in the M2 M3 region followed by the microcatheter. The guidewire was removed. Good aspiration was obtained from the hub of the microcatheter. Gentle control arteriogram performed through the microcatheter demonstrated safe position of the tip of the microcatheter which was then connected to continuous heparinized saline infusion. A 6.5 mm x 47 mm retrieval device was then advanced to the distal end of the microcatheter. The retrieval was then deployed. The Zoom aspiration catheter was then advanced into the occluded M1 segment. With proximal flow arrest in the right common carotid artery, and constant aspiration being applied the hub of the Zoom aspiration catheter, and the 55 aspiration catheter for approximately 2-1/2 minutes, the combination of the retrieval device, the microcatheter, and the Zoom aspiration catheter  were retrieved and removed. Copious amount of clot were seen entangled in the retrieval device and  in the canister. Arteriogram performed following reversal of flow arrest, demonstrated revascularization of the inferior division of the right middle cerebral artery with persistent occlusion of the superior division achieving a TICI 2b revascularization. With the balloon guide in the proximal right internal carotid artery, the combination of the 55 Zoom aspiration catheter, the 021 microcatheter over a 0.014 inch standard Synchro micro guidewire was advanced to the supraclinoid right ICA. Using a torque device, access was obtained into the superior division M2 M3 region followed by the advancement of the microcatheter. The micro guidewire was removed. Good aspiration obtained from the hub of the microcatheter. A gentle control arteriogram performed through the microcatheter demonstrated safe position of the tip of the microcatheter. The Zoom aspiration catheter was advanced to just at the origin of the superior division. A 6.5 mm x 47 mm Embotrap retrieval device was then deployed in the usual manner as described above. With proximal flow arrest in the right internal carotid artery, and constant aspiration applied at the hub of the balloon guide catheter and at the hub of the 55 aspiration catheter for approximately 2 minutes, the combination of the retrieval device, the microcatheter, and the 55 Zoom aspiration catheter were retrieved and removed. Again clot was seen entangled in the retrieval device. A control arteriogram performed through the balloon guide following flow reversal in the right internal carotid artery now demonstrated complete angiographic revascularization of the right internal carotid artery distally, and also the petrous, cavernous and supraclinoid segments. The right middle cerebral artery distribution demonstrated a TICI 3 revascularization. The right anterior cerebral artery remained widely  patent. With the balloon guide catheter in the mid right internal carotid artery, an exchange micro guidewire was reintroduced with the distal end in the petrous horizontal segment. The balloon guide was then retrieved into the right common carotid artery. A diagnostic arteriogram performed through the balloon guide now demonstrated patency of the right internal carotid proximally associated with caliber irregularity, and a flat smooth filling defect in its mid cervical segment. It was decided to proceed with placement of a 6/8 mm x 4 mm Xact stent. The stent delivery apparatus was prepped and purged with heparinized saline infusion. Thereafter, using the rapid exchange technique, the stent delivery system was advanced and positioned such that the distal marker was just distal to the smooth flat filling defect and proximal was just inside the right common carotid artery. The stent was then deployed without any difficulty. The delivery system was then retrieved and removed. A control arteriogram performed through the balloon guide in the right common carotid artery demonstrated excellent apposition with smooth contour of the internal carotid artery in the stented segment. A control arteriogram performed more distally demonstrated significantly improved caliber and flow through the internal carotid artery proximally and distally maintaining a TICI 3 revascularization of the right MCA distribution. Patient was given 81 mg of aspirin, and 180 mg of Brilinta via an orogastric tube prior to the angioplasty, and also was given 6 mg of Integrilin intra-arterially. Control arteriograms were then performed at 15 and 30 minutes post deployment of the stent. These continued to demonstrate excellent flow through the stented segment, and also intracranially. Balloon guide was then retrieved and removed. Diagnostic catheter was then advanced to the left common carotid artery over a 0.035 inch Roadrunner guidewire. Arteriogram  performed at this site demonstrated left internal carotid artery at the bulb to the cranial skull base to be widely patent. Mild to moderate fusiform dilatation was noted of  the proximal cavernous segment. More distally the supraclinoid segments demonstrate wide patency. The left middle cerebral artery and the left anterior cerebral artery opacify into the capillary and venous phases. Transient cross-filling via the anterior communicating artery of the right anterior cerebral A2 segment was noticeable. Balloon guide was then retrieved and removed. An 8 French Angio-Seal closure device was then applied for hemostasis at the right femoral puncture site. Distal pulses remained present in both feet unchanged. Post CT of the brain demonstrated blush in the right basal ganglia without mass effect or shift. The patient was left intubated awaiting the results of the COVID-19 test. 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 PLAN: Follow-up in the clinic 4 weeks post discharge. Electronically Signed   By: Julieanne Cotton M.D.   On: 01/26/2021 16:06   IR CT Head Ltd  Result Date: 01/29/2021 INDICATION: Left sided hemiplegia, right gaze deviation and neglect on left-side. Occluded right internal carotid artery proximally, and right middle cerebral artery and the right anterior cerebral artery. EXAM: 1. EMERGENT LARGE VESSEL OCCLUSION THROMBOLYSIS (anterior CIRCULATION) COMPARISON:  CT angiogram of the head and neck of January 24, 2021. MEDICATIONS: Ancef 2 g IV antibiotic was administered within 1 hour of the procedure.  ANESTHESIA/SEDATION: General anesthesia. CONTRAST:  Isovue 300 approximately 160 mL. FLUOROSCOPY TIME:  Fluoroscopy Time: 96 minutes 18 seconds (3550 mGy). COMPLICATIONS: None immediate. TECHNIQUE: Following a full explanation of the procedure along with the potential associated complications, an informed witnessed consent was obtained. The risks of intracranial hemorrhage of 10%, worsening neurological deficit, ventilator dependency, death and inability to revascularize were all reviewed in detail with the patient's spouse. The patient was then put under general anesthesia by the Department of Anesthesiology at [facility]. The right groin was prepped and draped in the usual sterile fashion. Thereafter using modified Seldinger technique, transfemoral access into the right common femoral artery was obtained without difficulty. Over a 0.035 inch guidewire an 8 [place] Pinnacle 25 cm sheath was inserted. Through this, and also over a 0.035 inch guidewire a 5 [place] JB 1 catheter was advanced to the aortic arch region and selectively positioned in the right common carotid artery. FINDINGS: The right common carotid arteriogram demonstrates the right external carotid artery and its major branches to be widely patent. 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 More distally no reconstitution of the right internal carotid artery is seen to the cranial skull base from the external carotid artery branches. PROCEDURE: Over a 0.035 inch 300 cm Rosen exchange guidewire an 087 balloon guide catheter which had been prepped with 50% contrast and 50% heparinized saline infusion was advanced and positioned just proximal to the right common carotid bifurcation. The guidewire was removed. Good aspiration obtained from the hub of the balloon guide catheter. A gentle control arteriogram performed through this continued to demonstrate the stump of the occluded right internal carotid  artery. Over a 0.014 inch standard Synchro micro guidewire with a mild J configuration an 021 160 cm Trevo ProVue microcatheter was advanced without difficulty to the horizontal petrous right ICA. The guidewire was removed. Good aspiration obtained from the hub  of the microcatheter. A gentle control arteriogram performed through the microcatheter demonstrated opacification of the right internal carotid artery distally with stasis. The microcatheter was then exchanged for an 014 inch 300 cm Transend EX micro guidewire with a mild J configuration. A 4 mm x 30 mm Viatrac angioplasty balloon catheter which had been prepped and purged with heparinized saline infusion was advanced using the rapid exchange technique and positioned with its markers optimally in the proximal right internal carotid artery. A control inflation was then performed via a micro inflation syringe device via micro tubing. 4 mm diameter level for approximately 60 seconds. Thereafter, balloon was deflated and retrieved and removed. A control arteriogram was performed through the balloon catheter in the right internal carotid artery proximally now demonstrated modestly increased flow more distally to the horizontal petrous segment. The combination of an 021 microcatheter, and the 136 cm 55 Zoom aspiration catheter was advanced to the distal end of the exchange micro guidewire. The micro guidewire was removed. Good aspiration obtained from the hub of the Zoom aspiration catheter. A gentle control arteriogram performed through the Zoom aspiration catheter now demonstrated opacification of the right internal carotid artery in the distal cervical petrous and caval cavernous segment. Complete occlusion with multiple filling defects was demonstrable in the origin of the ophthalmic artery. An 014 inch standard Synchro micro guidewire was reintroduced into the microcatheter, and advanced without difficulty through the occluded distal cavernous supraclinoid  right ICA into the right middle cerebral artery inferior division in the M2 M3 region followed by the microcatheter. The guidewire was removed. Good aspiration was obtained from the hub of the microcatheter. Gentle control arteriogram performed through the microcatheter demonstrated safe position of the tip of the microcatheter which was then connected to continuous heparinized saline infusion. A 6.5 mm x 47 mm retrieval device was then advanced to the distal end of the microcatheter. The retrieval was then deployed. The Zoom aspiration catheter was then advanced into the occluded M1 segment. With proximal flow arrest in the right common carotid artery, and constant aspiration being applied the hub of the Zoom aspiration catheter, and the 55 aspiration catheter for approximately 2-1/2 minutes, the combination of the retrieval device, the microcatheter, and the Zoom aspiration catheter were retrieved and removed. Copious amount of clot were seen entangled in the retrieval device and in the canister. Arteriogram performed following reversal of flow arrest, demonstrated revascularization of the inferior division of the right middle cerebral artery with persistent occlusion of the superior division achieving a TICI 2b revascularization. With the balloon guide in the proximal right internal carotid artery, the combination of the 55 Zoom aspiration catheter, the 021 microcatheter over a 0.014 inch standard Synchro micro guidewire was advanced to the supraclinoid right ICA. Using a torque device, access was obtained into the superior division M2 M3 region followed by the advancement of the microcatheter. The micro guidewire was removed. Good aspiration obtained from the hub of the microcatheter. A gentle control arteriogram performed through the microcatheter demonstrated safe position of the tip of the microcatheter. The Zoom aspiration catheter was advanced to just at the origin of the superior division. A 6.5 mm x 47 mm  Embotrap retrieval device was then deployed in the usual manner as described above. With proximal flow arrest in the right internal carotid artery, and constant aspiration applied at the hub of the balloon guide catheter and at the hub of the 55 aspiration catheter for approximately 2 minutes, the combination of the retrieval device,  the microcatheter, and the 55 Zoom aspiration catheter were retrieved and removed. Again clot was seen entangled in the retrieval device. A control arteriogram performed through the balloon guide following flow reversal in the right internal carotid artery now demonstrated complete angiographic revascularization of the right internal carotid artery distally, and also the petrous, cavernous and supraclinoid segments. The right middle cerebral artery distribution demonstrated a TICI 3 revascularization. The right anterior cerebral artery remained widely patent. With the balloon guide catheter in the mid right internal carotid artery, an exchange micro guidewire was reintroduced with the distal end in the petrous horizontal segment. The balloon guide was then retrieved into the right common carotid artery. A diagnostic arteriogram performed through the balloon guide now demonstrated patency of the right internal carotid proximally associated with caliber irregularity, and a flat smooth filling defect in its mid cervical segment. It was decided to proceed with placement of a 6/8 mm x 4 mm Xact stent. The stent delivery apparatus was prepped and purged with heparinized saline infusion. Thereafter, using the rapid exchange technique, the stent delivery system was advanced and positioned such that the distal marker was just distal to the smooth flat filling defect and proximal was just inside the right common carotid artery. The stent was then deployed without any difficulty. The delivery system was then retrieved and removed. A control arteriogram performed through the balloon guide in the  right common carotid artery demonstrated excellent apposition with smooth contour of the internal carotid artery in the stented segment. A control arteriogram performed more distally demonstrated significantly improved caliber and flow through the internal carotid artery proximally and distally maintaining a TICI 3 revascularization of the right MCA distribution. Patient was given 81 mg of aspirin, and 180 mg of Brilinta via an orogastric tube prior to the angioplasty, and also was given 6 mg of Integrilin intra-arterially. Control arteriograms were then performed at 15 and 30 minutes post deployment of the stent. These continued to demonstrate excellent flow through the stented segment, and also intracranially. Balloon guide was then retrieved and removed. Diagnostic catheter was then advanced to the left common carotid artery over a 0.035 inch Roadrunner guidewire. Arteriogram performed at this site demonstrated left internal carotid artery at the bulb to the cranial skull base to be widely patent. Mild to moderate fusiform dilatation was noted of the proximal cavernous segment. More distally the supraclinoid segments demonstrate wide patency. The left middle cerebral artery and the left anterior cerebral artery opacify into the capillary and venous phases. Transient cross-filling via the anterior communicating artery of the right anterior cerebral A2 segment was noticeable. Balloon guide was then retrieved and removed. An 8 French Angio-Seal closure device was then applied for hemostasis at the right femoral puncture site. Distal pulses remained present in both feet unchanged. Post CT of the brain demonstrated blush in the right basal ganglia without mass effect or shift. The patient was left intubated awaiting the results of the COVID-19 test. 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 PLAN: Follow-up in the clinic 4 weeks post discharge. Electronically Signed   By: Julieanne Cotton M.D.   On: 01/26/2021 16:06   CT CEREBRAL PERFUSION  W CONTRAST  Result Date: 01/24/2021 CLINICAL DATA:  Left-sided deficits EXAM: CT ANGIOGRAPHY HEAD AND NECK CT PERFUSION BRAIN TECHNIQUE: Multidetector CT imaging of the head and neck was performed using the standard protocol during bolus administration of intravenous contrast. Multiplanar CT image reconstructions and MIPs were obtained to evaluate the vascular anatomy. Carotid stenosis measurements (when applicable) are obtained utilizing NASCET criteria, using the distal internal carotid diameter as the denominator. Multiphase CT imaging of the brain was performed following IV bolus contrast injection. Subsequent parametric perfusion maps were calculated using RAPID software. CONTRAST:  OMNIPAQUE IOHEXOL 350 MG/ML SOLN COMPARISON:  None. FINDINGS: CTA NECK FINDINGS SKELETON: There is no bony spinal canal stenosis. No lytic or blastic lesion. OTHER NECK: Normal pharynx, larynx and major salivary glands. No cervical lymphadenopathy. Unremarkable thyroid gland. UPPER CHEST: No pneumothorax or pleural effusion. No nodules or masses. AORTIC ARCH: There is calcific atherosclerosis of the aortic arch. There is no aneurysm, dissection or hemodynamically significant stenosis of the visualized portion of the aorta. Conventional 3 vessel aortic branching pattern. The visualized proximal subclavian arteries are widely patent. 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 The right common and external carotid arteries are normal. LEFT CAROTID SYSTEM: No dissection, occlusion  or aneurysm. Mild atherosclerotic calcification at the carotid bifurcation without hemodynamically significant stenosis. VERTEBRAL ARTERIES: Left dominant configuration. Both origins are clearly patent. There is no dissection, occlusion or flow-limiting stenosis to the skull base (V1-V3 segments). CTA HEAD FINDINGS POSTERIOR CIRCULATION: --Vertebral arteries: Normal V4 segments. --Inferior cerebellar arteries: Normal. --Basilar artery: Normal. --Superior cerebellar arteries: Normal. --Posterior cerebral arteries (PCA): Normal. ANTERIOR CIRCULATION: --Intracranial internal carotid arteries: Normal. --Anterior cerebral arteries (ACA): Normal. Both A1 segments are present. Patent anterior communicating artery (a-comm). --Middle cerebral arteries (MCA): Complete occlusion of the right MCA at its origin. There is no collateral flow demonstrated. VENOUS SINUSES: As permitted by contrast timing, patent. ANATOMIC VARIANTS: None Review of the MIP images confirms the above findings. CT Brain Perfusion Findings: ASPECTS: 7 CBF (<30%) Volume: 68mL Perfusion (Tmax>6.0s) volume: 2 L60mL Mismatch Volume: Infarction Location:Right MCA territory IMPRESSION: 1. 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 2. 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 3. 68 mL right MCA territory core infarct with 133 mL area of ischemic penumbra. Critical Value/emergent results were called by telephone at the time of interpretation on 01/24/2021 at 11:24 pm to provider ERIC [facility] , who verbally acknowledged these results. Aortic Atherosclerosis (ICD10-I70.0). Electronically Signed   By: Deatra Robinson M.D.   On: 01/24/2021 23:34   DG CHEST PORT 1 VIEW  Result Date: 02/03/2021 CLINICAL DATA:  Tachypnea EXAM: PORTABLE CHEST 1 VIEW COMPARISON:  Radiograph 02/01/2021 FINDINGS: Vaginal a transesophageal feeding tube tip terminates near the  gastric antrum/duodenal bulb. Telemetry leads overlie the chest. Nasal cannula overlies the upper chest. Continued increase in the patchy, heterogeneous bilateral pulmonary opacities most coalescent in the right upper lobe though increasingly dense throughout the left hemithorax as well. Stable cardiomediastinal contours accounting for differences in technique. No pneumothorax or visible layering effusion. No acute osseous or soft tissue abnormality. Degenerative changes are present in the imaged spine and shoulders. IMPRESSION: Increasing bilateral opacities, particularly in the right upper lobe and throughout the left hemithorax. Could reflect worsening multifocal infection and/or edema. Stable cardiomegaly. Feeding tube tip near the gastric antrum/duodenal bulb. Electronically Signed   By: Coralie Keens.D.  On: 02/03/2021 06:00   DG CHEST PORT 1 VIEW  Result Date: 02/01/2021 CLINICAL DATA:  Fever. EXAM: PORTABLE CHEST 1 VIEW COMPARISON:  Chest x-ray 01/29/2021, 01/25/2021. FINDINGS: Feeding tube noted with tip below left hemidiaphragm. Cardiomegaly. Progressive bilateral pulmonary infiltrates/edema, particularly in the right upper lung. Multifocal pneumonia and or pulmonary edema could present in this fashion. No pleural effusion or pneumothorax. Degenerative change thoracic spine. IMPRESSION: Feeding tube noted with tip below left hemidiaphragm. 2.  Cardiomegaly. 3. Progressive bilateral pulmonary infiltrates/edema, particularly in the right upper lung. Multifocal pneumonia and or pulmonary edema could present this fashion. Electronically Signed   By: Maisie Fus  Register   On: 02/01/2021 15:19   DG Chest Port 1 View  Result Date: 01/29/2021 CLINICAL DATA:  Encounter for compromised airway. EXAM: PORTABLE CHEST 1 VIEW COMPARISON:  01/27/2021. FINDINGS: Feeding tube is followed into the stomach with the tip projecting beyond the inferior margin of the image. Trachea is midline. Heart is enlarged, stable.  Lungs are low in volume with increasing mixed interstitial and airspace opacification, worst in the right upper lobe. No definite pleural fluid. IMPRESSION: Low lung volumes with increasing mixed interstitial and airspace opacification, findings which Bargo be due to edema, pneumonia and/or aspiration. Electronically Signed   By: Leanna Battles M.D.   On: 01/29/2021 08:37   DG Chest Port 1 View  Result Date: 01/27/2021 CLINICAL DATA:  Shortness of breath EXAM: PORTABLE CHEST 1 VIEW COMPARISON:  January 25, 2021 FINDINGS: The ET tube is been removed. A feeding tube is been placed, terminating below today's film. The NG tube is been removed. No pneumothorax. The cardiomediastinal silhouette is stable. The left lung is clear. No acute abnormalities seen on the right. IMPRESSION: 1. Support apparatus as above. 2. No acute abnormalities. Electronically Signed   By: Gerome Sam III M.D   On: 01/27/2021 14:21   DG CHEST PORT 1 VIEW  Result Date: 01/25/2021 CLINICAL DATA:  Intubation. EXAM: PORTABLE CHEST 1 VIEW COMPARISON:  No prior. FINDINGS: Endotracheal tube and NG tube in good anatomic position. Cardiomegaly. No pulmonary venous congestion. Low lung volumes with bibasilar atelectasis. No pleural effusion or pneumothorax. IMPRESSION: 1.  Endotracheal tube and NG tube in good anatomic position. 2.  Cardiomegaly.  No pulmonary venous congestion. 3.  Low lung volumes with bibasilar atelectasis. Electronically Signed   By: Maisie Fus  Register   On: 01/25/2021 05:11   DG Swallowing Func-Speech Pathology  Result Date: 01/30/2021 Objective Swallowing Evaluation: Type of Study: Bedside Swallow Evaluation  Patient Details Name: Leonard [name] MRN: [id number] Date of Birth: [birth year] Today's Date: 01/30/2021 Time: SLP Start Time (ACUTE ONLY): 1205 -SLP Stop Time (ACUTE ONLY): 1215 SLP Time Calculation (min) (ACUTE ONLY): 10 min Past Medical History: No past medical history on file. Past Surgical History: Past Surgical  History: Procedure Laterality Date . IR ANGIO INTRA EXTRACRAN SEL COM CAROTID INNOMINATE UNI L MOD SED  01/25/2021 . IR CT HEAD LTD  01/25/2021 . IR CT HEAD LTD  01/25/2021 . IR PERCUTANEOUS ART THROMBECTOMY/INFUSION INTRACRANIAL INC DIAG ANGIO  01/25/2021 . RADIOLOGY WITH ANESTHESIA N/A 01/24/2021  Procedure: IR WITH ANESTHESIA;  Surgeon: Julieanne Cotton, MD;  Location: [facility] OR;  Service: Radiology;  Laterality: N/A; HPI: Pt is a 65 y.o. male with no significant PMHx who presented via EMS as a code stroke for acute onset of left hemiplegia, left facial droop, dysarthria and left hemineglect. NIHSS was 18 on admission and tPA was given. 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 Pt s/p  s/p bilateral common carotid arteriograms followed by right MCA/ACA/ICA thrombus retrieval with stent angioplasty 4/21. Pt was intubated for procedure. CT head 4/22: Large area of hypoattenuation at the site of known right MCA territory infarct. Small focus of parenchymal hyperdensity at the posterior aspect of the infarct site Degrazia indicate a small amount of petechial hemorrhage or contrast extravasation from procedure. Small amount of subarachnoid hyperdensity over the right frontal convexity, either subarachnoid blood or contrast taining.  Subjective: alert with cueing, pleasant Assessment / Plan / Recommendation CHL IP CLINICAL IMPRESSIONS 01/30/2021 Clinical Impression Pt presents with a moderate oral and moderate to severe pharyngeal dysphagia with sensory and motor deficits of neurogenic etiology. Oral deficit impairments included reduced left facial, labial, and lingual strength and ROM with decreased sensation. This allowed for reduced left sided anterior spillage, reduced bolus cohesion, and oral residuals. Pharyngeal deficits c/b reduced timing and efficiency of laryngeal vestibule closure, incomplete epiglottic deflection, reduced pharyngeal  constriction, decreased base of tongue retraction, diminished sensation, and decreased UES distention. This resulted in pre, during, and post swallow penetration of all consistencies trialed to the level of the true vocal cords (puree, nectar thick, and honey thick liquids). Pt with incosistent sensation of reduced airway protection events. Cues for cough and or throat clear aided in expelling penetrates. Moderate valleuclar and pyriform sinsus residuals noted with thicker POs. Pt very high aspiration risk for POs across a meal given deficits noted this date. Recommend continue short term alternative nutrition and NPO with ice chips following diligent oral care and intensive swallowing therapy. SLP to follow up. SLP Visit Diagnosis Dysphagia, oropharyngeal phase (R13.12) Attention and concentration deficit following -- Frontal lobe and executive function deficit following -- Impact on safety and function Moderate aspiration risk;Severe aspiration risk   CHL IP TREATMENT RECOMMENDATION 01/30/2021 Treatment Recommendations Therapy as outlined in treatment plan below   Prognosis 01/30/2021 Prognosis for Safe Diet Advancement Good Barriers to Reach Goals Severity of deficits;Time post onset Barriers/Prognosis Comment -- CHL IP DIET RECOMMENDATION 01/30/2021 SLP Diet Recommendations NPO Liquid Administration via -- Medication Administration Via alternative means Compensations -- Postural Changes --   CHL IP OTHER RECOMMENDATIONS 01/30/2021 Recommended Consults -- Oral Care Recommendations Oral care QID Other Recommendations --   CHL IP FOLLOW UP RECOMMENDATIONS 01/30/2021 Follow up Recommendations Inpatient Rehab   CHL IP FREQUENCY AND DURATION 01/30/2021 Speech Therapy Frequency (ACUTE ONLY) min 3x week Treatment Duration 2 weeks      CHL IP ORAL PHASE 01/30/2021 Oral Phase Impaired Oral - Pudding Teaspoon -- Oral - Pudding Cup -- Oral - Honey Teaspoon -- Oral - Honey Cup Weak lingual manipulation;Reduced posterior  propulsion;Left pocketing in lateral sulci;Lingual/palatal residue;Decreased bolus cohesion;Delayed oral transit Oral - Nectar Teaspoon -- Oral - Nectar Cup Weak lingual manipulation;Reduced posterior propulsion;Lingual/palatal residue;Delayed oral transit;Decreased bolus cohesion Oral - Nectar Straw -- Oral - Thin Teaspoon -- Oral - Thin Cup -- Oral - Thin Straw -- Oral - Puree Weak lingual manipulation;Reduced posterior propulsion;Delayed oral transit;Decreased bolus cohesion;Lingual/palatal residue Oral - Mech Soft -- Oral - Regular -- Oral - Multi-Consistency -- Oral - Pill -- Oral Phase - Comment --  CHL IP PHARYNGEAL PHASE 01/30/2021 Pharyngeal Phase Impaired Pharyngeal- Pudding Teaspoon -- Pharyngeal -- Pharyngeal- Pudding Cup -- Pharyngeal -- Pharyngeal- Honey Teaspoon -- Pharyngeal -- Pharyngeal- Honey Cup Reduced epiglottic inversion;Reduced pharyngeal peristalsis;Reduced anterior laryngeal mobility;Reduced laryngeal elevation;Reduced airway/laryngeal closure;Reduced tongue base retraction;Penetration/Aspiration before swallow;Penetration/Aspiration during swallow;Pharyngeal residue -  valleculae;Pharyngeal residue - pyriform Pharyngeal Material enters airway, remains ABOVE vocal cords and not ejected out;Material enters airway, CONTACTS cords and not ejected out Pharyngeal- Nectar Teaspoon -- Pharyngeal -- Pharyngeal- Nectar Cup Reduced epiglottic inversion;Reduced anterior laryngeal mobility;Reduced laryngeal elevation;Reduced airway/laryngeal closure;Reduced tongue base retraction;Penetration/Aspiration before swallow;Penetration/Aspiration during swallow;Pharyngeal residue - valleculae;Pharyngeal residue - pyriform;Reduced pharyngeal peristalsis Pharyngeal Material enters airway, remains ABOVE vocal cords and not ejected out;Material enters airway, CONTACTS cords and not ejected out Pharyngeal- Nectar Straw -- Pharyngeal -- Pharyngeal- Thin Teaspoon -- Pharyngeal -- Pharyngeal- Thin Cup -- Pharyngeal --  Pharyngeal- Thin Straw -- Pharyngeal -- Pharyngeal- Puree Delayed swallow initiation-vallecula;Reduced pharyngeal peristalsis;Reduced epiglottic inversion;Reduced anterior laryngeal mobility;Reduced laryngeal elevation;Reduced airway/laryngeal closure;Reduced tongue base retraction;Penetration/Aspiration before swallow;Penetration/Aspiration during swallow;Pharyngeal residue - pyriform;Pharyngeal residue - valleculae Pharyngeal Material enters airway, remains ABOVE vocal cords and not ejected out;Material enters airway, CONTACTS cords and not ejected out Pharyngeal- Mechanical Soft -- Pharyngeal -- Pharyngeal- Regular -- Pharyngeal -- Pharyngeal- Multi-consistency -- Pharyngeal -- Pharyngeal- Pill -- Pharyngeal -- Pharyngeal Comment --  CHL IP CERVICAL ESOPHAGEAL PHASE 01/30/2021 Cervical Esophageal Phase Impaired Pudding Teaspoon -- Pudding Cup -- Honey Teaspoon -- Honey Cup Reduced cricopharyngeal relaxation Nectar Teaspoon -- Nectar Cup Reduced cricopharyngeal relaxation Nectar Straw -- Thin Teaspoon -- Thin Cup -- Thin Straw -- Puree Reduced cricopharyngeal relaxation Mechanical Soft -- Regular -- Multi-consistency -- Pill -- Cervical Esophageal Comment -- Chelsea E Hartness MA, CCC-SLP 01/30/2021, 1:55 PM              EEG adult  Result Date: 02/07/2021 Charlsie Quest, MD     02/07/2021  5:59 PM Patient Name: Wheeler Incorvaia Economou MRN: [id number] Epilepsy Attending: Charlsie Quest Referring Physician/Provider: Dr Huey Bienenstock Date: 02/07/2021 Duration: 23.14 mins Patient history: 65yo M with right MCA infarct and ams. EEG to evaluate for seizure Level of alertness: Awake AEDs during EEG study: None Technical aspects: This EEG study was done with scalp electrodes positioned according to the 10-20 International system of electrode placement. Electrical activity was acquired at a sampling rate of 500Hz  and reviewed with a high frequency filter of 70Hz  and a low frequency filter of 1Hz . EEG data were recorded  continuously and digitally stored. Description: The posterior dominant rhythm consists of 8 Hz activity of moderate voltage (25-35 uV) seen predominantly in posterior head regions, asymmetric( R<L) and reactive to eye opening and eye closing. EEG showed continuous generalized and lateralized right hemisphere 3 to 6 Hz theta-delta slowing.  Hyperventilation and photic stimulation were not performed.   ABNORMALITY - Continuous slow, generalized and lateralized right hemisphere IMPRESSION: This study is suggestive of cortical dysfunction arising from right hemisphere likely secondary to underlying structural abnormality/stroke. Additionally, there is mild diffuse encephalopathy, nonspecific etiology. No seizures or epileptiform discharges were seen throughout the recording. Charlsie Quest   EEG adult  Result Date: 01/31/2021 Charlsie Quest, MD     01/31/2021  6:52 PM Patient Name: Berle Fitz Beattie MRN: [id number] Epilepsy Attending: Charlsie Quest Referring Physician/Provider: Dr Delia Heady Date: 01/31/2021 Duration: 23.32 mins Patient history: 65yo M with R MCA infarct, with ams. EEG to evaluate for seizure Level of alertness: Awake AEDs during EEG study: None Technical aspects: This EEG study was done with scalp electrodes positioned according to the 10-20 International system of electrode placement. Electrical activity was acquired at a sampling rate of 500Hz  and reviewed with a high frequency filter of 70Hz  and a low frequency filter of 1Hz . EEG data were recorded continuously and digitally  stored. Description: No clear posterior dominant rhythm was seen. EEG showed continuous generalized polymorphic mixed frequencies with predominantly to 6-9hz  Hz theta activity admixed with 2-3hz  delta slowing. Additionally there is continuous 3-5hz  theta-delta slowing in right temporal region. Hyperventilation and photic stimulation were not performed.   ABNORMALITY - Continuous slow, generalized and maximal right temporal  region IMPRESSION: This study is suggestive of cortical dysfunction arising from right temporal region, likely secondary to underlying structural abnormality as well as mild to moderate diffuse encephalopathy, nonspecific etiology. No seizures or epileptiform discharges were seen throughout the recording. Charlsie Quest   ECHOCARDIOGRAM COMPLETE  Result Date: 01/25/2021    ECHOCARDIOGRAM REPORT   Patient Name:   KATHRYN LINAREZ Delcarlo Date of Exam: 01/25/2021 Medical Rec #:  [id number]    Height:       69.0 in Accession #:    [id number]   Weight:       182.1 lb Date of Birth:  March 09, 1956   BSA:          1.985 m Patient Age:    64 years     BP:           117/59 mmHg Patient Gender: M            HR:           75 bpm. Exam Location:  Inpatient Procedure: 2D Echo, Color Doppler and Cardiac Doppler Indications:    Stroke I63.9  History:        Patient has no prior history of Echocardiogram examinations.  Sonographer:    Elmarie Shiley Dance Referring Phys: [id number] ERIC LINDZEN IMPRESSIONS  1. Left ventricular ejection fraction, by estimation, is 60 to 65%. The left ventricle has normal function. The left ventricle has no regional wall motion abnormalities. There is mild left ventricular hypertrophy. Left ventricular diastolic parameters were normal.  2. Right ventricular systolic function is normal. The right ventricular size is normal.  3. Left atrial size was moderately dilated.  4. The mitral valve is normal in structure. No evidence of mitral valve regurgitation. No evidence of mitral stenosis.  5. The aortic valve is tricuspid. Aortic valve regurgitation is not visualized. No aortic stenosis is present.  6. The inferior vena cava is normal in size with greater than 50% respiratory variability, suggesting right atrial pressure of 3 mmHg. FINDINGS  Left Ventricle: Left ventricular ejection fraction, by estimation, is 60 to 65%. The left ventricle has normal function. The left ventricle has no regional wall motion abnormalities. The  left ventricular internal cavity size was normal in size. There is  mild left ventricular hypertrophy. Left ventricular diastolic parameters were normal. Right Ventricle: The right ventricular size is normal. No increase in right ventricular wall thickness. Right ventricular systolic function is normal. Left Atrium: Left atrial size was moderately dilated. Right Atrium: Right atrial size was normal in size. Pericardium: There is no evidence of pericardial effusion. Mitral Valve: The mitral valve is normal in structure. No evidence of mitral valve regurgitation. No evidence of mitral valve stenosis. Tricuspid Valve: The tricuspid valve is normal in structure. Tricuspid valve regurgitation is not demonstrated. No evidence of tricuspid stenosis. Aortic Valve: The aortic valve is tricuspid. Aortic valve regurgitation is not visualized. No aortic stenosis is present. Pulmonic Valve: The pulmonic valve was normal in structure. Pulmonic valve regurgitation is not visualized. No evidence of pulmonic stenosis. Aorta: The aortic root is normal in size and structure. Venous: The inferior vena cava is normal in size with greater  than 50% respiratory variability, suggesting right atrial pressure of 3 mmHg. IAS/Shunts: No atrial level shunt detected by color flow Doppler.  LEFT VENTRICLE PLAX 2D LVIDd:         5.20 cm Diastology LVIDs:         3.10 cm LV e' medial:    5.22 cm/s LV PW:         1.40 cm LV E/e' medial:  13.2 LV IVS:        1.40 cm LV e' lateral:   8.16 cm/s                        LV E/e' lateral: 8.5  RIGHT VENTRICLE             IVC RV Basal diam:  1.90 cm     IVC diam: 2.20 cm RV S prime:     13.10 cm/s TAPSE (M-mode): 1.9 cm LEFT ATRIUM             Index       RIGHT ATRIUM           Index LA diam:        4.60 cm 2.32 cm/m  RA Area:     10.90 cm LA Vol (A2C):   44.1 ml 22.21 ml/m RA Volume:   18.40 ml  9.27 ml/m LA Vol (A4C):   45.2 ml 22.77 ml/m LA Biplane Vol: 46.6 ml 23.47 ml/m  AORTIC VALVE LVOT Vmax:    129.00 cm/s LVOT Vmean:  89.700 cm/s LVOT VTI:    0.311 m  AORTA Ao Asc diam: 3.40 cm MITRAL VALVE MV Area (PHT): 3.08 cm     SHUNTS MV Decel Time: 246 msec     Systemic VTI: 0.31 m MV E velocity: 69.00 cm/s MV A velocity: 105.00 cm/s MV E/A ratio:  0.66 Charlton Haws MD Electronically signed by Charlton Haws MD Signature Date/Time: 01/25/2021/1:07:57 PM    Final    IR PERCUTANEOUS ART THROMBECTOMY/INFUSION INTRACRANIAL INC DIAG ANGIO  Result Date: 01/29/2021 INDICATION: Left sided hemiplegia, right gaze deviation and neglect on left-side. Occluded right internal carotid artery proximally, and right middle cerebral artery and the right anterior cerebral artery. EXAM: 1. EMERGENT LARGE VESSEL OCCLUSION THROMBOLYSIS (anterior CIRCULATION) COMPARISON:  CT angiogram of the head and neck of January 24, 2021. MEDICATIONS: Ancef 2 g IV antibiotic was administered within 1 hour of the procedure. ANESTHESIA/SEDATION: General anesthesia. CONTRAST:  Isovue 300 approximately 160 mL. FLUOROSCOPY TIME:  Fluoroscopy Time: 96 minutes 18 seconds (3550 mGy). COMPLICATIONS: None immediate. TECHNIQUE: Following a full explanation of the procedure along with the potential associated complications, an informed witnessed consent was obtained. The risks of intracranial hemorrhage of 10%, worsening neurological deficit, ventilator dependency, death and inability to revascularize were all reviewed in detail with the patient's spouse. The patient was then put under general anesthesia by the Department of Anesthesiology at [facility]. The right groin was prepped and draped in the usual sterile fashion. Thereafter using modified Seldinger technique, transfemoral access into the right common femoral artery was obtained without difficulty. Over a 0.035 inch guidewire an 8 [place] Pinnacle 25 cm sheath was inserted. Through this, and also over a 0.035 inch guidewire a 5 [place] JB 1 catheter was advanced to the aortic arch region and  selectively positioned in the right common carotid artery. FINDINGS: The right common carotid arteriogram demonstrates the right external carotid artery and its major branches to be widely patent. 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 More distally no reconstitution of the right internal carotid artery is seen to the cranial skull base from the external carotid artery branches. PROCEDURE: Over a 0.035 inch 300 cm Rosen exchange guidewire an 087 balloon guide catheter which had been prepped with 50% contrast and 50% heparinized saline infusion was advanced and positioned just proximal to the right common carotid bifurcation. The guidewire was removed. Good aspiration obtained from the hub of the balloon guide catheter. A gentle control arteriogram performed through this continued to demonstrate the stump of the occluded right internal carotid artery. Over a 0.014 inch standard Synchro micro guidewire with a mild J configuration an 021 160 cm Trevo ProVue microcatheter was advanced without difficulty to the horizontal petrous right ICA. The guidewire was removed. Good aspiration obtained from the hub of the microcatheter. A gentle control arteriogram performed through the microcatheter demonstrated opacification of the right internal carotid artery distally with stasis. The microcatheter was then exchanged for an 014 inch 300 cm Transend EX micro guidewire with a mild J configuration. A 4 mm x 30 mm Viatrac angioplasty balloon catheter which had been prepped and purged with heparinized saline infusion was advanced using the rapid exchange technique and positioned with its markers optimally in the proximal right internal carotid artery. A control inflation was then performed via a micro inflation syringe device via micro tubing. 4 mm diameter level for approximately 60 seconds. Thereafter, balloon was deflated and retrieved and removed. A control arteriogram was  performed through the balloon catheter in the right internal carotid artery proximally now demonstrated modestly increased flow more distally to the horizontal petrous segment. The combination of an 021 microcatheter, and the 136 cm 55 Zoom aspiration catheter was advanced to the distal end of the exchange micro guidewire. The micro guidewire was removed. Good aspiration obtained from the hub of the Zoom aspiration catheter. A gentle control arteriogram performed through the Zoom aspiration catheter now demonstrated opacification of the right internal carotid artery in the distal cervical petrous and caval cavernous segment. Complete occlusion with multiple filling defects was demonstrable in the origin of the ophthalmic artery. An 014 inch standard Synchro micro guidewire was reintroduced into the microcatheter, and advanced without difficulty through the occluded distal cavernous supraclinoid right ICA into the right middle cerebral artery inferior division in the M2 M3 region followed by the microcatheter. The guidewire was removed. Good aspiration was obtained from the hub of the microcatheter. Gentle control arteriogram performed through the microcatheter demonstrated safe position of the tip of the microcatheter which was then connected to continuous heparinized saline infusion. A 6.5 mm x 47 mm retrieval device was then advanced to the distal end of the microcatheter. The retrieval was then deployed. The Zoom aspiration catheter was then advanced into the occluded M1 segment. With proximal flow arrest in the right common carotid artery, and constant aspiration being applied the hub of the Zoom aspiration catheter, and the 55 aspiration catheter for approximately 2-1/2 minutes, the combination of the retrieval device, the microcatheter, and the Zoom aspiration catheter were retrieved and removed. Copious amount of clot were seen entangled in the retrieval device and in the canister. Arteriogram performed  following reversal of flow arrest, demonstrated revascularization of the inferior division of the right middle cerebral artery with persistent occlusion of the superior division achieving a TICI 2b revascularization. With the balloon guide in the proximal right internal carotid artery, the combination of the  55 Zoom aspiration catheter, the 021 microcatheter over a 0.014 inch standard Synchro micro guidewire was advanced to the supraclinoid right ICA. Using a torque device, access was obtained into the superior division M2 M3 region followed by the advancement of the microcatheter. The micro guidewire was removed. Good aspiration obtained from the hub of the microcatheter. A gentle control arteriogram performed through the microcatheter demonstrated safe position of the tip of the microcatheter. The Zoom aspiration catheter was advanced to just at the origin of the superior division. A 6.5 mm x 47 mm Embotrap retrieval device was then deployed in the usual manner as described above. With proximal flow arrest in the right internal carotid artery, and constant aspiration applied at the hub of the balloon guide catheter and at the hub of the 55 aspiration catheter for approximately 2 minutes, the combination of the retrieval device, the microcatheter, and the 55 Zoom aspiration catheter were retrieved and removed. Again clot was seen entangled in the retrieval device. A control arteriogram performed through the balloon guide following flow reversal in the right internal carotid artery now demonstrated complete angiographic revascularization of the right internal carotid artery distally, and also the petrous, cavernous and supraclinoid segments. The right middle cerebral artery distribution demonstrated a TICI 3 revascularization. The right anterior cerebral artery remained widely patent. With the balloon guide catheter in the mid right internal carotid artery, an exchange micro guidewire was reintroduced with the  distal end in the petrous horizontal segment. The balloon guide was then retrieved into the right common carotid artery. A diagnostic arteriogram performed through the balloon guide now demonstrated patency of the right internal carotid proximally associated with caliber irregularity, and a flat smooth filling defect in its mid cervical segment. It was decided to proceed with placement of a 6/8 mm x 4 mm Xact stent. The stent delivery apparatus was prepped and purged with heparinized saline infusion. Thereafter, using the rapid exchange technique, the stent delivery system was advanced and positioned such that the distal marker was just distal to the smooth flat filling defect and proximal was just inside the right common carotid artery. The stent was then deployed without any difficulty. The delivery system was then retrieved and removed. A control arteriogram performed through the balloon guide in the right common carotid artery demonstrated excellent apposition with smooth contour of the internal carotid artery in the stented segment. A control arteriogram performed more distally demonstrated significantly improved caliber and flow through the internal carotid artery proximally and distally maintaining a TICI 3 revascularization of the right MCA distribution. Patient was given 81 mg of aspirin, and 180 mg of Brilinta via an orogastric tube prior to the angioplasty, and also was given 6 mg of Integrilin intra-arterially. Control arteriograms were then performed at 15 and 30 minutes post deployment of the stent. These continued to demonstrate excellent flow through the stented segment, and also intracranially. Balloon guide was then retrieved and removed. Diagnostic catheter was then advanced to the left common carotid artery over a 0.035 inch Roadrunner guidewire. Arteriogram performed at this site demonstrated left internal carotid artery at the bulb to the cranial skull base to be widely patent. Mild to moderate  fusiform dilatation was noted of the proximal cavernous segment. More distally the supraclinoid segments demonstrate wide patency. The left middle cerebral artery and the left anterior cerebral artery opacify into the capillary and venous phases. Transient cross-filling via the anterior communicating artery of the right anterior cerebral A2 segment was noticeable. Balloon guide was  then retrieved and removed. An 8 French Angio-Seal closure device was then applied for hemostasis at the right femoral puncture site. Distal pulses remained present in both feet unchanged. Post CT of the brain demonstrated blush in the right basal ganglia without mass effect or shift. The patient was left intubated awaiting the results of the COVID-19 test. 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 PLAN: Follow-up in the clinic 4 weeks post discharge. Electronically Signed   By: Julieanne Cotton M.D.   On: 01/26/2021 16:06   CT HEAD CODE STROKE WO CONTRAST  Result Date: 01/24/2021 CLINICAL DATA:  Code stroke.  Slurred speech and left-sided deficits EXAM: CT HEAD WITHOUT CONTRAST TECHNIQUE: Contiguous axial images were obtained from the base of the skull through the vertex without intravenous contrast. COMPARISON:  None. FINDINGS: Brain: There is no mass, hemorrhage or extra-axial collection. The size and configuration of the ventricles and extra-axial CSF spaces are normal. There is loss of gray-white differentiation within the right insula and mid MCA territory. Vascular: Hyperdense appearance of the right MCA. Skull: The  visualized skull base, calvarium and extracranial soft tissues are normal. Sinuses/Orbits: No fluid levels or advanced mucosal thickening of the visualized paranasal sinuses. No mastoid or middle ear effusion. The orbits are normal. ASPECTS [facility] Stroke Program Early CT Score) - Ganglionic level infarction (caudate, lentiform nuclei, internal capsule, insula, M1-M3 cortex): 5 - Supraganglionic infarction (M4-M6 cortex): 2 Total score (0-10 with 10 being normal): 7 IMPRESSION: 1. 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 2. Hyperdense appearance of the right MCA. 3. ASPECTS is 7. These results were called by telephone at the time of interpretation on 01/24/2021 at 10:58 pm to provider Caryl Pina, who verbally acknowledged these results. Electronically Signed   By: Deatra Robinson M.D.   On: 01/24/2021 22:58   VAS US CAROTID  Result Date: 01/25/2021 Carotid Arterial Duplex Study Indications:       Right stent and Check post-op patency of RT ICA stent. Other Factors:     No medical history on file. Limitations        Today's exam was limited due to the post surgical status of                    the patient and tissue properties. Comparison Study:  No previous exams Performing Technologist: Ernestene Mention  Examination Guidelines: A complete evaluation includes B-mode imaging, spectral Doppler, color Doppler, and power Doppler as needed of all accessible portions of each vessel. Bilateral testing is considered an integral part of a complete examination. Limited examinations for reoccurring indications Moffat be performed as noted.  Right Carotid Findings: +----------+--------+--------+--------+------------------+------------------+           PSV cm/sEDV cm/sStenosisPlaque DescriptionComments           +----------+--------+--------+--------+------------------+------------------+ CCA Prox  91      14                                intimal thickening  +----------+--------+--------+--------+------------------+------------------+ CCA Distal83      11  hypoechoic        intimal thickening +----------+--------+--------+--------+------------------+------------------+ ECA       108     0                                                    +----------+--------+--------+--------+------------------+------------------+ +----------+--------+-------+----------------+-------------------+           PSV cm/sEDV cmsDescribe        Arm Pressure (mmHG) +----------+--------+-------+----------------+-------------------+ [id number]            Multiphasic, WNL                    +----------+--------+-------+----------------+-------------------+ +---------+--------+--+--------+-+---------+ VertebralPSV cm/s48EDV cm/s7Antegrade +---------+--------+--+--------+-+---------+  Right Stent(s): +---------------+---+--+-------------++-----------------------------+ Prox to Stent  10918<50% stenosis                              +---------------+---+--+-------------++-----------------------------+ Proximal Stent 14220<50% stenosis                              +---------------+---+--+-------------++-----------------------------+ Mid Stent      12426<50% stenosis                              +---------------+---+--+-------------++-----------------------------+ Distal Stent   17531<50% stenosis                              +---------------+---+--+-------------++-----------------------------+ Distal to Stent13436<50% stenosisDifficult to see end of stent +---------------+---+--+-------------++-----------------------------+    Summary: Right Carotid: The extracranial vessels were near-normal with only minimal wall                thickening or plaque. The ICA stent appears patent in it's                entirety. Vertebrals:  Right vertebral artery demonstrates antegrade flow. Subclavians: Normal flow hemodynamics were  seen in the right subclavian artery. *See table(s) above for measurements and observations.  Electronically signed by Coral Else MD on 01/25/2021 at 9:26:22 PM.    Final    CT ANGIO HEAD CODE STROKE  Result Date: 01/24/2021 CLINICAL DATA:  Left-sided deficits EXAM: CT ANGIOGRAPHY HEAD AND NECK CT PERFUSION BRAIN TECHNIQUE: Multidetector CT imaging of the head and neck was performed using the standard protocol during bolus administration of intravenous contrast. Multiplanar CT image reconstructions and MIPs were obtained to evaluate the vascular anatomy. Carotid stenosis measurements (when applicable) are obtained utilizing NASCET criteria, using the distal internal carotid diameter as the denominator. Multiphase CT imaging of the brain was performed following IV bolus contrast injection. Subsequent parametric perfusion maps were calculated using RAPID software. CONTRAST:  OMNIPAQUE IOHEXOL 350 MG/ML SOLN COMPARISON:  None. FINDINGS: CTA NECK FINDINGS SKELETON: There is no bony spinal canal stenosis. No lytic or blastic lesion. OTHER NECK: Normal pharynx, larynx and major salivary glands. No cervical lymphadenopathy. Unremarkable thyroid gland. UPPER CHEST: No pneumothorax or pleural effusion. No nodules or masses. AORTIC ARCH: There is calcific atherosclerosis of the aortic arch. There is no aneurysm, dissection or hemodynamically significant stenosis of the visualized portion of the aorta. Conventional 3 vessel aortic branching pattern. The visualized proximal subclavian arteries are widely patent. RIGHT CAROTID SYSTEM: Right ICA is occluded  at its origin. The right common and external carotid arteries are normal. LEFT CAROTID SYSTEM: No dissection, occlusion or aneurysm. Mild atherosclerotic calcification at the carotid bifurcation without hemodynamically significant stenosis. VERTEBRAL ARTERIES: Left dominant configuration. Both origins are clearly patent. There is no dissection, occlusion or  flow-limiting stenosis to the skull base (V1-V3 segments). CTA HEAD FINDINGS POSTERIOR CIRCULATION: --Vertebral arteries: Normal V4 segments. --Inferior cerebellar arteries: Normal. --Basilar artery: Normal. --Superior cerebellar arteries: Normal. --Posterior cerebral arteries (PCA): Normal. ANTERIOR CIRCULATION: --Intracranial internal carotid arteries: Normal. --Anterior cerebral arteries (ACA): Normal. Both A1 segments are present. Patent anterior communicating artery (a-comm). --Middle cerebral arteries (MCA): Complete occlusion of the right MCA at its origin. There is no collateral flow demonstrated. VENOUS SINUSES: As permitted by contrast timing, patent. ANATOMIC VARIANTS: None Review of the MIP images confirms the above findings. CT Brain Perfusion Findings: ASPECTS: 7 CBF (<30%) Volume: 68mL Perfusion (Tmax>6.0s) volume: 2 L78mL Mismatch Volume: Infarction Location:Right MCA territory IMPRESSION: 1. 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 2. 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 3. 68 mL right MCA territory core infarct with 133 mL area of ischemic penumbra. Critical Value/emergent results were called by telephone at the time of interpretation on 01/24/2021 at 11:24 pm to provider ERIC [facility] , who verbally acknowledged these results. Aortic Atherosclerosis (ICD10-I70.0). Electronically Signed   By: Deatra Robinson M.D.   On: 01/24/2021 23:34   CT ANGIO NECK CODE STROKE  Result Date: 01/24/2021 CLINICAL DATA:  Left-sided deficits EXAM: CT ANGIOGRAPHY HEAD AND NECK CT PERFUSION BRAIN TECHNIQUE: Multidetector CT imaging of the head and neck was performed using the standard protocol during bolus administration of intravenous contrast. Multiplanar CT image reconstructions and MIPs were obtained to evaluate the vascular anatomy. Carotid stenosis measurements (when applicable) are obtained  utilizing NASCET criteria, using the distal internal carotid diameter as the denominator. Multiphase CT imaging of the brain was performed following IV bolus contrast injection. Subsequent parametric perfusion maps were calculated using RAPID software. CONTRAST:  OMNIPAQUE IOHEXOL 350 MG/ML SOLN COMPARISON:  None. FINDINGS: CTA NECK FINDINGS SKELETON: There is no bony spinal canal stenosis. No lytic or blastic lesion. OTHER NECK: Normal pharynx, larynx and major salivary glands. No cervical lymphadenopathy. Unremarkable thyroid gland. UPPER CHEST: No pneumothorax or pleural effusion. No nodules or masses. AORTIC ARCH: There is calcific atherosclerosis of the aortic arch. There is no aneurysm, dissection or hemodynamically significant stenosis of the visualized portion of the aorta. Conventional 3 vessel aortic branching pattern. The visualized proximal subclavian arteries are widely patent. 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 The right common and external carotid arteries are normal. LEFT CAROTID SYSTEM: No dissection, occlusion or aneurysm. Mild atherosclerotic calcification at the carotid bifurcation without hemodynamically significant stenosis. VERTEBRAL ARTERIES: Left dominant configuration. Both origins are clearly patent. There is no dissection, occlusion or flow-limiting stenosis to the skull base (V1-V3 segments). CTA HEAD FINDINGS POSTERIOR CIRCULATION: --Vertebral arteries: Normal V4 segments. --Inferior cerebellar arteries: Normal. --Basilar artery: Normal. --Superior cerebellar arteries: Normal. --Posterior cerebral arteries (PCA): Normal. ANTERIOR CIRCULATION: --Intracranial internal carotid arteries: Normal. --Anterior cerebral arteries (ACA): Normal. Both A1 segments are present. Patent anterior communicating artery (a-comm). --Middle cerebral arteries (MCA): Complete occlusion of the right MCA at its origin. There is no collateral flow demonstrated. VENOUS SINUSES: As  permitted by contrast timing, patent. ANATOMIC VARIANTS: None Review of the MIP images confirms  the above findings. CT Brain Perfusion Findings: ASPECTS: 7 CBF (<30%) Volume: 37mL Perfusion (Tmax>6.0s) volume: 2 L48mL Mismatch Volume: Infarction Location:Right MCA territory IMPRESSION: 1. 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 2. 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 3. 68 mL right MCA territory core infarct with 133 mL area of ischemic penumbra. Critical Value/emergent results were called by telephone at the time of interpretation on 01/24/2021 at 11:24 pm to provider ERIC [facility] , who verbally acknowledged these results. Aortic Atherosclerosis (ICD10-I70.0). Electronically Signed   By: Deatra Robinson M.D.   On: 01/24/2021 23:34   IR ANGIO INTRA EXTRACRAN SEL COM CAROTID INNOMINATE UNI L MOD SED  Result Date: 01/29/2021 INDICATION: Left sided hemiplegia, right gaze deviation and neglect on left-side. Occluded right internal carotid artery proximally, and right middle cerebral artery and the right anterior cerebral artery. EXAM: 1. EMERGENT LARGE VESSEL OCCLUSION THROMBOLYSIS (anterior CIRCULATION) COMPARISON:  CT angiogram of the head and neck of January 24, 2021. MEDICATIONS: Ancef 2 g IV antibiotic was administered within 1 hour of the procedure. ANESTHESIA/SEDATION: General anesthesia. CONTRAST:  Isovue 300 approximately 160 mL. FLUOROSCOPY TIME:  Fluoroscopy Time: 96 minutes 18 seconds (3550 mGy). COMPLICATIONS: None immediate. TECHNIQUE: Following a full explanation of the procedure along with the potential associated complications, an informed witnessed consent was obtained. The risks of intracranial hemorrhage of 10%, worsening neurological deficit, ventilator dependency, death and inability to revascularize were all reviewed in detail with the patient's spouse. The patient was then put  under general anesthesia by the Department of Anesthesiology at [facility]. The right groin was prepped and draped in the usual sterile fashion. Thereafter using modified Seldinger technique, transfemoral access into the right common femoral artery was obtained without difficulty. Over a 0.035 inch guidewire an 8 [place] Pinnacle 25 cm sheath was inserted. Through this, and also over a 0.035 inch guidewire a 5 [place] JB 1 catheter was advanced to the aortic arch region and selectively positioned in the right common carotid artery. FINDINGS: The right common carotid arteriogram demonstrates the right external carotid artery and its major branches to be widely patent. 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 More distally no reconstitution of the right internal carotid artery is seen to the cranial skull base from the external carotid artery branches. PROCEDURE: Over a 0.035 inch 300 cm Rosen exchange guidewire an 087 balloon guide catheter which had been prepped with 50% contrast and 50% heparinized saline infusion was advanced and positioned just proximal to the right common carotid bifurcation. The guidewire was removed. Good aspiration obtained from the hub of the balloon guide catheter. A gentle control arteriogram performed through this continued to demonstrate the stump of the occluded right internal carotid artery. Over a 0.014 inch standard Synchro micro guidewire with a mild J configuration an 021 160 cm Trevo ProVue microcatheter was advanced without difficulty to the horizontal petrous right ICA. The guidewire was removed. Good aspiration obtained from the hub of the microcatheter. A gentle control arteriogram performed through the microcatheter demonstrated opacification of the right internal carotid artery distally with stasis. The microcatheter was then exchanged for an 014 inch 300 cm Transend EX micro guidewire with a mild J configuration. A 4  mm x 30 mm Viatrac angioplasty balloon catheter which had been prepped and purged with heparinized saline infusion was advanced using the rapid  exchange technique and positioned with its markers optimally in the proximal right internal carotid artery. A control inflation was then performed via a micro inflation syringe device via micro tubing. 4 mm diameter level for approximately 60 seconds. Thereafter, balloon was deflated and retrieved and removed. A control arteriogram was performed through the balloon catheter in the right internal carotid artery proximally now demonstrated modestly increased flow more distally to the horizontal petrous segment. The combination of an 021 microcatheter, and the 136 cm 55 Zoom aspiration catheter was advanced to the distal end of the exchange micro guidewire. The micro guidewire was removed. Good aspiration obtained from the hub of the Zoom aspiration catheter. A gentle control arteriogram performed through the Zoom aspiration catheter now demonstrated opacification of the right internal carotid artery in the distal cervical petrous and caval cavernous segment. Complete occlusion with multiple filling defects was demonstrable in the origin of the ophthalmic artery. An 014 inch standard Synchro micro guidewire was reintroduced into the microcatheter, and advanced without difficulty through the occluded distal cavernous supraclinoid right ICA into the right middle cerebral artery inferior division in the M2 M3 region followed by the microcatheter. The guidewire was removed. Good aspiration was obtained from the hub of the microcatheter. Gentle control arteriogram performed through the microcatheter demonstrated safe position of the tip of the microcatheter which was then connected to continuous heparinized saline infusion. A 6.5 mm x 47 mm retrieval device was then advanced to the distal end of the microcatheter. The retrieval was then deployed. The Zoom aspiration catheter was  then advanced into the occluded M1 segment. With proximal flow arrest in the right common carotid artery, and constant aspiration being applied the hub of the Zoom aspiration catheter, and the 55 aspiration catheter for approximately 2-1/2 minutes, the combination of the retrieval device, the microcatheter, and the Zoom aspiration catheter were retrieved and removed. Copious amount of clot were seen entangled in the retrieval device and in the canister. Arteriogram performed following reversal of flow arrest, demonstrated revascularization of the inferior division of the right middle cerebral artery with persistent occlusion of the superior division achieving a TICI 2b revascularization. With the balloon guide in the proximal right internal carotid artery, the combination of the 55 Zoom aspiration catheter, the 021 microcatheter over a 0.014 inch standard Synchro micro guidewire was advanced to the supraclinoid right ICA. Using a torque device, access was obtained into the superior division M2 M3 region followed by the advancement of the microcatheter. The micro guidewire was removed. Good aspiration obtained from the hub of the microcatheter. A gentle control arteriogram performed through the microcatheter demonstrated safe position of the tip of the microcatheter. The Zoom aspiration catheter was advanced to just at the origin of the superior division. A 6.5 mm x 47 mm Embotrap retrieval device was then deployed in the usual manner as described above. With proximal flow arrest in the right internal carotid artery, and constant aspiration applied at the hub of the balloon guide catheter and at the hub of the 55 aspiration catheter for approximately 2 minutes, the combination of the retrieval device, the microcatheter, and the 55 Zoom aspiration catheter were retrieved and removed. Again clot was seen entangled in the retrieval device. A control arteriogram performed through the balloon guide following flow reversal  in the right internal carotid artery now demonstrated complete angiographic revascularization of the right internal carotid artery distally, and also the petrous, cavernous and supraclinoid segments. The right middle cerebral artery distribution demonstrated a TICI  3 revascularization. The right anterior cerebral artery remained widely patent. With the balloon guide catheter in the mid right internal carotid artery, an exchange micro guidewire was reintroduced with the distal end in the petrous horizontal segment. The balloon guide was then retrieved into the right common carotid artery. A diagnostic arteriogram performed through the balloon guide now demonstrated patency of the right internal carotid proximally associated with caliber irregularity, and a flat smooth filling defect in its mid cervical segment. It was decided to proceed with placement of a 6/8 mm x 4 mm Xact stent. The stent delivery apparatus was prepped and purged with heparinized saline infusion. Thereafter, using the rapid exchange technique, the stent delivery system was advanced and positioned such that the distal marker was just distal to the smooth flat filling defect and proximal was just inside the right common carotid artery. The stent was then deployed without any difficulty. The delivery system was then retrieved and removed. A control arteriogram performed through the balloon guide in the right common carotid artery demonstrated excellent apposition with smooth contour of the internal carotid artery in the stented segment. A control arteriogram performed more distally demonstrated significantly improved caliber and flow through the internal carotid artery proximally and distally maintaining a TICI 3 revascularization of the right MCA distribution. Patient was given 81 mg of aspirin, and 180 mg of Brilinta via an orogastric tube prior to the angioplasty, and also was given 6 mg of Integrilin intra-arterially. Control arteriograms were  then performed at 15 and 30 minutes post deployment of the stent. These continued to demonstrate excellent flow through the stented segment, and also intracranially. Balloon guide was then retrieved and removed. Diagnostic catheter was then advanced to the left common carotid artery over a 0.035 inch Roadrunner guidewire. Arteriogram performed at this site demonstrated left internal carotid artery at the bulb to the cranial skull base to be widely patent. Mild to moderate fusiform dilatation was noted of the proximal cavernous segment. More distally the supraclinoid segments demonstrate wide patency. The left middle cerebral artery and the left anterior cerebral artery opacify into the capillary and venous phases. Transient cross-filling via the anterior communicating artery of the right anterior cerebral A2 segment was noticeable. Balloon guide was then retrieved and removed. An 8 French Angio-Seal closure device was then applied for hemostasis at the right femoral puncture site. Distal pulses remained present in both feet unchanged. Post CT of the brain demonstrated blush in the right basal ganglia without mass effect or shift. The patient was left intubated awaiting the results of the COVID-19 test. 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 PLAN: Follow-up in the clinic 4 weeks post discharge. Electronically Signed   By: Julieanne Cotton M.D.   On: 01/26/2021 16:06    Labs:  CBC: Recent Labs    02/07/21 0234 02/08/21 0347 02/13/21 0734 02/14/21 0256  WBC 7.4 8.5 4.3 4.0  HGB 9.6* 9.6* 13.1  12.0*  HCT 31.3* 29.9* 39.4 37.2*  PLT 389 386 308 288    COAGS: Recent Labs    01/24/21 2235  INR 1.0  APTT 32    BMP: Recent Labs    02/08/21 0347 02/11/21  1610 02/12/21 0819 02/14/21 0256  NA 140 137 136 135  K 3.5 3.7 4.0 4.5  CL 109 105 106 102  CO2 GLUCOSE 122* 128* 155* 140*  BUN CALCIUM 7.9* 8.3* 8.4* 8.5*  CREATININE 0.71 0.69 0.73 0.79  GFRNONAA >60 >60 >60 >60    LIVER FUNCTION TESTS: Recent Labs    02/05/21 0346 02/06/21 0526 02/07/21 0234 02/08/21 0347  BILITOT 1.0 1.0 0.9 1.0  AST 93* 88* 75* 63*  ALT 95* 107* 105* 94*  ALKPHOS 146* 120 111 100  PROT 6.3* 5.5* 5.5* 5.2*  ALBUMIN 2.4* 2.3* 2.5* 2.4*     Assessment and Plan:  History of acute CVA s/p cerebral arteriogram with emergent mechanical thrombectomy of right MCA, proximal right ACA, and right ICA terminus occlusions achieving a TICI 3 revascularization, along with revascularization of proximal right ICA acute occlusion using stent assisted angioplasty via right femoral approach 01/25/2021 by Dr. Corliss Skains; with residual dysphagia with NGT in place in need of additional means of nutritional support. Plan for image-guided percutaneous gastrostomy tube placement in IR tentatively for tomorrow 02/15/2021 pending IR scheduling. Patient will be NPO at midnight including holding of tube feeds prior to procedure.  Afebrile. Will hold Heparin per IR protocol. Patient currently on DAPT (Brilinta/Aspirin)- per neurology (Dr. Roda Shutters) and NIR (Dr. Corliss Skains) patient cannot come off DAPT due to recent CVA/stent placement (more of a risk to come off DAPT than bleeding risk of placing while on DAPT)- Dr. Edgar Frisk. Grace Isaac (IR) made aware and agree for placement on this. Wife updated on increased bleeding risk given this as well. INR 1.0 01/24/2021. Off COVID-19 precautions at this time, ok to proceed per IR protocol.  Risks and benefits discussed with the patient including, but not  limited to the need for a barium enema during the procedure, bleeding, infection, peritonitis, or damage to adjacent structures. All of the patient's wife's questions were answered, she is agreeable to proceed. Consent obtained by patient's wife, Riddick Nuon, via telephone- signed and in IR control room.   Thank you for this interesting consult.  I greatly enjoyed meeting Mykle [place] Shurley and look forward to participating in their care.  A copy of this report was sent to the requesting provider on this date.  Electronically Signed: Elwin Mocha, PA-C 02/14/2021, 10:37 AM   I spent a total of 20 Minutes in face to face in clinical consultation, greater than 50% of which was counseling/coordinating care for dysphagia/percutaneous gastrostomy tube placement.

## 2021-02-14 NOTE — Progress Notes (Signed)
Inpatient Rehab Admissions Coordinator:   Pt. Off COVID precautions now, to have PEG placed tomorrow. I will proceed to open a case with Pt.'s insurance for potential CIR admit later this week.

## 2021-02-14 NOTE — Progress Notes (Signed)
Modified Barium Swallow Progress Note  Patient Details  Name: Leonard [name] MRN: [id number] Date of Birth: [birth year]  Today's Date: 02/14/2021  Modified Barium Swallow completed.  Full report located under Chart Review in the Imaging Section.  Brief recommendations include the following:  Clinical Impression  Pt presents with improved oropharyngeal function compared to that noted during the last MBS on 01/30/21. Pt demonstrated oropharyngeal dysphagia characterized by reduced labial seal and stripping, weak manipulation, impaired bolus propulsion, reduced bolus cohesion, a pharyngeal delay, premature spillage to the valleculae with intermittent spillover to the pyriform sinuses, reduced anterior laryngeal movement, and reduced pharyngeal constriction. He demonstrated anterior spillage to the left, residue in the left lateral sulcus, difficulty with A-P transport, vallecular residue, pyriform sinuse residue, and posterior pharyngeal wall residue. He inermittently demonstrated incomplete epiglottic inversion which facilitated penetration (PAS 3) and occasional aspiration (PAS 7) of thin liquids and once with a dual consistency bolus (i.e., nectar thick liquids with masticated regular textures). Pt's cough was inconsistently delayed and variability in effectiveness was also noted. No functional benefit was noted with pt's independent use of a posterior head tilt to facilitate A-P transport of solids. Regular textures, a barium tablet, and dysphagia 3 solids were ultimately removed from the oral cavity after multiple unsuccessful attempts at A-P transport. Amount of pharyngeal residue increased with advancement of solids, but was improved with a liquid wash and independent secondary swallows. A dysphagia 2 diet with nectar thick liquids will be initiated at this time. SLP is hopeful that G-tube placement can be avoided if p.o. intake is tolerated and quantity is adequate. Pt's results and recommendations were  discussed with Dr. Jerral Ralph and Ralene Muskrat, [facility]; it was agreed that pt's Cortrak will be removed, and that G-tube placement will reassessed on Monday. SLP will continue to follow pt for treatment.    Swallow Evaluation Recommendations       SLP Diet Recommendations: Dysphagia 2 (Fine chop) solids;Nectar thick liquid   Liquid Administration via: Cup;No straw   Medication Administration: Crushed with puree   Supervision: Full assist for feeding;Full supervision/cueing for compensatory strategies   Compensations: Slow rate;Small sips/bites;Multiple dry swallows after each bite/sip;Follow solids with liquid   Postural Changes: Seated upright at 90 degrees   Oral Care Recommendations: Oral care BID   Other Recommendations: Order thickener from pharmacy  [place] I. Vear Clock, MS, CCC-SLP Acute Rehabilitation Services Office number [phone] Pager [phone]   Scheryl Marten 02/14/2021,3:31 PM

## 2021-02-14 NOTE — Progress Notes (Signed)
Physical Therapy Treatment Patient Details Name: Leonard [name] MRN: [id number] DOB: 11-Sep-1956 Today's Date: 02/14/2021    History of Present Illness 65 y.o. male who presented 4/20 with acute onset L hemiplegia, L facial droop, dysarthria, and L hemineglect. tPA was administered. 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 S/p bil common carotid arteriograms and revascularization of occluded R MCA and R ACA prox and R ICA terminus. ETT 4/20-4/21. COVID + 4/30. No significant PMH.    PT Comments    Session limited by symptomatic orthostasis. Patient with reports of dizziness upon standing with 20 point drop in BP from sitting. Patient with resolution of symptoms following seated rest break but with symptoms upon standing on 2nd attempt. Deferred mobility this date due to patient being symptomatic and difficulty expressing symptoms due to impaired communication. Continue to recommend comprehensive inpatient rehab (CIR) for post-acute therapy needs.    Orthostatic BPs  Supine 126/74 HR 95  Sitting 115/68 HR 115  Standing 94/67 HR 116  Sitting 106/68 HR 113  Standing  90/58 HR 128     Follow Up Recommendations  CIR;Supervision/Assistance - 24 hour     Equipment Recommendations  3in1 (PT);Wheelchair (measurements PT);Wheelchair cushion (measurements PT)    Recommendations for Other Services       Precautions / Restrictions Precautions Precautions: Fall Precaution Comments: L neglect Restrictions Weight Bearing Restrictions: No    Mobility  Bed Mobility Overal bed mobility: Needs Assistance Bed Mobility: Supine to Sit;Sit to Supine     Supine to sit: Min assist;HOB elevated Sit to supine: Min assist   General bed mobility comments: assist to bring L LE off  bed and elevate trunk. Multimodal cueing required for sequencing as patient easily distracted    Transfers Overall transfer level: Needs assistance Equipment used: 1 person hand held assist Transfers: Sit to/from Stand Sit to Stand: Min assist;+2 safety/equipment         General transfer comment: MinA for boost up into standing and steadying  Ambulation/Gait             General Gait Details: deferred due to symptomatic orthostasis   Stairs             Wheelchair Mobility    Modified Rankin (Stroke Patients Only) Modified Rankin (Stroke Patients Only) Pre-Morbid Rankin Score: No symptoms Modified Rankin: Moderately severe disability     Balance Overall balance assessment: Needs assistance Sitting-balance support: Feet supported;No upper extremity supported Sitting balance-Leahy Scale: Fair     Standing balance support: Single extremity supported;During functional activity Standing balance-Leahy Scale: Poor Standing balance comment: UE support and min guard for static standing. Standing limited by orthostatic                            Cognition Arousal/Alertness: Awake/alert Behavior During Therapy: WFL for tasks assessed/performed;Impulsive Overall Cognitive Status: Impaired/Different from baseline Area of Impairment: Attention;Memory;Following commands;Safety/judgement;Awareness;Problem solving                   Current Attention Level: Sustained Memory: Decreased recall of precautions;Decreased short-term memory Following Commands: Follows one step commands consistently;Follows one step commands with increased time Safety/Judgement: Decreased awareness of safety;Decreased awareness of deficits Awareness: Intellectual Problem Solving: Slow processing;Decreased initiation;Difficulty sequencing;Requires verbal cues;Requires tactile cues  General Comments: following simple commands with repetition. Easily distracted and needing  multimodal cueing to redirect      Exercises      General Comments        Pertinent Vitals/Pain Pain Assessment: No/denies pain    Home Living                      Prior Function            PT Goals (current goals can now be found in the care plan section) Acute Rehab PT Goals Patient Stated Goal: to get better/ get out of here PT Goal Formulation: With patient/family Time For Goal Achievement: 02/23/21 Potential to Achieve Goals: Fair Progress towards PT goals: Progressing toward goals    Frequency    Min 4X/week      PT Plan Current plan remains appropriate    Co-evaluation              AM-PAC PT "6 Clicks" Mobility   Outcome Measure  Help needed turning from your back to your side while in a flat bed without using bedrails?: A Little Help needed moving from lying on your back to sitting on the side of a flat bed without using bedrails?: A Little Help needed moving to and from a bed to a chair (including a wheelchair)?: A Little Help needed standing up from a chair using your arms (e.g., wheelchair or bedside chair)?: A Little Help needed to walk in hospital room?: A Lot Help needed climbing 3-5 steps with a railing? : Total 6 Click Score: 15    End of Session Equipment Utilized During Treatment: Gait belt Activity Tolerance: Treatment limited secondary to medical complications (Comment) (orthostatics) Patient left: in bed;with call bell/phone within reach;with bed alarm set Nurse Communication: Mobility status PT Visit Diagnosis: Unsteadiness on feet (R26.81);Other abnormalities of gait and mobility (R26.89);Muscle weakness (generalized) (M62.81);Difficulty in walking, not elsewhere classified (R26.2);Other symptoms and signs involving the nervous system (R29.898);Hemiplegia and hemiparesis Hemiplegia - Right/Left: Left Hemiplegia - caused by: Cerebral infarction     Time: 2671-2458 PT Time Calculation (min) (ACUTE ONLY): 24  min  Charges:  $Therapeutic Activity: 23-37 mins                     Deasiah Hagberg A. Dan Humphreys PT, DPT Acute Rehabilitation Services Pager [phone] Office [phone]    Viviann Spare 02/14/2021, 4:56 PM

## 2021-02-14 NOTE — Progress Notes (Signed)
  Speech Language Pathology Treatment: Dysphagia  Patient Details Name: Leonard [name] MRN: [id number] DOB: [birth year] Today's Date: 02/14/2021 Time: 1025-8527 SLP Time Calculation (min) (ACUTE ONLY): 22 min  Assessment / Plan / Recommendation Clinical Impression  Patient with improved cognitive and swallowing function at bedside today as compared to documentation from previous treatment sessions. Patient is alert, cooperative, communicative, and appropriate today, requiring no cueing from SLP to maintain appropriate level of alertness for po intake. Patient able to consume trials of ice chips and pureed solids under skilled SLP observation and with SLP providing assistance with feeding. Observed timely appearing oral transit of bolus with full oral clearance. Clear vocal quality throughout po trials also noted. One time delayed cough post intake of ice chips to suggest possible decreased airway protection but no other s/s of aspiration indicated today. Note that oral cavity is much improved also signifying improved ability to manage secretions.   PEG tube planned for either 5/12 or 5/13 based on MD notes. Would strongly recommend repeat instrumental testing prior to placement given improvements today. Planning for MBS this afternoon. MD texted.    HPI HPI: Pt is a 65 y.o. male with no significant PMHx who presented via EMS as a code stroke for acute onset of left hemiplegia, left facial droop, dysarthria and left hemineglect. NIHSS was 18 on admission and tPA was given. 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 Pt s/p  s/p bilateral common carotid arteriograms followed by right MCA/ACA/ICA thrombus retrieval with stent angioplasty 4/21. Pt was intubated for procedure. CT head 4/22: Large area of hypoattenuation at the site of known right MCA territory infarct. Small focus of parenchymal hyperdensity at the posterior aspect of  the infarct site [place] indicate a small amount of petechial hemorrhage or contrast extravasation from procedure. Small amount of subarachnoid hyperdensity over the right frontal convexity, either subarachnoid blood or contrast taining.  CXR 4/28 concerning for possible pneumonia.      SLP Plan  MBS       Recommendations  Diet recommendations: NPO Medication Administration: Via alternative means                Oral Care Recommendations: Oral care QID Follow up Recommendations: Inpatient Rehab SLP Visit Diagnosis: Dysphagia, oropharyngeal phase (R13.12) Plan: MBS       GO             Winefred Hillesheim MA, CCC-SLP     Analia Zuk Meryl 02/14/2021, 10:50 AM

## 2021-02-15 ENCOUNTER — Other Ambulatory Visit: Payer: Self-pay

## 2021-02-15 ENCOUNTER — Inpatient Hospital Stay (HOSPITAL_COMMUNITY)
Admission: RE | Admit: 2021-02-15 | Discharge: 2021-03-01 | DRG: 057 | Disposition: A | Discharge status: Home / Self Care | Payer: Medicare HMO | Source: Intra-hospital | Attending: Physical Medicine & Rehabilitation | Admitting: Physical Medicine & Rehabilitation

## 2021-02-15 ENCOUNTER — Encounter (HOSPITAL_COMMUNITY): Payer: Self-pay | Admitting: Physical Medicine & Rehabilitation

## 2021-02-15 DIAGNOSIS — Z8249 Family history of ischemic heart disease and other diseases of the circulatory system: Secondary | ICD-10-CM

## 2021-02-15 DIAGNOSIS — K117 Disturbances of salivary secretion: Secondary | ICD-10-CM | POA: Diagnosis not present

## 2021-02-15 DIAGNOSIS — Z8616 Personal history of COVID-19: Secondary | ICD-10-CM

## 2021-02-15 DIAGNOSIS — K5900 Constipation, unspecified: Secondary | ICD-10-CM | POA: Diagnosis present

## 2021-02-15 DIAGNOSIS — I63511 Cerebral infarction due to unspecified occlusion or stenosis of right middle cerebral artery: Secondary | ICD-10-CM | POA: Diagnosis present

## 2021-02-15 DIAGNOSIS — D649 Anemia, unspecified: Secondary | ICD-10-CM | POA: Diagnosis not present

## 2021-02-15 DIAGNOSIS — D61818 Other pancytopenia: Secondary | ICD-10-CM | POA: Diagnosis not present

## 2021-02-15 DIAGNOSIS — Z79899 Other long term (current) drug therapy: Secondary | ICD-10-CM | POA: Diagnosis not present

## 2021-02-15 DIAGNOSIS — I69354 Hemiplegia and hemiparesis following cerebral infarction affecting left non-dominant side: Principal | ICD-10-CM

## 2021-02-15 DIAGNOSIS — H04123 Dry eye syndrome of bilateral lacrimal glands: Secondary | ICD-10-CM | POA: Diagnosis not present

## 2021-02-15 DIAGNOSIS — Z28310 Unvaccinated for covid-19: Secondary | ICD-10-CM

## 2021-02-15 DIAGNOSIS — Z7982 Long term (current) use of aspirin: Secondary | ICD-10-CM

## 2021-02-15 DIAGNOSIS — I69391 Dysphagia following cerebral infarction: Secondary | ICD-10-CM

## 2021-02-15 DIAGNOSIS — R1312 Dysphagia, oropharyngeal phase: Secondary | ICD-10-CM | POA: Diagnosis not present

## 2021-02-15 DIAGNOSIS — E785 Hyperlipidemia, unspecified: Secondary | ICD-10-CM | POA: Diagnosis present

## 2021-02-15 DIAGNOSIS — I6601 Occlusion and stenosis of right middle cerebral artery: Secondary | ICD-10-CM

## 2021-02-15 DIAGNOSIS — I10 Essential (primary) hypertension: Secondary | ICD-10-CM | POA: Diagnosis present

## 2021-02-15 DIAGNOSIS — H5711 Ocular pain, right eye: Secondary | ICD-10-CM | POA: Diagnosis not present

## 2021-02-15 DIAGNOSIS — I69322 Dysarthria following cerebral infarction: Secondary | ICD-10-CM | POA: Diagnosis not present

## 2021-02-15 DIAGNOSIS — I69392 Facial weakness following cerebral infarction: Secondary | ICD-10-CM

## 2021-02-15 DIAGNOSIS — R339 Retention of urine, unspecified: Secondary | ICD-10-CM | POA: Diagnosis present

## 2021-02-15 DIAGNOSIS — D709 Neutropenia, unspecified: Secondary | ICD-10-CM | POA: Diagnosis present

## 2021-02-15 LAB — BASIC METABOLIC PANEL
Anion gap: 6 (ref 5–15)
BUN: 15 mg/dL (ref 8–23)
CO2: 26 mmol/L (ref 22–32)
Calcium: 8.3 mg/dL — ABNORMAL LOW (ref 8.9–10.3)
Chloride: 102 mmol/L (ref 98–111)
Creatinine, Ser: 0.82 mg/dL (ref 0.61–1.24)
GFR, Estimated: >60 mL/min (ref >60)
Glucose, Bld: 108 mg/dL — ABNORMAL HIGH (ref 70–99)
Potassium: 4.5 mmol/L (ref 3.5–5.1)
Sodium: 134 mmol/L — ABNORMAL LOW (ref 135–145)

## 2021-02-15 LAB — CBC
HCT: 35.5 % — ABNORMAL LOW (ref 39.0–52.0)
Hemoglobin: 11.4 g/dL — ABNORMAL LOW (ref 13.0–17.0)
MCH: 29.9 pg (ref 26.0–34.0)
MCHC: 32.1 g/dL (ref 30.0–36.0)
MCV: 93.2 fL (ref 80.0–100.0)
Platelets: 244 10*3/uL (ref 150–400)
RBC: 3.81 MIL/uL — ABNORMAL LOW (ref 4.22–5.81)
RDW: 15.8 % — ABNORMAL HIGH (ref 11.5–15.5)
WBC: 3.3 10*3/uL — ABNORMAL LOW (ref 4.0–10.5)
nRBC: 0 % (ref 0.0–0.2)

## 2021-02-15 LAB — GLUCOSE, CAPILLARY
Glucose-Capillary: 104 mg/dL — ABNORMAL HIGH (ref 70–99)
Glucose-Capillary: 111 mg/dL — ABNORMAL HIGH (ref 70–99)

## 2021-02-15 MED ORDER — PANTOPRAZOLE SODIUM 40 MG PO TBEC
40.0000 mg | DELAYED_RELEASE_TABLET | Freq: Every day | ORAL | Status: DC
  Administered 2021-02-16 – 2021-02-18 (×3): 40 mg via ORAL
  Filled 2021-02-15 (×3): qty 1

## 2021-02-15 MED ORDER — ACETAMINOPHEN 650 MG RE SUPP
650.0000 mg | RECTAL | Status: DC | PRN
Start: 2021-02-15 — End: 2021-03-01

## 2021-02-15 MED ORDER — TAMSULOSIN HCL 0.4 MG PO CAPS
0.4000 mg | ORAL_CAPSULE | Freq: Every day | ORAL | Status: DC
  Administered 2021-02-15: 0.4 mg via ORAL
  Filled 2021-02-15: qty 1

## 2021-02-15 MED ORDER — HEPARIN SODIUM (PORCINE) 5000 UNIT/ML IJ SOLN: 5000.0000 [IU] | Freq: Three times a day (TID) | INTRAMUSCULAR | Status: DC

## 2021-02-15 MED ORDER — PANTOPRAZOLE SODIUM 40 MG PO TBEC: 40.0000 mg | DELAYED_RELEASE_TABLET | Freq: Every day | ORAL | Status: DC

## 2021-02-15 MED ORDER — TICAGRELOR 90 MG PO TABS
90.0000 mg | ORAL_TABLET | Freq: Two times a day (BID) | ORAL | Status: DC
  Filled 2021-02-15 (×13): qty 1

## 2021-02-15 MED ORDER — ATORVASTATIN CALCIUM 80 MG PO TABS: 80.0000 mg | ORAL_TABLET | Freq: Every day | ORAL | Status: DC

## 2021-02-15 MED ORDER — TICAGRELOR 90 MG PO TABS: 90.0000 mg | ORAL_TABLET | Freq: Two times a day (BID) | ORAL | Status: DC

## 2021-02-15 MED ORDER — ACETAMINOPHEN 325 MG PO TABS: 650.0000 mg | ORAL_TABLET | ORAL | Status: DC | PRN

## 2021-02-15 MED ORDER — ZINC SULFATE 220 (50 ZN) MG PO CAPS
220.0000 mg | ORAL_CAPSULE | Freq: Every day | ORAL | Status: DC
  Administered 2021-02-16 – 2021-03-01 (×14): 220 mg via ORAL
  Filled 2021-02-15 (×14): qty 1

## 2021-02-15 MED ORDER — ASCORBIC ACID 500 MG PO TABS
500.0000 mg | ORAL_TABLET | Freq: Every day | ORAL | Status: DC
Start: 2021-02-16 — End: 2021-03-01
  Administered 2021-02-16 – 2021-03-01 (×14): 500 mg via ORAL
  Filled 2021-02-15 (×14): qty 1

## 2021-02-15 MED ORDER — ASPIRIN 81 MG PO CHEW
81.0000 mg | CHEWABLE_TABLET | Freq: Every day | ORAL | Status: DC
  Filled 2021-02-15 (×10): qty 1

## 2021-02-15 MED ORDER — TAMSULOSIN HCL 0.4 MG PO CAPS
0.4000 mg | ORAL_CAPSULE | Freq: Every day | ORAL | Status: DC
Start: 2021-02-15 — End: 2021-02-28

## 2021-02-15 MED ORDER — HEPARIN SODIUM (PORCINE) 5000 UNIT/ML IJ SOLN
5000.0000 [IU] | Freq: Three times a day (TID) | INTRAMUSCULAR | Status: DC
  Administered 2021-02-15 – 2021-03-01 (×43): 5000 [IU] via SUBCUTANEOUS
  Filled 2021-02-15 (×42): qty 1

## 2021-02-15 MED ORDER — ACETAMINOPHEN 160 MG/5ML PO SOLN
650.0000 mg | ORAL | Status: DC | PRN
Start: 2021-02-15 — End: 2021-03-01

## 2021-02-15 MED ORDER — TICAGRELOR 90 MG PO TABS
90.0000 mg | ORAL_TABLET | Freq: Two times a day (BID) | ORAL | Status: DC
  Administered 2021-02-15 – 2021-03-01 (×28): 90 mg via ORAL
  Filled 2021-02-15 (×26): qty 1

## 2021-02-15 MED ORDER — ALBUTEROL SULFATE HFA 108 (90 BASE) MCG/ACT IN AERS
2.0000 | INHALATION_SPRAY | RESPIRATORY_TRACT | Status: DC | PRN
  Filled 2021-02-15: qty 6.7

## 2021-02-15 MED ORDER — ASPIRIN 81 MG PO CHEW
81.0000 mg | CHEWABLE_TABLET | Freq: Every day | ORAL | Status: DC
Start: 2021-02-16 — End: 2021-03-01
  Administered 2021-02-16 – 2021-03-01 (×14): 81 mg via ORAL
  Filled 2021-02-15 (×14): qty 1

## 2021-02-15 MED ORDER — SENNOSIDES-DOCUSATE SODIUM 8.6-50 MG PO TABS
1.0000 | ORAL_TABLET | Freq: Every day | ORAL | Status: DC
  Administered 2021-02-16 – 2021-03-01 (×14): 1 via ORAL
  Filled 2021-02-15 (×14): qty 1

## 2021-02-15 MED ORDER — ATORVASTATIN CALCIUM 80 MG PO TABS
80.0000 mg | ORAL_TABLET | Freq: Every day | ORAL | Status: DC
  Administered 2021-02-16 – 2021-03-01 (×14): 80 mg via ORAL
  Filled 2021-02-15 (×14): qty 1

## 2021-02-15 NOTE — H&P (Signed)
Physical Medicine and Rehabilitation Admission H&P    Chief Complaint  Patient presents with  . Code Stroke  : HPI: Leonard [name] is a 65 year old right-handed male unremarkable past medical history.  He lives with spouse independent prior to admission.  He has a son that lives nearby.  1 level home 4 steps to entry.  Presented 01/24/2021 with acute onset of left-sided weakness dysarthria and facial droop.  Noted blood pressure severely elevated to 32/124.  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  Patient underwent revascularization per interventional radiology.  Follow-up MRI 01/25/2021 showed a large acute infarct right MCA territory involving the frontal operculum insula and basal ganglia.  No hemorrhage or mass-effect.  Punctate foci of acute ischemia in the right temporal lobe and right thalamus.  MRA was unremarkable.  Echocardiogram with ejection fraction of 60 to 65% no wall motion abnormalities.  Currently maintained on aspirin for CVA prophylaxis as well as Brilinta.  Hospital course complicated by dysphagia patient n.p.o. alternative means of nutritional support and now has been advanced to dysphagia #2 nectar thick liquid diet..  Cleviprex added for blood pressure control.  Hospital course complicated by acute hypoxic respiratory failure due to COVID-19 pneumonia possible aspiration.  Hypoxia resolved treated with steroids/remdesivir/Actemra and broad-spectrum antimicrobial therapy.  Note he was unvaccinated.  Context infectious disease no need for isolation from 02/14/2021 onwards.  He did experience some urinary retention with a Foley tube inserted hand placed on Flomax.  Therapy evaluations completed due to patient's  left-sided weakness dysphagia and dysarthria was admitted for a comprehensive rehab program.  Pt reports he "hasn't pooped in at least 4 days"-but LBM documented 5/11 in early AM;  Was only medium and prior was 5/9- medium; ate 90% lunch Denies pain and spasms.  Using condom cath to void- voiding well.   Review of Systems  Constitutional: Negative for chills and fever.  HENT: Negative for hearing loss.   Eyes: Negative for blurred vision and double vision.  Respiratory: Negative for cough and shortness of breath.   Cardiovascular: Negative for chest pain, palpitations and leg swelling.  Gastrointestinal: Positive for constipation. Negative for heartburn, nausea and vomiting.  Genitourinary: Negative for dysuria, flank pain and hematuria.  Musculoskeletal: Positive for joint pain and myalgias.  Skin: Negative for rash.  Neurological: Positive for sensory change, speech change and weakness.  All other systems reviewed and are negative.  History reviewed. No pertinent past medical history. Past Surgical History:  Procedure Laterality Date  . IR ANGIO INTRA EXTRACRAN SEL COM CAROTID INNOMINATE UNI L MOD SED  01/25/2021  . IR CT HEAD LTD  01/25/2021  . IR CT HEAD LTD  01/25/2021  . IR PERCUTANEOUS ART THROMBECTOMY/INFUSION INTRACRANIAL INC DIAG ANGIO  01/25/2021  . RADIOLOGY WITH ANESTHESIA N/A 01/24/2021   Procedure: IR WITH ANESTHESIA;  Surgeon: Julieanne Cotton, MD;  Location: [facility] OR;  Service: Radiology;  Laterality: N/A;   History reviewed. No pertinent family history. Social History:  reports that he has never smoked. He has never used smokeless tobacco. He reports that he does not drink alcohol and does not use drugs. Allergies: No Known Allergies Medications Prior to Admission  Medication Sig Dispense Refill  . acetaminophen (TYLENOL) 500 MG  tablet Take 1,000 mg by mouth every 6 (six) hours as needed for mild pain.    Marland Kitchen aspirin EC 81 MG tablet Take 81 mg by mouth daily. Swallow  whole.      Drug Regimen Review Drug regimen was reviewed and remains appropriate with no significant issues identified  Home: Home Living Family/patient expects to be discharged to:: Inpatient rehab Living Arrangements: Spouse/significant other Available Help at Discharge: Family,Available PRN/intermittently Type of Home: House Home Access: Stairs to enter [employer] of Steps: 4 steps in front; 5 steps in back Entrance Stairs-Rails: None (rail on left side in back of house) Home Layout: One level Bathroom Shower/Tub: Engineer, manufacturing systems: Standard Bathroom Accessibility: Yes Home Equipment: None Additional Comments: Wife works at a Programme researcher, broadcasting/film/video. Adult son lives nearby.  Lives With: Spouse   Functional History: Prior Function Level of Independence: Independent Comments: Pt is retired but stays busy. Pt used to work as a Teaching laboratory technician.  Functional Status:  Mobility: Bed Mobility Overal bed mobility: Needs Assistance Bed Mobility: Supine to Sit,Sit to Supine Rolling: Mod assist,+2 for physical assistance Sidelying to sit: Mod assist,+2 for physical assistance Supine to sit: Min assist,HOB elevated Sit to supine: Min assist General bed mobility comments: assist to bring L LE off bed and elevate trunk. Multimodal cueing required for sequencing as patient easily distracted Transfers Overall transfer level: Needs assistance Equipment used: 1 person hand held assist Transfers: Sit to/from Stand Sit to Stand: Min assist,+2 safety/equipment Stand pivot transfers: Mod assist General transfer comment: MinA for boost up into standing and steadying Ambulation/Gait Ambulation/Gait assistance: +2 safety/equipment,Min assist Gait Distance (Feet): 6 Feet Assistive device: 1 person hand held assist Gait Pattern/deviations: Narrow base of support,Decreased stride length,Step-to pattern General Gait Details: deferred due to symptomatic orthostasis Gait velocity:  decr Gait velocity interpretation: <1.31 ft/sec, indicative of household ambulator    ADL: ADL Overall ADL's : Needs assistance/impaired Eating/Feeding: Moderate assistance,Sitting Eating/Feeding Details (indicate cue type and reason): Mod A for incorporating LUE into task as pt scooped ice chips out of cup. Grooming: Minimal assistance,Standing,Brushing hair Grooming Details (indicate cue type and reason): Pt brushing his hair at the sink with Min A for balance. Pt then becoming quiet and noting his face was pale color. Cuing pt to sit and taking BP; 80/50s Upper Body Bathing: Moderate assistance,Sitting Lower Body Bathing: Moderate assistance,Sit to/from stand Upper Body Dressing : Maximal assistance,Sitting Upper Body Dressing Details (indicate cue type and reason): to don new gown Lower Body Dressing: Maximal assistance,Sit to/from stand Lower Body Dressing Details (indicate cue type and reason): able to don R sock with min assist but requires mod assist for L sock; up to mod assist to maintain dynamic balance Toilet Transfer: Minimal assistance,+2 for physical assistance,+2 for safety/equipment,Ambulation Toilet Transfer Details (indicate cue type and reason): simulated in room Toileting- Clothing Manipulation and Hygiene: Total assistance,+2 for physical assistance,+2 for safety/equipment,Sit to/from stand Toileting - Clothing Manipulation Details (indicate cue type and reason): pt incontinent of bowel while sitting at sink, urinating through condom cath Functional mobility during ADLs: Minimal assistance,+2 for safety/equipment General ADL Comments: Pt performing grooming at sink; limited by othrostatics  Cognition: Cognition Overall Cognitive Status: Impaired/Different from baseline Arousal/Alertness: Lethargic Orientation Level: Oriented X4,Other (comment) (Occasionally confused to time) Cognition Arousal/Alertness: Awake/alert Behavior During Therapy: WFL for tasks  assessed/performed,Impulsive Overall Cognitive Status: Impaired/Different from baseline Area of Impairment: Attention,Memory,Following commands,Safety/judgement,Awareness,Problem solving Orientation Level: Disoriented to,Time,Situation Current Attention Level: Sustained Memory: Decreased recall of precautions,Decreased short-term memory Following  Commands: Follows one step commands consistently,Follows one step commands with increased time Safety/Judgement: Decreased awareness of safety,Decreased awareness of deficits Awareness: Intellectual Problem Solving: Slow processing,Decreased initiation,Difficulty sequencing,Requires verbal cues,Requires tactile cues General Comments: following simple commands with repetition. Easily distracted and needing multimodal cueing to redirect Difficult to assess due to: Impaired communication  Physical Exam: Blood pressure 129/70, pulse 93, temperature 97.7 F (36.5 C), temperature source Oral, resp. rate 19, height 5\' 9"  (1.753 m), weight 77.5 kg, SpO2 97 %. Physical Exam Vitals and nursing note reviewed.  Constitutional:      Comments: Pt sitting up- ate >90% of lunch, somewhat interactive, but sleepy, NAD  HENT:     Head: Normocephalic and atraumatic.     Comments: Severe L facial droop at rest- worse with smile; tongue L deviated    Right Ear: External ear normal.     Left Ear: External ear normal.     Nose: Nose normal. No congestion.     Mouth/Throat:     Mouth: Mucous membranes are dry.     Pharynx: Oropharynx is clear. No oropharyngeal exudate.  Eyes:     Comments: R gaze preference- can look L with a lot of prompting- but very slow; no nystagmgus seen  Cardiovascular:     Rate and Rhythm: Regular rhythm. Tachycardia present.     Heart sounds: Normal heart sounds. No murmur heard. No gallop.      Comments: Borderline tachycardia- low 100s Pulmonary:     Comments: CTA B/L- no W/R/R- good air movement Abdominal:     Comments: Soft, NT,  ND, (+)BS  - protuberant vs distended  Genitourinary:    Comments: Condom catheter in place- medium amber urine Musculoskeletal:     Cervical back: Normal range of motion. No rigidity.     Comments: RUE/RLE 5/5 LUE- Biceps 1/5, Triceps 3/5, WE/Grip and FA 0/5 LLE- HF 4/5, KE 4+/5, DF and PF 4+/5   Skin:    Comments: IV B/L forearms- look OK No skin breakdown seen  Neurological:     Comments: Patient is lethargic but arousable.  Makes eye contact with examiner.  Follows basic commands.  Speech is dysarthric.  He knows he is in the hospital and provides name of the hospital.  He would not state his name but would easily fall back asleep during questioning. Pt has significant L inattention and R gaze preference Intact to light touch in all 4 extremities B/L   Psychiatric:     Comments: Flat, frustrated, sleepy     Results for orders placed or performed during the hospital encounter of 01/24/21 (from the past 48 hour(s))  Glucose, capillary     Status: Abnormal   Collection Time: 02/13/21 12:15 PM  Result Value Ref Range   Glucose-Capillary 103 (H) 70 - 99 mg/dL    Comment: Glucose reference range applies only to samples taken after fasting for at least 8 hours.  Glucose, capillary     Status: Abnormal   Collection Time: 02/13/21  3:49 PM  Result Value Ref Range   Glucose-Capillary 120 (H) 70 - 99 mg/dL    Comment: Glucose reference range applies only to samples taken after fasting for at least 8 hours.  Glucose, capillary     Status: Abnormal   Collection Time: 02/13/21  8:34 PM  Result Value Ref Range   Glucose-Capillary 117 (H) 70 - 99 mg/dL    Comment: Glucose reference range applies only to samples taken after fasting for at least 8 hours.  Glucose, capillary     Status: Abnormal   Collection Time: 02/14/21 12:39 AM  Result Value Ref Range   Glucose-Capillary 134 (H) 70 - 99 mg/dL    Comment: Glucose reference range applies only to samples taken after fasting for at least  8 hours.  CBC     Status: Abnormal   Collection Time: 02/14/21  2:56 AM  Result Value Ref Range   WBC 4.0 4.0 - 10.5 K/uL   RBC 4.03 (L) 4.22 - 5.81 MIL/uL   Hemoglobin 12.0 (L) 13.0 - 17.0 g/dL   HCT 24.0 (L) 97.3 - 53.2 %   MCV 92.3 80.0 - 100.0 fL   MCH 29.8 26.0 - 34.0 pg   MCHC 32.3 30.0 - 36.0 g/dL   RDW 99.2 42.6 - 83.4 %   Platelets 288 150 - 400 K/uL   nRBC 0.8 (H) 0.0 - 0.2 %    Comment: Performed at [facility], [address], [place], [place] [address]  Basic metabolic panel     Status: Abnormal   Collection Time: 02/14/21  2:56 AM  Result Value Ref Range   Sodium 135 135 - 145 mmol/L   Potassium 4.5 3.5 - 5.1 mmol/L   Chloride 102 98 - 111 mmol/L   CO2 25 22 - 32 mmol/L   Glucose, Bld 140 (H) 70 - 99 mg/dL    Comment: Glucose reference range applies only to samples taken after fasting for at least 8 hours.   BUN 17 8 - 23 mg/dL   Creatinine, Ser 2.97 0.61 - 1.24 mg/dL   Calcium 8.5 (L) 8.9 - 10.3 mg/dL   GFR, Estimated >98 >92 mL/min    Comment: (NOTE) Calculated using the CKD-EPI Creatinine Equation (2021)    Anion gap 8 5 - 15    Comment: Performed at [facility], [address] [place], [place] [address]  Glucose, capillary     Status: Abnormal   Collection Time: 02/14/21  4:05 AM  Result Value Ref Range   Glucose-Capillary 146 (H) 70 - 99 mg/dL    Comment: Glucose reference range applies only to samples taken after fasting for at least 8 hours.  Glucose, capillary     Status: Abnormal   Collection Time: 02/14/21  7:47 AM  Result Value Ref Range   Glucose-Capillary 138 (H) 70 - 99 mg/dL    Comment: Glucose reference range applies only to samples taken after fasting for at least 8 hours.  Glucose, capillary     Status: Abnormal   Collection Time: 02/14/21 11:27 AM  Result Value Ref Range   Glucose-Capillary 109 (H) 70 - 99 mg/dL    Comment: Glucose reference range applies only to samples taken after fasting for at least 8 hours.  Glucose,  capillary     Status: Abnormal   Collection Time: 02/14/21  4:23 PM  Result Value Ref Range   Glucose-Capillary 125 (H) 70 - 99 mg/dL    Comment: Glucose reference range applies only to samples taken after fasting for at least 8 hours.  Glucose, capillary     Status: Abnormal   Collection Time: 02/14/21  7:49 PM  Result Value Ref Range   Glucose-Capillary 151 (H) 70 - 99 mg/dL    Comment: Glucose reference range applies only to samples taken after fasting for at least 8 hours.   Comment 1 Notify RN    Comment 2 Document in Chart   Glucose, capillary     Status: Abnormal  Collection Time: 02/14/21 11:37 PM  Result Value Ref Range   Glucose-Capillary 116 (H) 70 - 99 mg/dL    Comment: Glucose reference range applies only to samples taken after fasting for at least 8 hours.   Comment 1 Notify RN    Comment 2 Document in Chart   Glucose, capillary     Status: Abnormal   Collection Time: 02/15/21  3:18 AM  Result Value Ref Range   Glucose-Capillary 104 (H) 70 - 99 mg/dL    Comment: Glucose reference range applies only to samples taken after fasting for at least 8 hours.   Comment 1 Notify RN    Comment 2 Document in Chart   CBC     Status: Abnormal   Collection Time: 02/15/21  4:13 AM  Result Value Ref Range   WBC 3.3 (L) 4.0 - 10.5 K/uL   RBC 3.81 (L) 4.22 - 5.81 MIL/uL   Hemoglobin 11.4 (L) 13.0 - 17.0 g/dL   HCT 40.9 (L) 81.1 - 91.4 %   MCV 93.2 80.0 - 100.0 fL   MCH 29.9 26.0 - 34.0 pg   MCHC 32.1 30.0 - 36.0 g/dL   RDW 78.2 (H) 95.6 - 21.3 %   Platelets 244 150 - 400 K/uL   nRBC 0.0 0.0 - 0.2 %    Comment: Performed at [facility], [address], [place], [place] [address]  Basic metabolic panel     Status: Abnormal   Collection Time: 02/15/21  4:13 AM  Result Value Ref Range   Sodium 134 (L) 135 - 145 mmol/L   Potassium 4.5 3.5 - 5.1 mmol/L   Chloride 102 98 - 111 mmol/L   CO2 26 22 - 32 mmol/L   Glucose, Bld 108 (H) 70 - 99 mg/dL    Comment: Glucose reference  range applies only to samples taken after fasting for at least 8 hours.   BUN 15 8 - 23 mg/dL   Creatinine, Ser 8.46 0.61 - 1.24 mg/dL   Calcium 8.3 (L) 8.9 - 10.3 mg/dL   GFR, Estimated >96 >29 mL/min    Comment: (NOTE) Calculated using the CKD-EPI Creatinine Equation (2021)    Anion gap 6 5 - 15    Comment: Performed at [facility], [address] [place], [place] [address]  Glucose, capillary     Status: Abnormal   Collection Time: 02/15/21  7:55 AM  Result Value Ref Range   Glucose-Capillary 111 (H) 70 - 99 mg/dL    Comment: Glucose reference range applies only to samples taken after fasting for at least 8 hours.   Comment 1 Notify RN    Comment 2 Document in Chart    DG Swallowing Func-Speech Pathology  Result Date: 02/14/2021 Objective Swallowing Evaluation: Type of Study: MBS-Modified Barium Swallow Study  Patient Details Name: Rob Mciver Scrivener MRN: [id number] Date of Birth: [birth year] Today's Date: 02/14/2021 Time: SLP Start Time (ACUTE ONLY): 1354 -SLP Stop Time (ACUTE ONLY): 1418 SLP Time Calculation (min) (ACUTE ONLY): 24.88 min Past Medical History: No past medical history on file. Past Surgical History: Past Surgical History: Procedure Laterality Date . IR ANGIO INTRA EXTRACRAN SEL COM CAROTID INNOMINATE UNI L MOD SED  01/25/2021 . IR CT HEAD LTD  01/25/2021 . IR CT HEAD LTD  01/25/2021 . IR PERCUTANEOUS ART THROMBECTOMY/INFUSION INTRACRANIAL INC DIAG ANGIO  01/25/2021 . RADIOLOGY WITH ANESTHESIA N/A 01/24/2021  Procedure: IR WITH ANESTHESIA;  Surgeon: Julieanne Cotton, MD;  Location: [facility] OR;  Service:  Radiology;  Laterality: N/A; HPI: Pt is a 65 y.o. male with no significant PMHx who presented via EMS as a code stroke for acute onset of left hemiplegia, left facial droop, dysarthria and left hemineglect. NIHSS was 18 on admission and tPA was given. 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 Pt s/p  s/p  bilateral common carotid arteriograms followed by right MCA/ACA/ICA thrombus retrieval with stent angioplasty 4/21. Pt was intubated for procedure. CT head 4/22: Large area of hypoattenuation at the site of known right MCA territory infarct. Small focus of parenchymal hyperdensity at the posterior aspect of the infarct site [place] indicate a small amount of petechial hemorrhage or contrast extravasation from procedure. Small amount of subarachnoid hyperdensity over the right frontal convexity, either subarachnoid blood or contrast taining. MBS 4/26: a moderate oral and moderate to severe pharyngeal dysphagia with sensory and motor deficits of neurogenic etiology.  Subjective: -- (alert) Assessment / Plan / Recommendation CHL IP CLINICAL IMPRESSIONS 02/14/2021 Clinical Impression Pt presents with improved oropharyngeal function compared to that noted during the last MBS on 01/30/21. Pt demonstrated oropharyngeal dysphagia characterized by reduced labial seal and stripping, weak manipulation, impaired bolus propulsion, reduced bolus cohesion, a pharyngeal delay, premature spillage to the valleculae with intermittent spillover to the pyriform sinuses, reduced anterior laryngeal movement, and reduced pharyngeal constriction. He demonstrated anterior spillage to the left, residue in the left lateral sulcus, difficulty with A-P transport, vallecular residue, pyriform sinuse residue, and posterior pharyngeal wall residue. He inermittently demonstrated incomplete epiglottic inversion which facilitated penetration (PAS 3) and occasional aspiration (PAS 7) of thin liquids and once with a dual consistency bolus (i.e., nectar thick liquids with masticated regular textures). Pt's cough was inconsistently delayed and variability in effectiveness was also noted. No functional benefit was noted with pt's independent use of a posterior head tilt to facilitate A-P transport of solids. Regular textures, a barium tablet, and dysphagia 3  solids were ultimately removed from the oral cavity after multiple unsuccessful attempts at A-P transport. Amount of pharyngeal residue increased with advancement of solids, but was improved with a liquid wash and independent secondary swallows. A dysphagia 2 diet with nectar thick liquids will be initiated at this time. SLP is hopeful that G-tube placement can be avoided if p.o. intake is tolerated and quantity is adequate. Pt's results and recommendations were discussed with Dr. Jerral Ralph and Ralene Muskrat, [facility]; it was agreed that pt's Cortrak will be removed, and that G-tube placement will reassessed on Monday. SLP will continue to follow pt for treatment. SLP Visit Diagnosis Dysphagia, oropharyngeal phase (R13.12) Attention and concentration deficit following -- Frontal lobe and executive function deficit following -- Impact on safety and function --   CHL IP TREATMENT RECOMMENDATION 02/14/2021 Treatment Recommendations Therapy as outlined in treatment plan below   Prognosis 02/14/2021 Prognosis for Safe Diet Advancement Good Barriers to Reach Goals Cognitive deficits;Severity of deficits Barriers/Prognosis Comment -- CHL IP DIET RECOMMENDATION 02/14/2021 SLP Diet Recommendations Dysphagia 2 (Fine chop) solids;Nectar thick liquid Liquid Administration via Cup;No straw Medication Administration Crushed with puree Compensations Slow rate;Small sips/bites;Multiple dry swallows after each bite/sip;Follow solids with liquid Postural Changes Seated upright at 90 degrees   CHL IP OTHER RECOMMENDATIONS 02/14/2021 Recommended Consults -- Oral Care Recommendations Oral care BID Other Recommendations Order thickener from pharmacy   CHL IP FOLLOW UP RECOMMENDATIONS 02/14/2021 Follow up Recommendations Inpatient Rehab   CHL IP FREQUENCY AND DURATION 02/14/2021 Speech Therapy Frequency (  ACUTE ONLY) min 2x/week Treatment Duration 2 weeks      CHL IP ORAL PHASE 02/14/2021 Oral Phase Impaired Oral - Pudding Teaspoon -- Oral - Pudding Cup  -- Oral - Honey Teaspoon -- Oral - Honey Cup Weak lingual manipulation;Lingual/palatal residue;Decreased bolus cohesion;Premature spillage;Reduced posterior propulsion Oral - Nectar Teaspoon -- Oral - Nectar Cup Weak lingual manipulation;Lingual/palatal residue;Decreased bolus cohesion;Premature spillage;Reduced posterior propulsion;Left anterior bolus loss Oral - Nectar Straw Weak lingual manipulation;Lingual/palatal residue;Decreased bolus cohesion;Premature spillage;Reduced posterior propulsion;Left anterior bolus loss Oral - Thin Teaspoon -- Oral - Thin Cup Weak lingual manipulation;Lingual/palatal residue;Decreased bolus cohesion;Premature spillage;Reduced posterior propulsion;Left anterior bolus loss Oral - Thin Straw Weak lingual manipulation;Lingual/palatal residue;Decreased bolus cohesion;Premature spillage;Reduced posterior propulsion;Left anterior bolus loss Oral - Puree Weak lingual manipulation;Lingual/palatal residue;Decreased bolus cohesion;Reduced posterior propulsion;Left anterior bolus loss Oral - Mech Soft Weak lingual manipulation;Lingual/palatal residue;Reduced posterior propulsion Oral - Regular Weak lingual manipulation;Lingual/palatal residue;Reduced posterior propulsion Oral - Multi-Consistency -- Oral - Pill Weak lingual manipulation;Lingual/palatal residue;Reduced posterior propulsion Oral Phase - Comment --  CHL IP PHARYNGEAL PHASE 02/14/2021 Pharyngeal Phase Impaired Pharyngeal- Pudding Teaspoon -- Pharyngeal -- Pharyngeal- Pudding Cup -- Pharyngeal -- Pharyngeal- Honey Teaspoon -- Pharyngeal -- Pharyngeal- Honey Cup Reduced epiglottic inversion;Reduced anterior laryngeal mobility;Pharyngeal residue - valleculae;Pharyngeal residue - pyriform Pharyngeal -- Pharyngeal- Nectar Teaspoon -- Pharyngeal -- Pharyngeal- Nectar Cup Reduced epiglottic inversion;Reduced anterior laryngeal mobility;Pharyngeal residue - valleculae;Pharyngeal residue - pyriform;Penetration/Aspiration during swallow  Pharyngeal Material enters airway, remains ABOVE vocal cords then ejected out Pharyngeal- Nectar Straw Reduced epiglottic inversion;Reduced anterior laryngeal mobility;Pharyngeal residue - valleculae;Pharyngeal residue - pyriform;Penetration/Aspiration during swallow Pharyngeal Material enters airway, remains ABOVE vocal cords and not ejected out;Material enters airway, passes BELOW cords and not ejected out despite cough attempt by patient Pharyngeal- Thin Teaspoon -- Pharyngeal -- Pharyngeal- Thin Cup Reduced epiglottic inversion;Reduced anterior laryngeal mobility;Pharyngeal residue - valleculae;Pharyngeal residue - pyriform;Penetration/Aspiration during swallow Pharyngeal Material enters airway, passes BELOW cords and not ejected out despite cough attempt by patient Pharyngeal- Thin Straw Reduced epiglottic inversion;Reduced anterior laryngeal mobility;Pharyngeal residue - valleculae;Pharyngeal residue - pyriform;Penetration/Aspiration during swallow Pharyngeal Material enters airway, passes BELOW cords and not ejected out despite cough attempt by patient Pharyngeal- Puree Reduced epiglottic inversion;Reduced anterior laryngeal mobility;Pharyngeal residue - valleculae;Pharyngeal residue - pyriform Pharyngeal -- Pharyngeal- Mechanical Soft -- Pharyngeal -- Pharyngeal- Regular -- Pharyngeal -- Pharyngeal- Multi-consistency -- Pharyngeal -- Pharyngeal- Pill -- Pharyngeal -- Pharyngeal Comment --  CHL IP CERVICAL ESOPHAGEAL PHASE 02/14/2021 Cervical Esophageal Phase WFL Pudding Teaspoon -- Pudding Cup -- Honey Teaspoon -- Honey Cup WFL Nectar Teaspoon -- Nectar Cup -- Nectar Straw -- Thin Teaspoon -- Thin Cup -- Thin Straw -- Puree -- Mechanical Soft -- Regular -- Multi-consistency -- Pill -- Cervical Esophageal Comment -- Scheryl Marten 02/14/2021, 3:47 PM                  Medical Problem List and Plan: 1.  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  Recommendations of 30-day event monitor as outpatient.  -patient Prows  shower  -ELOS/Goals: 18-21 Days- supervision to min A 2.  Antithrombotics: -DVT/anticoagulation: Subcutaneous heparin  -antiplatelet therapy: Aspirin 81 mg daily and Brilinta 60 mg twice daily 3. Pain Management: Tylenol as needed 4. Mood: Provide emotional support  -antipsychotic agents: N/A 5. Neuropsych: This patient is capable of making decisions on his own behalf. 6. Skin/Wound Care: Routine skin checks 7. Fluids/Electrolytes/Nutrition: Routine in and outs with follow-up  chemistries 8.  Hypertension.  Currently on no antihypertensive medications..  Monitor with increased mobility 9.  Dysphagia.  Cortrak out- Dysphagia #2 nectar thick liquids.  Follow-up speech therapy 10.  Hyperlipidemia.  Lipitor 11.  Urinary retention.  Discontinue Foley tube.  Flomax 0.4 mg daily.  Check PVR-  12.  Acute hypoxic respiratory failure due to COVID-19.  Patient did receive steroids/remdesivir/Acterma and remained on isolation through 02/14/2021 and discontinued per infectious disease. Was not vaccinated 13. Constipation>- pt complaining of constipation, however LBM yesterday? Suggest checking KUB if doesn't improve.    Mcarthur Rossetti Angiulli, PA-C 02/15/2021    I have personally performed a face to face diagnostic evaluation of this patient and formulated the key components of the plan.  Additionally, I have personally reviewed laboratory data, imaging studies, as well as relevant notes and concur with the physician assistant's documentation above.   The patient's status has not changed from the original H&P.  Any changes in documentation from the acute care chart have been noted above.

## 2021-02-15 NOTE — Plan of Care (Signed)

## 2021-02-15 NOTE — Progress Notes (Signed)
Inpatient Rehabilitation Medication Review by a Pharmacist  A complete drug regimen review was completed for this patient to identify any potential clinically significant medication issues.  Clinically significant medication issues were identified:  no  Check AMION for pharmacist assigned to patient if future medication questions/issues arise during this admission.  Pharmacist comments:   Time spent performing this drug regimen review (minutes):  10   Leonard [name] 02/15/2021 3:15 PM

## 2021-02-15 NOTE — Progress Notes (Signed)
Leonard Diones, RN  Rehab Admission Coordinator  Physical Medicine and Rehabilitation  PMR Pre-admission     Signed  Date of Service:  01/27/2021 3:18 PM      Related encounter: ED to Hosp-Admission (Discharged) from 01/24/2021 in [facility]       Signed          Show:Clear all [x] Manual[x] Template[x] Copied  Added by: [x] Lind Covert, Runell Gess, CCC-SLP[x] Leonard Diones, RN[x] Genella Mech, CCC-SLP   [] Hover for details  PMR Admission Coordinator Pre-Admission Assessment  Patient: Leonard [name] is an 65 y.o., male MRN: [id number] DOB: [birth year] Height: 5' 9"  (175.3 cm) Weight: 77.5 kg                                                                                                                                                  Insurance Information HMO: yes    PPO:      PCP:      IPA:      80/20:      OTHER:  PRIMARY: [employer]      Policy#: [id number]      Subscriber: patient CM Name: Leonard [name] with follow up by Joni Reining      Phone#: [phone] EXT [phone]     Fax#: [phone] Pre-Cert#: [id number] for 7 days from 02/15/21 to 02/21/21     Employer: Retired Benefits:  Phone #: [phone]     Name: [email or web address] Eff Date: 07/07/2020- still active Deductible: does not have one OOP Max: $3,900 ($290 met) CIR: $295/day co-pay with a max co-pay of $1,770/admission (6 days) SNF: 100% coverage Outpatient:  $10-$20/visit co-pay Home Health:  100% coverage DME: 80% coverage; 20% co-insurance Providers: in-network  SECONDARY:       Policy#:       Phone#:   Development worker, community:       Phone#:   The Engineer, petroleum" for patients in Inpatient Rehabilitation Facilities with attached "Privacy Act [place] Records" was provided and verbally reviewed with: Patient and Family  Emergency Contact Information         Contact Information    Name Relation Home Work Mobile   [name],Leonard Spouse    [phone]   [name], Leonard   [phone]     Current Medical History  Patient Admitting Diagnosis: acute right MCA infarct  History of Present Illness: A 65 year old right-handed male unremarkable past medical history. He lives with spouse independent prior to admission. He has a son that lives nearby. 1 level home 4 steps to entry. Presented 01/24/2021 with acute onset of left-sided weakness dysarthria and facial droop. Noted blood pressure severely elevated to 32/124. 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 Complete occlusion of the  right middle cerebral artery at its origin with no collateral flow in the right MCA territory. Patient underwent revascularization per interventional radiology. Follow-up MRI 01/25/2021 showed a large acute infarct right MCA territory involving the frontal operculum insula and basal ganglia. No hemorrhage or mass-effect. Punctate foci of acute ischemia in the right temporal lobe and right thalamus. MRA was unremarkable. Echocardiogram with ejection fraction of 60 to 65% no wall motion abnormalities. Currently maintained on aspirin for CVA prophylaxis as well as Brilinta. Hospital course complicated by dysphagia patient n.p.o. alternative means of nutritional support and now has been advanced to dysphagia #2 nectar thick liquid diet.. Cleviprex added for blood pressure control. Hospital course complicated by acute hypoxic respiratory failure due to COVID-19 pneumonia possible aspiration. Hypoxia resolved treated with steroids/remdesivir/Actemraand broad-spectrum antimicrobial therapy. Note he was unvaccinated. Context infectious disease no need for isolation from 02/14/2021 onwards. He did experience some urinary retention with a Foley tube inserted hand placed on Flomax.Therapy evaluations  completed due to patient's left-sided weakness dysphagia and dysarthria to be admitted for a comprehensive inpatient rehab program.  Complete NIHSS TOTAL: 11 Glasgow Coma Scale Score: 15  Past Medical History  History reviewed. No pertinent past medical history.  Family History  family history is not on file.  Prior Rehab/Hospitalizations:  Has the patient had prior rehab or hospitalizations prior to admission? No  Has the patient had major surgery during 100 days prior to admission? Yes  Current Medications   Current Facility-Administered Medications:  .  acetaminophen (TYLENOL) tablet 650 mg, 650 mg, Oral, Q4H PRN **OR** acetaminophen (TYLENOL) 160 MG/5ML solution 650 mg, 650 mg, Per Tube, Q4H PRN, 650 mg at 02/07/21 2116 **OR** acetaminophen (TYLENOL) suppository 650 mg, 650 mg, Rectal, Q4H PRN, Beulah Gandy A, NP, 650 mg at 02/04/21 0400 .  albuterol (VENTOLIN HFA) 108 (90 Base) MCG/ACT inhaler 2 puff, 2 puff, Inhalation, Q4H PRN, Cruzita Lederer, Costin M, MD .  ascorbic acid (VITAMIN C) tablet 500 mg, 500 mg, Oral, Daily, Ghimire, Henreitta Leber, MD, 500 mg at 02/15/21 0902 .  aspirin chewable tablet 81 mg, 81 mg, Oral, Daily, 81 mg at 02/15/21 0902 **OR** aspirin chewable tablet 81 mg, 81 mg, Per Tube, Daily, Beulah Gandy A, NP, 81 mg at 02/14/21 1108 .  atorvastatin (LIPITOR) tablet 80 mg, 80 mg, Oral, Daily, Ghimire, Henreitta Leber, MD, 80 mg at 02/15/21 0902 .  chlorhexidine (PERIDEX) 0.12 % solution 15 mL, 15 mL, Mouth Rinse, BID, Beulah Gandy A, NP, 15 mL at 02/15/21 0903 .  Chlorhexidine Gluconate Cloth 2 % PADS 6 each, 6 each, Topical, Daily, Beulah Gandy A, NP, 6 each at 02/15/21 0903 .  heparin injection 5,000 Units, 5,000 Units, Subcutaneous, Q8H, Elgergawy, Silver Huguenin, MD, 5,000 Units at 02/14/21 2158 .  labetalol (NORMODYNE) injection 5-20 mg, 5-20 mg, Intravenous, Q2H PRN, Beulah Gandy A, NP, 20 mg at 01/28/21 0606 .  MEDLINE mouth rinse, 15 mL, Mouth Rinse, q12n4p, Beulah Gandy A, NP, 15 mL at 02/14/21 1156 .  pantoprazole (PROTONIX) EC tablet 40 mg, 40 mg, Oral, Daily, Ghimire, Henreitta Leber, MD, 40 mg at 02/15/21 0902 .  senna-docusate (Senokot-S) tablet 1 tablet, 1 tablet, Oral, Daily, Ghimire, Henreitta Leber, MD, 1 tablet at 02/15/21 0902 .  tamsulosin (FLOMAX) capsule 0.4 mg, 0.4 mg, Oral, Daily, Ghimire, Henreitta Leber, MD .  ticagrelor (BRILINTA) tablet 90 mg, 90 mg, Oral, BID, 90 mg at 02/15/21 0902 **OR** ticagrelor (BRILINTA) tablet 90 mg, 90 mg, Per Tube, BID, August Albino, NP, 90  mg at 02/14/21 1108 .  zinc sulfate capsule 220 mg, 220 mg, Oral, Daily, Ghimire, Henreitta Leber, MD, 220 mg at 02/15/21 0240  Patients Current Diet:     Diet Order                  Diet - low sodium heart healthy            DIET DYS 2 Room service appropriate? Yes; Fluid consistency: Nectar Thick  Diet effective now                  Precautions / Restrictions Precautions Precautions: Fall Precaution Comments: L neglect Restrictions Weight Bearing Restrictions: No LUE Weight Bearing: Partial weight bearing   Has the patient had 2 or more falls or a fall with injury in the past year?No  Prior Activity Level Community (5-7x/wk): drives, goes places everyday  Prior Functional Level Prior Function Level of Independence: Independent Comments: Pt is retired but stays busy. Pt used to work as a Pension scheme manager.  Self Care: Did the patient need help bathing, dressing, using the toilet or eating?  Independent  Indoor Mobility: Did the patient need assistance with walking from room to room (with or without device)? Independent  Stairs: Did the patient need assistance with internal or external stairs (with or without device)? Independent  Functional Cognition: Did the patient need help planning regular tasks such as shopping or remembering to take medications? Independent  Home Assistive Devices / Equipment Home Assistive Devices/Equipment: None Home  Equipment: None  Prior Device Use: Indicate devices/aids used by the patient prior to current illness, exacerbation or injury? None of the above  Current Functional Level Cognition  Arousal/Alertness: Lethargic Overall Cognitive Status: Impaired/Different from baseline Difficult to assess due to: Impaired communication Current Attention Level: Sustained Orientation Level: Oriented X4,Other (comment) (Occasionally confused to time) Following Commands: Follows one step commands consistently,Follows one step commands with increased time Safety/Judgement: Decreased awareness of safety,Decreased awareness of deficits General Comments: following simple commands with repetition. Easily distracted and needing multimodal cueing to redirect    Extremity Assessment (includes Sensation/Coordination)  Upper Extremity Assessment: LUE deficits/detail LUE Deficits / Details: decreased strength, AROM, grasp, and attention to LUE. LUE Sensation: decreased proprioception LUE Coordination: decreased fine motor,decreased gross motor  Lower Extremity Assessment: Defer to PT evaluation LLE Deficits / Details: Weakness compared to R noted with functional mobility through decreased step length and foot clearance LLE Sensation: decreased proprioception (did not test formally, but pt appears to lack awareness of its position, needing assistance to attend to it and correct) LLE Coordination: decreased fine motor,decreased gross motor    ADLs  Overall ADL's : Needs assistance/impaired Eating/Feeding: Moderate assistance,Sitting Eating/Feeding Details (indicate cue type and reason): Mod A for incorporating LUE into task as pt scooped ice chips out of cup. Grooming: Minimal assistance,Standing,Brushing hair Grooming Details (indicate cue type and reason): Pt brushing his hair at the sink with Min A for balance. Pt then becoming quiet and noting his face was pale color. Cuing pt to sit and taking BP;  80/50s Upper Body Bathing: Moderate assistance,Sitting Lower Body Bathing: Moderate assistance,Sit to/from stand Upper Body Dressing : Maximal assistance,Sitting Upper Body Dressing Details (indicate cue type and reason): to don new gown Lower Body Dressing: Maximal assistance,Sit to/from stand Lower Body Dressing Details (indicate cue type and reason): able to don R sock with min assist but requires mod assist for L sock; up to mod assist to maintain dynamic balance Toilet Transfer: Minimal assistance,+2  for physical assistance,+2 for safety/equipment,Ambulation Toilet Transfer Details (indicate cue type and reason): simulated in room Toileting- Clothing Manipulation and Hygiene: Total assistance,+2 for physical assistance,+2 for safety/equipment,Sit to/from stand Toileting - Clothing Manipulation Details (indicate cue type and reason): pt incontinent of bowel while sitting at sink, urinating through condom cath Functional mobility during ADLs: Minimal assistance,+2 for safety/equipment General ADL Comments: Pt performing grooming at sink; limited by othrostatics    Mobility  Overal bed mobility: Needs Assistance Bed Mobility: Supine to Sit,Sit to Supine Rolling: Mod assist,+2 for physical assistance Sidelying to sit: Mod assist,+2 for physical assistance Supine to sit: Min assist,HOB elevated Sit to supine: Min assist General bed mobility comments: assist to bring L LE off bed and elevate trunk. Multimodal cueing required for sequencing as patient easily distracted    Transfers  Overall transfer level: Needs assistance Equipment used: 1 person hand held assist Transfers: Sit to/from Stand Sit to Stand: Min assist,+2 safety/equipment Stand pivot transfers: Mod assist General transfer comment: MinA for boost up into standing and steadying    Ambulation / Gait / Stairs / Wheelchair Mobility  Ambulation/Gait Ambulation/Gait assistance: +2 safety/equipment,Min assist Gait  Distance (Feet): 6 Feet Assistive device: 1 person hand held assist Gait Pattern/deviations: Narrow base of support,Decreased stride length,Step-to pattern General Gait Details: deferred due to symptomatic orthostasis Gait velocity: decr Gait velocity interpretation: <1.31 ft/sec, indicative of household ambulator    Posture / Balance Dynamic Sitting Balance Sitting balance - Comments: distant supervision for static sitting Balance Overall balance assessment: Needs assistance Sitting-balance support: Feet supported,No upper extremity supported Sitting balance-Leahy Scale: Fair Sitting balance - Comments: distant supervision for static sitting Postural control: Posterior lean Standing balance support: Single extremity supported,During functional activity Standing balance-Leahy Scale: Poor Standing balance comment: UE support and min guard for static standing. Standing limited by orthostatic    Special needs/care consideration Oxygen 3L nasal cannula, Skin Surgical incision: groin/anterior, proximal, right; Ecchymosis: penis/anterior, bilateral, Cortrak, Urethral catheter and Designated visitor Santiago Glad Burgio, wife     Previous Home Environment (from acute therapy documentation) Living Arrangements: Spouse/significant other  Lives With: Spouse Available Help at Discharge: Family,Available PRN/intermittently Type of Home: House Home Layout: One level Home Access: Stairs to enter Entrance Stairs-Rails: None (rail on left side in back of house) Entrance Stairs-Number of Steps: 4 steps in front; 5 steps in back Bathroom Shower/Tub: Chiropodist: Standard Bathroom Accessibility: Yes How Accessible: Accessible via walker [place]: No Additional Comments: Wife works at a Agricultural consultant. Adult son lives nearby.  Discharge Living Setting Plans for Discharge Living Setting: Patient's home Type of Home at Discharge: House Discharge Home Layout: One  level Discharge Home Access: Stairs to enter Entrance Stairs-Rails: None (rail on left side of stairs in back of house) Entrance Stairs-Number of Steps: 4 steps in front; 5 steps in back Discharge Bathroom Shower/Tub: Tub/shower unit Discharge Bathroom Toilet: Standard Discharge Bathroom Accessibility: Yes How Accessible: Accessible via walker Does the patient have any problems obtaining your medications?: No  Social/Family/Support Systems Anticipated Caregiver: Santiago Glad Eno, wife; Heath Lark Sadowski, son Anticipated Caregiver's Contact Information: Santiago Glad: [phone] Caregiver Availability: Intermittent (works during the daytime) Discharge Plan Discussed with Primary Caregiver: Yes Is Caregiver In Agreement with Plan?: Yes Does Caregiver/Family have Issues with Lodging/Transportation while Pt is in Rehab?: No  Goals Patient/Family Goal for Rehab: Supervision-Min A: PT/OT/ST Expected length of stay: 18-21 days Pt/Family Agrees to Admission and willing to participate: Yes Program Orientation Provided & Reviewed with Pt/Caregiver Including Roles  &  Responsibilities: Yes  Decrease burden of Care through IP rehab admission: NA  Possible need for SNF placement upon discharge:  Not anticipated  Patient Condition: This patient's medical and functional status has changed since the consult dated: 01/26/21 in which the Rehabilitation Physician determined and documented that the patient's condition is appropriate for intensive rehabilitative care in an inpatient rehabilitation facility. See "History of Present Illness" (above) for medical update. Functional changes are: Currently requiring min assist for transfers and min assist for ADLs. Patient's medical and functional status update has been discussed with the Rehabilitation physician and patient remains appropriate for inpatient rehabilitation. Will admit to inpatient rehab today.  Preadmission Screen Completed By:  Leonard Diones, RN, 02/15/2021  12:24 PM ______________________________________________________________________   Discussed status with Dr. Dagoberto Ligas on 02/15/21 at 1200 and received approval for admission today.  Admission Coordinator:  Leonard [name], time 1224/Date 02/15/21           Cosigned by: Courtney Heys, MD at 02/15/2021 12:26 PM    Revision History                                                      Note Details  Author Leonard Diones, RN File Time 02/15/2021 12:24 PM  Author Type Rehab Admission Coordinator Status Signed  Last Editor Leonard Diones, RN Service Physical Medicine and [place] # [id number] Admit Date 02/15/2021

## 2021-02-15 NOTE — H&P (Signed)
Physical Medicine and Rehabilitation Admission H&P        Chief Complaint  Patient presents with  . Code Stroke  : HPI: Leonard [name] is a 65 year old right-handed male unremarkable past medical history.  He lives with spouse independent prior to admission.  He has a son that lives nearby.  1 level home 4 steps to entry.  Presented 01/24/2021 with acute onset of left-sided weakness dysarthria and facial droop.  Noted blood pressure severely elevated to 32/124.  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  Patient underwent revascularization per interventional radiology.  Follow-up MRI 01/25/2021 showed a large acute infarct right MCA territory involving the frontal operculum insula and basal ganglia.  No hemorrhage or mass-effect.  Punctate foci of acute ischemia in the right temporal lobe and right thalamus.  MRA was unremarkable.  Echocardiogram with ejection fraction of 60 to 65% no wall motion abnormalities.  Currently maintained on aspirin for CVA prophylaxis as well as Brilinta.  Hospital course complicated by dysphagia patient n.p.o. alternative means of nutritional support and now has been advanced to dysphagia #2 nectar thick liquid diet..  Cleviprex added for blood pressure control.  Hospital course complicated by acute hypoxic respiratory failure due to COVID-19 pneumonia possible aspiration.  Hypoxia resolved treated with steroids/remdesivir/Actemra and broad-spectrum antimicrobial therapy.  Note he was unvaccinated.  Context infectious disease no need for isolation from 02/14/2021 onwards.  He did experience some urinary retention with a Foley tube inserted hand placed on Flomax.  Therapy evaluations completed due to  patient's left-sided weakness dysphagia and dysarthria was admitted for a comprehensive rehab program.   Pt reports he "hasn't pooped in at least 4 days"-but LBM documented 5/11 in early AM;  Was only medium and prior was 5/9- medium; ate 90% lunch Denies pain and spasms.  Using condom cath to void- voiding well.    Review of Systems  Constitutional: Negative for chills and fever.  HENT: Negative for hearing loss.   Eyes: Negative for blurred vision and double vision.  Respiratory: Negative for cough and shortness of breath.   Cardiovascular: Negative for chest pain, palpitations and leg swelling.  Gastrointestinal: Positive for constipation. Negative for heartburn, nausea and vomiting.  Genitourinary: Negative for dysuria, flank pain and hematuria.  Musculoskeletal: Positive for joint pain and myalgias.  Skin: Negative for rash.  Neurological: Positive for sensory change, speech change and weakness.  All other systems reviewed and are negative.   History reviewed. No pertinent past medical history.      Past Surgical History:  Procedure Laterality Date  . IR ANGIO INTRA EXTRACRAN SEL COM CAROTID INNOMINATE UNI L MOD SED   01/25/2021  . IR CT HEAD LTD   01/25/2021  . IR CT HEAD LTD   01/25/2021  . IR PERCUTANEOUS ART THROMBECTOMY/INFUSION INTRACRANIAL INC DIAG ANGIO   01/25/2021  . RADIOLOGY WITH ANESTHESIA N/A 01/24/2021    Procedure: IR WITH ANESTHESIA;  Surgeon: Julieanne Cotton, MD;  Location: [facility] OR;  Service: Radiology;  Laterality: N/A;    History reviewed. No pertinent family history. Social History:  reports that he has never smoked. He has never used smokeless tobacco. He reports that he does not drink alcohol and does not use  drugs. Allergies: No Known Allergies       Medications Prior to Admission  Medication Sig Dispense Refill  . acetaminophen (TYLENOL) 500 MG tablet Take 1,000 mg by mouth every 6 (six) hours as needed for mild pain.      Marland Kitchen. aspirin EC 81 MG tablet Take  81 mg by mouth daily. Swallow whole.          Drug Regimen Review Drug regimen was reviewed and remains appropriate with no significant issues identified   Home: Home Living Family/patient expects to be discharged to:: Inpatient rehab Living Arrangements: Spouse/significant other Available Help at Discharge: Family,Available PRN/intermittently Type of Home: House Home Access: Stairs to enter Entergy CorporationEntrance Stairs-Number of Steps: 4 steps in front; 5 steps in back Entrance Stairs-Rails: None (rail on left side in back of house) Home Layout: One level Bathroom Shower/Tub: Engineer, manufacturing systemsTub/shower unit Bathroom Toilet: Standard Bathroom Accessibility: Yes Home Equipment: None Additional Comments: Wife works at a Programme researcher, broadcasting/film/videocar dealership. Adult son lives nearby.  Lives With: Spouse   Functional History: Prior Function Level of Independence: Independent Comments: Pt is retired but stays busy. Pt used to work as a Teaching laboratory techniciancar mechanic.   Functional Status:  Mobility: Bed Mobility Overal bed mobility: Needs Assistance Bed Mobility: Supine to Sit,Sit to Supine Rolling: Mod assist,+2 for physical assistance Sidelying to sit: Mod assist,+2 for physical assistance Supine to sit: Min assist,HOB elevated Sit to supine: Min assist General bed mobility comments: assist to bring L LE off bed and elevate trunk. Multimodal cueing required for sequencing as patient easily distracted Transfers Overall transfer level: Needs assistance Equipment used: 1 person hand held assist Transfers: Sit to/from Stand Sit to Stand: Min assist,+2 safety/equipment Stand pivot transfers: Mod assist General transfer comment: MinA for boost up into standing and steadying Ambulation/Gait Ambulation/Gait assistance: +2 safety/equipment,Min assist Gait Distance (Feet): 6 Feet Assistive device: 1 person hand held assist Gait Pattern/deviations: Narrow base of support,Decreased stride length,Step-to pattern General Gait Details: deferred due to  symptomatic orthostasis Gait velocity: decr Gait velocity interpretation: <1.31 ft/sec, indicative of household ambulator   ADL: ADL Overall ADL's : Needs assistance/impaired Eating/Feeding: Moderate assistance,Sitting Eating/Feeding Details (indicate cue type and reason): Mod A for incorporating LUE into task as pt scooped ice chips out of cup. Grooming: Minimal assistance,Standing,Brushing hair Grooming Details (indicate cue type and reason): Pt brushing his hair at the sink with Min A for balance. Pt then becoming quiet and noting his face was pale color. Cuing pt to sit and taking BP; 80/50s Upper Body Bathing: Moderate assistance,Sitting Lower Body Bathing: Moderate assistance,Sit to/from stand Upper Body Dressing : Maximal assistance,Sitting Upper Body Dressing Details (indicate cue type and reason): to don new gown Lower Body Dressing: Maximal assistance,Sit to/from stand Lower Body Dressing Details (indicate cue type and reason): able to don R sock with min assist but requires mod assist for L sock; up to mod assist to maintain dynamic balance Toilet Transfer: Minimal assistance,+2 for physical assistance,+2 for safety/equipment,Ambulation Toilet Transfer Details (indicate cue type and reason): simulated in room Toileting- Clothing Manipulation and Hygiene: Total assistance,+2 for physical assistance,+2 for safety/equipment,Sit to/from stand Toileting - Clothing Manipulation Details (indicate cue type and reason): pt incontinent of bowel while sitting at sink, urinating through condom cath Functional mobility during ADLs: Minimal assistance,+2 for safety/equipment General ADL Comments: Pt performing grooming at sink; limited by othrostatics   Cognition: Cognition Overall Cognitive Status: Impaired/Different from baseline Arousal/Alertness: Lethargic Orientation Level: Oriented X4,Other (comment) (Occasionally confused to time) Cognition Arousal/Alertness: Awake/alert Behavior  During Therapy: WFL for tasks assessed/performed,Impulsive Overall Cognitive Status: Impaired/Different from baseline Area of Impairment: Attention,Memory,Following commands,Safety/judgement,Awareness,Problem solving Orientation Level: Disoriented to,Time,Situation Current Attention Level: Sustained Memory: Decreased recall of precautions,Decreased short-term memory Following Commands: Follows one step commands consistently,Follows one step commands with increased time Safety/Judgement: Decreased awareness of safety,Decreased awareness of deficits Awareness: Intellectual Problem Solving: Slow processing,Decreased initiation,Difficulty sequencing,Requires verbal cues,Requires tactile cues General Comments: following simple commands with repetition. Easily distracted and needing multimodal cueing to redirect Difficult to assess due to: Impaired communication   Physical Exam: Blood pressure 129/70, pulse 93, temperature 97.7 F (36.5 C), temperature source Oral, resp. rate 19, height  (1.753 m), weight 77.5 kg, SpO2 97 %. Physical Exam Vitals and nursing note reviewed.  Constitutional:      Comments: Pt sitting up- ate >90% of lunch, somewhat interactive, but sleepy, NAD  HENT:     Head: Normocephalic and atraumatic.     Comments: Severe L facial droop at rest- worse with smile; tongue L deviated    Right Ear: External ear normal.     Left Ear: External ear normal.     Nose: Nose normal. No congestion.     Mouth/Throat:     Mouth: Mucous membranes are dry.     Pharynx: Oropharynx is clear. No oropharyngeal exudate.  Eyes:     Comments: R gaze preference- can look L with a lot of prompting- but very slow; no nystagmgus seen  Cardiovascular:     Rate and Rhythm: Regular rhythm. Tachycardia present.     Heart sounds: Normal heart sounds. No murmur heard. No gallop.      Comments: Borderline tachycardia- low 100s Pulmonary:     Comments: CTA B/L- no W/R/R- good air  movement Abdominal:     Comments: Soft, NT, ND, (+)BS  - protuberant vs distended  Genitourinary:    Comments: Condom catheter in place- medium amber urine Musculoskeletal:     Cervical back: Normal range of motion. No rigidity.     Comments: RUE/RLE 5/5 LUE- Biceps 1/5, Triceps 3/5, WE/Grip and FA 0/5 LLE- HF 4/5, KE 4+/5, DF and PF 4+/5   Skin:    Comments: IV B/L forearms- look OK No skin breakdown seen  Neurological:     Comments: Patient is lethargic but arousable.  Makes eye contact with examiner.  Follows basic commands.  Speech is dysarthric.  He knows he is in the hospital and provides name of the hospital.  He would not state his name but would easily fall back asleep during questioning. Pt has significant L inattention and R gaze preference Intact to light touch in all 4 extremities B/L   Psychiatric:     Comments: Flat, frustrated, sleepy        Lab Results Last 48 Hours        Results for orders placed or performed during the hospital encounter of 01/24/21 (from the past 48 hour(s))  Glucose, capillary     Status: Abnormal    Collection Time: 02/13/21 12:15 PM  Result Value Ref Range    Glucose-Capillary 103 (H) 70 - 99 mg/dL      Comment: Glucose reference range applies only to samples taken after fasting for at least 8 hours.  Glucose, capillary     Status: Abnormal    Collection Time: 02/13/21  3:49 PM  Result Value Ref Range    Glucose-Capillary 120 (H) 70 - 99 mg/dL      Comment: Glucose reference range applies only to samples  taken after fasting for at least 8 hours.  Glucose, capillary     Status: Abnormal    Collection Time: 02/13/21  8:34 PM  Result Value Ref Range    Glucose-Capillary 117 (H) 70 - 99 mg/dL      Comment: Glucose reference range applies only to samples taken after fasting for at least 8 hours.  Glucose, capillary     Status: Abnormal    Collection Time: 02/14/21 12:39 AM  Result Value Ref Range    Glucose-Capillary 134 (H) 70 - 99  mg/dL      Comment: Glucose reference range applies only to samples taken after fasting for at least 8 hours.  CBC     Status: Abnormal    Collection Time: 02/14/21  2:56 AM  Result Value Ref Range    WBC 4.0 4.0 - 10.5 K/uL    RBC 4.03 (L) 4.22 - 5.81 MIL/uL    Hemoglobin 12.0 (L) 13.0 - 17.0 g/dL    HCT 69.6 (L) 78.9 - 52.0 %    MCV 92.3 80.0 - 100.0 fL    MCH 29.8 26.0 - 34.0 pg    MCHC 32.3 30.0 - 36.0 g/dL    RDW 38.1 01.7 - 51.0 %    Platelets 288 150 - 400 K/uL    nRBC 0.8 (H) 0.0 - 0.2 %      Comment: Performed at [facility], [address], [place], [place] [address]  Basic metabolic panel     Status: Abnormal    Collection Time: 02/14/21  2:56 AM  Result Value Ref Range    Sodium 135 135 - 145 mmol/L    Potassium 4.5 3.5 - 5.1 mmol/L    Chloride 102 98 - 111 mmol/L    CO2 25 22 - 32 mmol/L    Glucose, Bld 140 (H) 70 - 99 mg/dL      Comment: Glucose reference range applies only to samples taken after fasting for at least 8 hours.    BUN 17 8 - 23 mg/dL    Creatinine, Ser 7.78 0.61 - 1.24 mg/dL    Calcium 8.5 (L) 8.9 - 10.3 mg/dL    GFR, Estimated >24 >23 mL/min      Comment: (NOTE) Calculated using the CKD-EPI Creatinine Equation (2021)      Anion gap 8 5 - 15      Comment: Performed at [facility], [address], [place], [place] [address]  Glucose, capillary     Status: Abnormal    Collection Time: 02/14/21  4:05 AM  Result Value Ref Range    Glucose-Capillary 146 (H) 70 - 99 mg/dL      Comment: Glucose reference range applies only to samples taken after fasting for at least 8 hours.  Glucose, capillary     Status: Abnormal    Collection Time: 02/14/21  7:47 AM  Result Value Ref Range    Glucose-Capillary 138 (H) 70 - 99 mg/dL      Comment: Glucose reference range applies only to samples taken after fasting for at least 8 hours.  Glucose, capillary     Status: Abnormal    Collection Time: 02/14/21 11:27 AM  Result Value Ref Range     Glucose-Capillary 109 (H) 70 - 99 mg/dL      Comment: Glucose reference range applies only to samples taken after fasting for at least 8 hours.  Glucose, capillary     Status: Abnormal    Collection Time:  02/14/21  4:23 PM  Result Value Ref Range    Glucose-Capillary 125 (H) 70 - 99 mg/dL      Comment: Glucose reference range applies only to samples taken after fasting for at least 8 hours.  Glucose, capillary     Status: Abnormal    Collection Time: 02/14/21  7:49 PM  Result Value Ref Range    Glucose-Capillary 151 (H) 70 - 99 mg/dL      Comment: Glucose reference range applies only to samples taken after fasting for at least 8 hours.    Comment 1 Notify RN      Comment 2 Document in Chart    Glucose, capillary     Status: Abnormal    Collection Time: 02/14/21 11:37 PM  Result Value Ref Range    Glucose-Capillary 116 (H) 70 - 99 mg/dL      Comment: Glucose reference range applies only to samples taken after fasting for at least 8 hours.    Comment 1 Notify RN      Comment 2 Document in Chart    Glucose, capillary     Status: Abnormal    Collection Time: 02/15/21  3:18 AM  Result Value Ref Range    Glucose-Capillary 104 (H) 70 - 99 mg/dL      Comment: Glucose reference range applies only to samples taken after fasting for at least 8 hours.    Comment 1 Notify RN      Comment 2 Document in Chart    CBC     Status: Abnormal    Collection Time: 02/15/21  4:13 AM  Result Value Ref Range    WBC 3.3 (L) 4.0 - 10.5 K/uL    RBC 3.81 (L) 4.22 - 5.81 MIL/uL    Hemoglobin 11.4 (L) 13.0 - 17.0 g/dL    HCT 16.1 (L) 09.6 - 52.0 %    MCV 93.2 80.0 - 100.0 fL    MCH 29.9 26.0 - 34.0 pg    MCHC 32.1 30.0 - 36.0 g/dL    RDW 04.5 (H) 40.9 - 15.5 %    Platelets 244 150 - 400 K/uL    nRBC 0.0 0.0 - 0.2 %      Comment: Performed at [facility], [address] [place], [place] [address]  Basic metabolic panel     Status: Abnormal    Collection Time: 02/15/21  4:13 AM  Result Value Ref  Range    Sodium 134 (L) 135 - 145 mmol/L    Potassium 4.5 3.5 - 5.1 mmol/L    Chloride 102 98 - 111 mmol/L    CO2 26 22 - 32 mmol/L    Glucose, Bld 108 (H) 70 - 99 mg/dL      Comment: Glucose reference range applies only to samples taken after fasting for at least 8 hours.    BUN 15 8 - 23 mg/dL    Creatinine, Ser 4.78 0.61 - 1.24 mg/dL    Calcium 8.3 (L) 8.9 - 10.3 mg/dL    GFR, Estimated >29 >56 mL/min      Comment: (NOTE) Calculated using the CKD-EPI Creatinine Equation (2021)      Anion gap 6 5 - 15      Comment: Performed at [facility], [address], [place], [place] [address]  Glucose, capillary     Status: Abnormal    Collection Time: 02/15/21  7:55 AM  Result Value Ref Range    Glucose-Capillary 111 (H) 70 -  99 mg/dL      Comment: Glucose reference range applies only to samples taken after fasting for at least 8 hours.    Comment 1 Notify RN      Comment 2 Document in Chart         Imaging Results (Last 48 hours)  DG Swallowing Func-Speech Pathology   Result Date: 02/14/2021 Objective Swallowing Evaluation: Type of Study: MBS-Modified Barium Swallow Study  Patient Details Name: Zephyr Ridley Sayres MRN: [id number] Date of Birth: 01-28-1956 Today's Date: 02/14/2021 Time: SLP Start Time (ACUTE ONLY): 1354 -SLP Stop Time (ACUTE ONLY): 1418 SLP Time Calculation (min) (ACUTE ONLY): 24.88 min Past Medical History: No past medical history on file. Past Surgical History: Past Surgical History: Procedure Laterality Date . IR ANGIO INTRA EXTRACRAN SEL COM CAROTID INNOMINATE UNI L MOD SED  01/25/2021 . IR CT HEAD LTD  01/25/2021 . IR CT HEAD LTD  01/25/2021 . IR PERCUTANEOUS ART THROMBECTOMY/INFUSION INTRACRANIAL INC DIAG ANGIO  01/25/2021 . RADIOLOGY WITH ANESTHESIA N/A 01/24/2021  Procedure: IR WITH ANESTHESIA;  Surgeon: Julieanne Cotton, MD;  Location: [facility] OR;  Service: Radiology;  Laterality: N/A; HPI: Pt is a 65 y.o. male with no significant PMHx who presented via EMS as a code stroke for  acute onset of left hemiplegia, left facial droop, dysarthria and left hemineglect. NIHSS was 18 on admission and tPA was given. 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 Pt s/p  s/p bilateral common carotid arteriograms followed by right MCA/ACA/ICA thrombus retrieval with stent angioplasty 4/21. Pt was intubated for procedure. CT head 4/22: Large area of hypoattenuation at the site of known right MCA territory infarct. Small focus of parenchymal hyperdensity at the posterior aspect of the infarct site Heberlein indicate a small amount of petechial hemorrhage or contrast extravasation from procedure. Small amount of subarachnoid hyperdensity over the right frontal convexity, either subarachnoid blood or contrast taining. MBS 4/26: a moderate oral and moderate to severe pharyngeal dysphagia with sensory and motor deficits of neurogenic etiology.  Subjective: -- (alert) Assessment / Plan / Recommendation CHL IP CLINICAL IMPRESSIONS 02/14/2021 Clinical Impression Pt presents with improved oropharyngeal function compared to that noted during the last MBS on 01/30/21. Pt demonstrated oropharyngeal dysphagia characterized by reduced labial seal and stripping, weak manipulation, impaired bolus propulsion, reduced bolus cohesion, a pharyngeal delay, premature spillage to the valleculae with intermittent spillover to the pyriform sinuses, reduced anterior laryngeal movement, and reduced pharyngeal constriction. He demonstrated anterior spillage to the left, residue in the left lateral sulcus, difficulty with A-P transport, vallecular residue, pyriform sinuse residue, and posterior pharyngeal wall residue. He inermittently demonstrated incomplete epiglottic inversion which facilitated penetration (PAS 3) and occasional aspiration (PAS 7) of thin liquids and once with a dual consistency bolus (i.e., nectar thick liquids with masticated regular textures).  Pt's cough was inconsistently delayed and variability in effectiveness was also noted. No functional benefit was noted with pt's independent use of a posterior head tilt to facilitate A-P transport of solids. Regular textures, a barium tablet, and dysphagia 3 solids were ultimately removed from the oral cavity after multiple unsuccessful attempts at A-P transport. Amount of pharyngeal residue increased with advancement of solids, but was improved with a liquid wash and independent secondary swallows. A dysphagia 2 diet with nectar thick liquids will be initiated at this time. SLP is hopeful that G-tube placement can be avoided if p.o. intake is tolerated and quantity is adequate. Pt's results  and recommendations were discussed with Dr. Jerral Ralph and Ralene Muskrat, [facility]; it was agreed that pt's Cortrak will be removed, and that G-tube placement will reassessed on Monday. SLP will continue to follow pt for treatment. SLP Visit Diagnosis Dysphagia, oropharyngeal phase (R13.12) Attention and concentration deficit following -- Frontal lobe and executive function deficit following -- Impact on safety and function --   CHL IP TREATMENT RECOMMENDATION 02/14/2021 Treatment Recommendations Therapy as outlined in treatment plan below   Prognosis 02/14/2021 Prognosis for Safe Diet Advancement Good Barriers to Reach Goals Cognitive deficits;Severity of deficits Barriers/Prognosis Comment -- CHL IP DIET RECOMMENDATION 02/14/2021 SLP Diet Recommendations Dysphagia 2 (Fine chop) solids;Nectar thick liquid Liquid Administration via Cup;No straw Medication Administration Crushed with puree Compensations Slow rate;Small sips/bites;Multiple dry swallows after each bite/sip;Follow solids with liquid Postural Changes Seated upright at 90 degrees   CHL IP OTHER RECOMMENDATIONS 02/14/2021 Recommended Consults -- Oral Care Recommendations Oral care BID Other Recommendations Order thickener from pharmacy   CHL IP FOLLOW UP RECOMMENDATIONS 02/14/2021  Follow up Recommendations Inpatient Rehab   CHL IP FREQUENCY AND DURATION 02/14/2021 Speech Therapy Frequency (ACUTE ONLY) min 2x/week Treatment Duration 2 weeks      CHL IP ORAL PHASE 02/14/2021 Oral Phase Impaired Oral - Pudding Teaspoon -- Oral - Pudding Cup -- Oral - Honey Teaspoon -- Oral - Honey Cup Weak lingual manipulation;Lingual/palatal residue;Decreased bolus cohesion;Premature spillage;Reduced posterior propulsion Oral - Nectar Teaspoon -- Oral - Nectar Cup Weak lingual manipulation;Lingual/palatal residue;Decreased bolus cohesion;Premature spillage;Reduced posterior propulsion;Left anterior bolus loss Oral - Nectar Straw Weak lingual manipulation;Lingual/palatal residue;Decreased bolus cohesion;Premature spillage;Reduced posterior propulsion;Left anterior bolus loss Oral - Thin Teaspoon -- Oral - Thin Cup Weak lingual manipulation;Lingual/palatal residue;Decreased bolus cohesion;Premature spillage;Reduced posterior propulsion;Left anterior bolus loss Oral - Thin Straw Weak lingual manipulation;Lingual/palatal residue;Decreased bolus cohesion;Premature spillage;Reduced posterior propulsion;Left anterior bolus loss Oral - Puree Weak lingual manipulation;Lingual/palatal residue;Decreased bolus cohesion;Reduced posterior propulsion;Left anterior bolus loss Oral - Mech Soft Weak lingual manipulation;Lingual/palatal residue;Reduced posterior propulsion Oral - Regular Weak lingual manipulation;Lingual/palatal residue;Reduced posterior propulsion Oral - Multi-Consistency -- Oral - Pill Weak lingual manipulation;Lingual/palatal residue;Reduced posterior propulsion Oral Phase - Comment --  CHL IP PHARYNGEAL PHASE 02/14/2021 Pharyngeal Phase Impaired Pharyngeal- Pudding Teaspoon -- Pharyngeal -- Pharyngeal- Pudding Cup -- Pharyngeal -- Pharyngeal- Honey Teaspoon -- Pharyngeal -- Pharyngeal- Honey Cup Reduced epiglottic inversion;Reduced anterior laryngeal mobility;Pharyngeal residue - valleculae;Pharyngeal residue -  pyriform Pharyngeal -- Pharyngeal- Nectar Teaspoon -- Pharyngeal -- Pharyngeal- Nectar Cup Reduced epiglottic inversion;Reduced anterior laryngeal mobility;Pharyngeal residue - valleculae;Pharyngeal residue - pyriform;Penetration/Aspiration during swallow Pharyngeal Material enters airway, remains ABOVE vocal cords then ejected out Pharyngeal- Nectar Straw Reduced epiglottic inversion;Reduced anterior laryngeal mobility;Pharyngeal residue - valleculae;Pharyngeal residue - pyriform;Penetration/Aspiration during swallow Pharyngeal Material enters airway, remains ABOVE vocal cords and not ejected out;Material enters airway, passes BELOW cords and not ejected out despite cough attempt by patient Pharyngeal- Thin Teaspoon -- Pharyngeal -- Pharyngeal- Thin Cup Reduced epiglottic inversion;Reduced anterior laryngeal mobility;Pharyngeal residue - valleculae;Pharyngeal residue - pyriform;Penetration/Aspiration during swallow Pharyngeal Material enters airway, passes BELOW cords and not ejected out despite cough attempt by patient Pharyngeal- Thin Straw Reduced epiglottic inversion;Reduced anterior laryngeal mobility;Pharyngeal residue - valleculae;Pharyngeal residue - pyriform;Penetration/Aspiration during swallow Pharyngeal Material enters airway, passes BELOW cords and not ejected out despite cough attempt by patient Pharyngeal- Puree Reduced epiglottic inversion;Reduced anterior laryngeal mobility;Pharyngeal residue - valleculae;Pharyngeal residue - pyriform Pharyngeal -- Pharyngeal- Mechanical Soft -- Pharyngeal -- Pharyngeal- Regular -- Pharyngeal -- Pharyngeal- Multi-consistency -- Pharyngeal -- Pharyngeal- Pill -- Pharyngeal -- Pharyngeal Comment --  CHL IP CERVICAL ESOPHAGEAL PHASE 02/14/2021 Cervical Esophageal Phase WFL Pudding Teaspoon -- Pudding Cup -- Honey Teaspoon -- Honey Cup WFL Nectar Teaspoon -- Nectar Cup -- Nectar Straw -- Thin Teaspoon -- Thin Cup -- Thin Straw -- Puree -- Mechanical Soft -- Regular --  Multi-consistency -- Pill -- Cervical Esophageal Comment -- Scheryl Marten 02/14/2021, 3:47 PM                        Medical Problem List and Plan: 1.  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  Recommendations of 30-day event monitor as outpatient.             -patient Kirsch  shower             -ELOS/Goals: 18-21 Days- supervision to min A 2.  Antithrombotics: -DVT/anticoagulation: Subcutaneous heparin             -antiplatelet therapy: Aspirin 81 mg daily and Brilinta 60 mg twice daily 3. Pain Management: Tylenol as needed 4. Mood: Provide emotional support             -antipsychotic agents: N/A 5. Neuropsych: This patient is capable of making decisions on his own behalf. 6. Skin/Wound Care: Routine skin checks 7. Fluids/Electrolytes/Nutrition: Routine in and outs with follow-up chemistries 8.  Hypertension.  Currently on no antihypertensive medications..  Monitor with increased mobility 9.  Dysphagia.  Cortrak out- Dysphagia #2 nectar thick liquids.  Follow-up speech therapy 10.  Hyperlipidemia.  Lipitor 11.  Urinary retention.  Discontinue Foley tube.  Flomax 0.4 mg daily.  Check PVR-  12.  Acute hypoxic respiratory failure due to COVID-19.  Patient did receive steroids/remdesivir/Acterma and remained on isolation through 02/14/2021 and discontinued per infectious disease. Was not vaccinated 13. Constipation>- pt complaining of constipation, however LBM yesterday? Suggest checking KUB if doesn't improve.      Mcarthur Rossetti Angiulli, PA-C 02/15/2021      I have personally performed a face to face diagnostic evaluation of this patient and formulated the key components of the plan.  Additionally, I have personally reviewed laboratory data, imaging studies, as well as relevant notes and concur with the physician assistant's documentation above.   The patient's  status has not changed from the original H&P.  Any changes in documentation from the acute care chart have been noted above.

## 2021-02-15 NOTE — Progress Notes (Addendum)
IP rehab admissions - I have approval from insurance carrier for acute inpatient rehab admission.  I have medical clearance from attending MD.  Bed available and will admit to CIR today.  Call for questions.  [phone]  I did call patient's wife and she is agreeable and pleased with patient coming to CIR.

## 2021-02-15 NOTE — Discharge Summary (Signed)
PATIENT DETAILS Name: Leonard [name] Age: 65 y.o. Sex: male Date of Birth: 31-Oct-1955 MRN: [id number]. Admitting Physician: Caryl Pina, MD PCP:Pcp, No  Admit Date: 01/24/2021 Discharge date: 02/15/2021  Recommendations for Outpatient Follow-up:  1. Follow up with PCP in 1-2 weeks 2. Please obtain CMP/CBC in one week 3. Please ensure follow-up with stroke clinic, and interventional radiology. 4. Please consider outpatient referral to cardiology for 30-day event monitor. 5. Please attempt a voiding trial in the next few days to see if Foley catheter can be discontinued.  Admitted From:  Home  Disposition: CIR    Home Health: Yes  Equipment/Devices: None  Discharge Condition: Stable  CODE STATUS: FULL CODE  Diet recommendation:  Diet Order            Diet - low sodium heart healthy           DIET DYS 2 Room service appropriate? Yes; Fluid consistency: Nectar Thick  Diet effective now                  Brief Summary: Patient is a 65 y.o. male with no past medical history-who presented with left-sided weakness-found to have acute right MCA territory infarct-s/p tPA-followed by right MCA/ACA/ICA thrombus retrieval/stent angioplasty-initially admitted to the neuro ICU-[name] course complicated by development of dysphagia requiring tube feedings-and COVID-19 infection.  See below for further details  Brief [name] Course: Acute hypoxic respiratory failure due to COVID-19 pneumonia and possible aspiration: Hypoxia has resolved-treated with steroids/remdesivir/Actemra and broad-spectrum antimicrobial therapy.  Note-he is unvaccinated.  Prior MD-Dr. Randol Kern spoke with infectious disease-no need for isolation from 5/11 onwards.  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  Neurology recommending 30-day event monitor as an outpatient to rule  out A. fib, and to continue aspirin/Brilinta.  Continues to have mostly left arm weakness.  Please ensure outpatient follow-up with stroke clinic in with interventional radiology.  Acute metabolic encephalopathy: Due to CVA/hypoxia/COVID-19 infection-improved  Dysphagia: Due to above-initially it was thought that patient Guidroz require PEG tube-however on 5/11-upon SLP evaluation-patient was felt to be markedly improved.  He was started on a dysphagia 2 diet that he seems to be tolerating well.  Please continue SLP follow-up while at CIR.  HTN: BP stable-on Flomax/doxazosin-add other agents accordingly.  HLD: Continue statin  Acute urinary retention:  Will switch to Flomax on 5/12-previously on doxazosin-we will need to attempt a voiding trial in the next few days while at CIR.     Discharge Diagnoses:  Active Problems:   Stroke (cerebrum) (HCC)   Middle cerebral artery embolism, right   Hypertension   Tachypnea   Leukocytosis   Acute blood loss anemia   Dysphagia, post-stroke   Discharge Instructions:  Activity:  As tolerated with Full fall precautions use walker/cane & assistance as needed  Discharge Instructions    Ambulatory referral to Neurology   Complete by: As directed    Follow up with Dr. Pearlean Brownie at [name] in 4-6 weeks. Too complicated for NP to follow. Thanks.   Diet - low sodium heart healthy   Complete by: As directed    Increase activity slowly   Complete by: As directed    No wound care   Complete by: As directed      Allergies as of 02/15/2021   No Known Allergies     Medication List    STOP taking these medications  acetaminophen 500 MG tablet Commonly known as: TYLENOL     TAKE these medications   aspirin EC 81 MG tablet Take 81 mg by mouth daily. Swallow whole.   atorvastatin 80 MG tablet Commonly known as: LIPITOR Take 1 tablet (80 mg total) by mouth daily. Start taking on: Heinzelman 13, 2022   pantoprazole 40 MG tablet Commonly known as:  PROTONIX Take 1 tablet (40 mg total) by mouth daily. Start taking on: Tino 13, 2022   tamsulosin 0.4 MG Caps capsule Commonly known as: FLOMAX Take 1 capsule (0.4 mg total) by mouth daily.   ticagrelor 90 MG Tabs tablet Commonly known as: BRILINTA Take 1 tablet (90 mg total) by mouth 2 (two) times daily.       Follow-up Information    Julieanne Cotton, MD Follow up in 3 month(s).   Specialties: Interventional Radiology, Radiology Why: Please follow-up with Dr. Corliss Skains for CTA head/neck followed by consultation 3 months after discharge. Our schedulers will call you to set up this appointment. Contact information: [address] [place] [address] [phone]        Micki Riley, MD. Schedule an appointment as soon as possible for a visit in 4 week(s).   Specialties: Neurology, Radiology Contact information: [address] [place] [address] [phone]        Primary care practitioner. Schedule an appointment as soon as possible for a visit in 2 week(s).              No Known Allergies   Consultations: Neurology, PCCM, IR  Other Procedures/Studies: CT ABDOMEN WO CONTRAST  Result Date: 02/10/2021 CLINICAL DATA:  Assess anatomy for possible gastrostomy tube placement. History of cerebrovascular accident. EXAM: CT ABDOMEN WITHOUT CONTRAST TECHNIQUE: Multidetector CT imaging of the abdomen was performed following the standard protocol without IV contrast. COMPARISON:  None. FINDINGS: Lower chest: Nonspecific patchy ground-glass attenuation airspace opacities in the visualized lower lungs bilaterally. Visualized cardiac structures are normal in size. No pericardial effusion. Unremarkable distal thoracic esophagus conveying a feeding tube. Hepatobiliary: No focal liver abnormality is seen. No gallstones, gallbladder wall thickening, or biliary dilatation. Pancreas: Unremarkable. No pancreatic ductal dilatation or surrounding inflammatory changes.  Spleen: Normal in size without focal abnormality. Adrenals/Urinary Tract: Adrenal glands are unremarkable. Kidneys are normal, without renal calculi, focal lesion, or hydronephrosis. Stomach/Bowel: Normal gastric anatomy. Transpyloric feeding tube tip is present in the proximal duodenum. No significant colonic interposition anterior to the stomach. Vascular/Lymphatic: Mild atherosclerotic calcification within the abdominal aorta. No suspicious lymphadenopathy. Other: No abdominal wall hernia or abnormality. Musculoskeletal: No acute fracture or aggressive appearing lytic or blastic osseous lesion. IMPRESSION: Anatomy is suitable for percutaneous gastrostomy tube placement. Electronically Signed   By: Malachy Moan M.D.   On: 02/10/2021 08:56   DG Abd 1 View  Result Date: 01/25/2021 CLINICAL DATA:  OG tube placement EXAM: ABDOMEN - 1 VIEW COMPARISON:  None. FINDINGS: Esophageal tube side-port at or slightly above GE junction, suggest further advancement by 5-10 cm for more optimal positioning. Nonobstructed gas pattern. IMPRESSION: Esophageal tube side-port at or slightly above the GE junction, suggest further advancement by 5-10 cm for more optimal positioning. These results will be called to the ordering clinician or representative by the Radiologist Assistant, and communication documented in the PACS or [employer]. Electronically Signed   By: Jasmine Pang M.D.   On: 01/25/2021 20:45   CT HEAD WO CONTRAST  Result Date: 02/07/2021 CLINICAL DATA:  History of right MCA infarct.  EXAM: CT HEAD WITHOUT CONTRAST TECHNIQUE: Contiguous axial images were obtained from the base of the skull through the vertex without intravenous contrast. COMPARISON:  Head CT scan 02/04/2021 and 01/27/2021. FINDINGS: Brain: Hypoattenuation in the right MCA territories consistent evolution of the patient's known infarct. No hemorrhagic transformation. No new infarct, midline shift, hydrocephalus, mass or abnormal extra-axial  fluid collection. Vascular: No hyperdense vessel or unexpected calcification. Skull: Intact.  No focal lesion. Sinuses/Orbits: NG tube is noted. Mild mucosal thickening sphenoid sinuses seen scratch the mild mucosal thickening left sphenoid sinus. Other: None. IMPRESSION: No acute intracranial abnormality. Continued evolution of the patient's known right MCA territory infarct. Electronically Signed   By: Drusilla Kanner M.D.   On: 02/07/2021 14:49   CT HEAD WO CONTRAST  Result Date: 02/04/2021 CLINICAL DATA:  Follow-up stroke. Right MCA territory stroke in late April. EXAM: CT HEAD WITHOUT CONTRAST TECHNIQUE: Contiguous axial images were obtained from the base of the skull through the vertex without intravenous contrast. COMPARISON:  01/27/2021 FINDINGS: Brain: No abnormality affects the brainstem or cerebellum. Left cerebral hemisphere again appears normal. Subacute infarction in the right MCA territory affecting the insula and frontal operculum. Less swelling. Small areas of hemorrhage within the stroke are becoming less dense. No evidence of new or increasing hemorrhage. Right-to-left shift now only 1 or 2 mm. No new ischemic insult. No hydrocephalus or extra-axial collection. Vascular: No abnormal vascular finding. Skull: Normal Sinuses/Orbits: Clear/normal. Other: None IMPRESSION: Expected evolutionary changes of infarction in the right middle cerebral artery territory which was acute in a parole. Low-density persists but with diminished swelling. Areas of hemorrhage within the substance of the infarction are becoming less dense. No new or increasing hemorrhage. Diminishing right to left midline shift, only about 2 mm presently. Electronically Signed   By: Paulina Fusi M.D.   On: 02/04/2021 16:01   CT HEAD WO CONTRAST  Result Date: 01/27/2021 CLINICAL DATA:  Stroke follow-up EXAM: CT HEAD WITHOUT CONTRAST TECHNIQUE: Contiguous axial images were obtained from the base of the skull through the vertex  without intravenous contrast. COMPARISON:  01/26/2021 FINDINGS: Brain: Continued evolution of right MCA territory infarct. No worsened midline shift. Unchanged multifocal intraparenchymal and subarachnoid blood. Vascular: No hyperdense vessel or unexpected calcification. Skull: Normal. Negative for fracture or focal lesion. Sinuses/Orbits: No acute finding. Other: None. IMPRESSION: Expected evolution of right MCA territory infarct with unchanged multifocal intraparenchymal and subarachnoid blood. No worsened midline shift. Electronically Signed   By: Deatra Robinson M.D.   On: 01/27/2021 03:17   CT HEAD WO CONTRAST  Result Date: 01/26/2021 CLINICAL DATA:  Stroke follow-up EXAM: CT HEAD WITHOUT CONTRAST TECHNIQUE: Contiguous axial images were obtained from the base of the skull through the vertex without intravenous contrast. COMPARISON:  Head CT 01/24/2021 FINDINGS: Brain: Large area of hypoattenuation at the site of known right MCA territory infarct. There is a focus of parenchymal hyperdensity at the posterior aspect of the infarct site. There is a small amount of subarachnoid hyperdensity over the right frontal convexity. Vascular: No hyperdense vessel or unexpected calcification. Skull: Normal. Negative for fracture or focal lesion. Sinuses/Orbits: No acute finding. Other: None. IMPRESSION: 1. Large area of hypoattenuation at the site of known right MCA territory infarct. Small focus of parenchymal hyperdensity at the posterior aspect of the infarct site Saulnier indicate a small amount of petechial hemorrhage or contrast extravasation from earlier procedure. 2. Small amount of subarachnoid hyperdensity over the right frontal convexity, either subarachnoid blood or contrast staining. Electronically Signed  By: Deatra Robinson M.D.   On: 01/26/2021 01:19   MR ANGIO HEAD WO CONTRAST  Result Date: 01/25/2021 CLINICAL DATA:  Left hemiplegia and left facial droop EXAM: MRI HEAD WITHOUT CONTRAST MRA HEAD WITHOUT  CONTRAST TECHNIQUE: Multiplanar, multiecho pulse sequences of the brain and surrounding structures were obtained without intravenous contrast. Angiographic images of the head were obtained using MRA technique without contrast. COMPARISON:  None. FINDINGS: MRI HEAD FINDINGS Brain: Large acute infarct of the right MCA territory involving the frontal operculum, insula and basal ganglia. There are punctate foci of acute ischemia in the right temporal lobe and right thalamus. Vascular: Major flow voids are preserved. Skull and upper cervical spine: Normal calvarium and skull base. Visualized upper cervical spine and soft tissues are normal. Sinuses/Orbits:No paranasal sinus fluid levels or advanced mucosal thickening. No mastoid or middle ear effusion. Normal orbits. MRA HEAD FINDINGS POSTERIOR CIRCULATION: --Vertebral arteries: Normal --Inferior cerebellar arteries: Normal. --Basilar artery: Normal. --Superior cerebellar arteries: Normal. --Posterior cerebral arteries: Normal. ANTERIOR CIRCULATION: --Intracranial internal carotid arteries: Normal. --Anterior cerebral arteries (ACA): Normal. --Middle cerebral arteries (MCA): Normal. ANATOMIC VARIANTS: None IMPRESSION: 1. 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 No hemorrhage or mass effect. 2. Punctate foci of acute ischemia in the right temporal lobe and right thalamus. 3. Normal intracranial MRA. Electronically Signed   By: Deatra Robinson M.D.   On: 01/25/2021 23:32   MR BRAIN WO CONTRAST  Result Date: 01/25/2021 CLINICAL DATA:  Left hemiplegia and left facial droop EXAM: MRI HEAD WITHOUT CONTRAST MRA HEAD WITHOUT CONTRAST TECHNIQUE: Multiplanar, multiecho pulse sequences of the brain and surrounding structures were obtained without intravenous contrast. Angiographic images of the head were obtained using MRA technique without contrast. COMPARISON:  None. FINDINGS: MRI HEAD FINDINGS Brain: Large acute  infarct of the right MCA territory involving the frontal operculum, insula and basal ganglia. There are punctate foci of acute ischemia in the right temporal lobe and right thalamus. Vascular: Major flow voids are preserved. Skull and upper cervical spine: Normal calvarium and skull base. Visualized upper cervical spine and soft tissues are normal. Sinuses/Orbits:No paranasal sinus fluid levels or advanced mucosal thickening. No mastoid or middle ear effusion. Normal orbits. MRA HEAD FINDINGS POSTERIOR CIRCULATION: --Vertebral arteries: Normal --Inferior cerebellar arteries: Normal. --Basilar artery: Normal. --Superior cerebellar arteries: Normal. --Posterior cerebral arteries: Normal. ANTERIOR CIRCULATION: --Intracranial internal carotid arteries: Normal. --Anterior cerebral arteries (ACA): Normal. --Middle cerebral arteries (MCA): Normal. ANATOMIC VARIANTS: None IMPRESSION: 1. 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 No hemorrhage or mass effect. 2. Punctate foci of acute ischemia in the right temporal lobe and right thalamus. 3. Normal intracranial MRA. Electronically Signed   By: Deatra Robinson M.D.   On: 01/25/2021 23:32   IR CT Head Ltd  Result Date: 01/29/2021 INDICATION: Left sided hemiplegia, right gaze deviation and neglect on left-side. Occluded right internal carotid artery proximally, and right middle cerebral artery and the right anterior cerebral artery. EXAM: 1. EMERGENT LARGE VESSEL OCCLUSION THROMBOLYSIS (anterior CIRCULATION) COMPARISON:  CT angiogram of the head and neck of January 24, 2021. MEDICATIONS: Ancef 2 g IV antibiotic was administered within 1 hour of the procedure. ANESTHESIA/SEDATION: General anesthesia. CONTRAST:  Isovue 300 approximately 160 mL. FLUOROSCOPY TIME:  Fluoroscopy Time: 96 minutes 18 seconds (3550 mGy). COMPLICATIONS: None immediate. TECHNIQUE: Following a full explanation of the procedure along with the  potential associated complications, an informed witnessed consent  was obtained. The risks of intracranial hemorrhage of 10%, worsening neurological deficit, ventilator dependency, death and inability to revascularize were all reviewed in detail with the patient's spouse. The patient was then put under general [facility]. The right groin was prepped and draped in the usual sterile fashion. Thereafter using modified Seldinger technique, transfemoral access into the right common femoral artery was obtained without difficulty. Over a 0.035 inch guidewire an 8 [place] Pinnacle 25 cm sheath was inserted. Through this, and also over a 0.035 inch guidewire a 5 [place] JB 1 catheter was advanced to the aortic arch region and selectively positioned in the right common carotid artery. FINDINGS: The right common carotid arteriogram demonstrates the right external carotid artery and its major branches to be widely patent. 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 More distally no reconstitution of the right internal carotid artery is seen to the cranial skull base from the external carotid artery branches. PROCEDURE: Over a 0.035 inch 300 cm Rosen exchange guidewire an 087 balloon guide catheter which had been prepped with 50% contrast and 50% heparinized saline infusion was advanced and positioned just proximal to the right common carotid bifurcation. The guidewire was removed. Good aspiration obtained from the hub of the balloon guide catheter. A gentle control arteriogram performed through this continued to demonstrate the stump of the occluded right internal carotid artery. Over a 0.014 inch standard Synchro micro guidewire with a mild J configuration an 021 160 cm Trevo ProVue microcatheter was advanced without difficulty to the horizontal petrous right ICA. The guidewire was removed. Good aspiration obtained from the  hub of the microcatheter. A gentle control arteriogram performed through the microcatheter demonstrated opacification of the right internal carotid artery distally with stasis. The microcatheter was then exchanged for an 014 inch 300 cm Transend EX micro guidewire with a mild J configuration. A 4 mm x 30 mm Viatrac angioplasty balloon catheter which had been prepped and purged with heparinized saline infusion was advanced using the rapid exchange technique and positioned with its markers optimally in the proximal right internal carotid artery. A control inflation was then performed via a micro inflation syringe device via micro tubing. 4 mm diameter level for approximately 60 seconds. Thereafter, balloon was deflated and retrieved and removed. A control arteriogram was performed through the balloon catheter in the right internal carotid artery proximally now demonstrated modestly increased flow more distally to the horizontal petrous segment. The combination of an 021 microcatheter, and the 136 cm 55 Zoom aspiration catheter was advanced to the distal end of the exchange micro guidewire. The micro guidewire was removed. Good aspiration obtained from the hub of the Zoom aspiration catheter. A gentle control arteriogram performed through the Zoom aspiration catheter now demonstrated opacification of the right internal carotid artery in the distal cervical petrous and caval cavernous segment. Complete occlusion with multiple filling defects was demonstrable in the origin of the ophthalmic artery. An 014 inch standard Synchro micro guidewire was reintroduced into the microcatheter, and advanced without difficulty through the occluded distal cavernous supraclinoid right ICA into the right middle cerebral artery inferior division in the M2 M3 region followed by the microcatheter. The guidewire was removed. Good aspiration was obtained from the hub of the microcatheter. Gentle control arteriogram performed through the  microcatheter demonstrated safe position of the tip of the microcatheter which was then connected to continuous heparinized saline infusion. A 6.5 mm x  47 mm retrieval device was then advanced to the distal end of the microcatheter. The retrieval was then deployed. The Zoom aspiration catheter was then advanced into the occluded M1 segment. With proximal flow arrest in the right common carotid artery, and constant aspiration being applied the hub of the Zoom aspiration catheter, and the 55 aspiration catheter for approximately 2-1/2 minutes, the combination of the retrieval device, the microcatheter, and the Zoom aspiration catheter were retrieved and removed. Copious amount of clot were seen entangled in the retrieval device and in the canister. Arteriogram performed following reversal of flow arrest, demonstrated revascularization of the inferior division of the right middle cerebral artery with persistent occlusion of the superior division achieving a TICI 2b revascularization. With the balloon guide in the proximal right internal carotid artery, the combination of the 55 Zoom aspiration catheter, the 021 microcatheter over a 0.014 inch standard Synchro micro guidewire was advanced to the supraclinoid right ICA. Using a torque device, access was obtained into the superior division M2 M3 region followed by the advancement of the microcatheter. The micro guidewire was removed. Good aspiration obtained from the hub of the microcatheter. A gentle control arteriogram performed through the microcatheter demonstrated safe position of the tip of the microcatheter. The Zoom aspiration catheter was advanced to just at the origin of the superior division. A 6.5 mm x 47 mm Embotrap retrieval device was then deployed in the usual manner as described above. With proximal flow arrest in the right internal carotid artery, and constant aspiration applied at the hub of the balloon guide catheter and at the hub of the 55 aspiration  catheter for approximately 2 minutes, the combination of the retrieval device, the microcatheter, and the 55 Zoom aspiration catheter were retrieved and removed. Again clot was seen entangled in the retrieval device. A control arteriogram performed through the balloon guide following flow reversal in the right internal carotid artery now demonstrated complete angiographic revascularization of the right internal carotid artery distally, and also the petrous, cavernous and supraclinoid segments. The right middle cerebral artery distribution demonstrated a TICI 3 revascularization. The right anterior cerebral artery remained widely patent. With the balloon guide catheter in the mid right internal carotid artery, an exchange micro guidewire was reintroduced with the distal end in the petrous horizontal segment. The balloon guide was then retrieved into the right common carotid artery. A diagnostic arteriogram performed through the balloon guide now demonstrated patency of the right internal carotid proximally associated with caliber irregularity, and a flat smooth filling defect in its mid cervical segment. It was decided to proceed with placement of a 6/8 mm x 4 mm Xact stent. The stent delivery apparatus was prepped and purged with heparinized saline infusion. Thereafter, using the rapid exchange technique, the stent delivery system was advanced and positioned such that the distal marker was just distal to the smooth flat filling defect and proximal was just inside the right common carotid artery. The stent was then deployed without any difficulty. The delivery system was then retrieved and removed. A control arteriogram performed through the balloon guide in the right common carotid artery demonstrated excellent apposition with smooth contour of the internal carotid artery in the stented segment. A control arteriogram performed more distally demonstrated significantly improved caliber and flow through the internal  carotid artery proximally and distally maintaining a TICI 3 revascularization of the right MCA distribution. Patient was given 81 mg of aspirin, and 180 mg of Brilinta via an orogastric tube prior to the angioplasty,  and also was given 6 mg of Integrilin intra-arterially. Control arteriograms were then performed at 15 and 30 minutes post deployment of the stent. These continued to demonstrate excellent flow through the stented segment, and also intracranially. Balloon guide was then retrieved and removed. Diagnostic catheter was then advanced to the left common carotid artery over a 0.035 inch Roadrunner guidewire. Arteriogram performed at this site demonstrated left internal carotid artery at the bulb to the cranial skull base to be widely patent. Mild to moderate fusiform dilatation was noted of the proximal cavernous segment. More distally the supraclinoid segments demonstrate wide patency. The left middle cerebral artery and the left anterior cerebral artery opacify into the capillary and venous phases. Transient cross-filling via the anterior communicating artery of the right anterior cerebral A2 segment was noticeable. Balloon guide was then retrieved and removed. An 8 French Angio-Seal closure device was then applied for hemostasis at the right femoral puncture site. Distal pulses remained present in both feet unchanged. Post CT of the brain demonstrated blush in the right basal ganglia without mass effect or shift. The patient was left intubated awaiting the results of the COVID-19 test. 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 PLAN: Follow-up in the clinic 4 weeks post discharge. Electronically Signed   By: Julieanne Cotton M.D.   On: 01/26/2021 16:06   IR CT Head Ltd  Result Date: 01/29/2021 INDICATION: Left sided hemiplegia, right gaze deviation and neglect on left-side. Occluded right internal carotid artery proximally, and right middle cerebral artery and the right anterior cerebral artery. EXAM: 1. EMERGENT LARGE VESSEL OCCLUSION THROMBOLYSIS (anterior CIRCULATION) COMPARISON:  CT angiogram of the head and neck of January 24, 2021. MEDICATIONS: Ancef 2 g IV antibiotic was administered within 1 hour of the procedure. ANESTHESIA/SEDATION: General anesthesia. CONTRAST:  Isovue 300 approximately 160 mL. FLUOROSCOPY TIME:  Fluoroscopy Time: 96 minutes 18 seconds (3550 mGy). COMPLICATIONS: None immediate. TECHNIQUE: Following a full explanation of the procedure along with the potential associated complications, an informed witnessed consent was obtained. The risks of intracranial hemorrhage of 10%, worsening neurological deficit, ventilator dependency, death and inability to revascularize were all reviewed in detail with the patient's spouse. The patient was then put under general anesthesia by the Department of Anesthesiology at [facility]. The right groin was prepped and draped in the usual sterile fashion. Thereafter using modified Seldinger technique, transfemoral access into the right common femoral artery was obtained without difficulty. Over a 0.035 inch guidewire an 8 [place] Pinnacle 25 cm sheath was inserted. Through this, and also over a 0.035 inch guidewire a 5 [place] JB 1 catheter was advanced to the aortic arch region and selectively positioned in the right common carotid artery. FINDINGS: The right common carotid arteriogram demonstrates the right external carotid artery and its major branches to be widely patent. Complete angiographic occlusion is seen of the right internal carotid  artery at the bulb. More distally no reconstitution of the right internal carotid artery is seen to the cranial skull base from the external carotid artery branches. PROCEDURE: Over a 0.035 inch 300 cm Rosen exchange guidewire an 087 balloon guide catheter which had been prepped with 50% contrast  and 50% heparinized saline infusion was advanced and positioned just proximal to the right common carotid bifurcation. The guidewire was removed. Good aspiration obtained from the hub of the balloon guide catheter. A gentle control arteriogram performed through this continued to demonstrate the stump of the occluded right internal carotid artery. Over a 0.014 inch standard Synchro micro guidewire with a mild J configuration an 021 160 cm Trevo ProVue microcatheter was advanced without difficulty to the horizontal petrous right ICA. The guidewire was removed. Good aspiration obtained from the hub of the microcatheter. A gentle control arteriogram performed through the microcatheter demonstrated opacification of the right internal carotid artery distally with stasis. The microcatheter was then exchanged for an 014 inch 300 cm Transend EX micro guidewire with a mild J configuration. A 4 mm x 30 mm Viatrac angioplasty balloon catheter which had been prepped and purged with heparinized saline infusion was advanced using the rapid exchange technique and positioned with its markers optimally in the proximal right internal carotid artery. A control inflation was then performed via a micro inflation syringe device via micro tubing. 4 mm diameter level for approximately 60 seconds. Thereafter, balloon was deflated and retrieved and removed. A control arteriogram was performed through the balloon catheter in the right internal carotid artery proximally now demonstrated modestly increased flow more distally to the horizontal petrous segment. The combination of an 021 microcatheter, and the 136 cm 55 Zoom aspiration catheter was  advanced to the distal end of the exchange micro guidewire. The micro guidewire was removed. Good aspiration obtained from the hub of the Zoom aspiration catheter. A gentle control arteriogram performed through the Zoom aspiration catheter now demonstrated opacification of the right internal carotid artery in the distal cervical petrous and caval cavernous segment. Complete occlusion with multiple filling defects was demonstrable in the origin of the ophthalmic artery. An 014 inch standard Synchro micro guidewire was reintroduced into the microcatheter, and advanced without difficulty through the occluded distal cavernous supraclinoid right ICA into the right middle cerebral artery inferior division in the M2 M3 region followed by the microcatheter. The guidewire was removed. Good aspiration was obtained from the hub of the microcatheter. Gentle control arteriogram performed through the microcatheter demonstrated safe position of the tip of the microcatheter which was then connected to continuous heparinized saline infusion. A 6.5 mm x 47 mm retrieval device was then advanced to the distal end of the microcatheter. The retrieval was then deployed. The Zoom aspiration catheter was then advanced into the occluded M1 segment. With proximal flow arrest in the right common carotid artery, and constant aspiration being applied the hub of the Zoom aspiration catheter, and the 55 aspiration catheter for approximately 2-1/2 minutes, the combination of the retrieval device, the microcatheter, and the Zoom aspiration catheter were retrieved and removed. Copious amount of clot were seen entangled in the retrieval device and in the canister. Arteriogram performed following reversal of flow arrest, demonstrated revascularization of the inferior division of the right middle cerebral artery with persistent occlusion of the superior division achieving a TICI 2b revascularization. With the balloon guide in the proximal right internal  carotid artery, the combination of the 55 Zoom aspiration catheter, the 021 microcatheter over a 0.014 inch standard Synchro micro guidewire was advanced to the supraclinoid right ICA. Using a torque device, access was obtained into the superior division M2 M3 region followed by the advancement of the microcatheter. The micro guidewire was removed. Good aspiration obtained from the hub of the microcatheter. A  gentle control arteriogram performed through the microcatheter demonstrated safe position of the tip of the microcatheter. The Zoom aspiration catheter was advanced to just at the origin of the superior division. A 6.5 mm x 47 mm Embotrap retrieval device was then deployed in the usual manner as described above. With proximal flow arrest in the right internal carotid artery, and constant aspiration applied at the hub of the balloon guide catheter and at the hub of the 55 aspiration catheter for approximately 2 minutes, the combination of the retrieval device, the microcatheter, and the 55 Zoom aspiration catheter were retrieved and removed. Again clot was seen entangled in the retrieval device. A control arteriogram performed through the balloon guide following flow reversal in the right internal carotid artery now demonstrated complete angiographic revascularization of the right internal carotid artery distally, and also the petrous, cavernous and supraclinoid segments. The right middle cerebral artery distribution demonstrated a TICI 3 revascularization. The right anterior cerebral artery remained widely patent. With the balloon guide catheter in the mid right internal carotid artery, an exchange micro guidewire was reintroduced with the distal end in the petrous horizontal segment. The balloon guide was then retrieved into the right common carotid artery. A diagnostic arteriogram performed through the balloon guide now demonstrated patency of the right internal carotid proximally associated with caliber  irregularity, and a flat smooth filling defect in its mid cervical segment. It was decided to proceed with placement of a 6/8 mm x 4 mm Xact stent. The stent delivery apparatus was prepped and purged with heparinized saline infusion. Thereafter, using the rapid exchange technique, the stent delivery system was advanced and positioned such that the distal marker was just distal to the smooth flat filling defect and proximal was just inside the right common carotid artery. The stent was then deployed without any difficulty. The delivery system was then retrieved and removed. A control arteriogram performed through the balloon guide in the right common carotid artery demonstrated excellent apposition with smooth contour of the internal carotid artery in the stented segment. A control arteriogram performed more distally demonstrated significantly improved caliber and flow through the internal carotid artery proximally and distally maintaining a TICI 3 revascularization of the right MCA distribution. Patient was given 81 mg of aspirin, and 180 mg of Brilinta via an orogastric tube prior to the angioplasty, and also was given 6 mg of Integrilin intra-arterially. Control arteriograms were then performed at 15 and 30 minutes post deployment of the stent. These continued to demonstrate excellent flow through the stented segment, and also intracranially. Balloon guide was then retrieved and removed. Diagnostic catheter was then advanced to the left common carotid artery over a 0.035 inch Roadrunner guidewire. Arteriogram performed at this site demonstrated left internal carotid artery at the bulb to the cranial skull base to be widely patent. Mild to moderate fusiform dilatation was noted of the proximal cavernous segment. More distally the supraclinoid segments demonstrate wide patency. The left middle cerebral artery and the left anterior cerebral artery opacify into the capillary and venous phases. Transient cross-filling  via the anterior communicating artery of the right anterior cerebral A2 segment was noticeable. Balloon guide was then retrieved and removed. An 8 French Angio-Seal closure device was then applied for hemostasis at the right femoral puncture site. Distal pulses remained present in both feet unchanged. Post CT of the brain demonstrated blush in the right basal ganglia without mass effect or shift. The patient was left intubated awaiting the results of the COVID-19 test.  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 PLAN: Follow-up in the clinic 4 weeks post discharge. Electronically Signed   By: Julieanne Cotton M.D.   On: 01/26/2021 16:06   CT CEREBRAL PERFUSION W CONTRAST  Result Date: 01/24/2021 CLINICAL DATA:  Left-sided deficits EXAM: CT ANGIOGRAPHY HEAD AND NECK CT PERFUSION BRAIN TECHNIQUE: Multidetector CT imaging of the head and neck was performed using the standard protocol during bolus administration of intravenous contrast. Multiplanar CT image reconstructions and MIPs were obtained to evaluate the vascular anatomy. Carotid stenosis measurements (when applicable) are obtained utilizing NASCET criteria, using the distal internal carotid diameter as the denominator. Multiphase CT imaging of the brain was performed following IV bolus contrast injection. Subsequent parametric perfusion maps were calculated using RAPID software. CONTRAST:  OMNIPAQUE IOHEXOL 350 MG/ML SOLN COMPARISON:  None. FINDINGS: CTA NECK FINDINGS SKELETON: There is no bony spinal canal stenosis. No lytic or blastic lesion.  OTHER NECK: Normal pharynx, larynx and major salivary glands. No cervical lymphadenopathy. Unremarkable thyroid gland. UPPER CHEST: No pneumothorax or pleural effusion. No nodules or masses. AORTIC ARCH: There is calcific atherosclerosis of the aortic arch. There is no aneurysm, dissection or hemodynamically significant stenosis of the visualized portion of the aorta. Conventional 3 vessel aortic branching pattern. The visualized proximal subclavian arteries are widely patent. 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 The right common and external carotid arteries are normal. LEFT CAROTID SYSTEM: No dissection, occlusion or aneurysm. Mild atherosclerotic calcification at the carotid bifurcation without hemodynamically significant stenosis. VERTEBRAL ARTERIES: Left dominant configuration. Both origins are clearly patent. There is no dissection, occlusion or flow-limiting stenosis to the skull base (V1-V3 segments). CTA HEAD FINDINGS POSTERIOR CIRCULATION: --Vertebral arteries: Normal V4 segments. --Inferior cerebellar arteries: Normal. --Basilar artery: Normal. --Superior cerebellar arteries: Normal. --Posterior cerebral arteries (PCA): Normal. ANTERIOR CIRCULATION: --Intracranial internal carotid arteries: Normal. --Anterior cerebral arteries (ACA): Normal. Both A1 segments are present. Patent anterior communicating artery (a-comm). --Middle cerebral arteries (MCA): Complete occlusion of the right MCA at its origin. There is no collateral flow demonstrated. VENOUS SINUSES: As permitted by contrast timing, patent. ANATOMIC VARIANTS: None Review of the MIP images confirms the above findings. CT Brain Perfusion Findings: ASPECTS: 7 CBF (<30%) Volume: 68mL Perfusion (Tmax>6.0s) volume: 2 L65mL Mismatch Volume: Infarction Location:Right MCA territory IMPRESSION: 1. 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 2. 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 3. 68 mL right MCA territory core infarct with 133 mL area of ischemic penumbra. Critical Value/emergent results were called by telephone at the time of interpretation on 01/24/2021 at 11:24 pm to provider Leonard [name] , who verbally acknowledged these results. Aortic Atherosclerosis (ICD10-I70.0). Electronically Signed   By: Deatra Robinson M.D.   On: 01/24/2021 23:34   DG CHEST PORT 1 VIEW  Result Date: 02/03/2021 CLINICAL DATA:  Tachypnea EXAM: PORTABLE CHEST 1 VIEW COMPARISON:  Radiograph 02/01/2021 FINDINGS: Vaginal a transesophageal feeding tube tip terminates near the gastric antrum/duodenal bulb. Telemetry leads overlie the chest. Nasal cannula overlies the upper chest. Continued increase in the patchy, heterogeneous  bilateral pulmonary opacities most coalescent in the right upper lobe though increasingly dense throughout the left hemithorax as well. Stable cardiomediastinal contours accounting for differences in technique. No pneumothorax or visible layering effusion. No acute osseous or soft tissue abnormality. Degenerative changes are present in the imaged spine and shoulders. IMPRESSION: Increasing bilateral opacities, particularly in the right upper lobe and throughout the left hemithorax. Could reflect worsening multifocal infection and/or edema. Stable cardiomegaly. Feeding tube tip near the gastric antrum/duodenal bulb. Electronically Signed   By: Kreg Shropshire M.D.   On: 02/03/2021 06:00   DG CHEST PORT 1 VIEW  Result Date: 02/01/2021 CLINICAL DATA:  Fever. EXAM: PORTABLE CHEST 1 VIEW COMPARISON:  Chest x-ray 01/29/2021, 01/25/2021. FINDINGS: Feeding tube noted with tip below left hemidiaphragm. Cardiomegaly. Progressive bilateral pulmonary infiltrates/edema, particularly in the right upper lung. Multifocal pneumonia and or pulmonary edema could present in this fashion. No pleural effusion or pneumothorax. Degenerative change thoracic  spine. IMPRESSION: Feeding tube noted with tip below left hemidiaphragm. 2.  Cardiomegaly. 3. Progressive bilateral pulmonary infiltrates/edema, particularly in the right upper lung. Multifocal pneumonia and or pulmonary edema could present this fashion. Electronically Signed   By: Maisie Fus  Register   On: 02/01/2021 15:19   DG Chest Port 1 View  Result Date: 01/29/2021 CLINICAL DATA:  Encounter for compromised airway. EXAM: PORTABLE CHEST 1 VIEW COMPARISON:  01/27/2021. FINDINGS: Feeding tube is followed into the stomach with the tip projecting beyond the inferior margin of the image. Trachea is midline. Heart is enlarged, stable. Lungs are low in volume with increasing mixed interstitial and airspace opacification, worst in the right upper lobe. No definite pleural fluid. IMPRESSION: Low lung volumes with increasing mixed interstitial and airspace opacification, findings which Delahoussaye be due to edema, pneumonia and/or aspiration. Electronically Signed   By: Leanna Battles M.D.   On: 01/29/2021 08:37   DG Chest Port 1 View  Result Date: 01/27/2021 CLINICAL DATA:  Shortness of breath EXAM: PORTABLE CHEST 1 VIEW COMPARISON:  January 25, 2021 FINDINGS: The ET tube is been removed. A feeding tube is been placed, terminating below today's film. The NG tube is been removed. No pneumothorax. The cardiomediastinal silhouette is stable. The left lung is clear. No acute abnormalities seen on the right. IMPRESSION: 1. Support apparatus as above. 2. No acute abnormalities. Electronically Signed   By: Gerome Sam III M.D   On: 01/27/2021 14:21   DG CHEST PORT 1 VIEW  Result Date: 01/25/2021 CLINICAL DATA:  Intubation. EXAM: PORTABLE CHEST 1 VIEW COMPARISON:  No prior. FINDINGS: Endotracheal tube and NG tube in good anatomic position. Cardiomegaly. No pulmonary venous congestion. Low lung volumes with bibasilar atelectasis. No pleural effusion or pneumothorax. IMPRESSION: 1.  Endotracheal tube and NG tube in good  anatomic position. 2.  Cardiomegaly.  No pulmonary venous congestion. 3.  Low lung volumes with bibasilar atelectasis. Electronically Signed   By: Maisie Fus  Register   On: 01/25/2021 05:11   DG Swallowing Func-Speech Pathology  Result Date: 02/14/2021 Objective Swallowing Evaluation: Type of Study: MBS-Modified Barium Swallow Study  Patient Details Name: Leonard [name] MRN: [id number] Date of Birth: 25-Balboa-1957 Today's Date: 02/14/2021 Time: SLP Start Time (ACUTE ONLY): 1354 -SLP Stop Time (ACUTE ONLY): 1418 SLP Time Calculation (min) (ACUTE ONLY): 24.88 min Past Medical History: No past medical history on file. Past Surgical History: Past Surgical History: Procedure Laterality Date . IR ANGIO INTRA EXTRACRAN SEL COM CAROTID INNOMINATE UNI L MOD SED  01/25/2021 . IR CT HEAD LTD  01/25/2021 . IR CT HEAD LTD  01/25/2021 . IR PERCUTANEOUS ART THROMBECTOMY/INFUSION INTRACRANIAL INC DIAG ANGIO  01/25/2021 . RADIOLOGY WITH ANESTHESIA N/A 01/24/2021  Procedure: IR WITH ANESTHESIA;  Surgeon: Julieanne Cotton, MD;  Location: [facility] OR;  Service: Radiology;  Laterality: N/A; HPI: Pt is a 65 y.o. male with no significant PMHx who presented via EMS as a code stroke for acute onset of left hemiplegia, left facial droop, dysarthria and left hemineglect. NIHSS was 18 on admission and tPA was given. 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 Pt s/p  s/p bilateral common carotid arteriograms followed by right MCA/ACA/ICA thrombus retrieval with stent angioplasty 4/21. Pt was intubated for procedure. CT head 4/22: Large area of hypoattenuation at the site of known right MCA territory infarct. Small focus of parenchymal hyperdensity at the posterior aspect of the infarct site Ray[place] indicate a small amount of petechial hemorrhage or contrast extravasation from procedure. Small amount of subarachnoid hyperdensity over the right frontal convexity, either subarachnoid  blood or contrast taining. MBS 4/26: a moderate oral and moderate to severe pharyngeal dysphagia with sensory and motor deficits of neurogenic etiology.  Subjective: -- (alert) Assessment / Plan / Recommendation CHL IP CLINICAL IMPRESSIONS 02/14/2021 Clinical Impression Pt presents with improved oropharyngeal function compared to that noted during the last MBS on 01/30/21. Pt demonstrated oropharyngeal dysphagia characterized by reduced labial seal and stripping, weak manipulation, impaired bolus propulsion, reduced bolus cohesion, a pharyngeal delay, premature spillage to the valleculae with intermittent spillover to the pyriform sinuses, reduced anterior laryngeal movement, and reduced pharyngeal constriction. He demonstrated anterior spillage to the left, residue in the left lateral sulcus, difficulty with A-P transport, vallecular residue, pyriform sinuse residue, and posterior pharyngeal wall residue. He inermittently demonstrated incomplete epiglottic inversion which facilitated penetration (PAS 3) and occasional aspiration (PAS 7) of thin liquids and once with a dual consistency bolus (i.e., nectar thick liquids with masticated regular textures). Pt's cough was inconsistently delayed and variability in effectiveness was also noted. No functional benefit was noted with pt's independent use of a posterior head tilt to facilitate A-P transport of solids. Regular textures, a barium tablet, and dysphagia 3 solids were ultimately removed from the oral cavity after multiple unsuccessful attempts at A-P transport. Amount of pharyngeal residue increased with advancement of solids, but was improved with a liquid wash and independent secondary swallows. A dysphagia 2 diet with nectar thick liquids will be initiated at this time. SLP is hopeful that G-tube placement can be avoided if p.o. intake is tolerated and quantity is adequate. Pt's results and recommendations were discussed with Dr. Jerral [name] and Leonard [name], [facility];  it was agreed that pt's Cortrak will be removed, and that G-tube placement will reassessed on Monday. SLP will continue to follow pt for treatment. SLP Visit Diagnosis Dysphagia, oropharyngeal phase (R13.12) Attention and concentration deficit following -- Frontal lobe and executive function deficit following -- Impact on safety and function --   CHL IP TREATMENT RECOMMENDATION 02/14/2021 Treatment Recommendations Therapy as outlined in treatment plan below   Prognosis 02/14/2021 Prognosis for Safe Diet Advancement Good Barriers to Reach Goals Cognitive deficits;Severity of deficits Barriers/Prognosis Comment -- [facility]L IP DIET RECOMMENDATION 02/14/2021 SLP Diet Recommendations Dysphagia 2 (Fine chop) solids;Nectar thick liquid Liquid Administration via Cup;No straw Medication Administration Crushed with puree Compensations Slow rate;Small sips/bites;Multiple dry swallows after each bite/sip;Follow solids with liquid Postural Changes Seated upright at 90 degrees   CHL IP OTHER  RECOMMENDATIONS 02/14/2021 Recommended Consults -- Oral Care Recommendations Oral care BID Other Recommendations Order thickener from pharmacy   CHL IP FOLLOW UP RECOMMENDATIONS 02/14/2021 Follow up Recommendations Inpatient Rehab   CHL IP FREQUENCY AND DURATION 02/14/2021 Speech Therapy Frequency (ACUTE ONLY) min 2x/week Treatment Duration 2 weeks      CHL IP ORAL PHASE 02/14/2021 Oral Phase Impaired Oral - Pudding Teaspoon -- Oral - Pudding Cup -- Oral - Honey Teaspoon -- Oral - Honey Cup Weak lingual manipulation;Lingual/palatal residue;Decreased bolus cohesion;Premature spillage;Reduced posterior propulsion Oral - Nectar Teaspoon -- Oral - Nectar Cup Weak lingual manipulation;Lingual/palatal residue;Decreased bolus cohesion;Premature spillage;Reduced posterior propulsion;Left anterior bolus loss Oral - Nectar Straw Weak lingual manipulation;Lingual/palatal residue;Decreased bolus cohesion;Premature spillage;Reduced posterior propulsion;Left anterior  bolus loss Oral - Thin Teaspoon -- Oral - Thin Cup Weak lingual manipulation;Lingual/palatal residue;Decreased bolus cohesion;Premature spillage;Reduced posterior propulsion;Left anterior bolus loss Oral - Thin Straw Weak lingual manipulation;Lingual/palatal residue;Decreased bolus cohesion;Premature spillage;Reduced posterior propulsion;Left anterior bolus loss Oral - Puree Weak lingual manipulation;Lingual/palatal residue;Decreased bolus cohesion;Reduced posterior propulsion;Left anterior bolus loss Oral - Mech Soft Weak lingual manipulation;Lingual/palatal residue;Reduced posterior propulsion Oral - Regular Weak lingual manipulation;Lingual/palatal residue;Reduced posterior propulsion Oral - Multi-Consistency -- Oral - Pill Weak lingual manipulation;Lingual/palatal residue;Reduced posterior propulsion Oral Phase - Comment --  CHL IP PHARYNGEAL PHASE 02/14/2021 Pharyngeal Phase Impaired Pharyngeal- Pudding Teaspoon -- Pharyngeal -- Pharyngeal- Pudding Cup -- Pharyngeal -- Pharyngeal- Honey Teaspoon -- Pharyngeal -- Pharyngeal- Honey Cup Reduced epiglottic inversion;Reduced anterior laryngeal mobility;Pharyngeal residue - valleculae;Pharyngeal residue - pyriform Pharyngeal -- Pharyngeal- Nectar Teaspoon -- Pharyngeal -- Pharyngeal- Nectar Cup Reduced epiglottic inversion;Reduced anterior laryngeal mobility;Pharyngeal residue - valleculae;Pharyngeal residue - pyriform;Penetration/Aspiration during swallow Pharyngeal Material enters airway, remains ABOVE vocal cords then ejected out Pharyngeal- Nectar Straw Reduced epiglottic inversion;Reduced anterior laryngeal mobility;Pharyngeal residue - valleculae;Pharyngeal residue - pyriform;Penetration/Aspiration during swallow Pharyngeal Material enters airway, remains ABOVE vocal cords and not ejected out;Material enters airway, passes BELOW cords and not ejected out despite cough attempt by patient Pharyngeal- Thin Teaspoon -- Pharyngeal -- Pharyngeal- Thin Cup Reduced  epiglottic inversion;Reduced anterior laryngeal mobility;Pharyngeal residue - valleculae;Pharyngeal residue - pyriform;Penetration/Aspiration during swallow Pharyngeal Material enters airway, passes BELOW cords and not ejected out despite cough attempt by patient Pharyngeal- Thin Straw Reduced epiglottic inversion;Reduced anterior laryngeal mobility;Pharyngeal residue - valleculae;Pharyngeal residue - pyriform;Penetration/Aspiration during swallow Pharyngeal Material enters airway, passes BELOW cords and not ejected out despite cough attempt by patient Pharyngeal- Puree Reduced epiglottic inversion;Reduced anterior laryngeal mobility;Pharyngeal residue - valleculae;Pharyngeal residue - pyriform Pharyngeal -- Pharyngeal- Mechanical Soft -- Pharyngeal -- Pharyngeal- Regular -- Pharyngeal -- Pharyngeal- Multi-consistency -- Pharyngeal -- Pharyngeal- Pill -- Pharyngeal -- Pharyngeal Comment --  CHL IP CERVICAL ESOPHAGEAL PHASE 02/14/2021 Cervical Esophageal Phase WFL Pudding Teaspoon -- Pudding Cup -- Honey Teaspoon -- Honey Cup WFL Nectar Teaspoon -- Nectar Cup -- Nectar Straw -- Thin Teaspoon -- Thin Cup -- Thin Straw -- Puree -- Mechanical Soft -- Regular -- Multi-consistency -- Pill -- Cervical Esophageal Comment -- Leonard [name] 02/14/2021, 3:47 PM              DG Swallowing Func-Speech Pathology  Result Date: 01/30/2021 Objective Swallowing Evaluation: Type of Study: Bedside Swallow Evaluation  Patient Details Name: Leonard [name] MRN: [id number] Date of Birth: [birth year] Today's Date: 01/30/2021 Time: SLP Start Time (ACUTE ONLY): 1205 -SLP Stop Time (ACUTE ONLY): 1215 SLP Time Calculation (min) (ACUTE ONLY): 10 min Past Medical History: No past medical history on file. Past Surgical History: Past Surgical History: Procedure Laterality Date . IR ANGIO  INTRA EXTRACRAN SEL COM CAROTID INNOMINATE UNI L MOD SED  01/25/2021 . IR CT HEAD LTD  01/25/2021 . IR CT HEAD LTD  01/25/2021 . IR PERCUTANEOUS ART  THROMBECTOMY/INFUSION INTRACRANIAL INC DIAG ANGIO  01/25/2021 . RADIOLOGY WITH ANESTHESIA N/A 01/24/2021  Procedure: IR WITH ANESTHESIA;  Surgeon: Julieanne Cotton, MD;  Location: [facility] OR;  Service: Radiology;  Laterality: N/A; HPI: Pt is a 64 y.o. male with no significant PMHx who presented via EMS as a code stroke for acute onset of left hemiplegia, left facial droop, dysarthria and left hemineglect. NIHSS was 18 on admission and tPA was given. 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 Pt s/p  s/p bilateral common carotid arteriograms followed by right MCA/ACA/ICA thrombus retrieval with stent angioplasty 4/21. Pt was intubated for procedure. CT head 4/22: Large area of hypoattenuation at the site of known right MCA territory infarct. Small focus of parenchymal hyperdensity at the posterior aspect of the infarct site Dirico indicate a small amount of petechial hemorrhage or contrast extravasation from procedure. Small amount of subarachnoid hyperdensity over the right frontal convexity, either subarachnoid blood or contrast taining.  Subjective: alert with cueing, pleasant Assessment / Plan / Recommendation CHL IP CLINICAL IMPRESSIONS 01/30/2021 Clinical Impression Pt presents with a moderate oral and moderate to severe pharyngeal dysphagia with sensory and motor deficits of neurogenic etiology. Oral deficit impairments included reduced left facial, labial, and lingual strength and ROM with decreased sensation. This allowed for reduced left sided anterior spillage, reduced bolus cohesion, and oral residuals. Pharyngeal deficits c/b reduced timing and efficiency of laryngeal vestibule closure, incomplete epiglottic deflection, reduced pharyngeal constriction, decreased base of tongue retraction, diminished sensation, and decreased UES distention. This resulted in pre, during, and post swallow penetration of all consistencies trialed to the  level of the true vocal cords (puree, nectar thick, and honey thick liquids). Pt with incosistent sensation of reduced airway protection events. Cues for cough and or throat clear aided in expelling penetrates. Moderate valleuclar and pyriform sinsus residuals noted with thicker POs. Pt very high aspiration risk for POs across a meal given deficits noted this date. Recommend continue short term alternative nutrition and NPO with ice chips following diligent oral care and intensive swallowing therapy. SLP to follow up. SLP Visit Diagnosis Dysphagia, oropharyngeal phase (R13.12) Attention and concentration deficit following -- Frontal lobe and executive function deficit following -- Impact on safety and function Moderate aspiration risk;Severe aspiration risk   CHL IP TREATMENT RECOMMENDATION 01/30/2021 Treatment Recommendations Therapy as outlined in treatment plan below   Prognosis 01/30/2021 Prognosis for Safe Diet Advancement Good Barriers to Reach Goals Severity of deficits;Time post onset Barriers/Prognosis Comment -- CHL IP DIET RECOMMENDATION 01/30/2021 SLP Diet Recommendations NPO Liquid Administration via -- Medication Administration Via alternative means Compensations -- Postural Changes --   CHL IP OTHER RECOMMENDATIONS 01/30/2021 Recommended Consults -- Oral Care Recommendations Oral care QID Other Recommendations --   CHL IP FOLLOW UP RECOMMENDATIONS 01/30/2021 Follow up Recommendations Inpatient Rehab   CHL IP FREQUENCY AND DURATION 01/30/2021 Speech Therapy Frequency (ACUTE ONLY) min 3x week Treatment Duration 2 weeks      CHL IP ORAL PHASE 01/30/2021 Oral Phase Impaired Oral - Pudding Teaspoon -- Oral - Pudding Cup -- Oral - Honey Teaspoon -- Oral - Honey Cup Weak lingual manipulation;Reduced posterior propulsion;Left pocketing in lateral sulci;Lingual/palatal residue;Decreased bolus cohesion;Delayed oral transit Oral - Nectar Teaspoon -- Oral - Nectar Cup Weak lingual manipulation;Reduced  posterior  propulsion;Lingual/palatal residue;Delayed oral transit;Decreased bolus cohesion Oral - Nectar Straw -- Oral - Thin Teaspoon -- Oral - Thin Cup -- Oral - Thin Straw -- Oral - Puree Weak lingual manipulation;Reduced posterior propulsion;Delayed oral transit;Decreased bolus cohesion;Lingual/palatal residue Oral - Mech Soft -- Oral - Regular -- Oral - Multi-Consistency -- Oral - Pill -- Oral Phase - Comment --  CHL IP PHARYNGEAL PHASE 01/30/2021 Pharyngeal Phase Impaired Pharyngeal- Pudding Teaspoon -- Pharyngeal -- Pharyngeal- Pudding Cup -- Pharyngeal -- Pharyngeal- Honey Teaspoon -- Pharyngeal -- Pharyngeal- Honey Cup Reduced epiglottic inversion;Reduced pharyngeal peristalsis;Reduced anterior laryngeal mobility;Reduced laryngeal elevation;Reduced airway/laryngeal closure;Reduced tongue base retraction;Penetration/Aspiration before swallow;Penetration/Aspiration during swallow;Pharyngeal residue - valleculae;Pharyngeal residue - pyriform Pharyngeal Material enters airway, remains ABOVE vocal cords and not ejected out;Material enters airway, CONTACTS cords and not ejected out Pharyngeal- Nectar Teaspoon -- Pharyngeal -- Pharyngeal- Nectar Cup Reduced epiglottic inversion;Reduced anterior laryngeal mobility;Reduced laryngeal elevation;Reduced airway/laryngeal closure;Reduced tongue base retraction;Penetration/Aspiration before swallow;Penetration/Aspiration during swallow;Pharyngeal residue - valleculae;Pharyngeal residue - pyriform;Reduced pharyngeal peristalsis Pharyngeal Material enters airway, remains ABOVE vocal cords and not ejected out;Material enters airway, CONTACTS cords and not ejected out Pharyngeal- Nectar Straw -- Pharyngeal -- Pharyngeal- Thin Teaspoon -- Pharyngeal -- Pharyngeal- Thin Cup -- Pharyngeal -- Pharyngeal- Thin Straw -- Pharyngeal -- Pharyngeal- Puree Delayed swallow initiation-vallecula;Reduced pharyngeal peristalsis;Reduced epiglottic inversion;Reduced anterior laryngeal mobility;Reduced  laryngeal elevation;Reduced airway/laryngeal closure;Reduced tongue base retraction;Penetration/Aspiration before swallow;Penetration/Aspiration during swallow;Pharyngeal residue - pyriform;Pharyngeal residue - valleculae Pharyngeal Material enters airway, remains ABOVE vocal cords and not ejected out;Material enters airway, CONTACTS cords and not ejected out Pharyngeal- Mechanical Soft -- Pharyngeal -- Pharyngeal- Regular -- Pharyngeal -- Pharyngeal- Multi-consistency -- Pharyngeal -- Pharyngeal- Pill -- Pharyngeal -- Pharyngeal Comment --  CHL IP CERVICAL ESOPHAGEAL PHASE 01/30/2021 Cervical Esophageal Phase Impaired Pudding Teaspoon -- Pudding Cup -- Honey Teaspoon -- Honey Cup Reduced cricopharyngeal relaxation Nectar Teaspoon -- Nectar Cup Reduced cricopharyngeal relaxation Nectar Straw -- Thin Teaspoon -- Thin Cup -- Thin Straw -- Puree Reduced cricopharyngeal relaxation Mechanical Soft -- Regular -- Multi-consistency -- Pill -- Cervical Esophageal Comment -- Leonard E Hartness MA, CCC-SLP 01/30/2021, 1:55 PM              EEG adult  Result Date: 02/07/2021 Leonard Quest, MD     02/07/2021  5:59 PM Patient Name: Leonard [name] MRN: [id number] Epilepsy Attending: Charlsie [name] Referring Physician/Provider: Dr Huey Bienenstock Date: 02/07/2021 Duration: 23.14 mins Patient history: 65yo M with right MCA infarct and ams. EEG to evaluate for seizure Level of alertness: Awake AEDs during EEG study: None Technical aspects: This EEG study was done with scalp electrodes positioned according to the 10-20 International system of electrode placement. Electrical activity was acquired at a sampling rate of 500Hz  and reviewed with a high frequency filter of 70Hz  and a low frequency filter of 1Hz . EEG data were recorded continuously and digitally stored. Description: The posterior dominant rhythm consists of 8 Hz activity of moderate voltage (25-35 uV) seen predominantly in posterior head regions, asymmetric( R<L) and  reactive to eye opening and eye closing. EEG showed continuous generalized and lateralized right hemisphere 3 to 6 Hz theta-delta slowing.  Hyperventilation and photic stimulation were not performed.   ABNORMALITY - Continuous slow, generalized and lateralized right hemisphere IMPRESSION: This study is suggestive of cortical dysfunction arising from right hemisphere likely secondary to underlying structural abnormality/stroke. Additionally, there is mild diffuse encephalopathy, nonspecific etiology. No seizures or epileptiform discharges were seen throughout the recording. Leonard [name]   EEG adult  Result Date: 01/31/2021 Leonard Quest, MD     01/31/2021  6:52 PM Patient Name: Leonard [name] MRN: [id number] Epilepsy Attending: Charlsie [name] Referring Physician/Provider: Dr Delia Heady Date: 01/31/2021 Duration: 23.32 mins Patient history: 65yo M with R MCA infarct, with ams. EEG to evaluate for seizure Level of alertness: Awake AEDs during EEG study: None Technical aspects: This EEG study was done with scalp electrodes positioned according to the 10-20 International system of electrode placement. Electrical activity was acquired at a sampling rate of 500Hz  and reviewed with a high frequency filter of 70Hz  and a low frequency filter of 1Hz . EEG data were recorded continuously and digitally stored. Description: No clear posterior dominant rhythm was seen. EEG showed continuous generalized polymorphic mixed frequencies with predominantly to 6-9hz  Hz theta activity admixed with 2-3hz  delta slowing. Additionally there is continuous 3-5hz  theta-delta slowing in right temporal region. Hyperventilation and photic stimulation were not performed.   ABNORMALITY - Continuous slow, generalized and maximal right temporal region IMPRESSION: This study is suggestive of cortical dysfunction arising from right temporal region, likely secondary to underlying structural abnormality as well as mild to moderate diffuse  encephalopathy, nonspecific etiology. No seizures or epileptiform discharges were seen throughout the recording. Leonard [name]   ECHOCARDIOGRAM COMPLETE  Result Date: 01/25/2021    ECHOCARDIOGRAM REPORT   Patient Name:   Leonard [name] Date of Exam: 01/25/2021 Medical Rec #:  [id number]    Height:       69.0 in Accession #:    [id number]   Weight:       182.1 lb Date of Birth:  Engert 25, 1957   BSA:          1.985 m Patient Age:    64 years     BP:           117/59 mmHg Patient Gender: M            HR:           75 bpm. Exam Location:  Inpatient Procedure: 2D Echo, Color Doppler and Cardiac Doppler Indications:    Stroke I63.9  History:        Patient has no prior history of Echocardiogram examinations.  Sonographer:    Elmarie Shiley Dance Referring Phys: [id number] Leonard [name] IMPRESSIONS  1. Left ventricular ejection fraction, by estimation, is 60 to 65%. The left ventricle has normal function. The left ventricle has no regional wall motion abnormalities. There is mild left ventricular hypertrophy. Left ventricular diastolic parameters were normal.  2. Right ventricular systolic function is normal. The right ventricular size is normal.  3. Left atrial size was moderately dilated.  4. The mitral valve is normal in structure. No evidence of mitral valve regurgitation. No evidence of mitral stenosis.  5. The aortic valve is tricuspid. Aortic valve regurgitation is not visualized. No aortic stenosis is present.  6. The inferior vena cava is normal in size with greater than 50% respiratory variability, suggesting right atrial pressure of 3 mmHg. FINDINGS  Left Ventricle: Left ventricular ejection fraction, by estimation, is 60 to 65%. The left ventricle has normal function. The left ventricle has no regional wall motion abnormalities. The left ventricular internal cavity size was normal in size. There is  mild left ventricular hypertrophy. Left ventricular diastolic parameters were normal. Right Ventricle: The right ventricular  size is normal. No increase in right ventricular wall thickness. Right ventricular systolic function is normal. Left Atrium: Left atrial size was moderately dilated. Right Atrium: Right  atrial size was normal in size. Pericardium: There is no evidence of pericardial effusion. Mitral Valve: The mitral valve is normal in structure. No evidence of mitral valve regurgitation. No evidence of mitral valve stenosis. Tricuspid Valve: The tricuspid valve is normal in structure. Tricuspid valve regurgitation is not demonstrated. No evidence of tricuspid stenosis. Aortic Valve: The aortic valve is tricuspid. Aortic valve regurgitation is not visualized. No aortic stenosis is present. Pulmonic Valve: The pulmonic valve was normal in structure. Pulmonic valve regurgitation is not visualized. No evidence of pulmonic stenosis. Aorta: The aortic root is normal in size and structure. Venous: The inferior vena cava is normal in size with greater than 50% respiratory variability, suggesting right atrial pressure of 3 mmHg. IAS/Shunts: No atrial level shunt detected by color flow Doppler.  LEFT VENTRICLE PLAX 2D LVIDd:         5.20 cm Diastology LVIDs:         3.10 cm LV e' medial:    5.22 cm/s LV PW:         1.40 cm LV E/e' medial:  13.2 LV IVS:        1.40 cm LV e' lateral:   8.16 cm/s                        LV E/e' lateral: 8.5  RIGHT VENTRICLE             IVC RV Basal diam:  1.90 cm     IVC diam: 2.20 cm RV S prime:     13.10 cm/s TAPSE (M-mode): 1.9 cm LEFT ATRIUM             Index       RIGHT ATRIUM           Index LA diam:        4.60 cm 2.32 cm/m  RA Area:     10.90 cm LA Vol (A2C):   44.1 ml 22.21 ml/m RA Volume:   18.40 ml  9.27 ml/m LA Vol (A4C):   45.2 ml 22.77 ml/m LA Biplane Vol: 46.6 ml 23.47 ml/m  AORTIC VALVE LVOT Vmax:   129.00 cm/s LVOT Vmean:  89.700 cm/s LVOT VTI:    0.311 m  AORTA Ao Asc diam: 3.40 cm MITRAL VALVE MV Area (PHT): 3.08 cm     SHUNTS MV Decel Time: 246 msec     Systemic VTI: 0.31 m MV E  velocity: 69.00 cm/s MV A velocity: 105.00 cm/s MV E/A ratio:  0.66 Charlton Haws MD Electronically signed by Charlton Haws MD Signature Date/Time: 01/25/2021/1:07:57 PM    Final    IR PERCUTANEOUS ART THROMBECTOMY/INFUSION INTRACRANIAL INC DIAG ANGIO  Result Date: 01/29/2021 INDICATION: Left sided hemiplegia, right gaze deviation and neglect on left-side. Occluded right internal carotid artery proximally, and right middle cerebral artery and the right anterior cerebral artery. EXAM: 1. EMERGENT LARGE VESSEL OCCLUSION THROMBOLYSIS (anterior CIRCULATION) COMPARISON:  CT angiogram of the head and neck of January 24, 2021. MEDICATIONS: Ancef 2 g IV antibiotic was administered within 1 hour of the procedure. ANESTHESIA/SEDATION: General anesthesia. CONTRAST:  Isovue 300 approximately 160 mL. FLUOROSCOPY TIME:  Fluoroscopy Time: 96 minutes 18 seconds (3550 mGy). COMPLICATIONS: None immediate. TECHNIQUE: Following a full explanation of the procedure along with the potential associated complications, an informed witnessed consent was obtained. The risks of intracranial hemorrhage of 10%, worsening neurological deficit, ventilator dependency, death and inability to revascularize were all reviewed in detail with the patient's  spouse. The patient was then put under general anesthesia by the Department of Anesthesiology at [name]. The right groin was prepped and draped in the usual sterile fashion. Thereafter using modified Seldinger technique, transfemoral access into the right common femoral artery was obtained without difficulty. Over a 0.035 inch guidewire an 8 [place] Pinnacle 25 cm sheath was inserted. Through this, and also over a 0.035 inch guidewire a 5 [place] JB 1 catheter was advanced to the aortic arch region and selectively positioned in the right common carotid artery. FINDINGS: The right common carotid arteriogram demonstrates the right external carotid artery and its major branches to be widely  patent. 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 More distally no reconstitution of the right internal carotid artery is seen to the cranial skull base from the external carotid artery branches. PROCEDURE: Over a 0.035 inch 300 cm Rosen exchange guidewire an 087 balloon guide catheter which had been prepped with 50% contrast and 50% heparinized saline infusion was advanced and positioned just proximal to the right common carotid bifurcation. The guidewire was removed. Good aspiration obtained from the hub of the balloon guide catheter. A gentle control arteriogram performed through this continued to demonstrate the stump of the occluded right internal carotid artery. Over a 0.014 inch standard Synchro micro guidewire with a mild J configuration an 021 160 cm Trevo ProVue microcatheter was advanced without difficulty to the horizontal petrous right ICA. The guidewire was removed. Good aspiration obtained from the hub of the microcatheter. A gentle control arteriogram performed through the microcatheter demonstrated opacification of the right internal carotid artery distally with stasis. The microcatheter was then exchanged for an 014 inch 300 cm Transend EX micro guidewire with a mild J configuration. A 4 mm x 30 mm Viatrac angioplasty balloon catheter which had been prepped and purged with heparinized saline infusion was advanced using the rapid exchange technique and positioned with its markers optimally in the proximal right internal carotid artery. A control inflation was then performed via a micro inflation syringe device via micro tubing. 4 mm diameter level for approximately 60 seconds. Thereafter, balloon was deflated and retrieved and removed. A control arteriogram was performed through the balloon catheter in the right internal carotid artery proximally now demonstrated modestly increased flow more distally to the horizontal petrous segment. The combination  of an 021 microcatheter, and the 136 cm 55 Zoom aspiration catheter was advanced to the distal end of the exchange micro guidewire. The micro guidewire was removed. Good aspiration obtained from the hub of the Zoom aspiration catheter. A gentle control arteriogram performed through the Zoom aspiration catheter now demonstrated opacification of the right internal carotid artery in the distal cervical petrous and caval cavernous segment. Complete occlusion with multiple filling defects was demonstrable in the origin of the ophthalmic artery. An 014 inch standard Synchro micro guidewire was reintroduced into the microcatheter, and advanced without difficulty through the occluded distal cavernous supraclinoid right ICA into the right middle cerebral artery inferior division in the M2 M3 region followed by the microcatheter. The guidewire was removed. Good aspiration was obtained from the hub of the microcatheter. Gentle control arteriogram performed through the microcatheter demonstrated safe position of the tip of the microcatheter which was then connected to continuous heparinized saline infusion. A 6.5 mm x 47 mm retrieval device was then advanced to the distal end of the microcatheter. The retrieval was then deployed. The Zoom aspiration catheter was then advanced into  the occluded M1 segment. With proximal flow arrest in the right common carotid artery, and constant aspiration being applied the hub of the Zoom aspiration catheter, and the 55 aspiration catheter for approximately 2-1/2 minutes, the combination of the retrieval device, the microcatheter, and the Zoom aspiration catheter were retrieved and removed. Copious amount of clot were seen entangled in the retrieval device and in the canister. Arteriogram performed following reversal of flow arrest, demonstrated revascularization of the inferior division of the right middle cerebral artery with persistent occlusion of the superior division achieving a TICI 2b  revascularization. With the balloon guide in the proximal right internal carotid artery, the combination of the 55 Zoom aspiration catheter, the 021 microcatheter over a 0.014 inch standard Synchro micro guidewire was advanced to the supraclinoid right ICA. Using a torque device, access was obtained into the superior division M2 M3 region followed by the advancement of the microcatheter. The micro guidewire was removed. Good aspiration obtained from the hub of the microcatheter. A gentle control arteriogram performed through the microcatheter demonstrated safe position of the tip of the microcatheter. The Zoom aspiration catheter was advanced to just at the origin of the superior division. A 6.5 mm x 47 mm Embotrap retrieval device was then deployed in the usual manner as described above. With proximal flow arrest in the right internal carotid artery, and constant aspiration applied at the hub of the balloon guide catheter and at the hub of the 55 aspiration catheter for approximately 2 minutes, the combination of the retrieval device, the microcatheter, and the 55 Zoom aspiration catheter were retrieved and removed. Again clot was seen entangled in the retrieval device. A control arteriogram performed through the balloon guide following flow reversal in the right internal carotid artery now demonstrated complete angiographic revascularization of the right internal carotid artery distally, and also the petrous, cavernous and supraclinoid segments. The right middle cerebral artery distribution demonstrated a TICI 3 revascularization. The right anterior cerebral artery remained widely patent. With the balloon guide catheter in the mid right internal carotid artery, an exchange micro guidewire was reintroduced with the distal end in the petrous horizontal segment. The balloon guide was then retrieved into the right common carotid artery. A diagnostic arteriogram performed through the balloon guide now demonstrated  patency of the right internal carotid proximally associated with caliber irregularity, and a flat smooth filling defect in its mid cervical segment. It was decided to proceed with placement of a 6/8 mm x 4 mm Xact stent. The stent delivery apparatus was prepped and purged with heparinized saline infusion. Thereafter, using the rapid exchange technique, the stent delivery system was advanced and positioned such that the distal marker was just distal to the smooth flat filling defect and proximal was just inside the right common carotid artery. The stent was then deployed without any difficulty. The delivery system was then retrieved and removed. A control arteriogram performed through the balloon guide in the right common carotid artery demonstrated excellent apposition with smooth contour of the internal carotid artery in the stented segment. A control arteriogram performed more distally demonstrated significantly improved caliber and flow through the internal carotid artery proximally and distally maintaining a TICI 3 revascularization of the right MCA distribution. Patient was given 81 mg of aspirin, and 180 mg of Brilinta via an orogastric tube prior to the angioplasty, and also was given 6 mg of Integrilin intra-arterially. Control arteriograms were then performed at 15 and 30 minutes post deployment of the stent. These continued to  demonstrate excellent flow through the stented segment, and also intracranially. Balloon guide was then retrieved and removed. Diagnostic catheter was then advanced to the left common carotid artery over a 0.035 inch Roadrunner guidewire. Arteriogram performed at this site demonstrated left internal carotid artery at the bulb to the cranial skull base to be widely patent. Mild to moderate fusiform dilatation was noted of the proximal cavernous segment. More distally the supraclinoid segments demonstrate wide patency. The left middle cerebral artery and the left anterior cerebral  artery opacify into the capillary and venous phases. Transient cross-filling via the anterior communicating artery of the right anterior cerebral A2 segment was noticeable. Balloon guide was then retrieved and removed. An 8 French Angio-Seal closure device was then applied for hemostasis at the right femoral puncture site. Distal pulses remained present in both feet unchanged. Post CT of the brain demonstrated blush in the right basal ganglia without mass effect or shift. The patient was left intubated awaiting the results of the COVID-19 test. 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 PLAN: Follow-up in the clinic 4 weeks post discharge. Electronically Signed   By: Julieanne Cotton M.D.   On: 01/26/2021 16:06   CT HEAD CODE STROKE WO CONTRAST  Result Date: 01/24/2021 CLINICAL DATA:  Code stroke.  Slurred speech and left-sided deficits EXAM: CT HEAD WITHOUT CONTRAST TECHNIQUE: Contiguous axial images were obtained from the base of the skull through the vertex without intravenous contrast. COMPARISON:  None. FINDINGS: Brain: There is no mass, hemorrhage or extra-axial collection. The size and configuration of the ventricles and extra-axial CSF spaces are normal. There is loss of gray-white differentiation within the right insula and mid MCA territory. Vascular: Hyperdense appearance of the right MCA. Skull: The visualized skull base, calvarium and extracranial soft tissues are normal. Sinuses/Orbits: No fluid levels or advanced mucosal thickening of the visualized paranasal sinuses. No mastoid or middle ear  effusion. The orbits are normal. ASPECTS [name] Stroke Program Early CT Score) - Ganglionic level infarction (caudate, lentiform nuclei, internal capsule, insula, M1-M3 cortex): 5 - Supraganglionic infarction (M4-M6 cortex): 2 Total score (0-10 with 10 being normal): 7 IMPRESSION: 1. 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 2. Hyperdense appearance of the right MCA. 3. ASPECTS is 7. These results were called by telephone at the time of interpretation on 01/24/2021 at 10:58 pm to provider Caryl Pina, who verbally acknowledged these results. Electronically Signed   By: Deatra Robinson M.D.   On: 01/24/2021 22:58   VAS US CAROTID  Result Date: 01/25/2021 Carotid Arterial Duplex Study Indications:       Right stent and Check post-op patency of RT ICA stent. Other Factors:     No medical history on file. Limitations        Today's exam was limited due to the post surgical status of                    the patient and tissue properties. Comparison Study:  No previous exams Performing Technologist: Ernestene Mention  Examination Guidelines: A complete evaluation includes B-mode imaging, spectral Doppler, color Doppler, and power Doppler as needed of all accessible portions of  each vessel. Bilateral testing is considered an integral part of a complete examination. Limited examinations for reoccurring indications Rhoda be performed as noted.  Right Carotid Findings: +----------+--------+--------+--------+------------------+------------------+           PSV cm/sEDV cm/sStenosisPlaque DescriptionComments           +----------+--------+--------+--------+------------------+------------------+ CCA Prox  91      14                                intimal thickening +----------+--------+--------+--------+------------------+------------------+ CCA Distal83      11              hypoechoic        intimal thickening +----------+--------+--------+--------+------------------+------------------+  ECA       108     0                                                    +----------+--------+--------+--------+------------------+------------------+ +----------+--------+-------+----------------+-------------------+           PSV cm/sEDV cmsDescribe        Arm Pressure (mmHG) +----------+--------+-------+----------------+-------------------+ [id number]            Multiphasic, WNL                    +----------+--------+-------+----------------+-------------------+ +---------+--------+--+--------+-+---------+ VertebralPSV cm/s48EDV cm/s7Antegrade +---------+--------+--+--------+-+---------+  Right Stent(s): +---------------+---+--+-------------++-----------------------------+ Prox to Stent  10918<50% stenosis                              +---------------+---+--+-------------++-----------------------------+ Proximal Stent 14220<50% stenosis                              +---------------+---+--+-------------++-----------------------------+ Mid Stent      12426<50% stenosis                              +---------------+---+--+-------------++-----------------------------+ Distal Stent   17531<50% stenosis                              +---------------+---+--+-------------++-----------------------------+ Distal to Stent13436<50% stenosisDifficult to see end of stent +---------------+---+--+-------------++-----------------------------+    Summary: Right Carotid: The extracranial vessels were near-normal with only minimal wall                thickening or plaque. The ICA stent appears patent in it's                entirety. Vertebrals:  Right vertebral artery demonstrates antegrade flow. Subclavians: Normal flow hemodynamics were seen in the right subclavian artery. *See table(s) above for measurements and observations.  Electronically signed by Coral Else MD on 01/25/2021 at 9:26:22 PM.    Final    CT ANGIO HEAD CODE STROKE  Result Date:  01/24/2021 CLINICAL DATA:  Left-sided deficits EXAM: CT ANGIOGRAPHY HEAD AND NECK CT PERFUSION BRAIN TECHNIQUE: Multidetector CT imaging of the head and neck was performed using the standard protocol during bolus administration of intravenous contrast. Multiplanar CT image reconstructions and MIPs were obtained to evaluate the vascular anatomy. Carotid stenosis measurements (when applicable) are obtained utilizing NASCET criteria, using the distal internal carotid diameter as the denominator. Multiphase CT imaging of  the brain was performed following IV bolus contrast injection. Subsequent parametric perfusion maps were calculated using RAPID software. CONTRAST:  OMNIPAQUE IOHEXOL 350 MG/ML SOLN COMPARISON:  None. FINDINGS: CTA NECK FINDINGS SKELETON: There is no bony spinal canal stenosis. No lytic or blastic lesion. OTHER NECK: Normal pharynx, larynx and major salivary glands. No cervical lymphadenopathy. Unremarkable thyroid gland. UPPER CHEST: No pneumothorax or pleural effusion. No nodules or masses. AORTIC ARCH: There is calcific atherosclerosis of the aortic arch. There is no aneurysm, dissection or hemodynamically significant stenosis of the visualized portion of the aorta. Conventional 3 vessel aortic branching pattern. The visualized proximal subclavian arteries are widely patent. 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 The right common and external carotid arteries are normal. LEFT CAROTID SYSTEM: No dissection, occlusion or aneurysm. Mild atherosclerotic calcification at the carotid bifurcation without hemodynamically significant stenosis. VERTEBRAL ARTERIES: Left dominant configuration. Both origins are clearly patent. There is no dissection, occlusion or flow-limiting stenosis to the skull base (V1-V3 segments). CTA HEAD FINDINGS POSTERIOR CIRCULATION: --Vertebral arteries: Normal V4 segments. --Inferior cerebellar arteries: Normal. --Basilar artery: Normal. --Superior  cerebellar arteries: Normal. --Posterior cerebral arteries (PCA): Normal. ANTERIOR CIRCULATION: --Intracranial internal carotid arteries: Normal. --Anterior cerebral arteries (ACA): Normal. Both A1 segments are present. Patent anterior communicating artery (a-comm). --Middle cerebral arteries (MCA): Complete occlusion of the right MCA at its origin. There is no collateral flow demonstrated. VENOUS SINUSES: As permitted by contrast timing, patent. ANATOMIC VARIANTS: None Review of the MIP images confirms the above findings. CT Brain Perfusion Findings: ASPECTS: 7 CBF (<30%) Volume: 68mL Perfusion (Tmax>6.0s) volume: 2 L43mL Mismatch Volume: Infarction Location:Right MCA territory IMPRESSION: 1. 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 2. 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 3. 68 mL right MCA territory core infarct with 133 mL area of ischemic penumbra. Critical Value/emergent results were called by telephone at the time of interpretation on 01/24/2021 at 11:24 pm to provider [facility] , who verbally acknowledged these results. Aortic Atherosclerosis (ICD10-I70.0). Electronically Signed   By: Deatra Robinson M.D.   On: 01/24/2021 23:34   CT ANGIO NECK CODE STROKE  Result Date: 01/24/2021 CLINICAL DATA:  Left-sided deficits EXAM: CT ANGIOGRAPHY HEAD AND NECK CT PERFUSION BRAIN TECHNIQUE: Multidetector CT imaging of the head and neck was performed using the standard protocol during bolus administration of intravenous contrast. Multiplanar CT image reconstructions and MIPs were obtained to evaluate the vascular anatomy. Carotid stenosis measurements (when applicable) are obtained utilizing NASCET criteria, using the distal internal carotid diameter as the denominator. Multiphase CT imaging of the brain was performed following IV bolus contrast injection. Subsequent parametric perfusion maps were  calculated using RAPID software. CONTRAST:  OMNIPAQUE IOHEXOL 350 MG/ML SOLN COMPARISON:  None. FINDINGS: CTA NECK FINDINGS SKELETON: There is no bony spinal canal stenosis. No lytic or blastic lesion. OTHER NECK: Normal pharynx, larynx and major salivary glands. No cervical lymphadenopathy. Unremarkable thyroid gland. UPPER CHEST: No pneumothorax or pleural effusion. No nodules or masses. AORTIC ARCH: There is calcific atherosclerosis of the aortic arch. There is no aneurysm, dissection or hemodynamically significant stenosis of the visualized portion of the aorta. Conventional 3 vessel aortic branching pattern. The visualized proximal subclavian arteries are widely patent. 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 The right common and external carotid arteries are normal. LEFT CAROTID SYSTEM: No dissection, occlusion or aneurysm. Mild  atherosclerotic calcification at the carotid bifurcation without hemodynamically significant stenosis. VERTEBRAL ARTERIES: Left dominant configuration. Both origins are clearly patent. There is no dissection, occlusion or flow-limiting stenosis to the skull base (V1-V3 segments). CTA HEAD FINDINGS POSTERIOR CIRCULATION: --Vertebral arteries: Normal V4 segments. --Inferior cerebellar arteries: Normal. --Basilar artery: Normal. --Superior cerebellar arteries: Normal. --Posterior cerebral arteries (PCA): Normal. ANTERIOR CIRCULATION: --Intracranial internal carotid arteries: Normal. --Anterior cerebral arteries (ACA): Normal. Both A1 segments are present. Patent anterior communicating artery (a-comm). --Middle cerebral arteries (MCA): Complete occlusion of the right MCA at its origin. There is no collateral flow demonstrated. VENOUS SINUSES: As permitted by contrast timing, patent. ANATOMIC VARIANTS: None Review of the MIP images confirms the above findings. CT Brain Perfusion Findings: ASPECTS: 7 CBF (<30%) Volume: 68mL Perfusion (Tmax>6.0s) volume: 2 L50mL  Mismatch Volume: Infarction Location:Right MCA territory IMPRESSION: 1. 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 2. 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 3. 68 mL right MCA territory core infarct with 133 mL area of ischemic penumbra. Critical Value/emergent results were called by telephone at the time of interpretation on 01/24/2021 at 11:24 pm to provider [facility] , who verbally acknowledged these results. Aortic Atherosclerosis (ICD10-I70.0). Electronically Signed   By: Deatra Robinson M.D.   On: 01/24/2021 23:34   IR ANGIO INTRA EXTRACRAN SEL COM CAROTID INNOMINATE UNI L MOD SED  Result Date: 01/29/2021 INDICATION: Left sided hemiplegia, right gaze deviation and neglect on left-side. Occluded right internal carotid artery proximally, and right middle cerebral artery and the right anterior cerebral artery. EXAM: 1. EMERGENT LARGE VESSEL OCCLUSION THROMBOLYSIS (anterior CIRCULATION) COMPARISON:  CT angiogram of the head and neck of January 24, 2021. MEDICATIONS: Ancef 2 g IV antibiotic was administered within 1 hour of the procedure. ANESTHESIA/SEDATION: General anesthesia. CONTRAST:  Isovue 300 approximately 160 mL. FLUOROSCOPY TIME:  Fluoroscopy Time: 96 minutes 18 seconds (3550 mGy). COMPLICATIONS: None immediate. TECHNIQUE: Following a full explanation of the procedure along with the potential associated complications, an informed witnessed consent was obtained. The risks of intracranial hemorrhage of 10%, worsening neurological deficit, ventilator dependency, death and inability to revascularize were all reviewed in detail with the patient's spouse. The patient was then put under general anesthesia by the Department of Anesthesiology at [name]. The right groin was prepped and draped in the usual sterile fashion. Thereafter using modified Seldinger technique, transfemoral  access into the right common femoral artery was obtained without difficulty. Over a 0.035 inch guidewire an 8 [place] Pinnacle 25 cm sheath was inserted. Through this, and also over a 0.035 inch guidewire a 5 [place] JB 1 catheter was advanced to the aortic arch region and selectively positioned in the right common carotid artery. FINDINGS: The right common carotid arteriogram demonstrates the right external carotid artery and its major branches to be widely patent. 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 More distally no reconstitution of the right internal carotid artery is seen to the cranial skull base from the external carotid artery branches. PROCEDURE: Over a 0.035 inch 300 cm Rosen exchange guidewire an 087 balloon guide catheter which had been prepped with 50% contrast and 50% heparinized saline infusion was advanced and positioned just proximal to the right common carotid bifurcation. The guidewire was removed. Good aspiration obtained from the hub of the balloon guide catheter. A gentle control arteriogram performed through this continued  to demonstrate the stump of the occluded right internal carotid artery. Over a 0.014 inch standard Synchro micro guidewire with a mild J configuration an 021 160 cm Trevo ProVue microcatheter was advanced without difficulty to the horizontal petrous right ICA. The guidewire was removed. Good aspiration obtained from the hub of the microcatheter. A gentle control arteriogram performed through the microcatheter demonstrated opacification of the right internal carotid artery distally with stasis. The microcatheter was then exchanged for an 014 inch 300 cm Transend EX micro guidewire with a mild J configuration. A 4 mm x 30 mm Viatrac angioplasty balloon catheter which had been prepped and purged with heparinized saline infusion was advanced using the rapid exchange technique and positioned with its markers optimally in the  proximal right internal carotid artery. A control inflation was then performed via a micro inflation syringe device via micro tubing. 4 mm diameter level for approximately 60 seconds. Thereafter, balloon was deflated and retrieved and removed. A control arteriogram was performed through the balloon catheter in the right internal carotid artery proximally now demonstrated modestly increased flow more distally to the horizontal petrous segment. The combination of an 021 microcatheter, and the 136 cm 55 Zoom aspiration catheter was advanced to the distal end of the exchange micro guidewire. The micro guidewire was removed. Good aspiration obtained from the hub of the Zoom aspiration catheter. A gentle control arteriogram performed through the Zoom aspiration catheter now demonstrated opacification of the right internal carotid artery in the distal cervical petrous and caval cavernous segment. Complete occlusion with multiple filling defects was demonstrable in the origin of the ophthalmic artery. An 014 inch standard Synchro micro guidewire was reintroduced into the microcatheter, and advanced without difficulty through the occluded distal cavernous supraclinoid right ICA into the right middle cerebral artery inferior division in the M2 M3 region followed by the microcatheter. The guidewire was removed. Good aspiration was obtained from the hub of the microcatheter. Gentle control arteriogram performed through the microcatheter demonstrated safe position of the tip of the microcatheter which was then connected to continuous heparinized saline infusion. A 6.5 mm x 47 mm retrieval device was then advanced to the distal end of the microcatheter. The retrieval was then deployed. The Zoom aspiration catheter was then advanced into the occluded M1 segment. With proximal flow arrest in the right common carotid artery, and constant aspiration being applied the hub of the Zoom aspiration catheter, and the 55 aspiration catheter  for approximately 2-1/2 minutes, the combination of the retrieval device, the microcatheter, and the Zoom aspiration catheter were retrieved and removed. Copious amount of clot were seen entangled in the retrieval device and in the canister. Arteriogram performed following reversal of flow arrest, demonstrated revascularization of the inferior division of the right middle cerebral artery with persistent occlusion of the superior division achieving a TICI 2b revascularization. With the balloon guide in the proximal right internal carotid artery, the combination of the 55 Zoom aspiration catheter, the 021 microcatheter over a 0.014 inch standard Synchro micro guidewire was advanced to the supraclinoid right ICA. Using a torque device, access was obtained into the superior division M2 M3 region followed by the advancement of the microcatheter. The micro guidewire was removed. Good aspiration obtained from the hub of the microcatheter. A gentle control arteriogram performed through the microcatheter demonstrated safe position of the tip of the microcatheter. The Zoom aspiration catheter was advanced to just at the origin of the superior division. A 6.5 mm x 47 mm Embotrap retrieval device  was then deployed in the usual manner as described above. With proximal flow arrest in the right internal carotid artery, and constant aspiration applied at the hub of the balloon guide catheter and at the hub of the 55 aspiration catheter for approximately 2 minutes, the combination of the retrieval device, the microcatheter, and the 55 Zoom aspiration catheter were retrieved and removed. Again clot was seen entangled in the retrieval device. A control arteriogram performed through the balloon guide following flow reversal in the right internal carotid artery now demonstrated complete angiographic revascularization of the right internal carotid artery distally, and also the petrous, cavernous and supraclinoid segments. The right  middle cerebral artery distribution demonstrated a TICI 3 revascularization. The right anterior cerebral artery remained widely patent. With the balloon guide catheter in the mid right internal carotid artery, an exchange micro guidewire was reintroduced with the distal end in the petrous horizontal segment. The balloon guide was then retrieved into the right common carotid artery. A diagnostic arteriogram performed through the balloon guide now demonstrated patency of the right internal carotid proximally associated with caliber irregularity, and a flat smooth filling defect in its mid cervical segment. It was decided to proceed with placement of a 6/8 mm x 4 mm Xact stent. The stent delivery apparatus was prepped and purged with heparinized saline infusion. Thereafter, using the rapid exchange technique, the stent delivery system was advanced and positioned such that the distal marker was just distal to the smooth flat filling defect and proximal was just inside the right common carotid artery. The stent was then deployed without any difficulty. The delivery system was then retrieved and removed. A control arteriogram performed through the balloon guide in the right common carotid artery demonstrated excellent apposition with smooth contour of the internal carotid artery in the stented segment. A control arteriogram performed more distally demonstrated significantly improved caliber and flow through the internal carotid artery proximally and distally maintaining a TICI 3 revascularization of the right MCA distribution. Patient was given 81 mg of aspirin, and 180 mg of Brilinta via an orogastric tube prior to the angioplasty, and also was given 6 mg of Integrilin intra-arterially. Control arteriograms were then performed at 15 and 30 minutes post deployment of the stent. These continued to demonstrate excellent flow through the stented segment, and also intracranially. Balloon guide was then retrieved and removed.  Diagnostic catheter was then advanced to the left common carotid artery over a 0.035 inch Roadrunner guidewire. Arteriogram performed at this site demonstrated left internal carotid artery at the bulb to the cranial skull base to be widely patent. Mild to moderate fusiform dilatation was noted of the proximal cavernous segment. More distally the supraclinoid segments demonstrate wide patency. The left middle cerebral artery and the left anterior cerebral artery opacify into the capillary and venous phases. Transient cross-filling via the anterior communicating artery of the right anterior cerebral A2 segment was noticeable. Balloon guide was then retrieved and removed. An 8 French Angio-Seal closure device was then applied for hemostasis at the right femoral puncture site. Distal pulses remained present in both feet unchanged. Post CT of the brain demonstrated blush in the right basal ganglia without mass effect or shift. The patient was left intubated awaiting the results of the COVID-19 test. 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 PLAN: Follow-up in the clinic 4 weeks post discharge. Electronically Signed   By: Julieanne Cotton M.D.   On: 01/26/2021 16:06     TODAY-DAY OF DISCHARGE:  Subjective:   Leonard [name] today has no headache,no chest abdominal pain,no new weakness tingling or numbness, feels much better wants to go home today.   Objective:   Blood pressure 129/70, pulse 93, temperature 97.7 F (36.5 C), temperature source Oral, resp. rate 19, height 5\' 9"  (1.753 m), weight 77.5 kg, SpO2  97 %.  Intake/Output Summary (Last 24 hours) at 02/15/2021 1148 Last data filed at 02/14/2021 1827 Gross per 24 hour  Intake --  Output 500 ml  Net -500 ml   Filed Weights   02/13/21 0456 02/14/21 0419 02/15/21 0435  Weight: 75.8 kg 76 kg 77.5 kg    Exam: Awake Alert, Oriented *3, No new F.N deficits, Normal affect [place].AT,PERRAL Supple Neck,No JVD, No cervical lymphadenopathy appriciated.  Symmetrical Chest wall movement, Good air movement bilaterally, CTAB RRR,No Gallops,Rubs or new Murmurs, No Parasternal Heave +ve B.Sounds, Abd Soft, Non tender, No organomegaly appriciated, No rebound -guarding or rigidity. No Cyanosis, Clubbing or edema, No new Rash or bruise   PERTINENT RADIOLOGIC STUDIES: DG Swallowing Func-Speech Pathology  Result Date: 02/14/2021 Objective Swallowing Evaluation: Type of Study: MBS-Modified Barium Swallow Study  Patient Details Name: Chibueze Beasley Sprenkle MRN: [id number] Date of Birth: [birth year] Today's Date: 02/14/2021 Time: SLP Start Time (ACUTE ONLY): 1354 -SLP Stop Time (ACUTE ONLY): 1418 SLP Time Calculation (min) (ACUTE ONLY): 24.88 min Past Medical History: No past medical history on file. Past Surgical History: Past Surgical History: Procedure Laterality Date . IR ANGIO INTRA EXTRACRAN SEL COM CAROTID INNOMINATE UNI L MOD SED  01/25/2021 . IR CT HEAD LTD  01/25/2021 . IR CT HEAD LTD  01/25/2021 . IR PERCUTANEOUS ART THROMBECTOMY/INFUSION INTRACRANIAL INC DIAG ANGIO  01/25/2021 . RADIOLOGY WITH ANESTHESIA N/A 01/24/2021  Procedure: IR WITH ANESTHESIA;  Surgeon: Julieanne Cotton, MD;  Location: [facility] OR;  Service: Radiology;  Laterality: N/A; HPI: Pt is a 65 y.o. male with no significant PMHx who presented via EMS as a code stroke for acute onset of left hemiplegia, left facial droop, dysarthria and left hemineglect. NIHSS was 18 on admission and tPA was given. 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 Pt s/p  s/p bilateral common carotid arteriograms followed by right MCA/ACA/ICA thrombus retrieval with stent angioplasty 4/21. Pt was intubated for procedure. CT head 4/22: Large area of hypoattenuation at the site of known right MCA territory infarct. Small focus of parenchymal hyperdensity at the posterior aspect of the infarct site [place] indicate a small amount of petechial hemorrhage or contrast extravasation from procedure. Small amount of subarachnoid hyperdensity over the right frontal convexity, either subarachnoid blood or contrast taining. MBS 4/26: a moderate oral and moderate to severe pharyngeal dysphagia with sensory and motor deficits of neurogenic etiology.  Subjective: -- (alert) Assessment / Plan / Recommendation CHL IP CLINICAL IMPRESSIONS 02/14/2021 Clinical Impression Pt presents with improved oropharyngeal function compared to that noted during the last MBS on 01/30/21. Pt demonstrated oropharyngeal dysphagia characterized by reduced labial seal and stripping, weak manipulation, impaired bolus propulsion, reduced bolus cohesion, a pharyngeal delay, premature spillage to the valleculae with intermittent spillover to the pyriform sinuses, reduced anterior laryngeal  movement, and reduced pharyngeal constriction. He demonstrated anterior spillage to the left, residue in the left lateral sulcus, difficulty with A-P transport, vallecular residue, pyriform sinuse residue, and posterior pharyngeal wall residue. He inermittently demonstrated incomplete epiglottic inversion which facilitated penetration (PAS 3) and occasional aspiration (PAS 7) of thin liquids and once with a dual consistency bolus (i.e., nectar thick liquids with masticated regular textures). Pt's cough was inconsistently delayed and variability in effectiveness was also noted. No functional benefit was noted with pt's independent use of a posterior head tilt to facilitate A-P transport of solids. Regular textures, a barium  tablet, and dysphagia 3 solids were ultimately removed from the oral cavity after multiple unsuccessful attempts at A-P transport. Amount of pharyngeal residue increased with advancement of solids, but was improved with a liquid wash and independent secondary swallows. A dysphagia 2 diet with nectar thick liquids will be initiated at this time. SLP is hopeful that G-tube placement can be avoided if p.o. intake is tolerated and quantity is adequate. Pt's results and recommendations were discussed with Dr. Jerral [name] and Leonard [name], [facility]; it was agreed that pt's Cortrak will be removed, and that G-tube placement will reassessed on Monday. SLP will continue to follow pt for treatment. SLP Visit Diagnosis Dysphagia, oropharyngeal phase (R13.12) Attention and concentration deficit following -- Frontal lobe and executive function deficit following -- Impact on safety and function --   CHL IP TREATMENT RECOMMENDATION 02/14/2021 Treatment Recommendations Therapy as outlined in treatment plan below   Prognosis 02/14/2021 Prognosis for Safe Diet Advancement Good Barriers to Reach Goals Cognitive deficits;Severity of deficits Barriers/Prognosis Comment -- CHL IP DIET RECOMMENDATION 02/14/2021 SLP Diet Recommendations Dysphagia 2 (Fine chop) solids;Nectar thick liquid Liquid Administration via Cup;No straw Medication Administration Crushed with puree Compensations Slow rate;Small sips/bites;Multiple dry swallows after each bite/sip;Follow solids with liquid Postural Changes Seated upright at 90 degrees   CHL IP OTHER RECOMMENDATIONS 02/14/2021 Recommended Consults -- Oral Care Recommendations Oral care BID Other Recommendations Order thickener from pharmacy   CHL IP FOLLOW UP RECOMMENDATIONS 02/14/2021 Follow up Recommendations Inpatient Rehab   CHL IP FREQUENCY AND DURATION 02/14/2021 Speech Therapy Frequency (ACUTE ONLY) min 2x/week Treatment Duration 2 weeks      CHL IP ORAL PHASE 02/14/2021 Oral Phase Impaired Oral - Pudding  Teaspoon -- Oral - Pudding Cup -- Oral - Honey Teaspoon -- Oral - Honey Cup Weak lingual manipulation;Lingual/palatal residue;Decreased bolus cohesion;Premature spillage;Reduced posterior propulsion Oral - Nectar Teaspoon -- Oral - Nectar Cup Weak lingual manipulation;Lingual/palatal residue;Decreased bolus cohesion;Premature spillage;Reduced posterior propulsion;Left anterior bolus loss Oral - Nectar Straw Weak lingual manipulation;Lingual/palatal residue;Decreased bolus cohesion;Premature spillage;Reduced posterior propulsion;Left anterior bolus loss Oral - Thin Teaspoon -- Oral - Thin Cup Weak lingual manipulation;Lingual/palatal residue;Decreased bolus cohesion;Premature spillage;Reduced posterior propulsion;Left anterior bolus loss Oral - Thin Straw Weak lingual manipulation;Lingual/palatal residue;Decreased bolus cohesion;Premature spillage;Reduced posterior propulsion;Left anterior bolus loss Oral - Puree Weak lingual manipulation;Lingual/palatal residue;Decreased bolus cohesion;Reduced posterior propulsion;Left anterior bolus loss Oral - Mech Soft Weak lingual manipulation;Lingual/palatal residue;Reduced posterior propulsion Oral - Regular Weak lingual manipulation;Lingual/palatal residue;Reduced posterior propulsion Oral - Multi-Consistency -- Oral - Pill Weak lingual manipulation;Lingual/palatal residue;Reduced posterior propulsion Oral Phase - Comment --  CHL IP PHARYNGEAL PHASE 02/14/2021 Pharyngeal Phase Impaired Pharyngeal- Pudding Teaspoon -- Pharyngeal -- Pharyngeal- Pudding Cup -- Pharyngeal -- Pharyngeal- Honey Teaspoon -- Pharyngeal -- Pharyngeal- Honey Cup Reduced epiglottic inversion;Reduced anterior laryngeal mobility;Pharyngeal residue - valleculae;Pharyngeal residue - pyriform Pharyngeal -- Pharyngeal- Nectar Teaspoon -- Pharyngeal -- Pharyngeal- Nectar Cup Reduced epiglottic inversion;Reduced anterior laryngeal mobility;Pharyngeal  residue - valleculae;Pharyngeal residue -  pyriform;Penetration/Aspiration during swallow Pharyngeal Material enters airway, remains ABOVE vocal cords then ejected out Pharyngeal- Nectar Straw Reduced epiglottic inversion;Reduced anterior laryngeal mobility;Pharyngeal residue - valleculae;Pharyngeal residue - pyriform;Penetration/Aspiration during swallow Pharyngeal Material enters airway, remains ABOVE vocal cords and not ejected out;Material enters airway, passes BELOW cords and not ejected out despite cough attempt by patient Pharyngeal- Thin Teaspoon -- Pharyngeal -- Pharyngeal- Thin Cup Reduced epiglottic inversion;Reduced anterior laryngeal mobility;Pharyngeal residue - valleculae;Pharyngeal residue - pyriform;Penetration/Aspiration during swallow Pharyngeal Material enters airway, passes BELOW cords and not ejected out despite cough attempt by patient Pharyngeal- Thin Straw Reduced epiglottic inversion;Reduced anterior laryngeal mobility;Pharyngeal residue - valleculae;Pharyngeal residue - pyriform;Penetration/Aspiration during swallow Pharyngeal Material enters airway, passes BELOW cords and not ejected out despite cough attempt by patient Pharyngeal- Puree Reduced epiglottic inversion;Reduced anterior laryngeal mobility;Pharyngeal residue - valleculae;Pharyngeal residue - pyriform Pharyngeal -- Pharyngeal- Mechanical Soft -- Pharyngeal -- Pharyngeal- Regular -- Pharyngeal -- Pharyngeal- Multi-consistency -- Pharyngeal -- Pharyngeal- Pill -- Pharyngeal -- Pharyngeal Comment --  CHL IP CERVICAL ESOPHAGEAL PHASE 02/14/2021 Cervical Esophageal Phase WFL Pudding Teaspoon -- Pudding Cup -- Honey Teaspoon -- Honey Cup WFL Nectar Teaspoon -- Nectar Cup -- Nectar Straw -- Thin Teaspoon -- Thin Cup -- Thin Straw -- Puree -- Mechanical Soft -- Regular -- Multi-consistency -- Pill -- Cervical Esophageal Comment -- Leonard [name] 02/14/2021, 3:47 PM                PERTINENT LAB RESULTS: CBC: Recent Labs    02/14/21 0256 02/15/21 0413  WBC 4.0 3.3*   HGB 12.0* 11.4*  HCT 37.2* 35.5*  PLT 288 244   CMET CMP     Component Value Date/Time   NA 134 (L) 02/15/2021 0413   K 4.5 02/15/2021 0413   CL 102 02/15/2021 0413   CO2 26 02/15/2021 0413   GLUCOSE 108 (H) 02/15/2021 0413   BUN 15 02/15/2021 0413   CREATININE 0.82 02/15/2021 0413   CALCIUM 8.3 (L) 02/15/2021 0413   PROT 5.2 (L) 02/08/2021 0347   ALBUMIN 2.4 (L) 02/08/2021 0347   AST 63 (H) 02/08/2021 0347   ALT 94 (H) 02/08/2021 0347   ALKPHOS 100 02/08/2021 0347   BILITOT 1.0 02/08/2021 0347   GFRNONAA >60 02/15/2021 0413    GFR Estimated Creatinine Clearance: 89.8 mL/min (by C-G formula based on SCr of 0.82 mg/dL). No results for input(s): LIPASE, AMYLASE in the last 72 hours. No results for input(s): CKTOTAL, CKMB, CKMBINDEX, TROPONINI in the last 72 hours. Invalid input(s): POCBNP No results for input(s): DDIMER in the last 72 hours. No results for input(s): HGBA1C in the last 72 hours. No results for input(s): CHOL, HDL, LDLCALC, TRIG, CHOLHDL, LDLDIRECT in the last 72 hours. No results for input(s): TSH, T4TOTAL, T3FREE, THYROIDAB in the last 72 hours.  Invalid input(s): FREET3 No results for input(s): VITAMINB12, FOLATE, FERRITIN, TIBC, IRON, RETICCTPCT in the last 72 hours. Coags: No results for input(s): INR in the last 72 hours.  Invalid input(s): PT Microbiology: No results found for this or any previous visit (from the past 240 hour(s)).  FURTHER DISCHARGE INSTRUCTIONS:  Get Medicines reviewed and adjusted: Please take all your medications with you for your next visit with your Primary MD  Laboratory/radiological data: Please request your Primary MD to go over all [name] tests and procedure/radiological results at the follow up, please ask your Primary MD to get all [name] records sent to his/her office.  In some cases, they will be blood  work, cultures and biopsy results pending at the time of your discharge. Please request that your primary  care M.D. goes through all the records of your [name] data and follows up on these results.  Also Note the following: If you experience worsening of your admission symptoms, develop shortness of breath, life threatening emergency, suicidal or homicidal thoughts you must seek medical attention immediately by calling 911 or calling your MD immediately  if symptoms less severe.  You must read complete instructions/literature along with all the possible adverse reactions/side effects for all the Medicines you take and that have been prescribed to you. Take any new Medicines after you have completely understood and accpet all the possible adverse reactions/side effects.   Do not drive when taking Pain medications or sleeping medications (Benzodaizepines)  Do not take more than prescribed Pain, Sleep and Anxiety Medications. It is not advisable to combine anxiety,sleep and pain medications without talking with your primary care practitioner  Special Instructions: If you have smoked or chewed Tobacco  in the last 2 yrs please stop smoking, stop any regular Alcohol  and or any Recreational drug use.  Wear Seat belts while driving.  Please note: You were cared for by a hospitalist during your [name] stay. Once you are discharged, your primary care physician will handle any further medical issues. Please note that NO REFILLS for any discharge medications will be authorized once you are discharged, as it is imperative that you return to your primary care physician (or establish a relationship with a primary care physician if you do not have one) for your post [name] discharge needs so that they can reassess your need for medications and monitor your lab values.  Total Time spent coordinating discharge including counseling, education and face to face time equals 35 minutes  Signed: Jeoffrey Massed 02/15/2021 11:48 AM

## 2021-02-15 NOTE — Plan of Care (Signed)
  Problem: Education: Goal: Knowledge of disease or condition will improve Outcome: Progressing Goal: Knowledge of secondary prevention will improve Outcome: Progressing Goal: Knowledge of patient specific risk factors addressed and post discharge goals established will improve Outcome: Progressing Goal: Individualized Educational Video(s) Outcome: Progressing   Problem: Education: Goal: Knowledge of General Education information will improve Description: Including pain rating scale, medication(s)/side effects and non-pharmacologic comfort measures Outcome: Progressing   Problem: Health Behavior/Discharge Planning: Goal: Ability to manage health-related needs will improve Outcome: Progressing   Problem: Clinical Measurements: Goal: Ability to maintain clinical measurements within normal limits will improve Outcome: Progressing Goal: Will remain free from infection Outcome: Progressing Goal: Diagnostic test results will improve Outcome: Progressing Goal: Respiratory complications will improve Outcome: Progressing Goal: Cardiovascular complication will be avoided Outcome: Progressing   Problem: Activity: Goal: Risk for activity intolerance will decrease Outcome: Progressing   Problem: Nutrition: Goal: Adequate nutrition will be maintained Outcome: Progressing   Problem: Coping: Goal: Level of anxiety will decrease Outcome: Progressing   Problem: Elimination: Goal: Will not experience complications related to bowel motility Outcome: Progressing Goal: Will not experience complications related to urinary retention Outcome: Progressing   Problem: Pain Managment: Goal: General experience of comfort will improve Outcome: Progressing   Problem: Safety: Goal: Ability to remain free from injury will improve Outcome: Progressing   Problem: Skin Integrity: Goal: Risk for impaired skin integrity will decrease Outcome: Progressing   

## 2021-02-15 NOTE — Progress Notes (Signed)
SLP Cancellation Note  Patient Details Name: Leonard [name] MRN: [id number] DOB: 19-Stannard-1957   Cancelled treatment:       Reason Eval/Treat Not Completed: Patient at procedure or test/unavailable (Pt currently being cleaned by tech. SLP will follow up as schedule allows.)  Fayette Hamada I. Vear Clock, MS, CCC-SLP Acute Rehabilitation Services Office number [phone] Pager [phone]  Scheryl Marten 02/15/2021, 10:06 AM

## 2021-02-15 NOTE — Progress Notes (Addendum)
Patient arrived and oriented to unit. No distressed, alert and oriented to 3, resting with call bell in place.  Foley removed at 1648 per order.

## 2021-02-15 NOTE — Progress Notes (Signed)
Leonard Fennel, MD  Physician  Physical Medicine and Rehabilitation  Consult Note     Signed  Date of Service:  01/26/2021 2:32 PM      Related encounter: ED to Hosp-Admission (Discharged) from 01/24/2021 in [facility]       Signed      Expand All Collapse All     Show:Clear all [x] Manual[x] Template[] Copied  Added by: [x] Angiulli, , PA-C[x] , MD   [] Hover for details       Physical Medicine and Rehabilitation Consult Reason for Consult: Left-sided weakness and facial droop with dysarthria Referring Physician: Dr.Xu  HPI: Leonard [name] is a 65 y.o. right-handed male with unremarkable past medical history.  History taken from chart review due to lethargy.  Patient lives with spouse independent prior to admission. Adult son lives nearby.  1 level home 4 steps to entry.  PT presented on 01/24/2021 with acute left hemiparesis, dysarthria, and facial droop.  Noted blood pressure severely elevated at 232/124.  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  Patient underwent revascularization per interventional radiology.  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  No hemorrhage or mass-effect.  Punctate foci of acute ischemia in the right temporal lobe and right thalamus.  MRA unremarkable.  Echocardiogram with ejection fraction of 60 5%, no wall motion abnormalities.  Currently maintained on aspirin for CVA prophylaxis as well as Brilinta.  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  Patient is n.p.o. with alternative means of nutritional support.  Cleviprex  initiated for blood pressure control.  Therapy evaluations completed due to patient decreased functional ability left hemiparesis recommendations of physical medicine rehab consult.  Review of Systems  Unable to perform ROS: Mental acuity   PMH: None      Past Surgical History:  Procedure Laterality Date  . RADIOLOGY WITH ANESTHESIA N/A 01/24/2021   Procedure: IR WITH ANESTHESIA;  Surgeon: 01/26/2021, MD;  Location: [facility] OR;  Service: Radiology;  Laterality: N/A;   No pertinent family history of premature CVA Social History:  has no history on file for tobacco use, alcohol use, and drug use.,  Unable to obtain from patient Allergies: No Known Allergies       Medications Prior to Admission  Medication Sig Dispense Refill  . acetaminophen (TYLENOL) 500 MG tablet Take 1,000 mg by mouth every 6 (six) hours as needed for mild pain.    01/27/2021 aspirin EC 81 MG tablet Take 81 mg by mouth daily. Swallow whole.      Home: Home Living Family/patient expects to be discharged to:: Private residence Living Arrangements: Spouse/significant other Available Help at Discharge: Family,Available 24 hours/day Type of Home: House Home Access: Stairs to enter Julieanne Cotton of Steps: 4-5 Entrance Stairs-Rails: None Home Layout: One level Bathroom Shower/Tub: Marland Kitchen: Standard Home Equipment: None Additional Comments: Wife works at a [phone]. Adult son lives nearby.  Lives With: Spouse  Functional History: Prior Function Level of Independence: Independent Comments: Pt is retired but stays busy. Pt used to work as a [employer]. Functional Status:  Mobility: Bed Mobility Overal bed mobility: Needs Assistance Bed Mobility:  Supine to Sit,Sit to Supine Supine to sit: Mod assist,+2 for physical assistance,+2 for safety/equipment Sit to supine: Max assist,+2 for physical assistance,+2 for safety/equipment General bed mobility comments: Provided  verbal and tactile cues to bring legs to L EOB, but pt pulled legs posteriorly and began to bring them off R EOB instead, needing assistance to block and redirect the legs and bring off L side. After extended period, pt did initiate transition of trunk to sit, but needed modAx2 to complete and steady. Transfers Overall transfer level: Needs assistance Equipment used: 2 person hand held assist Transfers: Sit to/from Stand Sit to Stand: Min assist,+2 physical assistance,+2 safety/equipment General transfer comment: Pt impulsively coming to stand multiple times throughout session with bil HHA minAx2 to steady. Ambulation/Gait Ambulation/Gait assistance: Min assist,+2 physical assistance,+2 safety/equipment Gait Distance (Feet): 10 Feet (~5 ft anteriorly then ~5 ft posteriorly) Assistive device: 2 person hand held assist Gait Pattern/deviations: Step-through pattern,Decreased step length - left,Decreased step length - right,Decreased stride length,Decreased dorsiflexion - left,Shuffle,Trunk flexed,Decreased stance time - left,Decreased weight shift to left General Gait Details: Pt with R arm around therapist on one side and HHA on L side for steadying with minAx2. Pt benefiting from visual cues to step on therapist's foot to progress gait and step length. Pt ambulates slowly and unsteadily with short, shuffling steps, especially with the L. Decreased L stance time and weight shift also, needing cues to facilitate at times. Gait velocity: reduced Gait velocity interpretation: <1.31 ft/sec, indicative of household ambulator  ADL:  Cognition: Cognition Overall Cognitive Status: Impaired/Different from baseline Arousal/Alertness: Lethargic Orientation Level: Oriented to person Cognition Arousal/Alertness: Lethargic Behavior During Therapy: Impulsive,Flat affect Overall Cognitive Status: Impaired/Different from baseline Area of Impairment: Attention,Memory,Following  commands,Safety/judgement,Awareness,Problem solving Current Attention Level: Selective Memory: Decreased short-term memory Following Commands: Follows one step commands inconsistently,Follows one step commands with increased time Safety/Judgement: Decreased awareness of safety,Decreased awareness of deficits Awareness: Intellectual Problem Solving: Slow processing,Decreased initiation,Difficulty sequencing,Requires verbal cues,Requires tactile cues General Comments: Pt with flat affect but likes to joke around with therapists, has a good sense of humor. Pt needing extra time to process and respond to cues for L limb movements, but inconsistent on whether he responds or not. Pt able to rotate head to L but not cross midline with eyes to look to L and displays some L side neglect. Pt with poor spatial awareness, initially trying to exit legs on R side of bed despite max cues to go to L, needing physical directing to correct. As pt fatigued, his awareness decreased as he would repeatedly transfer sit <> stand on EOB impulsively and then indicate he was going to walk anteriorly to go to his bed even though he was already at it.  Blood pressure 137/70, pulse 76, temperature 99 F (37.2 C), temperature source Axillary, resp. rate (!) 25, height  (1.753 m), weight 82.6 kg, SpO2 95 %. Physical Exam Vitals reviewed.  Constitutional:      General: He is not in acute distress.    Appearance: He is normal weight. He is not ill-appearing.     Comments: Lethargic  HENT:     Head: Normocephalic and atraumatic.     Comments: + NG    Nose: Nose normal.  Eyes:     General:        Right eye: No discharge.        Left eye: No discharge.     Comments: Will not open eyes, including to painful stimuli  Cardiovascular:  Rate and Rhythm: Normal rate and regular rhythm.  Pulmonary:     Effort: Pulmonary effort is normal. No respiratory distress.     Breath sounds: No stridor.  Abdominal:     General:  Abdomen is flat. Bowel sounds are normal. There is no distension.  Musculoskeletal:     Cervical back: Normal range of motion and neck supple.     Comments: No edema or tenderness in extremities  Skin:    General: Skin is warm and dry.  Neurological:     Comments: Lethargic Motor exam limited due to lethargy, but seen spontaneously moving right upper extremity  Psychiatric:     Comments: Unable to assess due to lethargy     Lab Results Last 24 Hours       Results for orders placed or performed during the hospital encounter of 01/24/21 (from the past 24 hour(s))  Glucose, capillary     Status: Abnormal   Collection Time: 01/25/21  7:45 PM  Result Value Ref Range   Glucose-Capillary 180 (H) 70 - 99 mg/dL  Glucose, capillary     Status: Abnormal   Collection Time: 01/26/21 12:19 AM  Result Value Ref Range   Glucose-Capillary 167 (H) 70 - 99 mg/dL  Glucose, capillary     Status: Abnormal   Collection Time: 01/26/21  3:11 AM  Result Value Ref Range   Glucose-Capillary 129 (H) 70 - 99 mg/dL  Triglycerides     Status: None   Collection Time: 01/26/21  4:41 AM  Result Value Ref Range   Triglycerides 91 <150 mg/dL  CBC     Status: Abnormal   Collection Time: 01/26/21  4:41 AM  Result Value Ref Range   WBC 18.5 (H) 4.0 - 10.5 K/uL   RBC 3.97 (L) 4.22 - 5.81 MIL/uL   Hemoglobin 11.8 (L) 13.0 - 17.0 g/dL   HCT 16.1 (L) 09.6 - 04.5 %   MCV 88.9 80.0 - 100.0 fL   MCH 29.7 26.0 - 34.0 pg   MCHC 33.4 30.0 - 36.0 g/dL   RDW 40.9 81.1 - 91.4 %   Platelets 224 150 - 400 K/uL   nRBC 0.0 0.0 - 0.2 %  Basic metabolic panel     Status: Abnormal   Collection Time: 01/26/21  4:41 AM  Result Value Ref Range   Sodium 135 135 - 145 mmol/L   Potassium 3.7 3.5 - 5.1 mmol/L   Chloride 103 98 - 111 mmol/L   CO2 23 22 - 32 mmol/L   Glucose, Bld 139 (H) 70 - 99 mg/dL   BUN 11 8 - 23 mg/dL   Creatinine, Ser 7.82 0.61 - 1.24 mg/dL   Calcium 8.1 (L) 8.9 - 10.3 mg/dL    GFR, Estimated >95 >62 mL/min   Anion gap 9 5 - 15  Sodium     Status: None   Collection Time: 01/26/21  7:26 AM  Result Value Ref Range   Sodium 137 135 - 145 mmol/L  Glucose, capillary     Status: Abnormal   Collection Time: 01/26/21  7:32 AM  Result Value Ref Range   Glucose-Capillary 117 (H) 70 - 99 mg/dL  Glucose, capillary     Status: Abnormal   Collection Time: 01/26/21 11:24 AM  Result Value Ref Range   Glucose-Capillary 119 (H) 70 - 99 mg/dL  Sodium     Status: None   Collection Time: 01/26/21 12:39 PM  Result Value Ref Range   Sodium 140 135 - 145  mmol/L      Imaging Results (Last 48 hours)  DG Abd 1 View  Result Date: 01/25/2021 CLINICAL DATA:  OG tube placement EXAM: ABDOMEN - 1 VIEW COMPARISON:  None. FINDINGS: Esophageal tube side-port at or slightly above GE junction, suggest further advancement by 5-10 cm for more optimal positioning. Nonobstructed gas pattern. IMPRESSION: Esophageal tube side-port at or slightly above the GE junction, suggest further advancement by 5-10 cm for more optimal positioning. These results will be called to the ordering clinician or representative by the Radiologist Assistant, and communication documented in the PACS or [employer]. Electronically Signed   By: Jasmine Pang M.D.   On: 01/25/2021 20:45   CT HEAD WO CONTRAST  Result Date: 01/26/2021 CLINICAL DATA:  Stroke follow-up EXAM: CT HEAD WITHOUT CONTRAST TECHNIQUE: Contiguous axial images were obtained from the base of the skull through the vertex without intravenous contrast. COMPARISON:  Head CT 01/24/2021 FINDINGS: Brain: Large area of hypoattenuation at the site of known right MCA territory infarct. There is a focus of parenchymal hyperdensity at the posterior aspect of the infarct site. There is a small amount of subarachnoid hyperdensity over the right frontal convexity. Vascular: No hyperdense vessel or unexpected calcification. Skull: Normal. Negative for  fracture or focal lesion. Sinuses/Orbits: No acute finding. Other: None. IMPRESSION: 1. Large area of hypoattenuation at the site of known right MCA territory infarct. Small focus of parenchymal hyperdensity at the posterior aspect of the infarct site Edge indicate a small amount of petechial hemorrhage or contrast extravasation from earlier procedure. 2. Small amount of subarachnoid hyperdensity over the right frontal convexity, either subarachnoid blood or contrast staining. Electronically Signed   By: Deatra Robinson M.D.   On: 01/26/2021 01:19   MR ANGIO HEAD WO CONTRAST  Result Date: 01/25/2021 CLINICAL DATA:  Left hemiplegia and left facial droop EXAM: MRI HEAD WITHOUT CONTRAST MRA HEAD WITHOUT CONTRAST TECHNIQUE: Multiplanar, multiecho pulse sequences of the brain and surrounding structures were obtained without intravenous contrast. Angiographic images of the head were obtained using MRA technique without contrast. COMPARISON:  None. FINDINGS: MRI HEAD FINDINGS Brain: Large acute infarct of the right MCA territory involving the frontal operculum, insula and basal ganglia. There are punctate foci of acute ischemia in the right temporal lobe and right thalamus. Vascular: Major flow voids are preserved. Skull and upper cervical spine: Normal calvarium and skull base. Visualized upper cervical spine and soft tissues are normal. Sinuses/Orbits:No paranasal sinus fluid levels or advanced mucosal thickening. No mastoid or middle ear effusion. Normal orbits. MRA HEAD FINDINGS POSTERIOR CIRCULATION: --Vertebral arteries: Normal --Inferior cerebellar arteries: Normal. --Basilar artery: Normal. --Superior cerebellar arteries: Normal. --Posterior cerebral arteries: Normal. ANTERIOR CIRCULATION: --Intracranial internal carotid arteries: Normal. --Anterior cerebral arteries (ACA): Normal. --Middle cerebral arteries (MCA): Normal. ANATOMIC VARIANTS: None IMPRESSION: 1. Large acute infarct of the right MCA territory  involving the frontal operculum, insula and basal ganglia. No hemorrhage or mass effect. 2. Punctate foci of acute ischemia in the right temporal lobe and right thalamus. 3. Normal intracranial MRA. Electronically Signed   By: Deatra Robinson M.D.   On: 01/25/2021 23:32   MR BRAIN WO CONTRAST  Result Date: 01/25/2021 CLINICAL DATA:  Left hemiplegia and left facial droop EXAM: MRI HEAD WITHOUT CONTRAST MRA HEAD WITHOUT CONTRAST TECHNIQUE: Multiplanar, multiecho pulse sequences of the brain and surrounding structures were obtained without intravenous contrast. Angiographic images of the head were obtained using MRA technique without contrast. COMPARISON:  None. FINDINGS: MRI HEAD FINDINGS Brain: Large acute  infarct of the right MCA territory involving the frontal operculum, insula and basal ganglia. There are punctate foci of acute ischemia in the right temporal lobe and right thalamus. Vascular: Major flow voids are preserved. Skull and upper cervical spine: Normal calvarium and skull base. Visualized upper cervical spine and soft tissues are normal. Sinuses/Orbits:No paranasal sinus fluid levels or advanced mucosal thickening. No mastoid or middle ear effusion. Normal orbits. MRA HEAD FINDINGS POSTERIOR CIRCULATION: --Vertebral arteries: Normal --Inferior cerebellar arteries: Normal. --Basilar artery: Normal. --Superior cerebellar arteries: Normal. --Posterior cerebral arteries: Normal. ANTERIOR CIRCULATION: --Intracranial internal carotid arteries: Normal. --Anterior cerebral arteries (ACA): Normal. --Middle cerebral arteries (MCA): Normal. ANATOMIC VARIANTS: None IMPRESSION: 1. 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 No hemorrhage or mass effect. 2. Punctate foci of acute ischemia in the right temporal lobe and right thalamus. 3. Normal intracranial MRA. Electronically Signed   By: Deatra Robinson M.D.   On: 01/25/2021 23:32   CT CEREBRAL  PERFUSION W CONTRAST  Result Date: 01/24/2021 CLINICAL DATA:  Left-sided deficits EXAM: CT ANGIOGRAPHY HEAD AND NECK CT PERFUSION BRAIN TECHNIQUE: Multidetector CT imaging of the head and neck was performed using the standard protocol during bolus administration of intravenous contrast. Multiplanar CT image reconstructions and MIPs were obtained to evaluate the vascular anatomy. Carotid stenosis measurements (when applicable) are obtained utilizing NASCET criteria, using the distal internal carotid diameter as the denominator. Multiphase CT imaging of the brain was performed following IV bolus contrast injection. Subsequent parametric perfusion maps were calculated using RAPID software. CONTRAST:  OMNIPAQUE IOHEXOL 350 MG/ML SOLN COMPARISON:  None. FINDINGS: CTA NECK FINDINGS SKELETON: There is no bony spinal canal stenosis. No lytic or blastic lesion. OTHER NECK: Normal pharynx, larynx and major salivary glands. No cervical lymphadenopathy. Unremarkable thyroid gland. UPPER CHEST: No pneumothorax or pleural effusion. No nodules or masses. AORTIC ARCH: There is calcific atherosclerosis of the aortic arch. There is no aneurysm, dissection or hemodynamically significant stenosis of the visualized portion of the aorta. Conventional 3 vessel aortic branching pattern. The visualized proximal subclavian arteries are widely patent. 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 The right common and external carotid arteries are normal. LEFT CAROTID SYSTEM: No dissection, occlusion or aneurysm. Mild atherosclerotic calcification at the carotid bifurcation without hemodynamically significant stenosis. VERTEBRAL ARTERIES: Left dominant configuration. Both origins are clearly patent. There is no dissection, occlusion or flow-limiting stenosis to the skull base (V1-V3 segments). CTA HEAD FINDINGS POSTERIOR CIRCULATION: --Vertebral arteries: Normal V4 segments. --Inferior cerebellar arteries: Normal.  --Basilar artery: Normal. --Superior cerebellar arteries: Normal. --Posterior cerebral arteries (PCA): Normal. ANTERIOR CIRCULATION: --Intracranial internal carotid arteries: Normal. --Anterior cerebral arteries (ACA): Normal. Both A1 segments are present. Patent anterior communicating artery (a-comm). --Middle cerebral arteries (MCA): Complete occlusion of the right MCA at its origin. There is no collateral flow demonstrated. VENOUS SINUSES: As permitted by contrast timing, patent. ANATOMIC VARIANTS: None Review of the MIP images confirms the above findings. CT Brain Perfusion Findings: ASPECTS: 7 CBF (<30%) Volume: 68mL Perfusion (Tmax>6.0s) volume: 2 L59mL Mismatch Volume: Infarction Location:Right MCA territory IMPRESSION: 1. 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 2. 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 3. 68 mL right MCA territory core infarct with 133 mL area of ischemic penumbra. Critical Value/emergent results were called by telephone at the time of interpretation on 01/24/2021 at  11:24 pm to provider ERIC LINDZEN , who verbally acknowledged these results. Aortic Atherosclerosis (ICD10-I70.0). Electronically Signed   By: Deatra Robinson M.D.   On: 01/24/2021 23:34   DG CHEST PORT 1 VIEW  Result Date: 01/25/2021 CLINICAL DATA:  Intubation. EXAM: PORTABLE CHEST 1 VIEW COMPARISON:  No prior. FINDINGS: Endotracheal tube and NG tube in good anatomic position. Cardiomegaly. No pulmonary venous congestion. Low lung volumes with bibasilar atelectasis. No pleural effusion or pneumothorax. IMPRESSION: 1.  Endotracheal tube and NG tube in good anatomic position. 2.  Cardiomegaly.  No pulmonary venous congestion. 3.  Low lung volumes with bibasilar atelectasis. Electronically Signed   By: Maisie Fus  Register   On: 01/25/2021 05:11   ECHOCARDIOGRAM COMPLETE  Result Date: 01/25/2021     ECHOCARDIOGRAM REPORT   Patient Name:   OSAMAH SCHMADER Bordley Date of Exam: 01/25/2021 Medical Rec #:  [id number]    Height:       69.0 in Accession #:    [id number]   Weight:       182.1 lb Date of Birth:  [birth year]   BSA:          1.985 m Patient Age:    64 years     BP:           117/59 mmHg Patient Gender: M            HR:           75 bpm. Exam Location:  Inpatient Procedure: 2D Echo, Color Doppler and Cardiac Doppler Indications:    Stroke I63.9  History:        Patient has no prior history of Echocardiogram examinations.  Sonographer:    Elmarie Shiley Dance Referring Phys: [id number] ERIC LINDZEN IMPRESSIONS  1. Left ventricular ejection fraction, by estimation, is 60 to 65%. The left ventricle has normal function. The left ventricle has no regional wall motion abnormalities. There is mild left ventricular hypertrophy. Left ventricular diastolic parameters were normal.  2. Right ventricular systolic function is normal. The right ventricular size is normal.  3. Left atrial size was moderately dilated.  4. The mitral valve is normal in structure. No evidence of mitral valve regurgitation. No evidence of mitral stenosis.  5. The aortic valve is tricuspid. Aortic valve regurgitation is not visualized. No aortic stenosis is present.  6. The inferior vena cava is normal in size with greater than 50% respiratory variability, suggesting right atrial pressure of 3 mmHg. FINDINGS  Left Ventricle: Left ventricular ejection fraction, by estimation, is 60 to 65%. The left ventricle has normal function. The left ventricle has no regional wall motion abnormalities. The left ventricular internal cavity size was normal in size. There is  mild left ventricular hypertrophy. Left ventricular diastolic parameters were normal. Right Ventricle: The right ventricular size is normal. No increase in right ventricular wall thickness. Right ventricular systolic function is normal. Left Atrium: Left atrial size was moderately dilated. Right Atrium: Right  atrial size was normal in size. Pericardium: There is no evidence of pericardial effusion. Mitral Valve: The mitral valve is normal in structure. No evidence of mitral valve regurgitation. No evidence of mitral valve stenosis. Tricuspid Valve: The tricuspid valve is normal in structure. Tricuspid valve regurgitation is not demonstrated. No evidence of tricuspid stenosis. Aortic Valve: The aortic valve is tricuspid. Aortic valve regurgitation is not visualized. No aortic stenosis is present. Pulmonic Valve: The pulmonic valve was normal in structure. Pulmonic valve regurgitation is not visualized. No evidence of pulmonic stenosis. Aorta:  The aortic root is normal in size and structure. Venous: The inferior vena cava is normal in size with greater than 50% respiratory variability, suggesting right atrial pressure of 3 mmHg. IAS/Shunts: No atrial level shunt detected by color flow Doppler.  LEFT VENTRICLE PLAX 2D LVIDd:         5.20 cm Diastology LVIDs:         3.10 cm LV e' medial:    5.22 cm/s LV PW:         1.40 cm LV E/e' medial:  13.2 LV IVS:        1.40 cm LV e' lateral:   8.16 cm/s                        LV E/e' lateral: 8.5  RIGHT VENTRICLE             IVC RV Basal diam:  1.90 cm     IVC diam: 2.20 cm RV S prime:     13.10 cm/s TAPSE (M-mode): 1.9 cm LEFT ATRIUM             Index       RIGHT ATRIUM           Index LA diam:        4.60 cm 2.32 cm/m  RA Area:     10.90 cm LA Vol (A2C):   44.1 ml 22.21 ml/m RA Volume:   18.40 ml  9.27 ml/m LA Vol (A4C):   45.2 ml 22.77 ml/m LA Biplane Vol: 46.6 ml 23.47 ml/m  AORTIC VALVE LVOT Vmax:   129.00 cm/s LVOT Vmean:  89.700 cm/s LVOT VTI:    0.311 m  AORTA Ao Asc diam: 3.40 cm MITRAL VALVE MV Area (PHT): 3.08 cm     SHUNTS MV Decel Time: 246 msec     Systemic VTI: 0.31 m MV E velocity: 69.00 cm/s MV A velocity: 105.00 cm/s MV E/A ratio:  0.66 Leonard Haws MD Electronically signed by Leonard Haws MD Signature Date/Time: 01/25/2021/1:07:57 PM    Final    CT HEAD  CODE STROKE WO CONTRAST  Result Date: 01/24/2021 CLINICAL DATA:  Code stroke.  Slurred speech and left-sided deficits EXAM: CT HEAD WITHOUT CONTRAST TECHNIQUE: Contiguous axial images were obtained from the base of the skull through the vertex without intravenous contrast. COMPARISON:  None. FINDINGS: Brain: There is no mass, hemorrhage or extra-axial collection. The size and configuration of the ventricles and extra-axial CSF spaces are normal. There is loss of gray-white differentiation within the right insula and mid MCA territory. Vascular: Hyperdense appearance of the right MCA. Skull: The visualized skull base, calvarium and extracranial soft tissues are normal. Sinuses/Orbits: No fluid levels or advanced mucosal thickening of the visualized paranasal sinuses. No mastoid or middle ear effusion. The orbits are normal. ASPECTS [facility] Stroke Program Early CT Score) - Ganglionic level infarction (caudate, lentiform nuclei, internal capsule, insula, M1-M3 cortex): 5 - Supraganglionic infarction (M4-M6 cortex): 2 Total score (0-10 with 10 being normal): 7 IMPRESSION: 1. 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 2. Hyperdense appearance of the right MCA. 3. ASPECTS is 7. These results were called by telephone at the time of interpretation on 01/24/2021 at 10:58 pm to provider Caryl Pina, who verbally acknowledged these results. Electronically Signed   By: Deatra Robinson M.D.   On: 01/24/2021 22:58   VAS US CAROTID  Result Date: 01/25/2021 Carotid Arterial Duplex Study Indications:  Right stent and Check post-op patency of RT ICA stent. Other Factors:     No medical history on file. Limitations        Today's exam was limited due to the post surgical status of                    the patient and tissue properties. Comparison Study:  No previous exams Performing Technologist: Ernestene Mention  Examination Guidelines: A complete evaluation includes B-mode imaging, spectral Doppler,  color Doppler, and power Doppler as needed of all accessible portions of each vessel. Bilateral testing is considered an integral part of a complete examination. Limited examinations for reoccurring indications [place] be performed as noted.  Right Carotid Findings: +----------+--------+--------+--------+------------------+------------------+           PSV cm/sEDV cm/sStenosisPlaque DescriptionComments           +----------+--------+--------+--------+------------------+------------------+ CCA Prox  91      14                                intimal thickening +----------+--------+--------+--------+------------------+------------------+ CCA Distal83      11              hypoechoic        intimal thickening +----------+--------+--------+--------+------------------+------------------+ ECA       108     0                                                    +----------+--------+--------+--------+------------------+------------------+ +----------+--------+-------+----------------+-------------------+           PSV cm/sEDV cmsDescribe        Arm Pressure (mmHG) +----------+--------+-------+----------------+-------------------+ [id number]            Multiphasic, WNL                    +----------+--------+-------+----------------+-------------------+ +---------+--------+--+--------+-+---------+ VertebralPSV cm/s48EDV cm/s7Antegrade +---------+--------+--+--------+-+---------+  Right Stent(s): +---------------+---+--+-------------++-----------------------------+ Prox to Stent  10918<50% stenosis                              +---------------+---+--+-------------++-----------------------------+ Proximal Stent 14220<50% stenosis                              +---------------+---+--+-------------++-----------------------------+ Mid Stent      12426<50% stenosis                              +---------------+---+--+-------------++-----------------------------+ Distal  Stent   17531<50% stenosis                              +---------------+---+--+-------------++-----------------------------+ Distal to Stent13436<50% stenosisDifficult to see end of stent +---------------+---+--+-------------++-----------------------------+    Summary: Right Carotid: The extracranial vessels were near-normal with only minimal wall                thickening or plaque. The ICA stent appears patent in it's                entirety. Vertebrals:  Right vertebral artery demonstrates antegrade flow. Subclavians: Normal flow hemodynamics were seen in the right subclavian artery. *See table(s) above for measurements and observations.  Electronically  signed by Coral Else MD on 01/25/2021 at 9:26:22 PM.    Final    CT ANGIO HEAD CODE STROKE  Result Date: 01/24/2021 CLINICAL DATA:  Left-sided deficits EXAM: CT ANGIOGRAPHY HEAD AND NECK CT PERFUSION BRAIN TECHNIQUE: Multidetector CT imaging of the head and neck was performed using the standard protocol during bolus administration of intravenous contrast. Multiplanar CT image reconstructions and MIPs were obtained to evaluate the vascular anatomy. Carotid stenosis measurements (when applicable) are obtained utilizing NASCET criteria, using the distal internal carotid diameter as the denominator. Multiphase CT imaging of the brain was performed following IV bolus contrast injection. Subsequent parametric perfusion maps were calculated using RAPID software. CONTRAST:  OMNIPAQUE IOHEXOL 350 MG/ML SOLN COMPARISON:  None. FINDINGS: CTA NECK FINDINGS SKELETON: There is no bony spinal canal stenosis. No lytic or blastic lesion. OTHER NECK: Normal pharynx, larynx and major salivary glands. No cervical lymphadenopathy. Unremarkable thyroid gland. UPPER CHEST: No pneumothorax or pleural effusion. No nodules or masses. AORTIC ARCH: There is calcific atherosclerosis of the aortic arch. There is no aneurysm, dissection or hemodynamically significant  stenosis of the visualized portion of the aorta. Conventional 3 vessel aortic branching pattern. The visualized proximal subclavian arteries are widely patent. 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 The right common and external carotid arteries are normal. LEFT CAROTID SYSTEM: No dissection, occlusion or aneurysm. Mild atherosclerotic calcification at the carotid bifurcation without hemodynamically significant stenosis. VERTEBRAL ARTERIES: Left dominant configuration. Both origins are clearly patent. There is no dissection, occlusion or flow-limiting stenosis to the skull base (V1-V3 segments). CTA HEAD FINDINGS POSTERIOR CIRCULATION: --Vertebral arteries: Normal V4 segments. --Inferior cerebellar arteries: Normal. --Basilar artery: Normal. --Superior cerebellar arteries: Normal. --Posterior cerebral arteries (PCA): Normal. ANTERIOR CIRCULATION: --Intracranial internal carotid arteries: Normal. --Anterior cerebral arteries (ACA): Normal. Both A1 segments are present. Patent anterior communicating artery (a-comm). --Middle cerebral arteries (MCA): Complete occlusion of the right MCA at its origin. There is no collateral flow demonstrated. VENOUS SINUSES: As permitted by contrast timing, patent. ANATOMIC VARIANTS: None Review of the MIP images confirms the above findings. CT Brain Perfusion Findings: ASPECTS: 7 CBF (<30%) Volume: 68mL Perfusion (Tmax>6.0s) volume: 2 L62mL Mismatch Volume: Infarction Location:Right MCA territory IMPRESSION: 1. 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 2. 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 3. 68 mL right MCA territory core infarct with 133 mL area of ischemic penumbra. Critical Value/emergent results were called by telephone at the time of interpretation on 01/24/2021 at 11:24 pm to provider ERIC [facility] , who verbally acknowledged these results.  Aortic Atherosclerosis (ICD10-I70.0). Electronically Signed   By: Deatra Robinson M.D.   On: 01/24/2021 23:34   CT ANGIO NECK CODE STROKE  Result Date: 01/24/2021 CLINICAL DATA:  Left-sided deficits EXAM: CT ANGIOGRAPHY HEAD AND NECK CT PERFUSION BRAIN TECHNIQUE: Multidetector CT imaging of the head and neck was performed using the standard protocol during bolus administration of intravenous contrast. Multiplanar CT image reconstructions and MIPs were obtained to evaluate the vascular anatomy. Carotid stenosis measurements (when applicable) are obtained utilizing NASCET criteria, using the distal internal carotid diameter as the denominator. Multiphase CT imaging of the brain was performed following IV bolus contrast injection. Subsequent parametric perfusion maps were calculated using RAPID software. CONTRAST:  OMNIPAQUE IOHEXOL 350 MG/ML SOLN COMPARISON:  None. FINDINGS: CTA NECK FINDINGS SKELETON: There is no bony spinal canal stenosis. No lytic  or blastic lesion. OTHER NECK: Normal pharynx, larynx and major salivary glands. No cervical lymphadenopathy. Unremarkable thyroid gland. UPPER CHEST: No pneumothorax or pleural effusion. No nodules or masses. AORTIC ARCH: There is calcific atherosclerosis of the aortic arch. There is no aneurysm, dissection or hemodynamically significant stenosis of the visualized portion of the aorta. Conventional 3 vessel aortic branching pattern. The visualized proximal subclavian arteries are widely patent. 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 The right common and external carotid arteries are normal. LEFT CAROTID SYSTEM: No dissection, occlusion or aneurysm. Mild atherosclerotic calcification at the carotid bifurcation without hemodynamically significant stenosis. VERTEBRAL ARTERIES: Left dominant configuration. Both origins are clearly patent. There is no dissection, occlusion or flow-limiting stenosis to the skull base (V1-V3 segments). CTA HEAD  FINDINGS POSTERIOR CIRCULATION: --Vertebral arteries: Normal V4 segments. --Inferior cerebellar arteries: Normal. --Basilar artery: Normal. --Superior cerebellar arteries: Normal. --Posterior cerebral arteries (PCA): Normal. ANTERIOR CIRCULATION: --Intracranial internal carotid arteries: Normal. --Anterior cerebral arteries (ACA): Normal. Both A1 segments are present. Patent anterior communicating artery (a-comm). --Middle cerebral arteries (MCA): Complete occlusion of the right MCA at its origin. There is no collateral flow demonstrated. VENOUS SINUSES: As permitted by contrast timing, patent. ANATOMIC VARIANTS: None Review of the MIP images confirms the above findings. CT Brain Perfusion Findings: ASPECTS: 7 CBF (<30%) Volume: 68mL Perfusion (Tmax>6.0s) volume: 2 L411mL Mismatch Volume: 133mL Infarction Location:Right MCA territory IMPRESSION: 1. 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 2. 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 3. 68 mL right MCA territory core infarct with 133 mL area of ischemic penumbra. Critical Value/emergent results were called by telephone at the time of interpretation on 01/24/2021 at 11:24 pm [facility] , who verbally acknowledged these results. Aortic Atherosclerosis (ICD10-I70.0). Electronically Signed   By: Deatra RobinsonKevin  Herman M.D.   On: 01/24/2021 23:34     Assessment/Plan: Diagnosis: acute infarct right MCA infarct Stroke: Continue secondary stroke prophylaxis and Risk Factor Modification listed below:   Antiplatelet therapy:   Blood Pressure Management:  Continue current medication with prn's with permisive HTN per primary team Statin Agent:   Prediabetes management:   Presumed left sided hemiparesis: fit for orthosis to prevent contractures (resting hand splint for day, wrist cock up splint at night, PRAFO, etc) PT/OT for mobility, ADL training  Motor  recovery: Fluoxetine Labs independently reviewed.  Records reviewed and summated above.  1. Does the need for close, 24 hr/day medical supervision in concert with the patient's rehab needs make it unreasonable for this patient to be served in a less intensive setting? Yes 3. Co-Morbidities requiring supervision/potential complications: HTN (monitor and provide prns in accordance with increased physical exertion and pain), tachypnea (monitor RR and O2 Sats with increased physical exertion), leukocytosis (repeat labs, cont to monitor for signs and symptoms of infection, further workup if indicated), ABLA (repeat labs, consider transfusion if necessary to ensure appropriate perfusion for increased activity tolerance), post-stroke dysphagia (advance diet as tolerated) 2. Due to bladder management, bowel management, safety, skin/wound care, disease management, medication administration, pain management and patient education, does the patient require 24 hr/day rehab nursing? Yes 3. Does the patient require coordinated care of a physician, rehab nurse, therapy disciplines of PT/OT/SLP to address physical and functional deficits in the context of the above medical diagnosis(es)? Yes Addressing deficits in the following areas: balance, endurance, locomotion, strength, transferring, bowel/bladder control, bathing, dressing, feeding,  grooming, toileting, cognition, speech, language, swallowing and psychosocial support 4. Can the patient actively participate in an intensive therapy program of at least 3 hrs of therapy per day at least 5 days per week? Potentially 5. The potential for patient to make measurable gains while on inpatient rehab is excellent 6. Anticipated functional outcomes upon discharge from inpatient rehab are supervision and min assist  with PT, supervision and min assist with OT, supervision and min assist with SLP. 7. Estimated rehab length of stay to reach the above functional goals is: 18-21  days. 8. Anticipated discharge destination: TBD 9. Overall Rehab/Functional Prognosis: good  RECOMMENDATIONS: This patient's condition is appropriate for continued rehabilitative care in the following setting: CIR when medically stable and able to tolerate 3 hours of therapy/day if adequate caregiver support available upon discharge.  Patient has agreed to participate in recommended program. Potentially Note that insurance prior authorization [place] be required for reimbursement for recommended care.  Comment: Rehab Admissions Coordinator to follow up.  I have personally performed a face to face diagnostic evaluation, including, but not limited to relevant history and physical exam findings, of this patient and developed relevant assessment and plan.  Additionally, I have reviewed and concur with the physician assistant's documentation above.   Maryla Morrow, MD, ABPMR Leonard Amor, PA-C 01/26/2021          Revision History                        Routing History                   Note Details  Author Leonard Fennel, MD File Time 01/26/2021 4:00 PM  Author Type Physician Status Signed  Last Editor Leonard Fennel, MD Service Physical Medicine and Rehabilitation  Hospital Acct # [id number] Admit Date 02/15/2021

## 2021-02-16 DIAGNOSIS — I63511 Cerebral infarction due to unspecified occlusion or stenosis of right middle cerebral artery: Secondary | ICD-10-CM | POA: Diagnosis not present

## 2021-02-16 LAB — CBC WITH DIFFERENTIAL/PLATELET
Abs Immature Granulocytes: 0.03 10*3/uL (ref 0.00–0.07)
Basophils Absolute: 0 10*3/uL (ref 0.0–0.1)
Basophils Relative: 1 %
Eosinophils Absolute: 0.2 10*3/uL (ref 0.0–0.5)
Eosinophils Relative: 6 %
HCT: 35.7 % — ABNORMAL LOW (ref 39.0–52.0)
Hemoglobin: 11.8 g/dL — ABNORMAL LOW (ref 13.0–17.0)
Immature Granulocytes: 1 %
Lymphocytes Relative: 38 %
Lymphs Abs: 1.1 10*3/uL (ref 0.7–4.0)
MCH: 30.3 pg (ref 26.0–34.0)
MCHC: 33.1 g/dL (ref 30.0–36.0)
MCV: 91.8 fL (ref 80.0–100.0)
Monocytes Absolute: 0.8 10*3/uL (ref 0.1–1.0)
Monocytes Relative: 27 %
Neutro Abs: 0.8 10*3/uL — ABNORMAL LOW (ref 1.7–7.7)
Neutrophils Relative %: 27 %
Platelets: 190 10*3/uL (ref 150–400)
RBC: 3.89 MIL/uL — ABNORMAL LOW (ref 4.22–5.81)
RDW: 15.8 % — ABNORMAL HIGH (ref 11.5–15.5)
WBC: 2.8 10*3/uL — ABNORMAL LOW (ref 4.0–10.5)
nRBC: 0 % (ref 0.0–0.2)

## 2021-02-16 LAB — COMPREHENSIVE METABOLIC PANEL
ALT: 129 U/L — ABNORMAL HIGH (ref 0–44)
AST: 75 U/L — ABNORMAL HIGH (ref 15–41)
Albumin: 2.9 g/dL — ABNORMAL LOW (ref 3.5–5.0)
Alkaline Phosphatase: 79 U/L (ref 38–126)
Anion gap: 8 (ref 5–15)
BUN: 15 mg/dL (ref 8–23)
CO2: 24 mmol/L (ref 22–32)
Calcium: 8.5 mg/dL — ABNORMAL LOW (ref 8.9–10.3)
Chloride: 104 mmol/L (ref 98–111)
Creatinine, Ser: 0.83 mg/dL (ref 0.61–1.24)
GFR, Estimated: >60 mL/min (ref >60)
Glucose, Bld: 105 mg/dL — ABNORMAL HIGH (ref 70–99)
Potassium: 4.1 mmol/L (ref 3.5–5.1)
Sodium: 136 mmol/L (ref 135–145)
Total Bilirubin: 1.4 mg/dL — ABNORMAL HIGH (ref 0.3–1.2)
Total Protein: 5.3 g/dL — ABNORMAL LOW (ref 6.5–8.1)

## 2021-02-16 MED ORDER — LIVING WELL WITH DIABETES BOOK
Freq: Once | Status: AC
  Filled 2021-02-16: qty 1

## 2021-02-16 NOTE — Progress Notes (Signed)
Inpatient Rehabilitation  Patient information reviewed and entered into eRehab system by Anahy Esh M. Larry Alcock, M.A., CCC/SLP, PPS Coordinator.  Information including medical coding, functional ability and quality indicators will be reviewed and updated through discharge.    

## 2021-02-16 NOTE — Evaluation (Signed)
Speech Language Pathology Assessment and Plan  Patient Details  Name: Leonard [name] MRN: [id number] Date of Birth: 1956/09/21  SLP Diagnosis: Dysarthria;Dysphagia;Cognitive Impairments  Rehab Potential: Good ELOS: 18-21 days   Today's Date: 02/16/2021 SLP Individual Time: 2563-8937 SLP Individual Time Calculation (min): 58 min  Hospital Problem: Active Problems:   Right middle cerebral artery stroke [facility])  Past Medical History: History reviewed. No pertinent past medical history. Past Surgical History:  Past Surgical History:  Procedure Laterality Date  . IR ANGIO INTRA EXTRACRAN SEL COM CAROTID INNOMINATE UNI L MOD SED  01/25/2021  . IR CT HEAD LTD  01/25/2021  . IR CT HEAD LTD  01/25/2021  . IR PERCUTANEOUS ART THROMBECTOMY/INFUSION INTRACRANIAL INC DIAG ANGIO  01/25/2021  . RADIOLOGY WITH ANESTHESIA N/A 01/24/2021   Procedure: IR WITH ANESTHESIA;  Surgeon: Luanne Bras, MD;  Location: [place];  Service: Radiology;  Laterality: N/A;    Assessment / Plan / Recommendation Clinical Impression Patient is a 65 y.o. year old male with unremarkable past medical history. He lives with spouse independent prior to admission. He has a son that lives nearby. 1 level home 4 steps to entry. Presented 01/24/2021 with acute onset of left-sided weakness dysarthria and facial droop. Noted blood pressure severely elevated to 32/124. 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 Patient underwent revascularization per interventional radiology. Follow-up MRI 01/25/2021 showed a large acute infarct right MCA territory involving the frontal operculum insula and basal ganglia. No hemorrhage or mass-effect. Punctate foci of acute ischemia  in the right temporal lobe and right thalamus. MRA was unremarkable. Echocardiogram with ejection fraction of 60 to 65% no wall motion abnormalities. Currently maintained on aspirin for CVA prophylaxis as well as Brilinta. Hospital course complicated by dysphagia patient n.p.o. alternative means of nutritional support and now has been advanced to dysphagia #2 nectar thick liquid diet.. Cleviprex added for blood pressure control. Hospital course complicated by acute hypoxic respiratory failure due to COVID-19 pneumonia possible aspiration. Hypoxia resolved treated with steroids/remdesivir/Actemraand broad-spectrum antimicrobial therapy. Note he was unvaccinated. Context infectious disease no need for isolation from 02/14/2021 onwards. He did experience some urinary retention with a Foley tube inserted hand placed on Flomax.Therapy evaluations completed due to patient's left-sided weakness dysphagia and dysarthria was admitted for a comprehensive rehab program. Patient transferred to CIR on 02/15/2021 .   Pt presents with moderate cognitive impairment as evidenced by SLUMS score of 13/30. Pt incorrect responses often close to being right, unable to state it is Friday but knows "It's the end of the week", or repeating 3 words from morning OT BIMS assessment vs words from [place]. Pt with impaired sustained/focused attention impacting divergent naming task. Pt reports having visual impairments acutely after CVA but they have resolved, clock spacing accurate with mild number inaccuracies and incorrect hand placement. Pt writing to dictation, often repeating letters "Siittt" for "Sit", "Clicckk" for "Click", "Foodd" for "Food", etc. Pt not aware of these errors.   Pt presents with mild-moderate oropharyngeal dysphagia as seen on most recent MBSS (5/11) and during clinical swallow evaluation. Pt tolerating ice chips and NTL with delayed oral phase and apparent mildly delayed swallow initiation. Puree solids  with mild delayed A-P transit and min oral stasis after initial swallow. Dys  2 solids with decreased bolus prep and transit, mild diffuse oral stasis after initial swallow. Cued liquid wash and lingual sweep to L buccal area effective in clearing stasis. Pt will benefit from continuing Dys 2/NTL diet with advance texture trials as appropriate. Pt and family education and training on swallow precautions and strategies ongoing.   Pt expressive language generally WFL when given portions of MAST. Naming 100%, Automatic speech 100%, Repetition 100%. Pt with mild impairment in comprehension and following directions, unable to complete reading instructions portion independently. Pt is HOH, reports he probably needs hearing aids, so at times difficult to determine impaired comprehension vs HOH.     Skilled Therapeutic Interventions          Pt participating in Clinical Swallow Evaluation, SLUMS assessment, and further assessment of speech, language and cognition. Please see above.  SLP Assessment  Patient will need skilled Speech Lanaguage Pathology Services during CIR admission    Recommendations  SLP Diet Recommendations: Nectar;Dysphagia 2 (Fine chop) Liquid Administration via: Spoon;Cup Medication Administration: Crushed with puree Supervision: Full supervision/cueing for compensatory strategies Compensations: Slow rate;Small sips/bites;Multiple dry swallows after each bite/sip;Follow solids with liquid Postural Changes and/or Swallow Maneuvers: Seated upright 90 degrees Oral Care Recommendations: Oral care QID Recommendations for Other Services: Neuropsych consult Patient destination: Home Follow up Recommendations: Outpatient SLP;Home Health SLP Equipment Recommended: To be determined    SLP Frequency 3 to 5 out of 7 days   SLP Duration  SLP Intensity  SLP Treatment/Interventions 18-21 days  Minumum of 1-2 x/day, 30 to 90 minutes  Cognitive remediation/compensation;Speech/Language  facilitation;Therapeutic Activities;Therapeutic Exercise;Functional tasks;Cueing hierarchy;Dysphagia/aspiration precaution training;Medication managment;Patient/family education    Pain Pain Assessment Pain Scale: 0-10 Pain Score: 0-No pain  Prior Functioning Cognitive/Linguistic Baseline: Within functional limits Type of Home: House  Lives With: Spouse Available Help at Discharge: Family;Available PRN/intermittently Education: HS Vocation: Retired  Programmer, systems Overall Cognitive Status: Impaired/Different from baseline Arousal/Alertness: Awake/alert Orientation Level: Oriented to person;Oriented to place;Oriented to situation Attention: Focused;Sustained Focused Attention: Impaired Focused Attention Impairment: Verbal basic;Verbal complex Sustained Attention: Impaired Sustained Attention Impairment: Verbal basic;Verbal complex Memory: Impaired Memory Impairment: Retrieval deficit;Storage deficit;Decreased recall of new information;Decreased short term memory Awareness: Impaired Awareness Impairment: Intellectual impairment Problem Solving: Impaired Problem Solving Impairment: Verbal basic;Verbal complex Executive Function: Organizing;Self Monitoring;Self Correcting Organizing: Impaired Organizing Impairment: Verbal basic Self Monitoring: Impaired Self Monitoring Impairment: Verbal basic Self Correcting: Impaired Self Correcting Impairment: Verbal basic Safety/Judgment: Impaired  Comprehension Auditory Comprehension Overall Auditory Comprehension: Impaired Yes/No Questions: Within Functional Limits Basic Biographical Questions: 76-100% accurate Complex Questions: 50-74% accurate Commands: Impaired One Step Basic Commands: 75-100% accurate Two Step Basic Commands: 50-74% accurate Conversation: Simple Interfering Components: Attention;Hearing EffectiveTechniques: Extra processing time;Increased volume;Repetition;Slowed  speech Expression Expression Primary Mode of Expression: Verbal Verbal Expression Overall Verbal Expression: Appears within functional limits for tasks assessed Level of Generative/Spontaneous Verbalization: Phrase;Sentence Repetition: No impairment Naming: No impairment Confrontation: Within functional limits Interfering Components: Attention Written Expression Dominant Hand: Right Written Expression: Exceptions to [facility] Copy Ability: Word;Letter Dictation Ability: Word;Letter Oral Motor Oral Motor/Sensory Function Overall Oral Motor/Sensory Function: Moderate impairment Facial ROM: Reduced left;Suspected CN VII (facial) dysfunction Facial Symmetry: Abnormal symmetry left;Suspected CN VII (facial) dysfunction Facial Strength: Reduced left;Suspected CN VII (facial) dysfunction Facial Sensation: Reduced left Lingual ROM: Reduced left Lingual Strength: Reduced;Suspected CN XII (hypoglossal) dysfunction Lingual Sensation: Reduced;Suspected CN VII (facial) dysfunction-anterior 2/3 tongue Motor Speech Overall Motor Speech: Impaired Respiration: Within functional limits Phonation: Low vocal intensity Resonance: Within functional limits Articulation: Impaired Level  of Impairment: Phrase Intelligibility: Intelligibility reduced Word: 75-100% accurate Phrase: 75-100% accurate Sentence: 50-74% accurate Conversation: 50-74% accurate Motor Planning: Witnin functional limits Motor Speech Errors: Aware;Consistent  Care Tool Care Tool Cognition Expression of Ideas and Wants Expression of Ideas and Wants: Some difficulty - exhibits some difficulty with expressing needs and ideas (e.g, some words or finishing thoughts) or speech is not clear   Understanding Verbal and Non-Verbal Content Understanding Verbal and Non-Verbal Content: Sometimes understands - understands only basic conversations or simple, direct phrases. Frequently requires cues to understand   Memory/Recall Ability *first 3  days only Memory/Recall Ability *first 3 days only: That he or she is in a hospital/hospital unit;Staff names and faces     Intelligibility: Intelligibility reduced Word: 75-100% accurate Phrase: 75-100% accurate Sentence: 50-74% accurate Conversation: 50-74% accurate  Bedside Swallowing Assessment General Date of Onset: 01/25/21 Previous Swallow Assessment: See HPI Diet Prior to this Study: Dysphagia 2 (chopped);Nectar-thick liquids Temperature Spikes Noted: No Respiratory Status: Room air History of Recent Intubation: No Behavior/Cognition: Alert;Cooperative;Pleasant mood Oral Cavity - Dentition: Adequate natural dentition Self-Feeding Abilities: Needs assist;Needs set up Vision: Functional for self-feeding Patient Positioning: Upright in chair/Tumbleform Baseline Vocal Quality: Low vocal intensity Volitional Cough: Strong Volitional Swallow: Able to elicit  Oral Care Assessment Does patient have any of the following "high(er) risk" factors?: None of the above Ice Chips Ice chips: Within functional limits Thin Liquid Thin Liquid: Not tested Nectar Thick Nectar Thick Liquid: Within functional limits Honey Thick Honey Thick Liquid: Not tested Puree Puree: Impaired Presentation: Spoon Oral Phase Impairments: Poor awareness of bolus Oral Phase Functional Implications: Oral residue;Prolonged oral transit Pharyngeal Phase Impairments: Multiple swallows Solid Solid: Impaired Presentation: Self Fed Oral Phase Impairments: Poor awareness of bolus Oral Phase Functional Implications: Prolonged oral transit;Oral residue Pharyngeal Phase Impairments: Multiple swallows BSE Assessment Risk for Aspiration Impact on safety and function: Mild aspiration risk Other Related Risk Factors: Lethargy  Short Term Goals: Week 1: SLP Short Term Goal 1 (Week 1): Pt will tolerate Dys 2/NTL diet with no overt s/s aspiration with min A cues for use of swallow strategies as trained SLP Short  Term Goal 2 (Week 1): Pt will tolerate trials of Dys 3, ice chips and thin liquids with minimal s/s aspiration and Min A cues for use of swallow strategies SLP Short Term Goal 3 (Week 1): Pt will recall basic novel/functional information with mod A verbal cues for compensatory strategies SLP Short Term Goal 4 (Week 1): Pt will follow 2-3 step simple and abstract directions with mod A SLP Short Term Goal 5 (Week 1): Pt will utilize compensatory strategies to increase intelligibility to 90% at sentence level with min A  Refer to Care Plan for Long Term Goals  Recommendations for other services: Neuropsych  Discharge Criteria: Patient will be discharged from SLP if patient refuses treatment 3 consecutive times without medical reason, if treatment goals not met, if there is a change in medical status, if patient makes no progress towards goals or if patient is discharged from hospital.  The above assessment, treatment plan, treatment alternatives and goals were discussed and mutually agreed upon: by patient  Leonard [name] 02/16/2021, 10:35 AM

## 2021-02-16 NOTE — Plan of Care (Signed)
  Problem: RH Balance Goal: LTG Patient will maintain dynamic sitting balance (PT) Description: LTG:  Patient will maintain dynamic sitting balance with assistance during mobility activities (PT) Flowsheets (Taken 02/16/2021 1753) LTG: Pt will maintain dynamic sitting balance during mobility activities with:: Independent with assistive device  Goal: LTG Patient will maintain dynamic standing balance (PT) Description: LTG:  Patient will maintain dynamic standing balance with assistance during mobility activities (PT) Flowsheets (Taken 02/16/2021 1753) LTG: Pt will maintain dynamic standing balance during mobility activities with:: Supervision/Verbal cueing   Problem: RH Bed Mobility Goal: LTG Patient will perform bed mobility with assist (PT) Description: LTG: Patient will perform bed mobility with assistance, with/without cues (PT). Flowsheets (Taken 02/16/2021 1753) LTG: Pt will perform bed mobility with assistance level of: Supervision/Verbal cueing   Problem: RH Bed to Chair Transfers Goal: LTG Patient will perform bed/chair transfers w/assist (PT) Description: LTG: Patient will perform bed to chair transfers with assistance (PT). Flowsheets (Taken 02/16/2021 1753) LTG: Pt will perform Bed to Chair Transfers with assistance level: Independent with assistive device    Problem: RH Car Transfers Goal: LTG Patient will perform car transfers with assist (PT) Description: LTG: Patient will perform car transfers with assistance (PT). Flowsheets (Taken 02/16/2021 1753) LTG: Pt will perform car transfers with assist:: Supervision/Verbal cueing   Problem: RH Ambulation Goal: LTG Patient will ambulate in controlled environment (PT) Description: LTG: Patient will ambulate in a controlled environment, # of feet with assistance (PT). Flowsheets (Taken 02/16/2021 1753) LTG: Pt will ambulate in controlled environ  assist needed:: Supervision/Verbal cueing LTG: Ambulation distance in controlled  environment: 169ft Goal: LTG Patient will ambulate in home environment (PT) Description: LTG: Patient will ambulate in home environment, # of feet with assistance (PT). Flowsheets (Taken 02/16/2021 1753) LTG: Pt will ambulate in home environ  assist needed:: Supervision/Verbal cueing LTG: Ambulation distance in home environment: 23ft with LRAD   Problem: RH Wheelchair Mobility Goal: LTG Patient will propel w/c in controlled environment (PT) Description: LTG: Patient will propel wheelchair in controlled environment, # of feet with assist (PT) Flowsheets (Taken 02/16/2021 1753) LTG: Pt will propel w/c in controlled environ  assist needed:: Minimal Assistance - Patient > 75% LTG: Propel w/c distance in controlled environment: 117ft   Problem: RH Stairs Goal: LTG Patient will ambulate up and down stairs w/assist (PT) Description: LTG: Patient will ambulate up and down # of stairs with assistance (PT) Flowsheets (Taken 02/16/2021 1753) LTG: Pt will ambulate up/down stairs assist needed:: Contact Guard/Touching assist LTG: Pt will  ambulate up and down number of stairs: 4 steps with 1 UE Support

## 2021-02-16 NOTE — Evaluation (Signed)
Occupational Therapy Assessment and Plan  Patient Details  Name: Leonard [name] MRN: [id number] Date of Birth: Dec 19, 1955  OT Diagnosis: abnormal posture, cognitive deficits, disturbance of vision, hemiplegia affecting non-dominant side and decreased activity tolerance and dynamic standing balance Rehab Potential: Rehab Potential (ACUTE ONLY): Good ELOS: 2.5 weeks   Today's Date: 02/16/2021 OT Individual Time: 3154-0086 OT Individual Time Calculation (min): 53 min     Hospital Problem: Active Problems:   Right middle cerebral artery stroke [facility])   Past Medical History: History reviewed. No pertinent past medical history. Past Surgical History:  Past Surgical History:  Procedure Laterality Date  . IR ANGIO INTRA EXTRACRAN SEL COM CAROTID INNOMINATE UNI L MOD SED  01/25/2021  . IR CT HEAD LTD  01/25/2021  . IR CT HEAD LTD  01/25/2021  . IR PERCUTANEOUS ART THROMBECTOMY/INFUSION INTRACRANIAL INC DIAG ANGIO  01/25/2021  . RADIOLOGY WITH ANESTHESIA N/A 01/24/2021   Procedure: IR WITH ANESTHESIA;  Surgeon: Luanne Bras, MD;  Location: [place];  Service: Radiology;  Laterality: N/A;    Assessment & Plan Clinical Impression: Patient is a 65 y.o. year old male with unremarkable past medical history. He lives with spouse independent prior to admission. He has a son that lives nearby. 1 level home 4 steps to entry. Presented 01/24/2021 with acute onset of left-sided weakness dysarthria and facial droop. Noted blood pressure severely elevated to 32/124. 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 Patient underwent revascularization per interventional radiology. Follow-up MRI 01/25/2021 showed a large acute infarct right MCA  territory involving the frontal operculum insula and basal ganglia. No hemorrhage or mass-effect. Punctate foci of acute ischemia in the right temporal lobe and right thalamus. MRA was unremarkable. Echocardiogram with ejection fraction of 60 to 65% no wall motion abnormalities. Currently maintained on aspirin for CVA prophylaxis as well as Brilinta. Hospital course complicated by dysphagia patient n.p.o. alternative means of nutritional support and now has been advanced to dysphagia #2 nectar thick liquid diet.. Cleviprex added for blood pressure control. Hospital course complicated by acute hypoxic respiratory failure due to COVID-19 pneumonia possible aspiration. Hypoxia resolved treated with steroids/remdesivir/Actemraand broad-spectrum antimicrobial therapy. Note he was unvaccinated. Context infectious disease no need for isolation from 02/14/2021 onwards. He did experience some urinary retention with a Foley tube inserted hand placed on Flomax.Therapy evaluations completed due to patient's left-sided weakness dysphagia and dysarthria was admitted for a comprehensive rehab program.  Patient transferred to CIR on 02/15/2021 .    Patient currently requires max with basic self-care skills secondary to muscle weakness, decreased cardiorespiratoy endurance, impaired timing and sequencing, abnormal tone, unbalanced muscle activation, motor apraxia, ataxia, decreased coordination and decreased motor planning, decreased visual perceptual skills, decreased attention to left, left side neglect, decreased motor planning and ideational apraxia, decreased initiation, decreased awareness, decreased problem solving, decreased safety awareness, decreased memory and delayed processing and decreased standing balance, decreased postural control, hemiplegia and decreased balance strategies.  Prior to hospitalization, patient could complete BADL/IADL with independent .  Patient will benefit from skilled  intervention to decrease level of assist with basic self-care skills and increase independence with basic self-care skills prior to discharge home with care partner.  Anticipate patient will require 24 hour supervision and follow up home health.  OT - End of Session Activity Tolerance: Tolerates 10 - 20 min activity with multiple rests Endurance Deficit: Yes Endurance Deficit Description: req increased time and to be seated to complete ADL OT Assessment Rehab Potential (ACUTE ONLY): Good OT Barriers to Discharge: Decreased caregiver support OT Patient demonstrates impairments in the following area(s): Balance;Behavior;Cognition;Endurance;Motor;Perception;Safety;Vision;Skin Integrity;Sensory OT Basic ADL's Functional Problem(s): Eating;Grooming;Bathing;Dressing;Toileting OT Transfers Functional Problem(s): Toilet;Tub/Shower OT Additional Impairment(s): Fuctional Use of Upper Extremity OT Plan OT Intensity: Minimum of 1-2 x/day, 45 to 90 minutes OT Frequency: 5 out of 7 days OT Duration/Estimated Length of Stay: 2.5 weeks OT Treatment/Interventions: Balance/vestibular training;Disease mangement/prevention;Neuromuscular re-education;Self Care/advanced ADL retraining;Therapeutic Exercise;Wheelchair propulsion/positioning;UE/LE Strength taining/ROM;Skin care/wound managment;Pain management;DME/adaptive equipment instruction;Cognitive remediation/compensation;Community reintegration;Functional electrical stimulation;Patient/family education;Splinting/orthotics;UE/LE Coordination activities;Visual/perceptual remediation/compensation;Therapeutic Activities;Psychosocial support;Functional mobility training;Discharge planning OT Self Feeding Anticipated Outcome(s): mod I OT Basic Self-Care Anticipated Outcome(s): min A OT Toileting Anticipated Outcome(s): CGA OT Bathroom Transfers Anticipated Outcome(s): CGA OT Recommendation Patient destination: Home Follow Up Recommendations: Home health  OT Equipment Recommended: 3 in 1 bedside comode;Tub/shower bench   OT Evaluation Precautions/Restrictions  Precautions Precautions: Fall Precaution Comments: L neglect, LUE hemi, cognitive deficits, nectar thick/dys 2 diet, HOB > 30 degrees Restrictions Weight Bearing Restrictions: No General Chart Reviewed: Yes Family/Caregiver Present: No Pain Pain Assessment Pain Scale: 0-10 Pain Score: 0-No pain Home Living/Prior Functioning Home Living Family/patient expects to be discharged to:: Inpatient rehab Living Arrangements: Spouse/significant other Available Help at Discharge: Family,Available PRN/intermittently Type of Home: House Home Access: Stairs to enter [employer] of Steps: 4 steps in front; 5 steps in back Entrance Stairs-Rails: None Home Layout: One level Bathroom Shower/Tub: Chiropodist: Standard Bathroom Accessibility: Yes Additional Comments: Wife works at a Agricultural [employer]. Adult son lives nearby.  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[place] and H[place]ies[place] IADL Comments: drives Prior Function Level of Independence: Independent with basic ADLs,Independent with gait,Independent with homemaking with ambulation,Independent with transfers  Able to Take Stairs?: Yes Vocation: Retired Surveyor, mining Baseline Vision/History: Wears glasses Wears Glasses: Reading only Patient Visual Report: No change from baseline Vision Assessment?: Yes Eye Alignment: Within Functional Limits Ocular Range of Motion: Restricted on the left Alignment/Gaze Preference: Gaze right;Head turned Tracking/Visual Pursuits: Decreased smoothness of horizontal tracking;Decreased smoothness of eye movement to LEFT inferior field;Decreased smoothness of eye movement to LEFT superior field;Decreased smoothness of vertical tracking;Requires  cues, head turns, or add eye shifts to track Saccades: Additional eye shifts occurred during testing;Decreased speed of saccadic movement Convergence: Impaired (comment) Visual Fields: Other (comment);Impaired-to be further tested in functional context (difficult to fully assess 2/2 difficult following directions) Perception  Perception: Impaired (min to moderate) Inattention/Neglect: Does not attend to left visual field;Does not attend to left side of body Praxis Praxis: Impaired Praxis Impairment Details: Initiation;Motor planning;Perseveration Praxis-Other Comments: Noted perseveration with grooming tasks (washing R hand, retrieving soap multiple times.) Cognition Overall Cognitive Status: Impaired/Different from baseline Arousal/Alertness: Awake/alert Orientation Level: Person;Situation;Place Person: Oriented Place: Oriented Situation: Oriented Year: Other (Comment) (rec 2 choices to accurately select) Month: [place] (rec 2 choices to accurately select) Day of Week: Other (Comment) (rec 2 choices to accurately select) Memory: Impaired Memory Impairment: Retrieval deficit;Storage deficit;Decreased recall of new information;Decreased short term memory Immediate Memory Recall: Sock;Blue;Bed Memory Recall Sock: Without Cue Memory Recall Blue: Without Cue Memory Recall Bed: Without Cue Attention: Sustained Focused Attention: Impaired Focused Attention Impairment: Verbal basic;Verbal complex Sustained Attention: Impaired Sustained Attention Impairment: Verbal  basic;Verbal complex Awareness: Impaired Awareness Impairment: Intellectual impairment Problem Solving: Impaired Problem Solving Impairment: Verbal basic;Verbal complex Executive Function: Organizing;Self Monitoring;Self Correcting Organizing: Impaired Organizing Impairment: Verbal basic Self Monitoring: Impaired Self Monitoring Impairment: Verbal basic Self Correcting: Impaired Self Correcting Impairment: Verbal  basic Behaviors: Perseveration;Lability Safety/Judgment: Impaired Sensation Sensation Light Touch: Impaired Detail Central sensation comments: unable to appreciate LT to L digits, difficulty to fully assess 2/2 cognitive deficits Light Touch Impaired Details: Impaired LUE Hot/Cold: Appears Intact Proprioception: Impaired by gross assessment Stereognosis: Impaired by gross assessment Additional Comments: impaired LUE Coordination Gross Motor Movements are Fluid and Coordinated: No Fine Motor Movements are Fluid and Coordinated: No Coordination and Movement Description: LUE > LLE hemi Finger Nose Finger Test: unable to complete on L side Motor  Motor Motor: Hemiplegia Motor - Skilled Clinical Observations: LUE > LLE hemi  Trunk/Postural Assessment  Cervical Assessment Cervical Assessment: Within Functional Limits Thoracic Assessment Thoracic Assessment: Exceptions to [facility] (rounded shoulders) Lumbar Assessment Lumbar Assessment: Exceptions to [facility] (posterior pelvic tilt) Postural Control Postural Control: Deficits on evaluation (decreased in standing during LBB, posterior LOB into w/c)  Balance Balance Balance Assessed: Yes Static Sitting Balance Static Sitting - Balance Support: Feet supported Static Sitting - Level of Assistance: 5: Stand by assistance Static Sitting - Comment/# of Minutes: S Dynamic Sitting Balance Dynamic Sitting - Balance Support: Feet supported;No upper extremity supported Dynamic Sitting - Level of Assistance: 5: Stand by assistance Dynamic Sitting Balance - Compensations: CGA Static Standing Balance Static Standing - Balance Support: During functional activity;No upper extremity supported Static Standing - Level of Assistance: 4: Min assist Dynamic Standing Balance Dynamic Standing - Balance Support: During functional activity;No upper extremity supported Dynamic Standing - Level of Assistance: 3: Mod assist Extremity/Trunk Assessment RUE  Assessment RUE Assessment: Within Functional Limits LUE Assessment LUE Assessment: Exceptions to [facility] Active Range of Motion (AROM) Comments: ~70 degrees shoulder flexion General Strength Comments: 2-/5 in shoulder/elbow extension LUE Body System: Neuro Brunstrum levels for arm and hand: Arm;Hand Brunstrum level for arm: Stage II Synergy is developing;Stage III Synergy is performed voluntarily Brunstrum level for hand: Stage II Synergy is developing;Stage I Flaccidity  Care Tool Care Tool Self Care Eating   Eating Assist Level: Supervision/Verbal cueing    Oral Care    Oral Care Assist Level: Moderate Assistance - Patient 50 - 74%    Bathing   Body parts bathed by patient: Face;Buttocks Body parts bathed by helper: Right arm;Left arm;Chest;Abdomen;Right upper leg;Left upper leg;Right lower leg;Left lower leg   Assist Level: Total Assistance - Patient < 25%    Upper Body Dressing(including orthotics)   What is the patient wearing?: Pull over shirt   Assist Level: Maximal Assistance - Patient 25 - 49%    Lower Body Dressing (excluding footwear)   What is the patient wearing?: Incontinence brief;Pants Assist for lower body dressing: Maximal Assistance - Patient 25 - 49%    Putting on/Taking off footwear   What is the patient wearing?: Non-skid slipper socks Assist for footwear: Total Assistance - Patient < 25%       Care Tool Toileting Toileting activity   Assist for toileting: Maximal Assistance - Patient 25 - 49%     Care Tool Bed Mobility Roll left and right activity   Roll left and right assist level: Minimal Assistance - Patient > 75%    Sit to lying activity   Sit to lying assist level: Minimal Assistance - Patient > 75%    Lying to sitting edge  of bed activity   Lying to sitting edge of bed assist level: Minimal Assistance - Patient > 75%     Care Tool Transfers Sit to stand transfer   Sit to stand assist level: Moderate Assistance - Patient 50 - 74%     Chair/bed transfer   Chair/bed transfer assist level: Moderate Assistance - Patient 50 - 74%     Toilet transfer   Assist Level: Moderate Assistance - Patient 50 - 74%     Care Tool Cognition Expression of Ideas and Wants Expression of Ideas and Wants: Some difficulty - exhibits some difficulty with expressing needs and ideas (e.g, some words or finishing thoughts) or speech is not clear   Understanding Verbal and Non-Verbal Content Understanding Verbal and Non-Verbal Content: Sometimes understands - understands only basic conversations or simple, direct phrases. Frequently requires cues to understand   Memory/Recall Ability *first 3 days only Memory/Recall Ability *first 3 days only: That he or she is in a hospital/hospital unit;Staff names and faces    Refer to Care Plan for Long Term Goals  SHORT TERM GOAL WEEK 1 OT Short Term Goal 1 (Week 1): Pt will don shirt via hemi-technique with min A. OT Short Term Goal 2 (Week 1): Pt will don pants with mod A via hemi-technique. OT Short Term Goal 3 (Week 1): Pt will complete grooming task with min A in standing.  Recommendations for other services: None    Skilled Therapeutic Intervention ADL ADL Where Assessed-Eating: Bed level Grooming: Moderate assistance Where Assessed-Grooming: Standing at sink;Sitting at sink Upper Body Bathing: Maximal assistance Where Assessed-Upper Body Bathing: Sitting at sink Lower Body Bathing: Maximal assistance Where Assessed-Lower Body Bathing: Sitting at sink;Standing at sink Upper Body Dressing: Maximal assistance Where Assessed-Upper Body Dressing: Sitting at sink Lower Body Dressing: Maximal assistance Where Assessed-Lower Body Dressing: Standing at sink Toileting: Maximal assistance Where Assessed-Toileting: Glass blower/designer: Moderate assistance Toilet Transfer Method: Stand pivot Toilet Transfer Equipment: Raised toilet seat;Grab bars Tub/Shower Transfer: Not assessed Financial planner: Not assessed Mobility  Bed Mobility Bed Mobility: Rolling Right;Right Sidelying to Sit Rolling Right: Minimal Assistance - Patient > 75% Right Sidelying to Sit: Minimal Assistance - Patient > 75% Transfers Sit to Stand: Minimal Assistance - Patient > 75%;Moderate Assistance - Patient 50-74% Stand to Sit: Minimal Assistance - Patient > 75%  Session Note: Pt received sitting up in bed finishing breakfast, denies pain and agreeable to OT eval. Pt became tearful/labile speaking of his recently deceased mother. Reviewed role of CIR OT, evaluation process, ADL/func mobility retraining, goals for therapy, and safety plan. Evaluation completed as documented above with session emphasis on sink-side bathing/dressing and functional mobility. Came to sitting EOB with min A to scoot forward and manage LUE. Noted R gaze preference and inattention of LUE during functional mobility, but later initiating B handwashing. Pt noted to perseverate on washing R hand and retrieving soap, but overall poor initiation of bathing full-body, req assistance to complete. Stand-pivot throughout session with mod A to power up and for initiation/sequencing. Pt reports he would [place] upon DC from the hospital. Demonstrates moderate difficulty following instructions during visual / sensation assessment.  Pt left seated in recliner with safety belt alarm engaged, call bell in reach, and all immediate needs met.   Discharge Criteria: Patient will be discharged from OT if patient refuses treatment 3 consecutive times without medical reason, if treatment goals not met, if there is a  change in medical status, if patient makes no progress towards goals or if patient is discharged from hospital.  The above assessment, treatment plan, treatment alternatives and goals were discussed and mutually agreed upon: by patient  Volanda Napoleon MS, OTR/L  02/16/2021, 10:55 AM

## 2021-02-16 NOTE — Progress Notes (Signed)
Inpatient Rehabilitation Center Individual Statement of Services  Patient Name:  Leonard [name]  Date:  02/16/2021  Welcome to the Inpatient Rehabilitation Center.  Our goal is to provide you with an individualized program based on your diagnosis and situation, designed to meet your specific needs.  With this comprehensive rehabilitation program, you will be expected to participate in at least 3 hours of rehabilitation therapies Monday-Friday, with modified therapy programming on the weekends.  Your rehabilitation program will include the following services:  Physical Therapy (PT), Occupational Therapy (OT), Speech Therapy (ST), 24 hour per day rehabilitation nursing, Therapeutic Recreaction (TR), Neuropsychology, Care Coordinator, Rehabilitation Medicine, Nutrition Services, Pharmacy Services and Other  Weekly team conferences will be held on Wednesdays to discuss your progress.  Your Inpatient Rehabilitation Care Coordinator will talk with you frequently to get your input and to update you on team discussions.  Team conferences with you and your family in attendance [place] also be held.  Expected length of stay: 18-21 Days  Overall anticipated outcome: Supervision to Min A  Depending on your progress and recovery, your program [place] change. Your Inpatient Rehabilitation Care Coordinator will coordinate services and will keep you informed of any changes. Your Inpatient Rehabilitation Care Coordinator's name and contact numbers are listed  below.  The following services Sch[place] also be recommended but are not provided by the Inpatient Rehabilitation Center:    Home Health Rehabiltiation Services  Outpatient Rehabilitation Services    Arrangements will be made to provide these services after discharge if needed.  Arrangements include referral to agencies that provide these services.  Your insurance has been verified to be:  [employer] Your primary doctor is:  NO PCP  Pertinent information will  be shared with your doctor and your insurance company.  Inpatient Rehabilitation Care Coordinator:  Lavera Guise, [place] [phone] or [phone]  Information discussed with and copy given to patient by: Andria Rhein, 02/16/2021, 10:59 AM

## 2021-02-16 NOTE — Evaluation (Signed)
Physical Therapy Assessment and Plan  Patient Details  Name: Leonard [name] MRN: [id number] Date of Birth: 12-Sep-1956  PT Diagnosis: Abnormal posture, Abnormality of gait, Ataxia, Ataxic gait, Coordination disorder, Hemiplegia non-dominant, Impaired cognition, Impaired sensation and Muscle weakness Rehab Potential: Good ELOS: 13-17 days   Today's Date: 02/16/2021 PT Individual Time: 1400-1500 PT Individual Time Calculation (min): 60 min    Hospital Problem: Active Problems:   Right middle cerebral artery stroke [facility])   Past Medical History: History reviewed. No pertinent past medical history. Past Surgical History:  Past Surgical History:  Procedure Laterality Date  . IR ANGIO INTRA EXTRACRAN SEL COM CAROTID INNOMINATE UNI L MOD SED  01/25/2021  . IR CT HEAD LTD  01/25/2021  . IR CT HEAD LTD  01/25/2021  . IR PERCUTANEOUS ART THROMBECTOMY/INFUSION INTRACRANIAL INC DIAG ANGIO  01/25/2021  . RADIOLOGY WITH ANESTHESIA N/A 01/24/2021   Procedure: IR WITH ANESTHESIA;  Surgeon: Luanne Bras, MD;  Location: [place];  Service: Radiology;  Laterality: N/A;    Assessment & Plan Clinical Impression: Patient is a 65 year old right-handed male unremarkable past medical history. He lives with spouse independent prior to admission. He has a son that lives nearby. 1 level home 4 steps to entry. Presented 01/24/2021 with acute onset of left-sided weakness dysarthria and facial droop. Noted blood pressure severely elevated to 32/124. 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 Patient underwent revascularization per interventional radiology. Follow-up MRI 01/25/2021 showed a large acute infarct right MCA territory involving the  frontal operculum insula and basal ganglia. No hemorrhage or mass-effect. Punctate foci of acute ischemia in the right temporal lobe and right thalamus. MRA was unremarkable. Echocardiogram with ejection fraction of 60 to 65% no wall motion abnormalities. Currently maintained on aspirin for CVA prophylaxis as well as Brilinta. Hospital course complicated by dysphagia patient n.p.o. alternative means of nutritional support and now has been advanced to dysphagia #2 nectar thick liquid diet.. Cleviprex added for blood pressure control. Hospital course complicated by acute hypoxic respiratory failure due to COVID-19 pneumonia possible aspiration. Hypoxia resolved treated with steroids/remdesivir/Actemraand broad-spectrum antimicrobial therapy. Note he was unvaccinated. Context infectious disease no need for isolation from 02/14/2021 onwards. He did experience some urinary retention with a Foley tube inserted hand placed on Flomax.Therapy evaluations completed due to patient's left-sided weakness dysphagia and dysarthria was admitted for a comprehensive rehab program.  Patient transferred to CIR on 02/15/2021 .   Patient currently requires mod with mobility secondary to muscle weakness, muscle joint tightness and muscle paralysis, decreased cardiorespiratoy endurance, unbalanced muscle activation, motor apraxia, decreased coordination and decreased motor planning, decreased visual perceptual skills, decreased visual motor skills and field cut, decreased attention to left, decreased initiation, decreased attention, decreased awareness, decreased problem solving, decreased safety awareness, decreased memory and delayed processing and decreased sitting balance, decreased standing balance, decreased postural control, hemiplegia and decreased balance strategies.  Prior to hospitalization, patient was independent  with mobility and lived with Spouse in a Mobile home (double wide) home.  Home access is 4 steps in  front; 5 steps in backStairs to enter.  Patient will benefit from skilled PT intervention to maximize safe functional mobility, minimize fall risk and decrease caregiver burden for planned discharge home with 24 hour supervision.  Anticipate patient will benefit from follow up [place] at discharge.  PT - End of Session Activity Tolerance: Tolerates < 10 min activity, no significant change in vital signs Endurance Deficit: Yes PT Assessment Rehab Potential (ACUTE/IP ONLY): Good PT Barriers to Discharge: [place] home environment;Decreased caregiver support;Home environment access/layout;Insurance for SNF coverage;Behavior;Incontinence;New diabetic PT Patient demonstrates impairments in the following area(s): Balance;Behavior;Edema;Endurance;Motor;Nutrition;Perception;Safety;Sensory;Skin Integrity PT Transfers Functional Problem(s): Bed Mobility;Bed to Chair;Car;Furniture;Floor PT Locomotion Functional Problem(s): Ambulation;Wheelchair Mobility;Stairs PT Plan PT Intensity: Minimum of 1-2 x/day ,45 to 90 minutes PT Frequency: 5 out of 7 days PT Duration Estimated Length of Stay: 13-17 days PT Treatment/Interventions: Ambulation/gait training;Community reintegration;DME/adaptive equipment instruction;Neuromuscular re-education;Psychosocial support;UE/LE Strength taining/ROM;Stair training;Wheelchair propulsion/positioning;UE/LE Coordination activities;Therapeutic Activities;Skin care/wound management;Pain management;Functional electrical stimulation;Discharge planning;Balance/vestibular training;Cognitive remediation/compensation;Disease management/prevention;Functional mobility training;Patient/family education;Visual/perceptual remediation/compensation;Splinting/orthotics;Therapeutic Exercise PT Transfers Anticipated Outcome(s): Supevision assist with LRAD PT Locomotion Anticipated Outcome(s): Ambulatory at supervision assist level PT Recommendation Recommendations for Other Services: Therapeutic  Recreation consult Therapeutic Recreation Interventions: Stress management;Outing/community reintergration Follow Up Recommendations: Home health PT Patient destination: Home Equipment Recommended: To be determined;Quad cane;Cane   PT Evaluation Precautions/Restrictions   fall L inattention General   Vital SignsTherapy Vitals Temp: 97.9 F (36.6 C) Pulse Rate: 98 Resp: 16 BP: 130/81 Patient Position (if appropriate): Lying Oxygen Therapy SpO2: 99 % O2 Device: Room Air Pain   denies Home Living/Prior Functioning Home Living Available Help at Discharge: Family;Available PRN/intermittently Type of Home: Mobile home (double wide) Home Access: Stairs to enter Entrance Stairs-Number of Steps: 4 steps in front; 5 steps in back Entrance Stairs-Rails: Right Home Layout: One level Bathroom Shower/Tub: Chiropodist: Standard Bathroom Accessibility: Yes Additional Comments: Wife works at a Agricultural consultant. Adult son lives nearby.  Lives With: Spouse Prior Function Level of Independence: Independent with basic ADLs;Independent with gait;Independent with homemaking with ambulation;Independent with transfers  Able to Take Stairs?: Yes Driving: Yes Comments: Pt is retired but stays busy. Pt works as a Pension scheme manager. Vision/Perception  Vision - Assessment Eye Alignment: Within Functional Limits Ocular Range of Motion: Restricted on the left Tracking/Visual Pursuits: Decreased smoothness of horizontal tracking;Decreased smoothness of eye movement to LEFT inferior field;Decreased smoothness of eye movement to LEFT superior field;Decreased smoothness of vertical tracking;Requires cues, head turns, or add eye shifts to track Praxis Praxis: Impaired Praxis Impairment Details: Ideomotor;Motor planning  Cognition Overall Cognitive Status: Impaired/Different from baseline Arousal/Alertness: Awake/alert Attention: Focused;Sustained Focused Attention: Impaired Focused  Attention Impairment: Verbal basic;Verbal complex Sustained Attention: Impaired Sustained Attention Impairment: Verbal basic;Verbal complex Memory: Impaired Memory Impairment: Retrieval deficit;Storage deficit;Decreased recall of new information;Decreased short term memory Awareness: Impaired Awareness Impairment: Intellectual impairment Problem Solving: Impaired Problem Solving Impairment: Verbal basic;Verbal complex Executive Function: Organizing;Self Monitoring;Self Correcting Organizing: Impaired Organizing Impairment: Verbal basic Self Monitoring: Impaired Self Monitoring Impairment: Verbal basic Self Correcting: Impaired Self Correcting Impairment: Verbal basic Safety/Judgment: Impaired Sensation Sensation Central sensation comments: unable to appreciate LT to L digits, difficulty to fully assess 2/2 cognitive deficits Light Touch Impaired Details: Impaired LLE;Impaired LUE Proprioception: Impaired by gross assessment Coordination Gross Motor Movements are Fluid and Coordinated: No Coordination and Movement Description: hemiplegia LUE > LLE hemi Finger Nose Finger Test: unable to complete on L side Heel Shin Test: mild ataxia and ROM deficits on the LLE due to hip weakness. Motor  Motor Motor: Hemiplegia;Ataxia Motor - Skilled Clinical Observations: LUE > LLE hemi   Trunk/Postural Assessment  Cervical Assessment Cervical Assessment: Within Functional Limits Thoracic Assessment Thoracic Assessment: Exceptions to [facility] (rounded shoulders) Lumbar Assessment Lumbar Assessment: Exceptions  to [facility] (posterior pelvic tilt) Postural Control Postural Control: Deficits on evaluation (decreased in standing during LBB, posterior LOB into w/c)  Balance Balance Balance Assessed: Yes Static Sitting Balance Static Sitting - Balance Support: Feet supported Static Sitting - Level of Assistance: 5: Stand by assistance Dynamic Sitting Balance Dynamic Sitting - Balance Support: Feet  supported;No upper extremity supported Dynamic Sitting - Level of Assistance: 5: Stand by assistance Dynamic Sitting Balance - Compensations: CGA Static Standing Balance Static Standing - Balance Support: During functional activity;No upper extremity supported Static Standing - Level of Assistance: 4: Min assist Dynamic Standing Balance Dynamic Standing - Balance Support: During functional activity;No upper extremity supported Dynamic Standing - Level of Assistance: 3: Mod assist;4: Min assist Extremity Assessment      RLE Assessment RLE Assessment: Within Functional Limits General Strength Comments: grossly 4+/5 proximal to distal LLE Assessment LLE Assessment: Exceptions to [facility] General Strength Comments: grossly 4/5 proximal to distal  Care Tool Care Tool Bed Mobility Roll left and right activity   Roll left and right assist level: Minimal Assistance - Patient > 75%    Sit to lying activity   Sit to lying assist level: Minimal Assistance - Patient > 75%    Lying to sitting edge of bed activity   Lying to sitting edge of bed assist level: Minimal Assistance - Patient > 75%     Care Tool Transfers Sit to stand transfer   Sit to stand assist level: Moderate Assistance - Patient 50 - 74%    Chair/bed transfer   Chair/bed transfer assist level: Moderate Assistance - Patient 50 - 74%     Physiological scientist transfer assist level: Moderate Assistance - Patient 50 - 74%      Care Tool Locomotion Ambulation   Assist level: Moderate Assistance - Patient 50 - 74% Assistive device: Hand held assist Max distance: 50  Walk 10 feet activity   Assist level: Moderate Assistance - Patient - 50 - 74% Assistive device: Walker-rolling   Walk 50 feet with 2 turns activity   Assist level: Moderate Assistance - Patient - 50 - 74% Assistive device: Hand held assist  Walk 150 feet activity Walk 150 feet activity did not occur: Safety/medical concerns       Walk 10 feet on uneven surfaces activity Walk 10 feet on uneven surfaces activity did not occur: Safety/medical concerns      Stairs Stair activity did not occur: Safety/medical concerns        Walk up/down 1 step activity Walk up/down 1 step or curb (drop down) activity did not occur: Safety/medical concerns     Walk up/down 4 steps activity did not occuR: Safety/medical concerns  Walk up/down 4 steps activity      Walk up/down 12 steps activity Walk up/down 12 steps activity did not occur: Safety/medical concerns      Pick up small objects from floor Pick up small object from the floor (from standing position) activity did not occur: Safety/medical concerns      Wheelchair Will patient use wheelchair at discharge?: No Type of Wheelchair: Manual   Wheelchair assist level: Total Assistance - Patient < 25% Max wheelchair distance: 150  Wheel 50 feet with 2 turns activity   Assist Level: Total Assistance - Patient < 25%  Wheel 150 feet activity   Assist Level: Total Assistance - Patient < 25%    Refer to Care Plan for Long Term Goals  SHORT TERM GOAL WEEK 1 PT Short Term Goal 1 (Week 1): Pt will performed bed mobiltiy with superivsion assist PT Short Term Goal 2 (Week 1): Pt will transfer to [facility] with CGA and LRAD PT Short Term Goal 3 (Week 1): Pt will ambulate 175f with CGA and LRAD PT Short Term Goal 4 (Week 1): Pt will ascend 4 steps with 1 rail and min assist PT Short Term Goal 5 (Week 1): pt will improve 5xSTS to 27 sec to demonstrate decreased fall risk  Recommendations for other services: Therapeutic Recreation  Stress management and Outing/community reintegration  Skilled Therapeutic Intervention  Pt received sitting in WC and agreeable to PT. PT instructed patient in PT Evaluation and initiated treatment intervention; see above for results. PT educated [place] in [place] rehab potential, rehab goals, and discharge recommendations along with recommendation for follow-up  rehabilitation services. Gait training with no AD 2x 585fwith min-mod assist for safety in turns. Pt performed 5xSTS: 33 sec (>15 sec indicates increased fall risk) car transfer with mod assist from PT with mod assist from PT. Pt returned to room and performed stand pivot transfer to bed with min assist. Sit>supine completed with min assist for control of the LUE and left supine in bed with call bell in reach and all needs met.      Mobility Bed Mobility Bed Mobility: Rolling Right;Right Sidelying to Sit Rolling Right: Minimal Assistance - Patient > 75% Right Sidelying to Sit: Minimal Assistance - Patient > 75% Transfers Transfers: Stand to Sit;Sit to Stand;Stand Pivot Transfers Sit to Stand: Minimal Assistance - Patient > 75%;Moderate Assistance - Patient 50-74% Stand to Sit: Minimal Assistance - Patient > 75%;Moderate Assistance - Patient 50-74% Stand Pivot Transfers: Moderate Assistance - Patient 50 - 74%;Minimal Assistance - Patient > 75% Transfer (Assistive device): None Locomotion  Gait Ambulation: Yes Gait Assistance: Minimal Assistance - Patient > 75%;Moderate Assistance - Patient 50-74% Gait Distance (Feet): 50 Feet Assistive device: None Gait Gait: Yes Gait Pattern: Impaired Gait Pattern: Step-to pattern;Lateral hip instability;Lateral trunk lean to left Stairs / Additional Locomotion Stairs: Yes Stairs Assistance: Moderate Assistance - Patient 50 - 74% Number of Stairs: 4 Height of Stairs: 6 Ramp: Moderate Assistance - Patient 50 - 74% Wheelchair Mobility Wheelchair Mobility: Yes Wheelchair Assistance: Total Assistance - Patient <25% Wheelchair Parts Management: Needs assistance Distance: 150.   Discharge Criteria: Patient will be discharged from PT if patient refuses treatment 3 consecutive times without medical reason, if treatment goals not met, if there is a change in medical status, if patient makes no progress towards goals or if patient is discharged from  hospital.  The above assessment, treatment plan, treatment alternatives and goals were discussed and mutually agreed upon: by patient  AuLorie Phenix/13/2022, 3:38 PM

## 2021-02-16 NOTE — Plan of Care (Signed)
  Problem: Consults Goal: RH STROKE PATIENT EDUCATION Description: See Patient Education module for education specifics  Outcome: Progressing Goal: Nutrition Consult-if indicated Outcome: Progressing   Problem: RH BOWEL ELIMINATION Goal: RH STG MANAGE BOWEL WITH ASSISTANCE Description: STG Manage Bowel with min Assistance. Outcome: Progressing Goal: RH STG MANAGE BOWEL W/MEDICATION W/ASSISTANCE Description: STG Manage Bowel with Medication with min Assistance. Outcome: Progressing   Problem: RH BLADDER ELIMINATION Goal: RH STG MANAGE BLADDER WITH ASSISTANCE Description: STG Manage Bladder With min Assistance Outcome: Progressing Goal: RH STG MANAGE BLADDER WITH MEDICATION WITH ASSISTANCE Description: STG Manage Bladder With Medication With min Assistance. Outcome: Progressing   Problem: RH SKIN INTEGRITY Goal: RH STG MAINTAIN SKIN INTEGRITY WITH ASSISTANCE Description: STG Maintain Skin Integrity With min Assistance. Outcome: Progressing Goal: RH STG ABLE TO PERFORM INCISION/WOUND CARE W/ASSISTANCE Description: STG Able To Perform skin Care With min Assistance. Outcome: Progressing   Problem: RH SAFETY Goal: RH STG ADHERE TO SAFETY PRECAUTIONS W/ASSISTANCE/DEVICE Description: STG Adhere to Safety Precautions With min Assistance/Device. Outcome: Progressing Goal: RH STG DECREASED RISK OF FALL WITH ASSISTANCE Description: STG Decreased Risk of Fall With cues and reminders. Outcome: Progressing   Problem: RH COGNITION-NURSING Goal: RH STG ANTICIPATES NEEDS/CALLS FOR ASSIST W/ASSIST/CUES Description: STG Anticipates Needs/Calls for Assist With Cues and reminders. Outcome: Progressing   Problem: RH PAIN MANAGEMENT Goal: RH STG PAIN MANAGED AT OR BELOW PT'S PAIN GOAL Description: <3 on a 0-10 pain scale. Outcome: Progressing   Problem: RH KNOWLEDGE DEFICIT Goal: RH STG INCREASE KNOWLEDGE OF DYSPHAGIA/FLUID INTAKE Description: Patient will be able to demonstrate safe  swallowing techniques, proper thickening techniques to the correct consistency with educational materials, handouts, and demonstration provided by staff, at discharge independently. Outcome: Progressing Goal: RH STG INCREASE KNOWLEGDE OF HYPERLIPIDEMIA Description: Patient will be able to demonstrate knowledge of HLD medications, proper diet, lab values with educational materials and handouts provided by staff, at discharge independently. Outcome: Progressing Goal: RH STG INCREASE KNOWLEDGE OF STROKE PROPHYLAXIS Description: Patient will be able to demonstrate knowledge of medications used to prevent future strokes with educational materials and handouts provided by staff, at discharge independently. Outcome: Progressing   

## 2021-02-16 NOTE — Progress Notes (Signed)
Inpatient Rehabilitation Care Coordinator Assessment and Plan Patient Details  Name: Leonard [name] MRN: [id number] Date of Birth: [birth year]  Today's Date: 02/16/2021  Hospital Problems: Active Problems:   Right middle cerebral artery stroke [facility])  Past Medical History: History reviewed. No pertinent past medical history. Past Surgical History:  Past Surgical History:  Procedure Laterality Date  . IR ANGIO INTRA EXTRACRAN SEL COM CAROTID INNOMINATE UNI L MOD SED  01/25/2021  . IR CT HEAD LTD  01/25/2021  . IR CT HEAD LTD  01/25/2021  . IR PERCUTANEOUS ART THROMBECTOMY/INFUSION INTRACRANIAL INC DIAG ANGIO  01/25/2021  . RADIOLOGY WITH ANESTHESIA N/A 01/24/2021   Procedure: IR WITH ANESTHESIA;  Surgeon: Luanne Bras, MD;  Location: [place];  Service: Radiology;  Laterality: N/A;   Social History:  reports that he has never smoked. He has never used smokeless tobacco. He reports that he does not drink alcohol and does not use drugs.  Family / Support Systems Patient Roles: Spouse Spouse/Significant Other: Santiago Glad Children: Robbie Anticipated Caregiver: spouse and son Ability/Limitations of Caregiver: works during the day Caregiver Availability: Intermittent  Social History Preferred language: English Religion:  Read: Yes Write: Yes Employment Status: Retired Public relations account executive Issues: n/a Guardian/Conservator: n/a   Abuse/Neglect Abuse/Neglect Assessment Can Be Completed: Yes Physical Abuse: Denies Verbal Abuse: Denies Sexual Abuse: Denies Exploitation of patient/patient's resources: Denies Self-Neglect: Denies  Emotional Status Recent Psychosocial Issues: n/a Psychiatric History: n/a Substance Abuse History: n/a  Patient / Family Perceptions, Expectations & Goals Pt/Family understanding of illness & functional limitations: yes Premorbid pt/family roles/activities: pt previously independent, driving daily Anticipated changes in  roles/activities/participation: family able to assisit with task Pt/family expectations/goals: Glenn: None Premorbid Home Care/DME Agencies: None Transportation available at discharge: family able to transport  Discharge Planning Living Arrangements: Children Support Systems: Children,Spouse/significant other Type of Residence: Private residence (1 level home, 4 steps to enter (son lives near)) Insurance Resources: [employer]er, [employer]ell[employer] Medicare) Financial Screen Referred: No Living Expenses: Own Money Management: Patient,Spouse Does the patient have any problems obtaining your medications?: No Home Management: independent Patient/Family Preliminary Plans: Family able to asssit Care Coordinator Barriers to Discharge: Insurance for SNF coverage,Lack of/limited family support Care Coordinator Anticipated Follow Up Needs: HH/OP Expected length of stay: 18-21 Days  Clinical Impression sw met pt, introduce self, pt falling asleep. Sw left contact information in pt room. Called pt spouse Santiago Glad). Goal to discharge to 1 level home, 4 steps to enter. Pt spouse works during the day, pt will need to need MOD I due to having intermittent A at d/c. Sw will continue to follow up with additional questions or concerns.  Dyanne Iha 02/16/2021, 12:25 PM

## 2021-02-16 NOTE — Plan of Care (Signed)
  Problem: RH Balance Goal: LTG Patient will maintain dynamic standing with ADLs (OT) Description: LTG:  Patient will maintain dynamic standing balance with assist during activities of daily living (OT)  Flowsheets (Taken 02/16/2021 1033) LTG: Pt will maintain dynamic standing balance during ADLs with: Contact Guard/Touching assist   Problem: Sit to Stand Goal: LTG:  Patient will perform sit to stand in prep for activites of daily living with assistance level (OT) Description: LTG:  Patient will perform sit to stand in prep for activites of daily living with assistance level (OT) Flowsheets (Taken 02/16/2021 1033) LTG: PT will perform sit to stand in prep for activites of daily living with assistance level: Contact Guard/Touching assist   Problem: RH Eating Goal: LTG Patient will perform eating w/assist, cues/equip (OT) Description: LTG: Patient will perform eating with assist, with/without cues using equipment (OT) Flowsheets (Taken 02/16/2021 1033) LTG: Pt will perform eating with assistance level of: Independent with assistive device    Problem: RH Bathing Goal: LTG Patient will bathe all body parts with assist levels (OT) Description: LTG: Patient will bathe all body parts with assist levels (OT) Flowsheets (Taken 02/16/2021 1033) LTG: Pt will perform bathing with assistance level/cueing: Minimal Assistance - Patient > 75%   Problem: RH Dressing Goal: LTG Patient will perform upper body dressing (OT) Description: LTG Patient will perform upper body dressing with assist, with/without cues (OT). Flowsheets (Taken 02/16/2021 1033) LTG: Pt will perform upper body dressing with assistance level of: Minimal Assistance - Patient > 75% Goal: LTG Patient will perform lower body dressing w/assist (OT) Description: LTG: Patient will perform lower body dressing with assist, with/without cues in positioning using equipment (OT) Flowsheets (Taken 02/16/2021 1033) LTG: Pt will perform lower body  dressing with assistance level of: Contact Guard/Touching assist   Problem: RH Toileting Goal: LTG Patient will perform toileting task (3/3 steps) with assistance level (OT) Description: LTG: Patient will perform toileting task (3/3 steps) with assistance level (OT)  Flowsheets (Taken 02/16/2021 1033) LTG: Pt will perform toileting task (3/3 steps) with assistance level: Contact Guard/Touching assist   Problem: RH Functional Use of Upper Extremity Goal: LTG Patient will use RT/LT upper extremity as a (OT) Description: LTG: Patient will use right/left upper extremity as a stabilizer/gross assist/diminished/nondominant/dominant level with assist, with/without cues during functional activity (OT) Flowsheets (Taken 02/16/2021 1033) LTG: Use of upper extremity in functional activities: LUE as gross assist level LTG: Pt will use upper extremity in functional activity with assistance level of: Minimal Assistance - Patient > 75%   Problem: RH Toilet Transfers Goal: LTG Patient will perform toilet transfers w/assist (OT) Description: LTG: Patient will perform toilet transfers with assist, with/without cues using equipment (OT) Flowsheets (Taken 02/16/2021 1033) LTG: Pt will perform toilet transfers with assistance level of: Contact Guard/Touching assist   Problem: RH Tub/Shower Transfers Goal: LTG Patient will perform tub/shower transfers w/assist (OT) Description: LTG: Patient will perform tub/shower transfers with assist, with/without cues using equipment (OT) Flowsheets (Taken 02/16/2021 1033) LTG: Pt will perform tub/shower stall transfers with assistance level of: Contact Guard/Touching assist

## 2021-02-16 NOTE — IPOC Note (Signed)
Overall Plan of Care [facility]) Patient Details Name: Leonard [name] MRN: [id number] DOB: [birth year]  Admitting Diagnosis: <principal problem not specified>  Hospital Problems: Active Problems:   Right middle cerebral artery stroke [facility])     Functional Problem List: Nursing Behavior,Bladder,Bowel,Edema,Endurance,Medication Management,Nutrition,Perception,Safety,Skin Integrity,Pain  PT Balance,Behavior,Edema,Endurance,Motor,Nutrition,Perception,Safety,Sensory,Skin Integrity  OT Balance,Behavior,Cognition,Endurance,Motor,Perception,Safety,Vision,Skin Integrity,Sensory  SLP Cognition,Safety  TR         Basic ADL's: OT Eating,Grooming,Bathing,Dressing,Toileting     Advanced  ADL's: OT       Transfers: PT Bed Mobility,Bed to Chair,Car,Furniture,Floor  OT Toilet,Tub/Shower     Locomotion: PT Ambulation,Wheelchair Mobility,Stairs     Additional Impairments: OT Fuctional Use of Upper Extremity  SLP Swallowing      TR      Anticipated Outcomes Item Anticipated Outcome  Self Feeding mod I  Swallowing  Supervision   Basic self-care  min A  Toileting  CGA   Bathroom Transfers CGA  Bowel/Bladder  min assist  Transfers  Supevision assist with LRAD  Locomotion  Ambulatory at supervision assist level  Communication  Supvervision  Cognition  Supervision  Pain  <3  Safety/Judgment  min assist with no falls   Therapy Plan: PT Intensity: Minimum of 1-2 x/day ,45 to 90 minutes PT Frequency: 5 out of 7 days PT Duration Estimated Length of Stay: 13-17 days OT Intensity: Minimum of 1-2 x/day, 45 to 90 minutes OT Frequency: 5 out of 7 days OT Duration/Estimated Length of Stay: 2.5 weeks SLP Intensity: Minumum of 1-2 x/day, 30 to 90 minutes SLP Frequency: 3 to 5 out of 7 days SLP Duration/Estimated Length of Stay: 18-21 days   Due to the current state of emergency, patients [place] not be receiving their 3-hours of Medicare-mandated therapy.   Team Interventions: Nursing  Interventions Patient/Family Education,Bladder Management,Bowel Management,Disease Management/Prevention,Pain Management,Medication Management,Skin Care/Wound Management,Cognitive Remediation/Compensation,Dysphagia/Aspiration Precaution Training,Discharge Planning,Psychosocial Support  PT interventions Ambulation/gait training,Community Teaching laboratory technician re-education,Psychosocial support,UE/LE Strength taining/ROM,Stair training,Wheelchair propulsion/positioning,UE/LE Coordination activities,Therapeutic Activities,Skin care/wound Engineer, petroleum stimulation,Discharge planning,Balance/vestibular training,Cognitive remediation/compensation,Disease management/prevention,Functional mobility training,Patient/family education,Visual/perceptual remediation/compensation,Splinting/orthotics,Therapeutic Exercise  OT Interventions Balance/vestibular training,Disease Probation officer re-education,Self Care/advanced ADL retraining,Therapeutic Exercise,Wheelchair propulsion/positioning,UE/LE Strength taining/ROM,Skin care/wound IT sales professional instruction,Cognitive remediation/compensation,Community reintegration,Functional electrical stimulation,Patient/family education,Splinting/orthotics,UE/LE Coordination activities,Visual/perceptual remediation/compensation,Therapeutic Activities,Psychosocial support,Functional mobility training,Discharge planning  SLP Interventions Cognitive remediation/compensation,Speech/Language facilitation,Therapeutic Activities,Therapeutic Exercise,Functional tasks,Cueing hierarchy,Dysphagia/aspiration precaution training,Medication managment,Patient/family education  TR Interventions    SW/CM Interventions Discharge Planning,Psychosocial Support,Patient/Family Education,Disease Management/Prevention   Barriers to Discharge MD  Medical stability  Nursing Decreased  caregiver support,Home environment access/layout,Incontinence,Lack of/limited family support,Weight,Medication compliance,Behavior,Nutrition means    PT Inaccessible home environment,Decreased caregiver support,Home environment Soil scientist for SNF coverage,Behavior,Incontinence,New diabetic    OT Decreased caregiver support    SLP      SW Insurance for SNF coverage,Lack of/limited family support     Team Discharge Planning: Destination: PT-Home ,OT- Home , SLP-Home Projected Follow-up: PT-Home health PT, OT-  Home health OT, SLP-Outpatient SLP,Home Health SLP Projected Equipment Needs: PT-To be determined,Quad cane,Cane, OT- 3 in 1 bedside comode,Tub/shower bench, SLP-To be determined Equipment Details: PT- , OT-  Patient/family involved in discharge planning: PT- Patient,  OT-Patient, SLP-Patient  MD ELOS: 18-21d Medical Rehab Prognosis:  Good Assessment:  65 year old right-handed male unremarkable past medical history. He lives with spouse independent prior to admission. He has a son that lives nearby. 1 level home 4 steps to entry. Presented 01/24/2021 with acute onset of left-sided weakness dysarthria and facial droop. Noted blood pressure severely elevated to 32/124. 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 Patient underwent revascularization per interventional radiology. Follow-up MRI 01/25/2021 showed a large acute infarct right MCA territory involving the frontal operculum insula and basal ganglia. No hemorrhage or mass-effect. Punctate foci of acute ischemia in the right temporal lobe and right thalamus. MRA was unremarkable. Echocardiogram with ejection fraction of 60 to 65% no wall motion  abnormalities. Currently maintained on aspirin for CVA prophylaxis as well as Brilinta. Hospital course complicated by dysphagia patient n.p.o. alternative means of nutritional support and now has been advanced to dysphagia #2 nectar thick liquid diet.. Cleviprex added for blood pressure control. Hospital course complicated by acute hypoxic respiratory failure due to COVID-19 pneumonia possible aspiration. Hypoxia resolved treated with steroids/remdesivir/Actemraand broad-spectrum antimicrobial therapy. Note he was unvaccinated. Context infectious disease no need for isolation from 02/14/2021 onwards. He did experience some urinary retention with a Foley tube inserted hand placed on Flomax.Therapy evaluations completed due to patient's left-sided weakness dysphagia and dysarthria was admitted for a comprehensive rehab program.  Pt reports he "hasn't pooped in at least 4 days"-but LBM documented 5/11 in early AM; Was only medium and prior was 5/9- medium; ate 90% lunch Denies pain and spasms.  Using condom cath to void- voiding well.   See Team Conference Notes for weekly updates to the plan of care

## 2021-02-16 NOTE — Progress Notes (Signed)
PROGRESS NOTE   Subjective/Complaints:    Objective:   DG Swallowing Func-Speech Pathology  Result Date: 02/14/2021 Objective Swallowing Evaluation: Type of Study: MBS-Modified Barium Swallow Study  Patient Details Name: Leonard [name] MRN: [id number] Date of Birth: 10-04-1956 Today's Date: 02/14/2021 Time: SLP Start Time (ACUTE ONLY): 1354 -SLP Stop Time (ACUTE ONLY): 1418 SLP Time Calculation (min) (ACUTE ONLY): 24.88 min Past Medical History: No past medical history on file. Past Surgical History: Past Surgical History: Procedure Laterality Date . IR ANGIO INTRA EXTRACRAN SEL COM CAROTID INNOMINATE UNI L MOD SED  01/25/2021 . IR CT HEAD LTD  01/25/2021 . IR CT HEAD LTD  01/25/2021 . IR PERCUTANEOUS ART THROMBECTOMY/INFUSION INTRACRANIAL INC DIAG ANGIO  01/25/2021 . RADIOLOGY WITH ANESTHESIA N/A 01/24/2021  Procedure: IR WITH ANESTHESIA;  Surgeon: Julieanne Cotton, MD;  Location: [facility] OR;  Service: Radiology;  Laterality: N/A; HPI: Pt is a 64 y.o. male with no significant PMHx who presented via EMS as a code stroke for acute onset of left hemiplegia, left facial droop, dysarthria and left hemineglect. NIHSS was 18 on admission and tPA was given. 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 Pt s/p  s/p bilateral common carotid arteriograms followed by right MCA/ACA/ICA thrombus retrieval with stent angioplasty 4/21. Pt was intubated for procedure. CT head 4/22: Large area of hypoattenuation at the site of known right MCA territory infarct. Small focus of parenchymal hyperdensity at the posterior aspect of the infarct site Quadros indicate a small amount of petechial hemorrhage or contrast extravasation from procedure. Small amount of subarachnoid hyperdensity over the right frontal convexity, either subarachnoid blood or contrast taining. MBS 4/26: a moderate oral and moderate to  severe pharyngeal dysphagia with sensory and motor deficits of neurogenic etiology.  Subjective: -- (alert) Assessment / Plan / Recommendation CHL IP CLINICAL IMPRESSIONS 02/14/2021 Clinical Impression Pt presents with improved oropharyngeal function compared to that noted during the last MBS on 01/30/21. Pt demonstrated oropharyngeal dysphagia characterized by reduced labial seal and stripping, weak manipulation, impaired bolus propulsion, reduced bolus cohesion, a pharyngeal delay, premature spillage to the valleculae with intermittent spillover to the pyriform sinuses, reduced anterior laryngeal movement, and reduced pharyngeal constriction. He demonstrated anterior spillage to the left, residue in the left lateral sulcus, difficulty with A-P transport, vallecular residue, pyriform sinuse residue, and posterior pharyngeal wall residue. He inermittently demonstrated incomplete epiglottic inversion which facilitated penetration (PAS 3) and occasional aspiration (PAS 7) of thin liquids and once with a dual consistency bolus (i.Leonard., nectar thick liquids with masticated regular textures). Pt's cough was inconsistently delayed and variability in effectiveness was also noted. No functional benefit was noted with pt's independent use of a posterior head tilt to facilitate A-P transport of solids. Regular textures, a barium tablet, and dysphagia 3 solids were ultimately removed from the oral cavity after multiple unsuccessful attempts at A-P transport. Amount of pharyngeal residue increased with advancement of solids, but was improved with a liquid wash and independent secondary swallows. A dysphagia 2 diet with nectar thick liquids will be initiated at this time. SLP is hopeful that G-tube placement can be avoided if  p.o. intake is tolerated and quantity is adequate. Pt's results and recommendations were discussed with Dr. Jerral Ralph and Ralene Muskrat, [facility]; it was agreed that pt's Cortrak will be removed, and that G-tube  placement will reassessed on Monday. SLP will continue to follow pt for treatment. SLP Visit Diagnosis Dysphagia, oropharyngeal phase (R13.12) Attention and concentration deficit following -- Frontal lobe and executive function deficit following -- Impact on safety and function --   CHL IP TREATMENT RECOMMENDATION 02/14/2021 Treatment Recommendations Therapy as outlined in treatment plan below   Prognosis 02/14/2021 Prognosis for Safe Diet Advancement Good Barriers to Reach Goals Cognitive deficits;Severity of deficits Barriers/Prognosis Comment -- CHL IP DIET RECOMMENDATION 02/14/2021 SLP Diet Recommendations Dysphagia 2 (Fine chop) solids;Nectar thick liquid Liquid Administration via Cup;No straw Medication Administration Crushed with puree Compensations Slow rate;Small sips/bites;Multiple dry swallows after each bite/sip;Follow solids with liquid Postural Changes Seated upright at 90 degrees   CHL IP OTHER RECOMMENDATIONS 02/14/2021 Recommended Consults -- Oral Care Recommendations Oral care BID Other Recommendations Order thickener from pharmacy   CHL IP FOLLOW UP RECOMMENDATIONS 02/14/2021 Follow up Recommendations Inpatient Rehab   CHL IP FREQUENCY AND DURATION 02/14/2021 Speech Therapy Frequency (ACUTE ONLY) min 2x/week Treatment Duration 2 weeks      CHL IP ORAL PHASE 02/14/2021 Oral Phase Impaired Oral - Pudding Teaspoon -- Oral - Pudding Cup -- Oral - Honey Teaspoon -- Oral - Honey Cup Weak lingual manipulation;Lingual/palatal residue;Decreased bolus cohesion;Premature spillage;Reduced posterior propulsion Oral - Nectar Teaspoon -- Oral - Nectar Cup Weak lingual manipulation;Lingual/palatal residue;Decreased bolus cohesion;Premature spillage;Reduced posterior propulsion;Left anterior bolus loss Oral - Nectar Straw Weak lingual manipulation;Lingual/palatal residue;Decreased bolus cohesion;Premature spillage;Reduced posterior propulsion;Left anterior bolus loss Oral - Thin Teaspoon -- Oral - Thin Cup Weak lingual  manipulation;Lingual/palatal residue;Decreased bolus cohesion;Premature spillage;Reduced posterior propulsion;Left anterior bolus loss Oral - Thin Straw Weak lingual manipulation;Lingual/palatal residue;Decreased bolus cohesion;Premature spillage;Reduced posterior propulsion;Left anterior bolus loss Oral - Puree Weak lingual manipulation;Lingual/palatal residue;Decreased bolus cohesion;Reduced posterior propulsion;Left anterior bolus loss Oral - Mech Soft Weak lingual manipulation;Lingual/palatal residue;Reduced posterior propulsion Oral - Regular Weak lingual manipulation;Lingual/palatal residue;Reduced posterior propulsion Oral - Multi-Consistency -- Oral - Pill Weak lingual manipulation;Lingual/palatal residue;Reduced posterior propulsion Oral Phase - Comment --  CHL IP PHARYNGEAL PHASE 02/14/2021 Pharyngeal Phase Impaired Pharyngeal- Pudding Teaspoon -- Pharyngeal -- Pharyngeal- Pudding Cup -- Pharyngeal -- Pharyngeal- Honey Teaspoon -- Pharyngeal -- Pharyngeal- Honey Cup Reduced epiglottic inversion;Reduced anterior laryngeal mobility;Pharyngeal residue - valleculae;Pharyngeal residue - pyriform Pharyngeal -- Pharyngeal- Nectar Teaspoon -- Pharyngeal -- Pharyngeal- Nectar Cup Reduced epiglottic inversion;Reduced anterior laryngeal mobility;Pharyngeal residue - valleculae;Pharyngeal residue - pyriform;Penetration/Aspiration during swallow Pharyngeal Material enters airway, remains ABOVE vocal cords then ejected out Pharyngeal- Nectar Straw Reduced epiglottic inversion;Reduced anterior laryngeal mobility;Pharyngeal residue - valleculae;Pharyngeal residue - pyriform;Penetration/Aspiration during swallow Pharyngeal Material enters airway, remains ABOVE vocal cords and not ejected out;Material enters airway, passes BELOW cords and not ejected out despite cough attempt by patient Pharyngeal- Thin Teaspoon -- Pharyngeal -- Pharyngeal- Thin Cup Reduced epiglottic inversion;Reduced anterior laryngeal mobility;Pharyngeal  residue - valleculae;Pharyngeal residue - pyriform;Penetration/Aspiration during swallow Pharyngeal Material enters airway, passes BELOW cords and not ejected out despite cough attempt by patient Pharyngeal- Thin Straw Reduced epiglottic inversion;Reduced anterior laryngeal mobility;Pharyngeal residue - valleculae;Pharyngeal residue - pyriform;Penetration/Aspiration during swallow Pharyngeal Material enters airway, passes BELOW cords and not ejected out despite cough attempt by patient Pharyngeal- Puree Reduced epiglottic inversion;Reduced anterior laryngeal mobility;Pharyngeal residue - valleculae;Pharyngeal residue - pyriform Pharyngeal -- Pharyngeal- Mechanical Soft -- Pharyngeal -- Pharyngeal- Regular -- Pharyngeal -- Pharyngeal- Multi-consistency --  Pharyngeal -- Pharyngeal- Pill -- Pharyngeal -- Pharyngeal Comment --  CHL IP CERVICAL ESOPHAGEAL PHASE 02/14/2021 Cervical Esophageal Phase WFL Pudding Teaspoon -- Pudding Cup -- Honey Teaspoon -- Honey Cup WFL Nectar Teaspoon -- Nectar Cup -- Nectar Straw -- Thin Teaspoon -- Thin Cup -- Thin Straw -- Puree -- Mechanical Soft -- Regular -- Multi-consistency -- Pill -- Cervical Esophageal Comment -- Scheryl Marten 02/14/2021, 3:47 PM              Recent Labs    02/15/21 0413 02/16/21 0452  WBC 3.3* 2.8*  HGB 11.4* 11.8*  HCT 35.5* 35.7*  PLT 244 190   Recent Labs    02/15/21 0413 02/16/21 0452  NA 134* 136  K 4.5 4.1  CL 102 104  CO2 26 24  GLUCOSE 108* 105*  BUN 15 15  CREATININE 0.82 0.83  CALCIUM 8.3* 8.5*    Intake/Output Summary (Last 24 hours) at 02/16/2021 0929 Last data filed at 02/15/2021 1800 Gross per 24 hour  Intake 100 ml  Output --  Net 100 ml        Physical Exam: Vital Signs Blood pressure 117/78, pulse 94, temperature 98 F (36.7 C), resp. rate 17, height 5\' 10"  (1.778 m), weight 75.3 kg, SpO2 97 %.   General: No acute distress Mood and affect are appropriate Heart: Regular rate and rhythm no rubs murmurs  or extra sounds Lungs: Clear to auscultation, breathing unlabored, no rales or wheezes Abdomen: Positive bowel sounds, soft nontender to palpation, nondistended Extremities: No clubbing, cyanosis, or edema Skin: No evidence of breakdown, no evidence of rash Neurologic: Cranial nerves II through XII intact, motor strength is 5/5 in RIght  deltoid, bicep, tricep, grip, hip flexor, knee extensors, ankle dorsiflexor and plantar flexor 2- LUE and 4/5 LLE Sensory exam normal sensation to light touch on RIght and reduced on left Cerebellar exam  Unable to perform LUE due to weakness Musculoskeletal: Full range of motion in all 4 extremities. No joint swelling   Assessment/Plan: 1. Functional deficits which require 3+ hours per day of interdisciplinary therapy in a comprehensive inpatient rehab setting.  Physiatrist is providing close team supervision and 24 hour management of active medical problems listed below.  Physiatrist and rehab team continue to assess barriers to discharge/monitor patient progress toward functional and medical goals  Care Tool:  Bathing    Body parts bathed by patient: Face,Buttocks   Body parts bathed by helper: Right arm,Left arm,Chest,Abdomen,Right upper leg,Left upper leg,Right lower leg,Left lower leg     Bathing assist Assist Level: Total Assistance - Patient < 25%     Upper Body Dressing/Undressing Upper body dressing   What is the patient wearing?: Pull over shirt    Upper body assist Assist Level: Maximal Assistance - Patient 25 - 49%    Lower Body Dressing/Undressing Lower body dressing      What is the patient wearing?: Incontinence brief,Pants     Lower body assist Assist for lower body dressing: Maximal Assistance - Patient 25 - 49%     Toileting Toileting    Toileting assist Assist for toileting: Maximal Assistance - Patient 25 - 49%     Transfers Chair/bed transfer  Transfers assist     Chair/bed transfer assist level:  Moderate Assistance - Patient 50 - 74%     Locomotion Ambulation   Ambulation assist              Walk 10 feet activity   Assist  Walk 50 feet activity   Assist           Walk 150 feet activity   Assist           Walk 10 feet on uneven surface  activity   Assist           Wheelchair     Assist               Wheelchair 50 feet with 2 turns activity    Assist            Wheelchair 150 feet activity     Assist          Blood pressure 117/78, pulse 94, temperature 98 F (36.7 C), resp. rate 17, height 5\' 10"  (1.778 m), weight 75.3 kg, SpO2 97 %.  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 Recommendations of 30-day event monitor as outpatient. -patient [place] shower -ELOS/Goals: 18-21 Days- supervision to min A CIR PT, OT, SLP evals 2. Antithrombotics: -DVT/anticoagulation:Subcutaneous heparin -antiplatelet therapy: Aspirin 81 mg daily and Brilinta 60 mg twice daily 3. Pain Management:Tylenol as needed 4. Mood:Provide emotional support -antipsychotic agents: N/A 5. Neuropsych: This patientiscapable of making decisions on hisown behalf. 6. Skin/Wound Care:Routine skin checks 7. Fluids/Electrolytes/Nutrition:Routine in and outs with follow-up chemistries 8. Hypertension?. Currently on no antihypertensive medications.. Monitor with increased mobility Vitals:   02/15/21 1919 02/16/21 0504  BP: 125/73 117/78  Pulse: (!) 110 94  Resp: 18 17  Temp: 97.8 F (36.6 C) 98 F (36.7 C)  SpO2: 97% 97%   9. Dysphagia. Cortrak out-Dysphagia #2 nectar thick liquids. Follow-up speech therapy 10. Hyperlipidemia. Lipitor 11. Urinary retention. Discontinue Foley tube. Flomax 0.4 mg daily.  Check PVR- 12. Acute hypoxic respiratory failure due to COVID-19. Patient did receive steroids/remdesivir/Actermaand remained on isolation through 02/14/2021 and discontinued per infectious disease. Was not vaccinated 13. Constipation>- results since rehab admission    LOS: 1 days A FACE TO FACE EVALUATION WAS PERFORMED  Leonard [name] 02/16/2021, 9:29 AM

## 2021-02-17 NOTE — Progress Notes (Signed)
Occupational Therapy Session Note  Patient Details  Name: Leonard [name] MRN: [id number] Date of Birth: [birth year]  Today's Date: 02/17/2021 OT Individual Time: 8016-5537 OT Individual Time Calculation (min): 40 min    Short Term Goals: Week 1:  OT Short Term Goal 1 (Week 1): Pt will don shirt via hemi-technique with min A. OT Short Term Goal 2 (Week 1): Pt will don pants with mod A via hemi-technique. OT Short Term Goal 3 (Week 1): Pt will complete grooming task with min A in standing.  Skilled Therapeutic Interventions/Progress Updates:    Pt received in recliner, declining ADL and denies pain, agreeable to therapy. Session focus on LUE NMR and L inattention in prep for improved ADL performance. Stand-pivot to recliner with min A to scoot back in chair and CGA during transfer. W/c transport to and from gym 2/2 time management. Stood at [employer] to complete single target game, req min VCs to scan L to locate targets. Req mod A to support LUE to hit targets, as well. CGA for balance throughout. Seated, additionally completed the following with 1 lb dowel rod and ace wrap to facilitate L grip: 1x10 forward/backward row, B shoulder flexion, chest press. Req mod A with fatigue to support LUE to complete ROM. Finally, stand-pivot back to bed CGA. Discussed pt's current performance and OT POC with wife who was present.   Pt left semi-reclined in bed with wife/NT present, bed alarm engaged, call bell in reach, and all immediate needs met.    Therapy Documentation Precautions:  Precautions Precautions: Fall Precaution Comments: L neglect, LUE hemi, cognitive deficits, nectar thick/dys 2 diet, HOB > 30 degrees Restrictions Weight Bearing Restrictions: No LUE Weight Bearing: Partial weight bearing Pain:   denies ADL: See Care Tool for more details.   Therapy/Group: Individual Therapy  Volanda Napoleon MS, OTR/L  02/17/2021, 6:47 AM

## 2021-02-17 NOTE — Plan of Care (Signed)
  Problem: Consults Goal: RH STROKE PATIENT EDUCATION Description: See Patient Education module for education specifics  Outcome: Progressing Goal: Nutrition Consult-if indicated Outcome: Progressing   Problem: RH BOWEL ELIMINATION Goal: RH STG MANAGE BOWEL WITH ASSISTANCE Description: STG Manage Bowel with min Assistance. Outcome: Progressing Goal: RH STG MANAGE BOWEL W/MEDICATION W/ASSISTANCE Description: STG Manage Bowel with Medication with min Assistance. Outcome: Progressing   Problem: RH BLADDER ELIMINATION Goal: RH STG MANAGE BLADDER WITH ASSISTANCE Description: STG Manage Bladder With min Assistance Outcome: Progressing Goal: RH STG MANAGE BLADDER WITH MEDICATION WITH ASSISTANCE Description: STG Manage Bladder With Medication With min Assistance. Outcome: Progressing   Problem: RH SKIN INTEGRITY Goal: RH STG MAINTAIN SKIN INTEGRITY WITH ASSISTANCE Description: STG Maintain Skin Integrity With min Assistance. Outcome: Progressing Goal: RH STG ABLE TO PERFORM INCISION/WOUND CARE W/ASSISTANCE Description: STG Able To Perform skin Care With min Assistance. Outcome: Progressing   Problem: RH SAFETY Goal: RH STG ADHERE TO SAFETY PRECAUTIONS W/ASSISTANCE/DEVICE Description: STG Adhere to Safety Precautions With min Assistance/Device. Outcome: Progressing Goal: RH STG DECREASED RISK OF FALL WITH ASSISTANCE Description: STG Decreased Risk of Fall With cues and reminders. Outcome: Progressing   Problem: RH COGNITION-NURSING Goal: RH STG ANTICIPATES NEEDS/CALLS FOR ASSIST W/ASSIST/CUES Description: STG Anticipates Needs/Calls for Assist With Cues and reminders. Outcome: Progressing   Problem: RH PAIN MANAGEMENT Goal: RH STG PAIN MANAGED AT OR BELOW PT'S PAIN GOAL Description: <3 on a 0-10 pain scale. Outcome: Progressing   Problem: RH KNOWLEDGE DEFICIT Goal: RH STG INCREASE KNOWLEDGE OF DYSPHAGIA/FLUID INTAKE Description: Patient will be able to demonstrate safe  swallowing techniques, proper thickening techniques to the correct consistency with educational materials, handouts, and demonstration provided by staff, at discharge independently. Outcome: Progressing Goal: RH STG INCREASE KNOWLEGDE OF HYPERLIPIDEMIA Description: Patient will be able to demonstrate knowledge of HLD medications, proper diet, lab values with educational materials and handouts provided by staff, at discharge independently. Outcome: Progressing Goal: RH STG INCREASE KNOWLEDGE OF STROKE PROPHYLAXIS Description: Patient will be able to demonstrate knowledge of medications used to prevent future strokes with educational materials and handouts provided by staff, at discharge independently. Outcome: Progressing   

## 2021-02-17 NOTE — Progress Notes (Signed)
Physical Therapy Session Note  Patient Details  Name: Leonard [name] MRN: [id number] Date of Birth: Elwood 18, 1957  Today's Date: 02/17/2021 PT Individual Time: 1545-1630 PT Individual Time Calculation (min): 45 min   Short Term Goals: Week 1:  PT Short Term Goal 1 (Week 1): Pt will performed bed mobiltiy with superivsion assist PT Short Term Goal 2 (Week 1): Pt will transfer to [facility] with CGA and LRAD PT Short Term Goal 3 (Week 1): Pt will ambulate 113f with CGA and LRAD PT Short Term Goal 4 (Week 1): Pt will ascend 4 steps with 1 rail and min assist PT Short Term Goal 5 (Week 1): pt will improve 5xSTS to 27 sec to demonstrate decreased fall risk  Skilled Therapeutic Interventions/Progress Updates:   Pt received supine in bed and agreeable to PT. Supine>sit transfer with min assist. Pt performed gait training through unit in hall x [id number] no AD and min assist for safety and management of the LUE. Cues for awareness ofr obstacles on the L and improved step length height on the L. Dynamic gait training in parallel bars forward/reverse 8ft 39f, side stepping R and L with BUE support on rails with min assist to prevent compensations.    Pt performed dynamic balance training with min assist on airex pad to performed cross body reaches to place clothes p[ins on horizontal rail 2 x 15 with cues for visual scanning.   Bell cancelation test at bits in sitting. 5 minutes to complete with 12 misses in the upper L quadrant.  Pt returned to room and performed ambulatory transfer to bed with min assist. Sit>supine completed with supervision assist, and left supine in bed with call bell in reach and all needs met.   Pt distractable throughout session internally and externally       Therapy Documentation Precautions:  Precautions Precautions: Fall Precaution Comments: L neglect, LUE hemi, cognitive deficits, nectar thick/dys 2 diet, HOB > 30 degrees Restrictions Weight Bearing  Restrictions: No LUE Weight Bearing: Partial weight bearing    Pain: denies   Therapy/Group: Individual Therapy  AustiLorie Phenix/2022, 5:37 PM

## 2021-02-17 NOTE — Progress Notes (Signed)
Physical Therapy Session Note  Patient Details  Name: Leonard [name] MRN: [id number] Date of Birth: Apr 18, 1956  Today's Date: 02/17/2021 PT Individual Time: 4166-0630 PT Individual Time Calculation (min): 41 min   Short Term Goals: Week 1:  PT Short Term Goal 1 (Week 1): Pt will performed bed mobiltiy with superivsion assist PT Short Term Goal 2 (Week 1): Pt will transfer to [facility] with CGA and LRAD PT Short Term Goal 3 (Week 1): Pt will ambulate 171ft with CGA and LRAD PT Short Term Goal 4 (Week 1): Pt will ascend 4 steps with 1 rail and min assist PT Short Term Goal 5 (Week 1): pt will improve 5xSTS to 27 sec to demonstrate decreased fall risk  Skilled Therapeutic Interventions/Progress Updates:  Session 1:   Patient supine in bed on entrance to room. Patient alert and agreeable to PT session. Patient denied pain during session. Is emotionally labile throughout session with reminders to mother's recent passing (sight of RW, talking re: caregivers).   Therapeutic Activity: Bed Mobility: Patient performed supine --> sit with Mod A for bringing UB to upright seated position and scooting forward.  VC/ tc required for leaning forward to promote efficient forward scoot. Return to supine at end of session with CGA to reach supine and Min A for positioning toward HOB.  Transfers: Patient performed STS and SPVT transfers Min A from lower height bed to no AD and to RW. Pt demos good technique with push from seated surface.  Provided verbal cues for reaching back to seat prior to descent to sit.   Toilet transfer performed with Min A for controlled descent and rise to stand from lower, standard height toilet. Mod A for doffing/ donning pants with assist provided to L side. Mod A for pericare.   Gait Training:  Patient ambulated short household distances within room with no AD and CGA/ Min A for balance.  Demonstrated slow initiation of LLE, decreased step height/ length, decreased attn to task. Provided  vc/ tc for increasing step height,   Patient supine in bed at end of session with brakes locked, bed alarm set, and all needs within reach.  Session 2:  Patient supine in bed on entrance to room. Patient alert and agreeable to PT session. Patient denied pain during session.  Therapeutic Activity: Bed Mobility: Patient performed supine --> sit with Mod A. Pt falls back to supine with attempt to build momentum for scoot forward.  Introduced pt to log rolling technique. VC/ tc required for rolling to side, then use of R elbow to push up to seated position. Transfers: Patient performed STS and SPVT transfers to no AD with CGA throughout session. Provided verbal cues for scooting forward prior to attempting to stand as well as to reach back to w/ c armrest to control descent to sit..  Gait Training:  Patient ambulated 115' x2/ 25' x3 with no AD and CGA/ Min A for balance/ safety with w/c follow. Demonstrated easy distractibility causing pt to pause in ambulation, decreased step height/ length, downward gaze. Provided vc/ tc for correction of all and pt unable to self correct.   Guided in stair negotiation training with pt able to complete 2 bouts of four 6-in steps. Uses LUE on HR for support. Min A throughout ascent with vc/ tc for ascent leading with RLE and step-to pattern on LLE. Requires Min A and up to int Mod A for balance with descent and vc/ tc for L foot clearance, lowering with RLE, and  appropriate hand progression on rail.   Guided in stepping over small obstacles on floor and leading with LLE for improved step height with LLE.   Patient seated upright in recliner with BLE elevated at end of session. Brakes locked, belt alarm set, and all needs within reach.     Therapy Documentation Precautions:  Precautions Precautions: Fall Precaution Comments: L neglect, LUE hemi, cognitive deficits, nectar thick/dys 2 diet, HOB > 30 degrees Restrictions Weight Bearing Restrictions: No LUE  Weight Bearing: Partial weight bearing  Therapy/Group: Individual Therapy  Loel Dubonnet PT, DPT 02/17/2021, 12:19 PM

## 2021-02-18 DIAGNOSIS — I63511 Cerebral infarction due to unspecified occlusion or stenosis of right middle cerebral artery: Secondary | ICD-10-CM | POA: Diagnosis not present

## 2021-02-18 NOTE — Progress Notes (Signed)
PHARMACY CONSULT NOTE  Pharmacy Consult for Evaluation of Medication Induced Leukopenia  No Known Allergies  Labs: Recent Labs    02/16/21 0452  WBC 2.8*  HGB 11.8*  HCT 35.7*  PLT 190  CREATININE 0.83  ALBUMIN 2.9*  PROT 5.3*  AST 75*  ALT 129*  ALKPHOS 79  BILITOT 1.4*   Estimated Creatinine Clearance: 91.6 mL/min (by C-G formula based on SCr of 0.83 mg/dL).  Medications:  Scheduled:  . vitamin C  500 mg Oral Daily  . aspirin  81 mg Oral Daily   Or  . aspirin  81 mg Per Tube Daily  . atorvastatin  80 mg Oral Daily  . heparin  5,000 Units Subcutaneous Q8H  . pantoprazole  40 mg Oral Daily  . senna-docusate  1 tablet Oral Daily  . ticagrelor  90 mg Oral BID   Or  . ticagrelor  90 mg Per Tube BID  . zinc sulfate  220 mg Oral Daily    Unasyn 4/28 >>4/30, 5/1 >> 5/3  Remdesivir 4/30>>5/4  Cefepime 4/30>>5/1  Actemra 4/30  Solu-medrol 4/30 > 5/2  Pred 5/3 > 5/8  Background: 64 yoM admitted to rehab on 5/12 for recovery from acute right MCA infarct from 01/24/21. Started on aspirin and Brilinta for CVA secondary prevention. Also had COVID-19 PNA with hypoxia, s/p steroids/remdesivir/Actemra and broad spectrum antibiotics.   WBC on admit 4/20 was 9.1, started Unasyn for aspiration PNA 4/28 (WBC 15.5), then Cefepime 4/30 for worsening PNA (WBC 13). Then WBC dropped 5/1-5/3 to < 6, recovered slightly then dropped again 5/10. Note timing of steroids (see above) that likely impacted changes in WBC between 4/30 to 5/8.  Assessment: - Actemra risk of neutropenia reported when used for COVID-19 (frequency unknown but rare), per EUA recommend to monitor neutrophils and platelets per "current standard clinical practice". Typically monitored after 3-4 weeks when used chronically for other indications. - Cefepime possibly based on timing, although was on short course - Remdesivir case studies/reports of transient leukopenia, not common - Brillinta has no reports of  neutropenia/leukopenia. Clopidogrel does have reports of neutropenia due to bone marrow suppression, although rare would not recommend switching - Pantoprazole has < 2% reported rates of leukopenia in clinical trials (not a home med for this patient) - Unasyn has reports of neutropenia/leukopenia in clinical trials, frequency not defined - No other recent or current meds identified with risk of leukopenia  Conclusion: Would suspect Actemra and/or Cefepime to be most probable causes of drug-induced leukopenia. Do not suspect Brillinta is a cause, although theoretic risk given Plavix has reports of this.  Rare reports of leukopenia associated with PPIs, consider discontinuing pantoprazole (per chart review seems it was started in ICU for SUP, would recommend to stop if no indication for PPI now).  Laverna Peace, PharmD PGY-1 Pharmacy Resident 02/18/2021 9:53 AM Please see AMION for all pharmacy numbers

## 2021-02-18 NOTE — Plan of Care (Signed)
  Problem: Consults Goal: RH STROKE PATIENT EDUCATION Description: See Patient Education module for education specifics  Outcome: Progressing Goal: Nutrition Consult-if indicated Outcome: Progressing   Problem: RH BOWEL ELIMINATION Goal: RH STG MANAGE BOWEL WITH ASSISTANCE Description: STG Manage Bowel with min Assistance. Outcome: Progressing Goal: RH STG MANAGE BOWEL W/MEDICATION W/ASSISTANCE Description: STG Manage Bowel with Medication with min Assistance. Outcome: Progressing   Problem: RH BLADDER ELIMINATION Goal: RH STG MANAGE BLADDER WITH ASSISTANCE Description: STG Manage Bladder With min Assistance Outcome: Progressing Goal: RH STG MANAGE BLADDER WITH MEDICATION WITH ASSISTANCE Description: STG Manage Bladder With Medication With min Assistance. Outcome: Progressing   Problem: RH SKIN INTEGRITY Goal: RH STG MAINTAIN SKIN INTEGRITY WITH ASSISTANCE Description: STG Maintain Skin Integrity With min Assistance. Outcome: Progressing Goal: RH STG ABLE TO PERFORM INCISION/WOUND CARE W/ASSISTANCE Description: STG Able To Perform skin Care With min Assistance. Outcome: Progressing   Problem: RH SAFETY Goal: RH STG ADHERE TO SAFETY PRECAUTIONS W/ASSISTANCE/DEVICE Description: STG Adhere to Safety Precautions With min Assistance/Device. Outcome: Progressing Goal: RH STG DECREASED RISK OF FALL WITH ASSISTANCE Description: STG Decreased Risk of Fall With cues and reminders. Outcome: Progressing   Problem: RH COGNITION-NURSING Goal: RH STG ANTICIPATES NEEDS/CALLS FOR ASSIST W/ASSIST/CUES Description: STG Anticipates Needs/Calls for Assist With Cues and reminders. Outcome: Progressing   Problem: RH PAIN MANAGEMENT Goal: RH STG PAIN MANAGED AT OR BELOW PT'S PAIN GOAL Description: <3 on a 0-10 pain scale. Outcome: Progressing   Problem: RH KNOWLEDGE DEFICIT Goal: RH STG INCREASE KNOWLEDGE OF DYSPHAGIA/FLUID INTAKE Description: Patient will be able to demonstrate safe  swallowing techniques, proper thickening techniques to the correct consistency with educational materials, handouts, and demonstration provided by staff, at discharge independently. Outcome: Progressing Goal: RH STG INCREASE KNOWLEGDE OF HYPERLIPIDEMIA Description: Patient will be able to demonstrate knowledge of HLD medications, proper diet, lab values with educational materials and handouts provided by staff, at discharge independently. Outcome: Progressing Goal: RH STG INCREASE KNOWLEDGE OF STROKE PROPHYLAXIS Description: Patient will be able to demonstrate knowledge of medications used to prevent future strokes with educational materials and handouts provided by staff, at discharge independently. Outcome: Progressing

## 2021-02-18 NOTE — Progress Notes (Signed)
PROGRESS NOTE   Subjective/Complaints:  Discussed low WBC, mild anemia, no hx of same  Treated for Covid and bacterial PNA during recent acute care hospitalization   ROS- neg CP, SOB, N/V/D  Objective:   No results found. Recent Labs    02/16/21 0452  WBC 2.8*  HGB 11.8*  HCT 35.7*  PLT 190   Recent Labs    02/16/21 0452  NA 136  K 4.1  CL 104  CO2 24  GLUCOSE 105*  BUN 15  CREATININE 0.83  CALCIUM 8.5*    Intake/Output Summary (Last 24 hours) at 02/18/2021 0845 Last data filed at 02/18/2021 0400 Gross per 24 hour  Intake 150 ml  Output --  Net 150 ml        Physical Exam: Vital Signs Blood pressure (!) 142/77, pulse 83, temperature 97.7 F (36.5 C), resp. rate 14, height 5\' 10"  (1.778 m), weight 75.3 kg, SpO2 98 %.   General: No acute distress Mood and affect are appropriate Heart: Regular rate and rhythm no rubs murmurs or extra sounds Lungs: Clear to auscultation, breathing unlabored, no rales or wheezes Abdomen: Positive bowel sounds, soft nontender to palpation, nondistended Extremities: No clubbing, cyanosis, or edema Skin: No evidence of breakdown, no evidence of rash Neurologic: Cranial nerves II through XII intact, motor strength is 5/5 in RIght  deltoid, bicep, tricep, grip, hip flexor, knee extensors, ankle dorsiflexor and plantar flexor 2- LUE and 4/5 LLE Sensory exam normal sensation to light touch on RIght and reduced on left Cerebellar exam  Unable to perform LUE due to weakness Musculoskeletal: Full range of motion in all 4 extremities. No joint swelling   Assessment/Plan: 1. Functional deficits which require 3+ hours per day of interdisciplinary therapy in a comprehensive inpatient rehab setting.  Physiatrist is providing close team supervision and 24 hour management of active medical problems listed below.  Physiatrist and rehab team continue to assess barriers to  discharge/monitor patient progress toward functional and medical goals  Care Tool:  Bathing    Body parts bathed by patient: Face,Buttocks   Body parts bathed by helper: Right arm,Left arm,Chest,Abdomen,Right upper leg,Left upper leg,Right lower leg,Left lower leg     Bathing assist Assist Level: Total Assistance - Patient < 25%     Upper Body Dressing/Undressing Upper body dressing   What is the patient wearing?: Pull over shirt    Upper body assist Assist Level: Maximal Assistance - Patient 25 - 49%    Lower Body Dressing/Undressing Lower body dressing      What is the patient wearing?: Incontinence brief,Pants     Lower body assist Assist for lower body dressing: Maximal Assistance - Patient 25 - 49%     Toileting Toileting    Toileting assist Assist for toileting: Maximal Assistance - Patient 25 - 49%     Transfers Chair/bed transfer  Transfers assist     Chair/bed transfer assist level: Moderate Assistance - Patient 50 - 74%     Locomotion Ambulation   Ambulation assist      Assist level: Moderate Assistance - Patient 50 - 74% Assistive device: Hand held assist Max distance: 50   Walk 10 feet  activity   Assist     Assist level: Moderate Assistance - Patient - 50 - 74% Assistive device: Walker-rolling   Walk 50 feet activity   Assist    Assist level: Moderate Assistance - Patient - 50 - 74% Assistive device: Hand held assist    Walk 150 feet activity   Assist Walk 150 feet activity did not occur: Safety/medical concerns         Walk 10 feet on uneven surface  activity   Assist Walk 10 feet on uneven surfaces activity did not occur: Safety/medical concerns         Wheelchair     Assist Will patient use wheelchair at discharge?: No Type of Wheelchair: Manual    Wheelchair assist level: Total Assistance - Patient < 25% Max wheelchair distance: 150    Wheelchair 50 feet with 2 turns activity    Assist         Assist Level: Total Assistance - Patient < 25%   Wheelchair 150 feet activity     Assist      Assist Level: Total Assistance - Patient < 25%   Blood pressure (!) 142/77, pulse 83, temperature 97.7 F (36.5 C), resp. rate 14, height 5\' 10"  (1.778 m), weight 75.3 kg, SpO2 98 %.  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 Recommendations of 30-day event monitor as outpatient. -patient [place] shower -ELOS/Goals: 18-21 Days- supervision to min A CIR PT, OT, SLP evals 2. Antithrombotics: -DVT/anticoagulation:Subcutaneous heparin -antiplatelet therapy: Aspirin 81 mg daily and Brilinta 60 mg twice daily 3. Pain Management:Tylenol as needed 4. Mood:Provide emotional support -antipsychotic agents: N/A 5. Neuropsych: This patientiscapable of making decisions on hisown behalf. 6. Skin/Wound Care:Routine skin checks 7. Fluids/Electrolytes/Nutrition:Routine in and outs with follow-up chemistries 8. Hypertension?. Currently on no antihypertensive medications. good range at present  Vitals:   02/17/21 0424 02/18/21 0410  BP: 136/76 (!) 142/77  Pulse: 82 83  Resp: 14 14  Temp: 97.7 F (36.5 C) 97.7 F (36.5 C)  SpO2: 97% 98%   9. Dysphagia. Cortrak out-Dysphagia #2 nectar thick liquids. Follow-up speech therapy 10. Hyperlipidemia. Lipitor 11. Urinary retention. Discontinue Foley tube. Flomax 0.4 mg daily. Check PVR- 12. Acute hypoxic respiratory failure due to COVID-19. Patient did receive steroids/remdesivir/Actermaand remained on isolation through 02/14/2021 and discontinued per infectious disease. Was not vaccinated 13. Constipation>- results since rehab admission  14.  Neutropenia and anemia without fever - will ask for pharmacy consult ,  possibly Yoo be related to Brillinta, pt also received remdesivir and Actemra- recheck CBC in am   LOS: 3 days A FACE TO FACE EVALUATION WAS PERFORMED  04/16/2021 02/18/2021, 8:45 AM

## 2021-02-19 DIAGNOSIS — I63511 Cerebral infarction due to unspecified occlusion or stenosis of right middle cerebral artery: Secondary | ICD-10-CM | POA: Diagnosis not present

## 2021-02-19 NOTE — Plan of Care (Signed)
  Problem: Consults Goal: RH STROKE PATIENT EDUCATION Description: See Patient Education module for education specifics  Outcome: Progressing Goal: Nutrition Consult-if indicated Outcome: Progressing   Problem: RH BOWEL ELIMINATION Goal: RH STG MANAGE BOWEL WITH ASSISTANCE Description: STG Manage Bowel with min Assistance. Outcome: Progressing Goal: RH STG MANAGE BOWEL W/MEDICATION W/ASSISTANCE Description: STG Manage Bowel with Medication with min Assistance. Outcome: Progressing   Problem: RH BLADDER ELIMINATION Goal: RH STG MANAGE BLADDER WITH ASSISTANCE Description: STG Manage Bladder With min Assistance Outcome: Progressing Goal: RH STG MANAGE BLADDER WITH MEDICATION WITH ASSISTANCE Description: STG Manage Bladder With Medication With min Assistance. Outcome: Progressing   Problem: RH SKIN INTEGRITY Goal: RH STG MAINTAIN SKIN INTEGRITY WITH ASSISTANCE Description: STG Maintain Skin Integrity With min Assistance. Outcome: Progressing Goal: RH STG ABLE TO PERFORM INCISION/WOUND CARE W/ASSISTANCE Description: STG Able To Perform skin Care With min Assistance. Outcome: Progressing   Problem: RH SAFETY Goal: RH STG ADHERE TO SAFETY PRECAUTIONS W/ASSISTANCE/DEVICE Description: STG Adhere to Safety Precautions With min Assistance/Device. Outcome: Progressing Goal: RH STG DECREASED RISK OF FALL WITH ASSISTANCE Description: STG Decreased Risk of Fall With cues and reminders. Outcome: Progressing   Problem: RH COGNITION-NURSING Goal: RH STG ANTICIPATES NEEDS/CALLS FOR ASSIST W/ASSIST/CUES Description: STG Anticipates Needs/Calls for Assist With Cues and reminders. Outcome: Progressing   Problem: RH PAIN MANAGEMENT Goal: RH STG PAIN MANAGED AT OR BELOW PT'S PAIN GOAL Description: <3 on a 0-10 pain scale. Outcome: Progressing   Problem: RH KNOWLEDGE DEFICIT Goal: RH STG INCREASE KNOWLEDGE OF DYSPHAGIA/FLUID INTAKE Description: Patient will be able to demonstrate safe  swallowing techniques, proper thickening techniques to the correct consistency with educational materials, handouts, and demonstration provided by staff, at discharge independently. Outcome: Progressing Goal: RH STG INCREASE KNOWLEGDE OF HYPERLIPIDEMIA Description: Patient will be able to demonstrate knowledge of HLD medications, proper diet, lab values with educational materials and handouts provided by staff, at discharge independently. Outcome: Progressing Goal: RH STG INCREASE KNOWLEDGE OF STROKE PROPHYLAXIS Description: Patient will be able to demonstrate knowledge of medications used to prevent future strokes with educational materials and handouts provided by staff, at discharge independently. Outcome: Progressing   

## 2021-02-19 NOTE — Progress Notes (Signed)
Physical Therapy Session Note  Patient Details  Name: Leonard [name] MRN: [id number] Date of Birth: 1956/05/30  Today's Date: 02/19/2021 PT Individual Time: 0800-0900 and 1101-1130 PT Individual Time Calculation (min): 60 min and 29 min  Short Term Goals: Week 1:  PT Short Term Goal 1 (Week 1): Pt will performed bed mobiltiy with superivsion assist PT Short Term Goal 2 (Week 1): Pt will transfer to [facility] with CGA and LRAD PT Short Term Goal 3 (Week 1): Pt will ambulate 173ft with CGA and LRAD PT Short Term Goal 4 (Week 1): Pt will ascend 4 steps with 1 rail and min assist PT Short Term Goal 5 (Week 1): pt will improve 5xSTS to 27 sec to demonstrate decreased fall risk Week 2:     Skilled Therapeutic Interventions/Progress Updates:  Session 1: Patient supine in bed on entrance to room with NT present as pt is finishing with breakfast. Not much of plate is cleared yet pt states that he is through.  Patient alert and agreeable to PT session. Patient denied pain during session. Relates desire to toilet.   Therapeutic Activity: Bed Mobility: Patient performed supine --> sit with Min A to bring UB to upright seated position and this PT providing support to neglected LUE. VC/ tc required for attn to L arm and push from bed surface with RUE. Requires Max A to don TED hose and loses balance posteriorly when picking up each LE to present leg to this therapist to don despite attempt to don while pt supine. Pt is able to maintain balance while sitting OEB and bring each LE up to cross ankle over knee in order to doff grip sock prior to donning TED hose. Transfers: Patient performed initial STS from EOB to RW with ambulatory transfer to toilet with Min A overall d/t pt stated stiffness/ weakness at start of day. Requires vc/ tc for positioning and min sequencing/ technique. Toilets with intermittent supervision and then requires Max A for pericare, donning brief. Mod A for threading feet into shorts and  completing donning especially to L side, and Max A for correct sequencing and attention for threading LUE into armhole of t-shirt, then correcting RUE from neckhole into armhole. Min A to complete overhead and on. Min/ Mod A to place shoes and hold backs of shoes to complete donning of slip-on shoes.  STS and SPVT transfers throughout rest of session with focus on safe technique with hand placement and progression, as well as to decrease impulsive turn to sit to R with discard of walker to L side. Use of strapping in L hand to AE of RW has increased awareness somewhat.   Gait Training:  Patient ambulated 14' x2/ 110' x2 using RW with LUE AE and close supervision.  Provided vc/ tc for increasing LLE step length/ height, increasing stance time on RLE, and push with LUE.  Neuromuscular Re-ed: NMR facilitated during session with focus on standing balance. Pt guided in stepping onto foam wedge with HHA and finding as well as maintaining balance for total of prior to pt stating fatigue. He demos difficulty initially and require Min A to attain with consistent leans to L and posteriorly. With vc and then tactile cues with balance shifts toward edges of BOS, pt is able to make corrections within BOS to maintain with CGA for final minute.  Change to Airex pad with RW available when needed and pt able to self correct with CGA and vc with LUE to RW and RUE  hovering until needed.  NMR performed for improvements in motor control and coordination, balance, sequencing, judgement, and self confidence/ efficacy in performing all aspects of mobility at highest level of independence.   Patient supine in bed at end of session with brakes locked, bed alarm set, L hand splint in place and porpped onpillow and all needs within reach.  Session2: Patient supine in bed on entrance to room. Patient alert and agreeable to PT session. Patient denied pain throughout session. Did not notice any new water provided to pt so  cold nectar thick OJ provided to pt prior to start of activity while seated EOB. Pt able to maintain unsupported seated position EOB with supervision. Uses washcloth between sips to wipe face and hand.   Therapeutic Activity: Bed Mobility: Patient performed supine <> sit with supervision. VC/ tc required for posterior LOB when picking up feet to don shoes. Able to rise to upright seated position with CGA.  Transfers: Patient performed STS and SPVT transfers to RW with AE for L hand throughout session to/ from various  Surfaces and heights. Requires vc/ tc for safe hand placement and progression throughout session as pt tends to leave walker to left when turning to sit impulsively to R side.   Gait Training:  Patient ambulated 175 feet using RW with L hand AE and close supervision with intermittent CGA for balance and w/c follow.  Provided vc/ tc for level gaze and maneuvering obstacles.  Neuromuscular Re-ed: NMR facilitated during session with focus on L inattention and standing balance. Pt guided in use of BITS for full screen scanning in scattered sequence task for finding alphabet size 7 blue letters against white background. Requires verbal and visual cue for letter finding 22% of task with half cues for sequence and half for scanning to far L side of screen. Completes 26 objects/ letters with 86.67% accuracy in 3sec with reaction time of 7.04 seconds. NMR performed for improvements in motor control and coordination, balance, sequencing, judgement, and self confidence/ efficacy in performing all aspects of mobility at highest level of independence.   Patient seated upright in recliner at end of session with brakes locked, belt alarm set, and all needs within reach.   Therapy Documentation Precautions:  Precautions Precautions: Fall Precaution Comments: L neglect, LUE hemi, cognitive deficits, nectar thick/dys 2 diet, HOB > 30 degrees Restrictions Weight Bearing Restrictions: No LUE  Weight Bearing: Partial weight bearing  Therapy/Group: Individual Therapy  Loel Dubonnet PT, DPT 02/19/2021, 8:00 AM

## 2021-02-19 NOTE — Progress Notes (Signed)
Occupational Therapy Session Note  Patient Details  Name: Jaydis Duchene Dissinger MRN: [id number] Date of Birth: [birth year]  Today's Date: 02/19/2021 OT Individual Time: 1432-1530 OT Individual Time Calculation (min): 58 min    Short Term Goals: Week 1:  OT Short Term Goal 1 (Week 1): Pt will don shirt via hemi-technique with min A. OT Short Term Goal 2 (Week 1): Pt will don pants with mod A via hemi-technique. OT Short Term Goal 3 (Week 1): Pt will complete grooming task with min A in standing.  Skilled Therapeutic Interventions/Progress Updates:    Pt in bed to start session, agreeable to working on shower and dressing during session.  Min assist for supine to sit on the left side of the bed, secondary to LUE weakness.  He was then assisted with doffing of his TEDs and donning of gripper socks.  He ambulated to the toilet with min hand held assist and completed all aspects of toileting with mod assist.  He then transferred over to the shower bench with min assist and removed his pullover shirt at the same min assist level as well as his gripper socks.  Bathing was completed at overall mod assist level.  He continues to neglect the left UE and LE with washing and rinsing, requiring mod instructional cueing to recall and initiate.  Mod assist was also needed for use of the LUE to wash the right arm and part of his abdomen.  Max assist was needed when trying to grip objects in the left hand to hold for opening them.  Min assist for standing as well while washing front and back peri area.  Once, he dried off he transferred back out to the EOB where he completed UB dressing with mod assist following hemi techniques and LB dressing at mod assist sit to stand for donning scrub pants.  Therapist donned his TEDS per his request and then he transferred back to supine to rest with min assist.  Call button and phone in reach with safety alarm in place.    Therapy Documentation Precautions:  Precautions Precautions:  Fall Precaution Comments: L neglect, LUE hemi, cognitive deficits, nectar thick/dys 2 diet, HOB > 30 degrees Restrictions Weight Bearing Restrictions: No LUE Weight Bearing: Weight bearing as tolerated   Pain: Pain Assessment Pain Scale: Faces Pain Score: 0-No pain ADL: See Care Tool Section for some details of mobility and selfcare  Therapy/Group: Individual Therapy  Mariaha Ellington OTR/L 02/19/2021, 4:19 PM

## 2021-02-19 NOTE — Progress Notes (Signed)
Patient ID: Leonard [name] President, male   DOB: 11/10/1955, 65 y.o.   MRN: [id number] Met with the patient, spouse and son to review role of the nurse CM and initiate secondary stroke risk management education. Patient's wife notes this is all new, patient was not taking medications nor had no idea all that was at risk prior to event. Also contracted COVID 19 while hospitalized. Reviewed secondary risks including prediabetes, HLD (LDL 126; goal of 70) and HTN. DAPT for now; ASA/Brilinta. Working on constipation, foley out and voiding fine. Continue D2 Nectar diet. Given handouts on BP management, dietary modification and tips for correcting hyponatremia.Reminder to wear resting hand splint at HS. Continue to follow along to discharge to address educational needs. Collaborate with the SW to facilitate preparation for discharge. Margarito Liner

## 2021-02-19 NOTE — Progress Notes (Signed)
PROGRESS NOTE   Subjective/Complaints: No complaints this morning IV [place] be removed He is picking at his left cheek- appreciate Tomeka applying bandage Diastolic BP high  ROS- denies CP, SOB, N/V/D  Objective:   No results found. No results for input(s): WBC, HGB, HCT, PLT in the last 72 hours. No results for input(s): NA, K, CL, CO2, GLUCOSE, BUN, CREATININE, CALCIUM in the last 72 hours.  Intake/Output Summary (Last 24 hours) at 02/19/2021 1049 Last data filed at 02/19/2021 0900 Gross per 24 hour  Intake 220 ml  Output 350 ml  Net -130 ml        Physical Exam: Vital Signs Blood pressure (!) 131/92, pulse 79, temperature 98.2 F (36.8 C), resp. rate 16, height 5\' 10"  (1.778 m), weight 75.3 kg, SpO2 96 %. Gen: no distress, normal appearing HEENT: oral mucosa pink and moist, NCAT Cardio: Reg rate Chest: normal effort, normal rate of breathing Abd: soft, non-distended Ext: no edema Psych: pleasant, normal affect Skin: intact Neurologic: Cranial nerves II through XII intact, motor strength is 5/5 in RIght  deltoid, bicep, tricep, grip, hip flexor, knee extensors, ankle dorsiflexor and plantar flexor 2- LUE and 4/5 LLE Sensory exam normal sensation to light touch on RIght and reduced on left Cerebellar exam  Unable to perform LUE due to weakness Musculoskeletal: Full range of motion in all 4 extremities. No joint swelling   Assessment/Plan: 1. Functional deficits which require 3+ hours per day of interdisciplinary therapy in a comprehensive inpatient rehab setting.  Physiatrist is providing close team supervision and 24 hour management of active medical problems listed below.  Physiatrist and rehab team continue to assess barriers to discharge/monitor patient progress toward functional and medical goals  Care Tool:  Bathing    Body parts bathed by patient: Face,Buttocks   Body parts bathed by helper: Right  arm,Left arm,Chest,Abdomen,Right upper leg,Left upper leg,Right lower leg,Left lower leg     Bathing assist Assist Level: Total Assistance - Patient < 25%     Upper Body Dressing/Undressing Upper body dressing   What is the patient wearing?: Pull over shirt    Upper body assist Assist Level: Maximal Assistance - Patient 25 - 49%    Lower Body Dressing/Undressing Lower body dressing      What is the patient wearing?: Incontinence brief,Pants     Lower body assist Assist for lower body dressing: Maximal Assistance - Patient 25 - 49%     Toileting Toileting    Toileting assist Assist for toileting: Maximal Assistance - Patient 25 - 49%     Transfers Chair/bed transfer  Transfers assist     Chair/bed transfer assist level: Moderate Assistance - Patient 50 - 74%     Locomotion Ambulation   Ambulation assist      Assist level: Moderate Assistance - Patient 50 - 74% Assistive device: Hand held assist Max distance: 50   Walk 10 feet activity   Assist     Assist level: Moderate Assistance - Patient - 50 - 74% Assistive device: Walker-rolling   Walk 50 feet activity   Assist    Assist level: Moderate Assistance - Patient - 50 - 74% Assistive device: Hand  held assist    Walk 150 feet activity   Assist Walk 150 feet activity did not occur: Safety/medical concerns         Walk 10 feet on uneven surface  activity   Assist Walk 10 feet on uneven surfaces activity did not occur: Safety/medical concerns         Wheelchair     Assist Will patient use wheelchair at discharge?: No Type of Wheelchair: Manual    Wheelchair assist level: Total Assistance - Patient < 25% Max wheelchair distance: 150    Wheelchair 50 feet with 2 turns activity    Assist        Assist Level: Total Assistance - Patient < 25%   Wheelchair 150 feet activity     Assist      Assist Level: Total Assistance - Patient < 25%   Blood pressure (!)  131/92, pulse 79, temperature 98.2 F (36.8 C), resp. rate 16, height 5\' 10"  (1.778 m), weight 75.3 kg, SpO2 96 %.  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 Recommendations of 30-day event monitor as outpatient. -patient Marolf shower -ELOS/Goals: 18-21 Days- supervision to min A  Continue CIR PT, OT, SLP evals 2. Antithrombotics: -DVT/anticoagulation:Subcutaneous heparin -antiplatelet therapy: Aspirin 81 mg daily and Brilinta 60 mg twice daily 3. Pain Management:Tylenol as needed 4. Mood:Provide emotional support -antipsychotic agents: N/A 5. Neuropsych: This patientiscapable of making decisions on hisown behalf. 6. Skin/Wound Care:Routine skin checks. Discontinue IC 7. Fluids/Electrolytes/Nutrition:Routine in and outs with follow-up chemistries 8. Elevated diastolic BP: continue to monitor TID Vitals:   02/18/21 1939 02/19/21 0306  BP: 132/80 (!) 131/92  Pulse: 89 79  Resp: 18 16  Temp: (!) 97.5 F (36.4 C) 98.2 F (36.8 C)  SpO2: 98% 96%   9. Dysphagia. Cortrak out-Dysphagia #2 nectar thick liquids. Follow-up speech therapy 10. Hyperlipidemia. Lipitor 11. Urinary retention. Discontinue Foley tube. Continue Flomax 0.4 mg daily. Check PVR- 12. Acute hypoxic respiratory failure due to COVID-19. Patient did receive steroids/remdesivir/Actermaand remained on isolation through 02/14/2021 and discontinued per infectious disease. Was not vaccinated 13. Constipation>- results since rehab admission  14.  Neutropenia and anemia without fever - will ask for pharmacy consult , possibly Haran be related to Brillinta, pt also received remdesivir and Actemra- recheck CBC in am   5/16: appreciate pharm consult- they think actemra or Cefepime are most like the cause-  he is now off both.   LOS: 4 days A FACE TO FACE EVALUATION WAS PERFORMED  6/16 Tashe Purdon 02/19/2021, 10:49 AM

## 2021-02-19 NOTE — Progress Notes (Signed)
Speech Language Pathology Daily Session Note  Patient Details  Name: Leonard [name] MRN: [id number] Date of Birth: [birth year]  Today's Date: 02/19/2021 SLP Individual Time: 1345-1415 SLP Individual Time Calculation (min): 30 min  Short Term Goals: Week 1: SLP Short Term Goal 1 (Week 1): Pt will tolerate Dys 2/NTL diet with no overt s/s aspiration with min A cues for use of swallow strategies as trained SLP Short Term Goal 2 (Week 1): Pt will tolerate trials of Dys 3, ice chips and thin liquids with minimal s/s aspiration and Min A cues for use of swallow strategies SLP Short Term Goal 3 (Week 1): Pt will recall basic novel/functional information with mod A verbal cues for compensatory strategies SLP Short Term Goal 4 (Week 1): Pt will follow 2-3 step simple and abstract directions with mod A SLP Short Term Goal 5 (Week 1): Pt will utilize compensatory strategies to increase intelligibility to 90% at sentence level with min A  Skilled Therapeutic Interventions:Skilled ST services focused on swallow skills. SLP communicated with nurse about attempt for pt to consume medication whole in puree, however continued oral holding was noted. SLP provided education to pt the continued need to crush medication due to oral holding noted at bedside and on most recent MBS. SLP facilitated oral care prior to thin liquid trials. Pt consumed thin liquids via cup sips demonstrating delayed cough in 2 out 10 opportunities and immediate cough via straw in 1 out 3 opportunities. SLP recommends to continue thin and solid trials prior to a repeat MBS towards the end of this week. Pt was left in room with call bell within reach and bed alarm set. SLP recommends to continue skilled services.     Pain Pain Assessment Pain Scale: 0-10 Pain Score: 0-No pain  Therapy/Group: Individual Therapy  Rudy Luhmann  [facility] 02/19/2021, 7:53 AM

## 2021-02-20 NOTE — Progress Notes (Signed)
Speech Language Pathology Daily Session Note  Patient Details  Name: Crosley Stejskal Gaffin MRN: [id number] Date of Birth: 1956-05-06  Today's Date: 02/20/2021 SLP Individual Time: 4462-8638 SLP Individual Time Calculation (min): 59 min  Short Term Goals: Week 1: SLP Short Term Goal 1 (Week 1): Pt will tolerate Dys 2/NTL diet with no overt s/s aspiration with min A cues for use of swallow strategies as trained SLP Short Term Goal 2 (Week 1): Pt will tolerate trials of Dys 3, ice chips and thin liquids with minimal s/s aspiration and Min A cues for use of swallow strategies SLP Short Term Goal 3 (Week 1): Pt will recall basic novel/functional information with mod A verbal cues for compensatory strategies SLP Short Term Goal 4 (Week 1): Pt will follow 2-3 step simple and abstract directions with mod A SLP Short Term Goal 5 (Week 1): Pt will utilize compensatory strategies to increase intelligibility to 90% at sentence level with min A  Skilled Therapeutic Interventions:Skilled ST services focused on swallow and cognitive skills. SLP facilitated basic problem solving, recall within task and thought organization in ALFA money management. Pt required max A verbal cues appeared due to poor thought organization and recall deficits. SLP also facilitated basic problem in PEG design task to reduce reliance on recall, pt required mod A fade to min A verbal cues for problem solving and error awareness. Pt required set up assist for oral care, pt consumed 10 cup sips of thin liquid trials, demonstrated x2 delayed cough, x1 immediate cough and x1 wet vocal quality noted. Pt was left in room with call bell within reach and bed alarm set. SLP recommends to continue skilled services.     Pain Pain Assessment Pain Score: 0-No pain  Therapy/Group: Individual Therapy  Irina Okelly  [facility] 02/20/2021, 4:01 PM

## 2021-02-20 NOTE — Progress Notes (Signed)
Physical Therapy Session Note  Patient Details  Name: Leonard [name] MRN: [id number] Date of Birth: 12-13-1955  Today's Date: 02/20/2021 PT Individual Time: 0800-0858 PT Individual Time Calculation (min): 58 min   Short Term Goals: Week 1:  PT Short Term Goal 1 (Week 1): Pt will performed bed mobiltiy with superivsion assist PT Short Term Goal 2 (Week 1): Pt will transfer to [facility] with CGA and LRAD PT Short Term Goal 3 (Week 1): Pt will ambulate 17ft with CGA and LRAD PT Short Term Goal 4 (Week 1): Pt will ascend 4 steps with 1 rail and min assist PT Short Term Goal 5 (Week 1): pt will improve 5xSTS to 27 sec to demonstrate decreased fall risk  Skilled Therapeutic Interventions/Progress Updates:  Patient supine in bed on entrance to room. Patient alert and agreeable to PT session. Patient denied pain during session.  Therapeutic Activity: Bed Mobility: Patient performed supine <> sit with supervision to pt's L side in order to perform as per home setup. VC/ tc required for technique to push UB to seated position. Transfers: Patient performed STS and SPVT transfers throughout session with supervision/ CGA. Provided verbal cues for reaching back to armrest prior to descent to sit.  Gait Training:  Patient ambulated 150' x2 using RW with supervision. Demonstrated intermittent pause in gait d/t visual distraction toward R side in offices as well as with slow motor process of balance with slight shift outside of BOS. VC/ tc for level gaze and upright posture.   Neuromuscular Re-ed: NMR facilitated during session with focus on standing and seated balance and L inattention. Pt guided in dynamic standing balance with toe touches to 6' step with LUE support only. Requires Min A to maintain balance. Balance also challenged with card finding activity. Pt is able to locate all cards without cues except for 3 cards along far L edge of board. Sitting balance challenged with balance disk placed on ROHO  cushion and playing card board placed far outside of anterior BOS. Pt encouraged to lean forward in order to match 15 cards. Requires vc again to locate cards on far L edge of board initially and then is able to scan L to locate final 3 cards without cues. Sitting balance good with no LOB and demos good stepping strategies in order to complete reaches with LUE.  NMR performed for improvements in motor control and coordination, balance, sequencing, judgement, and self confidence/ efficacy in performing all aspects of mobility at highest level of independence.   Patient seated in recliner with BLE elevated at end of session with brakes locked, belt alarm set, and all needs within reach.   Therapy Documentation Precautions:  Precautions Precautions: Fall Precaution Comments: L neglect, LUE hemi, cognitive deficits, nectar thick/dys 2 diet, HOB > 30 degrees Restrictions Weight Bearing Restrictions: No LUE Weight Bearing: Weight bearing as tolerated  Therapy/Group: Individual Therapy  Loel Dubonnet PT, DPT 02/20/2021, 8:01 AM

## 2021-02-20 NOTE — Progress Notes (Signed)
Occupational Therapy Session Note  Patient Details  Name: Leonard [name] MRN: [id number] Date of Birth: August 30, 1956  Today's Date: 02/20/2021 OT Individual Time: 1103-0400 OT Individual Time Calculation (min): 55 min    Short Term Goals: Week 1:  OT Short Term Goal 1 (Week 1): Pt will don shirt via hemi-technique with min A. OT Short Term Goal 2 (Week 1): Pt will don pants with mod A via hemi-technique. OT Short Term Goal 3 (Week 1): Pt will complete grooming task with min A in standing.  Skilled Therapeutic Interventions/Progress Updates:    Pt received in recliner, denies pain, agreeable to therapy. STS and amb to toilet with CGA. Cont void of b/b. CGA for LB clothing management and posterior pericare in standing. Amb to and from gym with RW + L orthotic grip with CGA. Session focus on LUE NMR/WB in prep for improved functional return of LUE. Completed massed practice of shoulder flexion/triceps extension with use of UE ranger/therapist support to hit external target. Additionally, completed 1x15 scapular elevation, depression, lateral leans, and forward towel slides. Cont to demonstrate Brunstrum level II in LUE and I in hand. Amb transfer back to bed and returned to supine close S.  Pt left semi-reclined in bed with LUE supported, bed alarm engaged, call bell in reach, and all immediate needs met.    Therapy Documentation Precautions:  Precautions Precautions: Fall Precaution Comments: L neglect, LUE hemi, cognitive deficits, nectar thick/dys 2 diet, HOB > 30 degrees Restrictions Weight Bearing Restrictions: No LUE Weight Bearing: Weight bearing as tolerated Pain:   denies ADL: See Care Tool for more details.  Therapy/Group: Individual Therapy  Volanda Napoleon MS, OTR/L  02/20/2021, 6:40 AM

## 2021-02-21 DIAGNOSIS — I63511 Cerebral infarction due to unspecified occlusion or stenosis of right middle cerebral artery: Secondary | ICD-10-CM | POA: Diagnosis not present

## 2021-02-21 LAB — CBC
HCT: 38.4 % — ABNORMAL LOW (ref 39.0–52.0)
Hemoglobin: 12.2 g/dL — ABNORMAL LOW (ref 13.0–17.0)
MCH: 30.1 pg (ref 26.0–34.0)
MCHC: 31.8 g/dL (ref 30.0–36.0)
MCV: 94.8 fL (ref 80.0–100.0)
Platelets: 151 10*3/uL (ref 150–400)
RBC: 4.05 MIL/uL — ABNORMAL LOW (ref 4.22–5.81)
RDW: 15.9 % — ABNORMAL HIGH (ref 11.5–15.5)
WBC: 2.5 10*3/uL — ABNORMAL LOW (ref 4.0–10.5)
nRBC: 0 % (ref 0.0–0.2)

## 2021-02-21 MED ORDER — BIOTENE DRY MOUTH MT LIQD
15.0000 mL | OROMUCOSAL | Status: DC | PRN
  Administered 2021-02-27: 15 mL via OROMUCOSAL

## 2021-02-21 NOTE — Progress Notes (Signed)
Occupational Therapy Session Note  Patient Details  Name: Leonard [name] MRN: [id number] Date of Birth: 1955/12/02  Today's Date: 02/21/2021 OT Individual Time: 1188-6773 OT Individual Time Calculation (min): 70 min    Short Term Goals: Week 1:  OT Short Term Goal 1 (Week 1): Pt will don shirt via hemi-technique with min A. OT Short Term Goal 2 (Week 1): Pt will don pants with mod A via hemi-technique. OT Short Term Goal 3 (Week 1): Pt will complete grooming task with min A in standing.  Skilled Therapeutic Interventions/Progress Updates:    Pt received asleep in bed, easily awakened to voice, agreeable to therapy. Denies pain throughout session. Came to sitting EOB with min A to lift trunk and min VCs to manage LUE. Doffed shirt close S with min VCs to pull over head. Doffed brief/pants CGA for STS. Donned new shirt min A to thread LUE via hemi-technique, donned pants/brief with min A to thread LLE. Noted improved in initiation and attention to ADL tasks this date. Donned B teds total A, doffed B socks mod A to remove R sock, pt able to don B shoes close S. With focus on static sitting balance, LUE NMR, and L attention, pt sate EOB to eat breakfast. Pt able to locate items on L side of tray this date, req some A to open items and cuing to adhere to swallowing strategies per SLP sheet. Req total A at this time to incorporate LUE to stabilize items. STS with min A to power up and amb to and from sink CGA with RW with L orthotic grip. Stood to brush teeth and comb hair with good initiation/completion of task. Amb back to recliner CGA with mod VCs for RW safety. Beverages removed from tray as pt is full S.  Pt left seated in recliner with LUE supported, chair alarm engaged, call bell in reach, and all immediate needs met.    Therapy Documentation Precautions:  Precautions Precautions: Fall Precaution Comments: L neglect, LUE hemi, cognitive deficits, nectar thick/dys 2 diet, HOB > 30  degrees Restrictions Weight Bearing Restrictions: No LUE Weight Bearing: Weight bearing as tolerated Pain: denies   ADL: See Care Tool for more details.  Therapy/Group: Individual Therapy  Volanda Napoleon MS, OTR/L  02/21/2021, 6:47 AM

## 2021-02-21 NOTE — Patient Care Conference (Signed)
Inpatient RehabilitationTeam Conference and Plan of Care Update Date: 02/21/2021   Time: 10:04 AM    Patient Name: Leonard [name]      Medical Record Number: [id number]  Date of Birth: [birth year] Sex: Male         Room/Bed: 4M12C/4M12C-01 Payor Info: Payor: [employer] MEDICARE / Plan: [employer] MEDICARE HMO / Product Type: *No Product type* /    Admit Date/Time:  02/15/2021  1:59 PM  Primary Diagnosis:  Right middle cerebral artery stroke [facility])  Hospital Problems: Principal Problem:   Right middle cerebral artery stroke [facility])    Expected Discharge Date: Expected Discharge Date: 03/02/21  Team Members Present: Physician leading conference: Dr. Claudette Laws Care Coodinator Present: Chana Bode, RN, BSN, CRRN;Christina Vita Barley, BSW Nurse Present: Chana Bode, RN PT Present: Other (comment) Sharol Harness, PT) OT Present: Other (comment) Annye English, OT) SLP Present: Other (comment) Fae Pippin, SLP) PPS Coordinator present : Fae Pippin, SLP     Current Status/Progress Goal Weekly Team Focus  Bowel/Bladder   Incontinent of bowel and bladder. LBM 02/20/2021  Regain continent status  Toilet q2h.   Swallow/Nutrition/ Hydration   Nectar thick and dys 2 textures  Supervision A  trials of thin liquids, dys 3 textures, swallow strategies/exercises and plan for repeat MBS   ADL's   min A UBB, mod A UBD, LBB/LBD, CGA to min A for toileting/toilet and shower transfers, poor initiation but improving; Brunstrum level II for LUE, I hand  CGA transfers, min A UBD/LBD  LUE NMR, L inattention, functional cognition, balance/self-care/transfer retraining, pt/family/AE/DME education   Mobility   Bed mobility = CGA with intertmittent Min A for UB; Transfers = CGA; Gait = CGA using RW for longer distances and no AD for short distances  Bed mobility with  supervision, Transfers at Mod I/ supervision level; gait at supervision level using LRAD  improving sitting/ standing dynamic balance,  increasing attention to L side, general strengthening and increased activity tolerance, initiating stair negotiation training   Communication   min-supervision A conversation  Supervision A  speech intelligiblity strategies   Safety/Cognition/ Behavioral Observations  max-mod A  Supervision A  recall, basic problem solving, following commands and orientation   Pain   no c/o pain  pain <3/10  Asses Qshift and PRN   Skin   Skin intact. Patient picks at left side of face.  Maintain skin integrity.  Assess Qshift and PRN     Discharge Planning:  pt to d/c MOD I   Team Discussion: Rt. MCA w left neglect, hemiparesis and poor awareness of deficits. Initiation has improved and working on conversation, recall and orientation.  Patient on target to meet rehab goals: yes, currently min assist for upper body bathing and dressing, CGA for toileting and shower transfers. Supervision to min assist for conversation, working on recall , orientation. MBS end of the week or early next week.   *See Care Plan and progress notes for long and short-term goals.   Revisions to Treatment Plan:  CGA goals set and min assist for dressing upgraded to mod I level and supervision goals for SLP  Teaching Needs: Transfers, toileting, assistance for steps, medication management, secondary stroke risk management, etc.  Current Barriers to Discharge: Decreased caregiver support and Home enviroment access/layout  Possible Resolutions to Barriers: Family education     Medical Summary Current Status: R MCA infarct with left neglect , poor awareness of deficits  Barriers to Discharge: Medical stability   Possible Resolutions to [employer] Focus:  cont to monitor BP, work on awareness of deficits and safety   Continued Need for Acute Rehabilitation Level of Care: The patient requires daily medical management by a physician with specialized training in physical medicine and rehabilitation for the following  reasons: Direction of a multidisciplinary physical rehabilitation program to maximize functional independence : Yes Medical management of patient stability for increased activity during participation in an intensive rehabilitation regime.: Yes Analysis of laboratory values and/or radiology reports with any subsequent need for medication adjustment and/or medical intervention. : Yes   I attest that I was present, lead the team conference, and concur with the assessment and plan of the team.   Chana Bode B 02/21/2021, 1:11 PM

## 2021-02-21 NOTE — Progress Notes (Signed)
PROGRESS NOTE   Subjective/Complaints: C/o tongue soreness  Mouth feels dry  Labs reviewed  ROS- denies CP, SOB, N/V/D  Objective:   No results found. No results for input(s): WBC, HGB, HCT, PLT in the last 72 hours. No results for input(s): NA, K, CL, CO2, GLUCOSE, BUN, CREATININE, CALCIUM in the last 72 hours.  Intake/Output Summary (Last 24 hours) at 02/21/2021 0755 Last data filed at 02/20/2021 1837 Gross per 24 hour  Intake 700 ml  Output --  Net 700 ml        Physical Exam: Vital Signs Blood pressure 133/74, pulse 77, temperature 98 F (36.7 C), temperature source Oral, resp. rate 16, height 5\' 10"  (1.778 m), weight 75.3 kg, SpO2 94 %. ENT- tongue , oral mucosa dry  General: No acute distress Mood and affect are appropriate Heart: Regular rate and rhythm no rubs murmurs or extra sounds Lungs: Clear to auscultation, breathing unlabored, no rales or wheezes Abdomen: Positive bowel sounds, soft nontender to palpation, nondistended Extremities: No clubbing, cyanosis, or edema Skin: No evidence of breakdown, no evidence of rash   Neurologic: Cranial nerves II through XII intact, motor strength is 5/5 in RIght  deltoid, bicep, tricep, grip, hip flexor, knee extensors, ankle dorsiflexor and plantar flexor 2- LUE and 4/5 LLE Sensory exam normal sensation to light touch on RIght and reduced on left Cerebellar exam  Unable to perform LUE due to weakness Musculoskeletal: Full range of motion in all 4 extremities. No joint swelling   Assessment/Plan: 1. Functional deficits which require 3+ hours per day of interdisciplinary therapy in a comprehensive inpatient rehab setting.  Physiatrist is providing close team supervision and 24 hour management of active medical problems listed below.  Physiatrist and rehab team continue to assess barriers to discharge/monitor patient progress toward functional and medical  goals  Care Tool:  Bathing    Body parts bathed by patient: Chest,Abdomen,Front perineal area,Right upper leg,Left upper leg,Face,Left arm   Body parts bathed by helper: Right arm,Buttocks,Left lower leg,Right lower leg     Bathing assist Assist Level: Moderate Assistance - Patient 50 - 74%     Upper Body Dressing/Undressing Upper body dressing   What is the patient wearing?: Pull over shirt    Upper body assist Assist Level: Moderate Assistance - Patient 50 - 74%    Lower Body Dressing/Undressing Lower body dressing      What is the patient wearing?: Incontinence brief,Pants     Lower body assist Assist for lower body dressing: Moderate Assistance - Patient 50 - 74%     Toileting Toileting    Toileting assist Assist for toileting: Contact Guard/Touching assist     Transfers Chair/bed transfer  Transfers assist     Chair/bed transfer assist level: Contact Guard/Touching assist     Locomotion Ambulation   Ambulation assist      Assist level: Contact Guard/Touching assist Assistive device: Walker-rolling Max distance: 120 ft   Walk 10 feet activity   Assist     Assist level: Contact Guard/Touching assist Assistive device: Walker-rolling   Walk 50 feet activity   Assist    Assist level: Contact Guard/Touching assist Assistive device: Walker-rolling  Walk 150 feet activity   Assist Walk 150 feet activity did not occur: Safety/medical concerns         Walk 10 feet on uneven surface  activity   Assist Walk 10 feet on uneven surfaces activity did not occur: Safety/medical concerns         Wheelchair     Assist Will patient use wheelchair at discharge?: No Type of Wheelchair: Manual    Wheelchair assist level: Total Assistance - Patient < 25% Max wheelchair distance: 150    Wheelchair 50 feet with 2 turns activity    Assist        Assist Level: Total Assistance - Patient < 25%   Wheelchair 150 feet  activity     Assist      Assist Level: Total Assistance - Patient < 25%   Blood pressure 133/74, pulse 77, temperature 98 F (36.7 C), temperature source Oral, resp. rate 16, height 5\' 10"  (1.778 m), weight 75.3 kg, SpO2 94 %.  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 Recommendations of 30-day event monitor as outpatient. -patient Gadea shower -ELOS/Goals: 18-21 Days- supervision to min A  Continue CIR PT, OT, SLP evals 2. Antithrombotics: -DVT/anticoagulation:Subcutaneous heparin -antiplatelet therapy: Aspirin 81 mg daily and Brilinta 60 mg twice daily 3. Pain Management:Tylenol as needed 4. Mood:Provide emotional support -antipsychotic agents: N/A 5. Neuropsych: This patientiscapable of making decisions on hisown behalf. 6. Skin/Wound Care:Routine skin checks. Discontinue IC 7. Fluids/Electrolytes/Nutrition:Routine in and outs with follow-up chemistries 8. Elevated diastolic BP: continue to monitor TID Vitals:   02/20/21 1908 02/21/21 0419  BP: 128/67 133/74  Pulse: 91 77  Resp: 18 16  Temp: 98 F (36.7 C) 98 F (36.7 C)  SpO2: 96% 94%   9. Dysphagia. Cortrak out-Dysphagia #2 nectar thick liquids. Follow-up speech therapy 10. Hyperlipidemia. Lipitor 11. Urinary retention. Discontinue Foley tube. Continue Flomax 0.4 mg daily. Check PVR- 12. Acute hypoxic respiratory failure due to COVID-19. Patient did receive steroids/remdesivir/Actermaand remained on isolation through 02/14/2021 and discontinued per infectious disease. Was not vaccinated 13. Constipation>- results since rehab admission  14.  Neutropenia and anemia without fever - will ask for pharmacy consult , possibly Kirchoff be related to Brillinta, pt also received remdesivir and Actemra-  recheck CBC today    5/16: appreciate pharm consult- they think actemra or Cefepime are most like the cause- he is now off both.  15. Xerostomia, likely med related ad biotene LOS: 6 days A FACE TO FACE EVALUATION WAS PERFORMED  6/16 02/21/2021, 7:55 AM

## 2021-02-21 NOTE — Progress Notes (Signed)
Physical Therapy Session Note  Patient Details  Name: Leonard [name] MRN: [id number] Date of Birth: 10/23/1955  Today's Date: 02/21/2021 PT Individual Time: 1300-1418 PT Individual Time Calculation (min): 78 min   Short Term Goals: Week 1:  PT Short Term Goal 1 (Week 1): Pt will performed bed mobiltiy with superivsion assist PT Short Term Goal 2 (Week 1): Pt will transfer to [facility] with CGA and LRAD PT Short Term Goal 3 (Week 1): Pt will ambulate 154ft with CGA and LRAD PT Short Term Goal 4 (Week 1): Pt will ascend 4 steps with 1 rail and min assist PT Short Term Goal 5 (Week 1): pt will improve 5xSTS to 27 sec to demonstrate decreased fall risk  Skilled Therapeutic Interventions/Progress Updates:  Patient supine in bed on entrance to room. Patient alert and agreeable to PT session. Patient denied pain during session.  Therapeutic Activity: Bed Mobility: Patient performed supine <> sit to pt's L side in order to facilitate mobility learning as per home setup. Completes with supervision and good use of L elbow with push at bed surface with good trunk strength to reach upright seated position EOB.  VC/ tc required for technique. Transfers: Patient performed STS and SPVT transfers throughout session to RW and to no Ad with supervision. Provided verbal cues for safe hand progression of RUE.  Therapeutic Exercise: Pt guided in continuous reciprocation of RUE and BLE using NuStep L3 x with focus on BLE strengthening, increasing activity tolerance, improving BLE joint mobility. Pt maintains speed between 52-60 steps/ min and METs between 1.8-2.2 reaching 490 steps. Tolerated time and effort well.   Gait Training:  Patient ambulated 26' x1/ 145' x1 using RW with LUE AE with sup/ CGA. Provided vc/ tc for increasing LLE step height, level gaze, scan to L for improved attention, and upright posture.  Neuromuscular Re-ed: NMR facilitated during session with focus on standing balance and visual  scanning to improve L inattention. Pt guided in standing task of dynamic step to place colored bean bag to same color disc when number on colored disc called to pt. Discs setup on tray table in front of pt outside of BOS requiring dynamic stepping at elbow height. He is able to choose correct color bean bag and easily finds R four columns discs but requires assist with L two columns of discs. During second bout improves to only requiring assist to find far left column of discs. Progressed to final bout of standing on Airex pad and tray table closer to pt as well as pulled further to his L side. Improves in scanning, stating that this therapist is "hiding 'them' to the left probably" when he has trouble locating. Heavy CGA provided with no UE support while performing on Airex. Min A to assist with stepping off Airex. NMR performed for improvements in motor control and coordination, balance, sequencing, judgement, and self confidence/ efficacy in performing all aspects of mobility at highest level of independence.   At end of session on return to room, pt requests to toilet. Toilet transfer w/c --> toilet with supervision and pt using safety rail as well as w/c armrest for UE support. CGA provided for balance while pt doffs pants. Max A for pericare. Min A for donning pants. Ambulatory transfer to EOB with no AD and CGA.   Patient supine in bed at end of session with brakes locked, bed alarm set, and all needs within reach.     Therapy Documentation Precautions:  Precautions Precautions: Fall  Precaution Comments: L neglect, LUE hemi, cognitive deficits, nectar thick/dys 2 diet, HOB > 30 degrees Restrictions Weight Bearing Restrictions: No LUE Weight Bearing: Weight bearing as tolerated Pain: Pain Assessment Pain Score: 0-No pain  Therapy/Group: Individual Therapy  Loel Dubonnet PT, DPT 02/21/2021, 4:52 PM

## 2021-02-21 NOTE — Progress Notes (Signed)
Speech Language Pathology Daily Session Note  Patient Details  Name: Leonard [name] MRN: [id number] Date of Birth: November 20, 1955  Today's Date: 02/21/2021 SLP Individual Time: 0947-0962 SLP Individual Time Calculation (min): 42 min  Short Term Goals: Week 1: SLP Short Term Goal 1 (Week 1): Pt will tolerate Dys 2/NTL diet with no overt s/s aspiration with min A cues for use of swallow strategies as trained SLP Short Term Goal 2 (Week 1): Pt will tolerate trials of Dys 3, ice chips and thin liquids with minimal s/s aspiration and Min A cues for use of swallow strategies SLP Short Term Goal 3 (Week 1): Pt will recall basic novel/functional information with mod A verbal cues for compensatory strategies SLP Short Term Goal 4 (Week 1): Pt will follow 2-3 step simple and abstract directions with mod A SLP Short Term Goal 5 (Week 1): Pt will utilize compensatory strategies to increase intelligibility to 90% at sentence level with min A  Skilled Therapeutic Interventions:Skilled ST services focused on swallow skills. Pt required min A verbal cues for safety awareness when ambulating via RW to the sink to brush teeth. Pt required min A initiate next steps, preservation on task. SLP facilitated instruction of pharyngeal exercises CTAR and Mendelson, Pt was unable to return demonstration of Mendelson, but completed CTAR with mod A verbal cues for accuracy in 10 opportunities. Pt consumed thin liquids via cup sips, however demonstrated delayed/immediate coughs in 3 out 4 trials, further impacted by pt's reduced awareness and attempting to verbalize with liquid still in oral cavity. SLP recommends TSP trials of thin liquids next session. Pt was left in room with call bell within reach and chair alarm set. SLP recommends to continue skilled services.     Pain Pain Assessment Pain Score: 0-No pain  Therapy/Group: Individual Therapy  Chizaram Latino  [facility] 02/21/2021, 1:16 PM

## 2021-02-22 DIAGNOSIS — I63511 Cerebral infarction due to unspecified occlusion or stenosis of right middle cerebral artery: Secondary | ICD-10-CM | POA: Diagnosis not present

## 2021-02-22 NOTE — Progress Notes (Signed)
Speech Language Pathology Daily Session Note  Patient Details  Name: Leonard [name] MRN: [id number] Date of Birth: 1956-09-28  Today's Date: 02/22/2021 SLP Individual Time: 0315-9458 SLP Individual Time Calculation (min): 45 min  Short Term Goals: Week 1: SLP Short Term Goal 1 (Week 1): Pt will tolerate Dys 2/NTL diet with no overt s/s aspiration with min A cues for use of swallow strategies as trained SLP Short Term Goal 2 (Week 1): Pt will tolerate trials of Dys 3, ice chips and thin liquids with minimal s/s aspiration and Min A cues for use of swallow strategies SLP Short Term Goal 3 (Week 1): Pt will recall basic novel/functional information with mod A verbal cues for compensatory strategies SLP Short Term Goal 4 (Week 1): Pt will follow 2-3 step simple and abstract directions with mod A SLP Short Term Goal 5 (Week 1): Pt will utilize compensatory strategies to increase intelligibility to 90% at sentence level with min A  Skilled Therapeutic Interventions:   Patient seen for skilled ST session focusing on dysphagia goals. During informal discussion about his family and background, he became emotionally labile when talking about his mother who he stated he had cared for during the last 6 months of her life. He was able to recall one of dysphagia exercises (chin tuck against resistance) but required mod-maxA to return demonstrate this exercise, effortful swallow, Masako and Mendelsohn maneuvers and main difficulty seemed to be related to his ability to maintain attention and active participation in exercise. He exhibited immediate and delayed coughing with majority of sips of thin liquids (water). Patient continues to benefit from skilled SLP intervention to maximize speech-language, cognition and swallow function prior to discharge.  Pain Pain Assessment Pain Scale: 0-10 Faces Pain Scale: No hurt  Therapy/Group: Individual Therapy  Angela Nevin, MA, CCC-SLP Speech Therapy

## 2021-02-22 NOTE — Progress Notes (Signed)
PROGRESS NOTE   Subjective/Complaints: Discussed abnl labs Pt asked about eating grapefruit and his med  ROS- denies CP, SOB, N/V/D  Objective:   No results found. Recent Labs    02/21/21 0845  WBC 2.5*  HGB 12.2*  HCT 38.4*  PLT 151   No results for input(s): NA, K, CL, CO2, GLUCOSE, BUN, CREATININE, CALCIUM in the last 72 hours.  Intake/Output Summary (Last 24 hours) at 02/22/2021 0736 Last data filed at 02/21/2021 1901 Gross per 24 hour  Intake 356 ml  Output --  Net 356 ml        Physical Exam: Vital Signs Blood pressure (!) 143/53, pulse 68, temperature (!) 97.5 F (36.4 C), temperature source Oral, resp. rate 20, height 5\' 10"  (1.778 m), weight 75.3 kg, SpO2 99 %.  General: No acute distress Mood and affect are appropriate Heart: Regular rate and rhythm no rubs murmurs or extra sounds Lungs: Clear to auscultation, breathing unlabored, no rales or wheezes Abdomen: Positive bowel sounds, soft nontender to palpation, nondistended Extremities: No clubbing, cyanosis, or edema Skin: No evidence of breakdown, no evidence of rash  Neurologic: Cranial nerves II through XII intact, motor strength is 5/5 in RIght  deltoid, bicep, tricep, grip, hip flexor, knee extensors, ankle dorsiflexor and plantar flexor 2- LUE and 4/5 LLE Sensory exam normal sensation to light touch on RIght and reduced on left Cerebellar exam  Unable to perform LUE due to weakness Musculoskeletal: Full range of motion in all 4 extremities. No joint swelling   Assessment/Plan: 1. Functional deficits which require 3+ hours per day of interdisciplinary therapy in a comprehensive inpatient rehab setting.  Physiatrist is providing close team supervision and 24 hour management of active medical problems listed below.  Physiatrist and rehab team continue to assess barriers to discharge/monitor patient progress toward functional and medical  goals  Care Tool:  Bathing    Body parts bathed by patient: Chest,Abdomen,Front perineal area,Right upper leg,Left upper leg,Face,Left arm   Body parts bathed by helper: Right arm,Buttocks,Left lower leg,Right lower leg     Bathing assist Assist Level: Moderate Assistance - Patient 50 - 74%     Upper Body Dressing/Undressing Upper body dressing   What is the patient wearing?: Pull over shirt    Upper body assist Assist Level: Minimal Assistance - Patient > 75%    Lower Body Dressing/Undressing Lower body dressing      What is the patient wearing?: Incontinence brief,Pants     Lower body assist Assist for lower body dressing: Minimal Assistance - Patient > 75%     Toileting Toileting    Toileting assist Assist for toileting: Contact Guard/Touching assist     Transfers Chair/bed transfer  Transfers assist     Chair/bed transfer assist level: Contact Guard/Touching assist     Locomotion Ambulation   Ambulation assist      Assist level: Contact Guard/Touching assist Assistive device: Walker-rolling Max distance: 120 ft   Walk 10 feet activity   Assist     Assist level: Contact Guard/Touching assist Assistive device: Walker-rolling   Walk 50 feet activity   Assist    Assist level: Contact Guard/Touching assist Assistive device: Walker-rolling  Walk 150 feet activity   Assist Walk 150 feet activity did not occur: Safety/medical concerns         Walk 10 feet on uneven surface  activity   Assist Walk 10 feet on uneven surfaces activity did not occur: Safety/medical concerns         Wheelchair     Assist Will patient use wheelchair at discharge?: No Type of Wheelchair: Manual    Wheelchair assist level: Total Assistance - Patient < 25% Max wheelchair distance: 150    Wheelchair 50 feet with 2 turns activity    Assist        Assist Level: Total Assistance - Patient < 25%   Wheelchair 150 feet activity      Assist      Assist Level: Total Assistance - Patient < 25%   Blood pressure (!) 143/53, pulse 68, temperature (!) 97.5 F (36.4 C), temperature source Oral, resp. rate 20, height 5\' 10"  (1.778 m), weight 75.3 kg, SpO2 99 %.  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 Recommendations of 30-day event monitor as outpatient. -patient [place] shower -ELOS/Goals: ELOS 5/27  Continue CIR PT, OT, SLP evals 2. Antithrombotics: -DVT/anticoagulation:Subcutaneous heparin -antiplatelet therapy: Aspirin 81 mg daily and Brilinta 60 mg twice daily 3. Pain Management:Tylenol as needed 4. Mood:Provide emotional support -antipsychotic agents: N/A 5. Neuropsych: This patientiscapable of making decisions on hisown behalf. 6. Skin/Wound Care:Routine skin checks. Discontinue IC 7. Fluids/Electrolytes/Nutrition:Routine in and outs with follow-up chemistries 8. Elevated diastolic BP: continue to monitor TID Vitals:   02/21/21 2004 02/22/21 0505  BP: 126/76 (!) 143/53  Pulse: 82 68  Resp: 18 20  Temp: 98.5 F (36.9 C) (!) 97.5 F (36.4 C)  SpO2: 95% 99%   9. Dysphagia. Cortrak out-Dysphagia #2 nectar thick liquids. Follow-up speech therapy 10. Hyperlipidemia. Lipitor 11. Urinary retention. Discontinue Foley tube. Continue Flomax 0.4 mg daily. Check PVR- 12. Acute hypoxic respiratory failure due to COVID-19. Patient did receive steroids/remdesivir/Actermaand remained on isolation through 02/14/2021 and discontinued per infectious disease. Was not vaccinated 13. Constipation>- results since rehab admission  14.  Neutropenia and anemia without fever -   5/16: appreciate pharm consult- they think actemra or Cefepime are most like the cause- he is now off both. -CBC in  am  15. 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

## 2021-02-22 NOTE — Progress Notes (Signed)
Physical Therapy Session Note  Patient Details  Name: Leonard [name] MRN: [id number] Date of Birth: 03/17/1956  Today's Date: 02/22/2021 PT Individual Time: 1405-1500 PT Individual Time Calculation (min): 55 min   Short Term Goals: Week 1:  PT Short Term Goal 1 (Week 1): Pt will performed bed mobiltiy with superivsion assist PT Short Term Goal 2 (Week 1): Pt will transfer to [facility] with CGA and LRAD PT Short Term Goal 3 (Week 1): Pt will ambulate 117f with CGA and LRAD PT Short Term Goal 4 (Week 1): Pt will ascend 4 steps with 1 rail and min assist PT Short Term Goal 5 (Week 1): pt will improve 5xSTS to 27 sec to demonstrate decreased fall risk   Skilled Therapeutic Interventions/Progress Updates:   Pt received supine in bed and agreeable to PT. Supine>sit transfer with supevision assist and cues to attend to the LUE  Gait training wth RW through hall 2 x 155fwith supervision assist and cues for safety in turns and improved step height on the R.  Dynamic gait training in rehab gym with and without RW to navigate weave through cones and stepping over hock sticks on floor x 4 each. CGA from Pt without AD and min assist only for AD management to lift RW over hockey sticks   BLE strengthening to perform forward and lateral step ups onto 6 inch step with BUE support rails x 8 Bil in each direction. Assist from PT to stabilize the LUE on stair rail  Pt returned to room and performed ambulatory transfer to bed with supervision assist and UE support on RW. Sit>supine completed with supervision assist for safety, and left supine in bed with call bell in reach and all needs met.       Therapy Documentation Precautions:  Precautions Precautions: Fall Precaution Comments: L neglect, LUE hemi, cognitive deficits, nectar thick/dys 2 diet, HOB > 30 degrees Restrictions Weight Bearing Restrictions: No LUE Weight Bearing: Weight bearing as tolerated    Vital Signs: Therapy Vitals Temp: 98.6 F  (37 C) Pulse Rate: 89 Resp: 16 BP: 131/65 Patient Position (if appropriate): Lying Oxygen Therapy SpO2: 98 % O2 Device: Room Air     Therapy/Group: Individual Therapy  AuLorie Phenix/19/2022, 3:56 PM

## 2021-02-22 NOTE — Progress Notes (Signed)
Occupational Therapy Session Note  Patient Details  Name: Leonard [name] MRN: [id number] Date of Birth: March 26, 1956  Today's Date: 02/22/2021 OT Individual Time: 5597-4163 OT Individual Time Calculation (min): 56 min + 49 min   Short Term Goals: Week 1:  OT Short Term Goal 1 (Week 1): Pt will don shirt via hemi-technique with min A. OT Short Term Goal 2 (Week 1): Pt will don pants with mod A via hemi-technique. OT Short Term Goal 3 (Week 1): Pt will complete grooming task with min A in standing.  Skilled Therapeutic Interventions/Progress Updates:    Session 1: Pt received asleep in bed, easily awakened by voice, agreeable to therapy. Session focus on LUE NMR, L inattention, self-feeding, and NMR education in prep for improved ADL performance. Came to sitting EOB with close S + use of bed rail+ increased time. Denies pain and dizziness. Doffed shirt close S + min VCs to pull over head. Donned new shirt light min A to begin LUE threading. Set-up A for breakfast with desired items located on L side - pt able to ind located all desired items ind. Max A req to incorporate L hand into holding food items - pt noted to initiate incorporation of LUE frequently throughout session.   1:1 NMES applied to L wrist/ digit extensors with min A to facilitate grasp/release.  Ratio 1:3 Rate 35 pps Waveform- Asymmetric Ramp 1.0 Pulse 845 Intensity- 38 clicks Duration -   15 min  No adverse reactions after treatment and is skin intact. Denies pain.  STS and amb with RW to sink to brush teeth with close S and set-up of tooth brush. Min VCs to brush L side of hair. Amb back to bed and returned to supine close S. Pt left semi-reclined in bed with bed alarm engaged, call bell in reach, and all immediate needs met.     Session 2: Pt received seated EOB finishing lunch with NT present, denies pain, agreeable to therapy. Session focus on LUE NMR, L inattention, self-feeding, and NMR education in prep for improved  ADL performance. Set-up A for lunch with desired items located on L side - pt able to ind located all desired items ind. Max A req to incorporate L hand into holding food items.   1:1 NMES applied to L wrist/ digit extensors with min A to facilitate grasp/release for 5 min, and NMES applied to L biceps with min A to bring wash cloth to face.  Ratio 1:3 Rate 35 pps Waveform- Asymmetric Ramp 1.0 Pulse 364 Intensity- 38 clicks, 20 clicks Duration -   5 min, 5 min  No adverse reactions after treatment and is skin intact. Denies pain.  STS and amb with RW to and from gym with RW + CGA. Completed massed practice of L shoulder flexion/triceps extension/shoulder external/internation rotation with use of BZ board. Req min A to reduce trunk flexion compensation. Amb transfer back to bed and returned to supine close S.  Pt left semi-reclined in bed with bed alarm engaged, call bell in reach, and all immediate needs met.     Therapy Documentation Precautions:  Precautions Precautions: Fall Precaution Comments: L neglect, LUE hemi, cognitive deficits, nectar thick/dys 2 diet, HOB > 30 degrees Restrictions Weight Bearing Restrictions: No LUE Weight Bearing: Weight bearing as tolerated Pain:  denies ADL: See Care Tool for more details.   Therapy/Group: Individual Therapy  Volanda Napoleon MS, OTR/L  02/22/2021, 6:42 AM

## 2021-02-23 DIAGNOSIS — I63511 Cerebral infarction due to unspecified occlusion or stenosis of right middle cerebral artery: Secondary | ICD-10-CM | POA: Diagnosis not present

## 2021-02-23 LAB — CBC WITH DIFFERENTIAL/PLATELET
Abs Immature Granulocytes: 0.01 10*3/uL (ref 0.00–0.07)
Basophils Absolute: 0 10*3/uL (ref 0.0–0.1)
Basophils Relative: 1 %
Eosinophils Absolute: 0.2 10*3/uL (ref 0.0–0.5)
Eosinophils Relative: 7 %
HCT: 35 % — ABNORMAL LOW (ref 39.0–52.0)
Hemoglobin: 11.3 g/dL — ABNORMAL LOW (ref 13.0–17.0)
Immature Granulocytes: 0 %
Lymphocytes Relative: 31 %
Lymphs Abs: 0.9 10*3/uL (ref 0.7–4.0)
MCH: 30.5 pg (ref 26.0–34.0)
MCHC: 32.3 g/dL (ref 30.0–36.0)
MCV: 94.3 fL (ref 80.0–100.0)
Monocytes Absolute: 0.5 10*3/uL (ref 0.1–1.0)
Monocytes Relative: 20 %
Neutro Abs: 1.1 10*3/uL — ABNORMAL LOW (ref 1.7–7.7)
Neutrophils Relative %: 41 %
Platelets: 125 10*3/uL — ABNORMAL LOW (ref 150–400)
RBC: 3.71 MIL/uL — ABNORMAL LOW (ref 4.22–5.81)
RDW: 15.8 % — ABNORMAL HIGH (ref 11.5–15.5)
WBC: 2.7 10*3/uL — ABNORMAL LOW (ref 4.0–10.5)
nRBC: 0 % (ref 0.0–0.2)

## 2021-02-23 NOTE — Progress Notes (Signed)
Physical Therapy Session Note  Patient Details  Name: Leonard [name] MRN: [id number] Date of Birth: [birth year]  Today's Date: 02/23/2021 PT Individual Time: 4315-4008 PT Individual Time Calculation (min): 60 min   Short Term Goals: Week 1:  PT Short Term Goal 1 (Week 1): Pt will performed bed mobiltiy with superivsion assist PT Short Term Goal 2 (Week 1): Pt will transfer to [facility] with CGA and LRAD PT Short Term Goal 3 (Week 1): Pt will ambulate 171f with CGA and LRAD PT Short Term Goal 4 (Week 1): Pt will ascend 4 steps with 1 rail and min assist PT Short Term Goal 5 (Week 1): pt will improve 5xSTS to 27 sec to demonstrate decreased fall risk  Skilled Therapeutic Interventions/Progress Updates:   Pt received supine in bed and agreeable to PT. Supine>sit transfer with increased time and supervision assist on the L side of the bed with cues for use of LUE to push into sitting. Gait training through hall 2 x 2570fwith CGA-supervision assist to improve reciprocal amr swing without no AD. Pt instructed in dynamic balance training with wii fit balance board table tilt x 2 bout and and penguin slide x 1 with min assist for improved use of ankle strategy to control COM. wii boling wihle in standing on airex pad with min assist for safety with multiple near LOB requiring assist to correct from PT. Pt able to tolerate standing on airex pad x 8 frames before requesting to return to room due to fatigue.  Pt returned to room and performed ambulatory transfer to bed with supervision assist for safety and cues for awareness of the LUE. Sit>supine completed with supervision assist with cues for safety, and left supine in bed with call bell in reach and all needs met. Family present with questions about follow-up therapy. PT recommends OPPT if pt is able to be transported to and from appointments with education to family on benefits and HHPT and OPPT. Family agrees with OPPT for follow up. Will communicate with rehab  team.        Therapy Documentation Precautions:  Precautions Precautions: Fall Precaution Comments: L neglect, LUE hemi, cognitive deficits, nectar thick/dys 2 diet, HOB > 30 degrees Restrictions Weight Bearing Restrictions: No LUE Weight Bearing: Weight bearing as tolerated   Pain: Pain Assessment Pain Score: 0-No pain    Therapy/Group: Individual Therapy  AuLorie Phenix/20/2022, 6:24 PM

## 2021-02-23 NOTE — Progress Notes (Signed)
Speech Language Pathology Weekly Progress and Session Note  Patient Details  Name: Leonard [name] MRN: [id number] Date of Birth: 05/21/1956  Beginning of progress report period: [place] 13, 2022 End of progress report period: [place] 20, 2022  Short Term Goals: Week 1: SLP Short Term Goal 1 (Week 1): Pt will tolerate Dys 2/NTL diet with no overt s/s aspiration with min A cues for use of swallow strategies as trained SLP Short Term Goal 1 - Progress (Week 1): Met SLP Short Term Goal 2 (Week 1): Pt will tolerate trials of Dys 3, ice chips and thin liquids with minimal s/s aspiration and Min A cues for use of swallow strategies SLP Short Term Goal 2 - Progress (Week 1): Not met SLP Short Term Goal 3 (Week 1): Pt will recall basic novel/functional information with mod A verbal cues for compensatory strategies SLP Short Term Goal 3 - Progress (Week 1): Met SLP Short Term Goal 4 (Week 1): Pt will follow 2-3 step simple and abstract directions with mod A SLP Short Term Goal 4 - Progress (Week 1): Met SLP Short Term Goal 5 (Week 1): Pt will utilize compensatory strategies to increase intelligibility to 90% at sentence level with min A SLP Short Term Goal 5 - Progress (Week 1): Met    New Short Term Goals: Week 2: SLP Short Term Goal 1 (Week 2): STG=LTG due to short ELOS (d/c 5/27)  Weekly Progress Updates: Pt made progress meeting 4 out 5 goal this reporting period. Pt has participated in thin liquid and dys 3 texture trials as well as pharyngeal exercises with plan repeat MBS next report period. Pt continues to demonstrated mod-min s/s aspiration when consuming thin liquid trial. SLP continued to recommend nectar thick liquids and dys 2 texture diet. Pt had made improvement in recall, following commands and speech intelligibility, however poor awareness of acute deficit's impact on daily function impede progress. Education still needs to be completed. Pt would continue to benefit from skilled ST services in  order to maximize functional independence and reduce burden of care, requiring 24 horu supervision and continued ST services.      Intensity: Minumum of 1-2 x/day, 30 to 90 minutes Frequency: 3 to 5 out of 7 days Duration/Length of Stay: 5/27 Treatment/Interventions: Cognitive remediation/compensation;Speech/Language facilitation;Therapeutic Activities;Therapeutic Exercise;Functional tasks;Cueing hierarchy;Dysphagia/aspiration precaution training;Medication managment;Patient/family education  General    Pain Pain Assessment Pain Score: 0-No pain  Therapy/Group: Individual Therapy  Chanler Schreiter  [facility] 02/23/2021, 3:44 PM

## 2021-02-23 NOTE — Progress Notes (Signed)
PROGRESS NOTE   Subjective/Complaints:  Pt slept well last noc , discussed effect of grapefruit ingestion on his medications ROS- denies CP, SOB, N/V/D  Objective:   No results found. Recent Labs    02/21/21 0845 02/23/21 0619  WBC 2.5* 2.7*  HGB 12.2* 11.3*  HCT 38.4* 35.0*  PLT 151 125*   No results for input(s): NA, K, CL, CO2, GLUCOSE, BUN, CREATININE, CALCIUM in the last 72 hours.  Intake/Output Summary (Last 24 hours) at 02/23/2021 7169 Last data filed at 02/22/2021 1805 Gross per 24 hour  Intake 720 ml  Output --  Net 720 ml        Physical Exam: Vital Signs Blood pressure 127/70, pulse 68, temperature 97.6 F (36.4 C), temperature source Oral, resp. rate 20, height 5\' 10"  (1.778 m), weight 75.3 kg, SpO2 97 %.  General: No acute distress Mood and affect are appropriate Heart: Regular rate and rhythm no rubs murmurs or extra sounds Lungs: Clear to auscultation, breathing unlabored, no rales or wheezes Abdomen: Positive bowel sounds, soft nontender to palpation, nondistended Extremities: No clubbing, cyanosis, or edema Skin: No evidence of breakdown, no evidence of rash  Neurologic: Cranial nerves II through XII intact, motor strength is 5/5 in RIght  deltoid, bicep, tricep, grip, hip flexor, knee extensors, ankle dorsiflexor and plantar flexor 2- LUE and 4/5 LLE Sensory exam normal sensation to light touch on RIght and reduced on left Cerebellar exam  Unable to perform LUE due to weakness Musculoskeletal: Full range of motion in all 4 extremities. No joint swelling   Assessment/Plan: 1. Functional deficits which require 3+ hours per day of interdisciplinary therapy in a comprehensive inpatient rehab setting.  Physiatrist is providing close team supervision and 24 hour management of active medical problems listed below.  Physiatrist and rehab team continue to assess barriers to discharge/monitor  patient progress toward functional and medical goals  Care Tool:  Bathing    Body parts bathed by patient: Chest,Abdomen,Front perineal area,Right upper leg,Left upper leg,Face,Left arm   Body parts bathed by helper: Right arm,Buttocks,Left lower leg,Right lower leg     Bathing assist Assist Level: Moderate Assistance - Patient 50 - 74%     Upper Body Dressing/Undressing Upper body dressing   What is the patient wearing?: Pull over shirt    Upper body assist Assist Level: Minimal Assistance - Patient > 75%    Lower Body Dressing/Undressing Lower body dressing      What is the patient wearing?: Incontinence brief,Pants     Lower body assist Assist for lower body dressing: Minimal Assistance - Patient > 75%     Toileting Toileting    Toileting assist Assist for toileting: Contact Guard/Touching assist     Transfers Chair/bed transfer  Transfers assist     Chair/bed transfer assist level: Contact Guard/Touching assist     Locomotion Ambulation   Ambulation assist      Assist level: Contact Guard/Touching assist Assistive device: Walker-rolling Max distance: 120 ft   Walk 10 feet activity   Assist     Assist level: Contact Guard/Touching assist Assistive device: Walker-rolling   Walk 50 feet activity   Assist    Assist  level: Contact Guard/Touching assist Assistive device: Walker-rolling    Walk 150 feet activity   Assist Walk 150 feet activity did not occur: Safety/medical concerns         Walk 10 feet on uneven surface  activity   Assist Walk 10 feet on uneven surfaces activity did not occur: Safety/medical concerns         Wheelchair     Assist Will patient use wheelchair at discharge?: No Type of Wheelchair: Manual    Wheelchair assist level: Total Assistance - Patient < 25% Max wheelchair distance: 150    Wheelchair 50 feet with 2 turns activity    Assist        Assist Level: Total Assistance -  Patient < 25%   Wheelchair 150 feet activity     Assist      Assist Level: Total Assistance - Patient < 25%   Blood pressure 127/70, pulse 68, temperature 97.6 F (36.4 C), temperature source Oral, resp. rate 20, height 5\' 10"  (1.778 m), weight 75.3 kg, SpO2 97 %.  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 Recommendations of 30-day event monitor as outpatient. -patient [place] shower -ELOS/Goals: ELOS 5/27  Continue CIR PT, OT, SLP evals 2. Antithrombotics: -DVT/anticoagulation:Subcutaneous heparin- cont to monitor plt would hold if signs of bleeding or PLT <50K  -antiplatelet therapy: Aspirin 81 mg daily and Brilinta 60 mg twice daily 3. Pain Management:Tylenol as needed 4. Mood:Provide emotional support -antipsychotic agents: N/A 5. Neuropsych: This patientiscapable of making decisions on hisown behalf. 6. Skin/Wound Care:Routine skin checks. Discontinue IC 7. Fluids/Electrolytes/Nutrition:Routine in and outs with follow-up chemistries 8. Normotensive at present but had elevated BPs at presentation Vitals:   02/22/21 1914 02/23/21 0522  BP: 134/66 127/70  Pulse: 91 68  Resp: 18 20  Temp: 98.3 F (36.8 C) 97.6 F (36.4 C)  SpO2: 98% 97%   9. Dysphagia. Cortrak out-Dysphagia #2 nectar thick liquids. Follow-up speech therapy 10. Hyperlipidemia. Lipitor 11. Urinary retention. Discontinue Foley tube. Continue Flomax 0.4 mg daily. Check PVR- 12. Acute hypoxic respiratory failure due to COVID-19. Patient did receive steroids/remdesivir/Actermaand remained on isolation through 02/14/2021 and discontinued per infectious disease. Was not vaccinated 13. Constipation>- results since rehab admission  14.  Neutropenia and anemia without fever -   5/16:  appreciate pharm consult- they think actemra or Cefepime are most like the cause- he is now off both. -WBC stable , plt down slightly , mild anemia as well  15. 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

## 2021-02-23 NOTE — Progress Notes (Signed)
Speech Language Pathology Daily Session Note  Patient Details  Name: Leonard [name] MRN: [id number] Date of Birth: [birth year]  Today's Date: 02/23/2021 SLP Individual Time: 1447-1530 SLP Individual Time Calculation (min): 43 min  Short Term Goals: Week 1: SLP Short Term Goal 1 (Week 1): Pt will tolerate Dys 2/NTL diet with no overt s/s aspiration with min A cues for use of swallow strategies as trained SLP Short Term Goal 2 (Week 1): Pt will tolerate trials of Dys 3, ice chips and thin liquids with minimal s/s aspiration and Min A cues for use of swallow strategies SLP Short Term Goal 3 (Week 1): Pt will recall basic novel/functional information with mod A verbal cues for compensatory strategies SLP Short Term Goal 4 (Week 1): Pt will follow 2-3 step simple and abstract directions with mod A SLP Short Term Goal 5 (Week 1): Pt will utilize compensatory strategies to increase intelligibility to 90% at sentence level with min A  Skilled Therapeutic Interventions: Skilled ST services focused on swallow and cognitive skills. Pt required mod A verbal cues for safety awareness during ambulation in room, to sink to brush teeth and the bathroom (urinated) with RW. Pt continues to demonstrate poor awareness of the impact of acute deficits, stating he is hoping to go home tomorrow, needs to clean up his garage and asked about walking down a hill in his yard with only a walking stick. SLP provided education pertaining to acute deficits and need for continued rehabilitation. Pt consumed 15 cup sips of thin liquids via cup sips, in which pt demonstrated delayed throat clear on 5 out 15 opportunities. Pt recalled most of the CTAR exercises however required mod A verbal cues to complete accurately due to poor attention. Pt was left with call bell within reach and bed alarm set. Recommend to continue ST services.  Pain Pain Assessment Pain Score: 0-No pain  Therapy/Group: Individual Therapy  Sanjna Haskew   [facility] 02/23/2021, 3:38 PM

## 2021-02-23 NOTE — Progress Notes (Signed)
Occupational Therapy Weekly Progress Note  Patient Details  Name: Leonard [name] MRN: [id number] Date of Birth: [birth year]  Beginning of progress report period: [name], 2022 End of progress report period: [name], 2022  Today's Date: 02/23/2021 OT Individual Time: 9163-8466 OT Individual Time Calculation (min): 68 min    Patient has met 3 of 3 short term goals.  Pt has made significant progress this reporting period with OT. He has demonstrated improvement in initiation and attention during ADL tasks, L visual scanning, carryover of hemi-techniques for dressing/grooming, and dynamic standing balance. He is currently S to min A for UBD, UBB, LBD, and CGA to S for functional transfers with RW.   Patient continues to demonstrate the following deficits: muscle weakness, decreased cardiorespiratoy endurance, impaired timing and sequencing, abnormal tone and unbalanced muscle activation, decreased attention to left, decreased problem solving and decreased memory and hemiplegia and therefore will continue to benefit from skilled OT intervention to enhance overall performance with BADL and Reduce care partner burden.  Patient progressing toward long term goals..  Continue plan of care.  OT Short Term Goals Week 2:  OT Short Term Goal 1 (Week 2): STG = LTG 2/2 ELOS  Skilled Therapeutic Interventions/Progress Updates:    Pt received semi-reclined in bed, denies pain, agreeable to therapy. Came sitting EOB with close S and use of bed features. Doffed shirt close S with VCs to pull over head. Donned new shirt with set-up A for L sleeve and mod VCs for hemi-technique. Doffed brief/pants close S. Donned brief with min A to thread BLE, donned shorts light min A to pull over L hip. Doffed B socks close S, donned Teds total A, and able to don shoes with set-up A. STS and amb to sink to complete oral and hair care with close S + RW. Pt able to apply tooth paste with one handed technique. Amb to and from gym with  RW + CGA. Session focus on LUE WB/NMR in prep for improved functional return. Pt transitioned to quadruped x2 with min A, able to support himself on LUE, but fatigued quickly. Additionally, completed 1x15 forward/backward rows, chest press, volley ball, and B shoulder flexion hold with 2 lb dowel rod + ace wrap to facilitate L grip. Pt completed 2 level 2 difficulty visuconstructive tasks with mod then min VCs for accurate completion + increased time. Finally, completed 3 min on Nustep for improved activity tolerance and LUE NMR. Amb transfer back to bed and returned to supine close S. Pt accurately recalls hemi-techniques and education reviewed in past sessions, but is perseverative that his DC is today despite it being tentatively in 1 week and having been corrected by this therapist and MD. Pt left semi-reclined in bed with bed alarm engaged, call bell in reach, and all immediate needs met.    Therapy Documentation Precautions:  Precautions Precautions: Fall Precaution Comments: L neglect, LUE hemi, cognitive deficits, nectar thick/dys 2 diet, HOB > 30 degrees Restrictions Weight Bearing Restrictions: No LUE Weight Bearing: Weight bearing as tolerated Pain:  denies ADL: See Care Tool for more details.   Therapy/Group: Individual Therapy  Volanda Napoleon MS, OTR/L  02/23/2021, 6:49 AM

## 2021-02-24 DIAGNOSIS — I63511 Cerebral infarction due to unspecified occlusion or stenosis of right middle cerebral artery: Secondary | ICD-10-CM | POA: Diagnosis not present

## 2021-02-24 DIAGNOSIS — K117 Disturbances of salivary secretion: Secondary | ICD-10-CM | POA: Diagnosis not present

## 2021-02-24 DIAGNOSIS — I6601 Occlusion and stenosis of right middle cerebral artery: Secondary | ICD-10-CM

## 2021-02-24 DIAGNOSIS — D649 Anemia, unspecified: Secondary | ICD-10-CM | POA: Diagnosis not present

## 2021-02-24 DIAGNOSIS — I69354 Hemiplegia and hemiparesis following cerebral infarction affecting left non-dominant side: Secondary | ICD-10-CM

## 2021-02-24 DIAGNOSIS — I69391 Dysphagia following cerebral infarction: Secondary | ICD-10-CM

## 2021-02-24 MED ORDER — ARTIFICIAL TEARS OPHTHALMIC OINT
1.0000 "application " | TOPICAL_OINTMENT | OPHTHALMIC | Status: DC | PRN
  Administered 2021-02-24: 1 via OPHTHALMIC
  Filled 2021-02-24: qty 3.5

## 2021-02-24 NOTE — Progress Notes (Signed)
Physical Therapy Session Note  Patient Details  Name: Leonard [name] MRN: [id number] Date of Birth: July 17, 1956  Today's Date: 02/24/2021 PT Individual Time: 1300-1354 PT Individual Time Calculation (min): 54 min   Short Term Goals: Week 1:  PT Short Term Goal 1 (Week 1): Pt will performed bed mobiltiy with superivsion assist PT Short Term Goal 2 (Week 1): Pt will transfer to [facility] with CGA and LRAD PT Short Term Goal 3 (Week 1): Pt will ambulate 116ft with CGA and LRAD PT Short Term Goal 4 (Week 1): Pt will ascend 4 steps with 1 rail and min assist PT Short Term Goal 5 (Week 1): pt will improve 5xSTS to 27 sec to demonstrate decreased fall risk  Skilled Therapeutic Interventions/Progress Updates:    Pt greeted supine in bed at start of session - pt agreeable to PT tx without reports of pain. Supine<>sit completed with supervision with use of bedrail and HOB elevated. Donned shoes with maxA for time management while seated EOB. Sit<>stand with CGA and no AD and ambulated from his room to ortho gym, ~149ft, with CGA and no AD. NMR with BITS system in standing. Completed activities involving short term memory, visual scanning, number sequencing, visual motor skills, and error recognition. Pt needing several seated rest breaks while completing these. Gait training on level ground with obstacles, weaving in/out of cones and also picking up cones from the ground height - requiring CGA for stability for this and cues for general sequencing. Gait training on 71ft ramp and on unlevel mulch with minA and no AD. Performed car transfers with car height set to simulate his Jeep that he will be going home in, completed with CGA. Pt ambulated back to his room with CGA and no AD, ~138ft, and ended session completing bed mobility with supervision and supine in bed with needs in reach and bed alarm on.   Therapy Documentation Precautions:  Precautions Precautions: Fall Precaution Comments: L neglect, LUE hemi,  cognitive deficits, nectar thick/dys 2 diet, HOB > 30 degrees Restrictions Weight Bearing Restrictions: No LUE Weight Bearing: Weight bearing as tolerated General:    Therapy/Group: Individual Therapy  Orrin Brigham 02/24/2021, 7:35 AM

## 2021-02-24 NOTE — Progress Notes (Signed)
Physical Therapy Weekly Progress Note  Patient Details  Name: Leonard [name] MRN: [id number] Date of Birth: 1956-09-04  Beginning of progress report period: Srinivasan 13, 2022 End of progress report period: Boateng 21, 2022  Today's Date: 02/24/2021 PT Individual Time: 1006-1100 PT Individual Time Calculation (min): 54 min   Patient has met 5 of 5 short term goals.  Pt has made significant progress towards LTG over the past week. Pt has progressed to supervision assist overall for bed mobility, transfers and gait. Pt's balance has significantly improved with 5xSTS increased by 9 seconds since Eval.    Patient continues to demonstrate the following deficits muscle weakness, muscle joint tightness and muscle paralysis, decreased cardiorespiratoy endurance, abnormal tone and unbalanced muscle activation, field cut, decreased attention to left and decreased standing balance, decreased postural control, hemiplegia and decreased balance strategies and therefore will continue to benefit from skilled PT intervention to increase functional independence with mobility.  Patient progressing toward long term goals..  Continue plan of care.  PT Short Term Goals Week 1:  PT Short Term Goal 1 (Week 1): Pt will performed bed mobiltiy with superivsion assist PT Short Term Goal 1 - Progress (Week 1): Met PT Short Term Goal 2 (Week 1): Pt will transfer to [facility] with CGA and LRAD PT Short Term Goal 2 - Progress (Week 1): Met PT Short Term Goal 3 (Week 1): Pt will ambulate 151f with CGA and LRAD PT Short Term Goal 3 - Progress (Week 1): Met PT Short Term Goal 4 (Week 1): Pt will ascend 4 steps with 1 rail and min assist PT Short Term Goal 4 - Progress (Week 1): Met PT Short Term Goal 5 (Week 1): pt will improve 5xSTS to 27 sec to demonstrate decreased fall risk PT Short Term Goal 5 - Progress (Week 1): Met Week 2:  PT Short Term Goal 1 (Week 2): STG=LTG due to ELOS  Skilled Therapeutic Interventions/Progress Updates:    Pt received supine in bed and agreeable to PT. Supine>sit transfer with supevision assist and cues for awareness of LUE.   [facility] assist x 6 throughout sessionwith min cues for use of LUE to push from chair to stand and use in support when sitting.   Gait training over level surface through rehab unit x [id number] supervision assist as well as over unlevel cement sidewalk [id number]  Well as through [facility] x [id number] assist throughout all gait training in simulated commnunity environment with only occasional cues for step height on the L.   Nustep reciprocal movement training with BUE/BLE and L hand splint x 8 min. Cues for symmetry on the LUE comapred to the R and safety awareness to protect H joint.   Pt performed 5xSTS: 22 sec (>15 sec indicates increased fall risk)  . Stair management training with supervision assist x 4 steps with RUE support. Step over step pattern to ascend and step to for descent.   Pt returned to room and performed stand pivot transfer to bed with supervision assist. Sit>supine completed with supervision assist, and left supine in bed with call bell in reach and all needs met.       Therapy Documentation Precautions:  Precautions Precautions: Fall Precaution Comments: L neglect, LUE hemi, cognitive deficits, nectar thick/dys 2 diet, HOB > 30 degrees Restrictions Weight Bearing Restrictions: No LUE Weight Bearing: Weight bearing as tolerated General:   Vital Signs:  Pain: Pain Assessment Pain Scale:  0-10 Pain Score: 0-No pain Vision/Perception     Mobility:   Locomotion :    Trunk/Postural Assessment :    Balance:   Exercises:   Other Treatments:     Therapy/Group: Individual Therapy  Lorie Phenix 02/24/2021, 11:07 AM

## 2021-02-24 NOTE — Progress Notes (Signed)
PROGRESS NOTE   Subjective/Complaints: Patient seen sitting up in bed this morning, eating breakfast.  He states he slept well overnight.  He has questions regarding discharge.  Encourage patient to drink more fluids.  He has questions regarding dry mouth again.  ROS: Denies CP, SOB, N/V/D  Objective:   No results found. Recent Labs    02/23/21 0619  WBC 2.7*  HGB 11.3*  HCT 35.0*  PLT 125*   No results for input(s): NA, K, CL, CO2, GLUCOSE, BUN, CREATININE, CALCIUM in the last 72 hours.  Intake/Output Summary (Last 24 hours) at 02/24/2021 0934 Last data filed at 02/23/2021 1758 Gross per 24 hour  Intake 300 ml  Output --  Net 300 ml        Physical Exam: Vital Signs Blood pressure (!) 143/76, pulse 74, temperature (!) 97.5 F (36.4 C), resp. rate 20, height 5\' 10"  (1.778 m), weight 75.3 kg, SpO2 98 %. Constitutional: No distress . Vital signs reviewed. HENT: Normocephalic.  Atraumatic. Eyes: EOMI. No discharge. Cardiovascular: No JVD.  RRR. Respiratory: Normal effort.  No stridor.  Bilateral clear to auscultation. GI: Non-distended.  BS +. Skin: Warm and dry.  Intact. Psych: Normal mood.  Normal behavior. Musc: No edema in extremities.  No tenderness in extremities. Neuro: Alert and oriented, except for date of month Facial weakness Motor: RUE/RLE: 5/5 proximal distal LUE: Shoulder abduction, elbow flexion/extension to -/5, handgrip 0/5 LLE: 4-/5 proximal distal  Assessment/Plan: 1. Functional deficits which require 3+ hours per day of interdisciplinary therapy in a comprehensive inpatient rehab setting.  Physiatrist is providing close team supervision and 24 hour management of active medical problems listed below.  Physiatrist and rehab team continue to assess barriers to discharge/monitor patient progress toward functional and medical goals  Care Tool:  Bathing    Body parts bathed by patient:  Chest,Abdomen,Front perineal area,Right upper leg,Left upper leg,Face,Left arm   Body parts bathed by helper: Right arm,Buttocks,Left lower leg,Right lower leg     Bathing assist Assist Level: Moderate Assistance - Patient 50 - 74%     Upper Body Dressing/Undressing Upper body dressing   What is the patient wearing?: Pull over shirt    Upper body assist Assist Level: Supervision/Verbal cueing    Lower Body Dressing/Undressing Lower body dressing      What is the patient wearing?: Incontinence brief,Pants     Lower body assist Assist for lower body dressing: Minimal Assistance - Patient > 75%     Toileting Toileting    Toileting assist Assist for toileting: Contact Guard/Touching assist     Transfers Chair/bed transfer  Transfers assist     Chair/bed transfer assist level: Contact Guard/Touching assist     Locomotion Ambulation   Ambulation assist      Assist level: Contact Guard/Touching assist Assistive device: Walker-rolling Max distance: 120 ft   Walk 10 feet activity   Assist     Assist level: Contact Guard/Touching assist Assistive device: Walker-rolling   Walk 50 feet activity   Assist    Assist level: Contact Guard/Touching assist Assistive device: Walker-rolling    Walk 150 feet activity   Assist Walk 150 feet activity did not occur:  Safety/medical concerns         Walk 10 feet on uneven surface  activity   Assist Walk 10 feet on uneven surfaces activity did not occur: Safety/medical concerns         Wheelchair     Assist Will patient use wheelchair at discharge?: No Type of Wheelchair: Manual    Wheelchair assist level: Total Assistance - Patient < 25% Max wheelchair distance: 150    Wheelchair 50 feet with 2 turns activity    Assist        Assist Level: Total Assistance - Patient < 25%   Wheelchair 150 feet activity     Assist      Assist Level: Total Assistance - Patient < 25%   Blood  pressure (!) 143/76, pulse 74, temperature (!) 97.5 F (36.4 C), resp. rate 20, height 5\' 10"  (1.778 m), weight 75.3 kg, SpO2 98 %.  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 Recommendations of 30-day event monitor as outpatient.  Continue CIR 2. Antithrombotics: -DVT/anticoagulation:Subcutaneous heparin- cont to monitor plt would hold if signs of bleeding or PLT <50K  -antiplatelet therapy: Aspirin 81 mg daily and Brilinta 60 mg twice daily 3. Pain Management:Tylenol as needed 4. Mood:Provide emotional support -antipsychotic agents: N/A 5. Neuropsych: This patientis?  Fully capable of making decisions on hisown behalf. 6. Skin/Wound Care:Routine skin checks.  7. Fluids/Electrolytes/Nutrition:Routine in and outs 8.  Blood pressure Vitals:   02/23/21 1925 02/24/21 0500  BP: 130/80 (!) 143/76  Pulse: 88 74  Resp: 20 20  Temp: 98.2 F (36.8 C) (!) 97.5 F (36.4 C)  SpO2: 98% 98%   Relatively controlled on 5/21 9. Post stroke dysphagia. Cortrak out-Dysphagia #2 nectar thick liquids. Follow-up speech therapy 10.  Hyperlipidemia: Lipitor 11. Urinary retention. Discontinued Foley tube. Continue Flomax 0.4 mg daily. 12. Acute hypoxic respiratory failure due to COVID-19. Patient did receive steroids/remdesivir/Actermaand remained on isolation through 02/14/2021 and discontinued per infectious disease. Was not vaccinated 13. Constipation-  Improved  14.  Pancytopenia   5/16: appreciate pharm consult- they think actemra or Cefepime are most like the cause- he is now off both.   WBC 2.7 on 5/20, labs ordered for Monday  Hemoglobin 11.3 on 5/20, labs ordered for Monday  Platelets 125 on 5/20, labs ordered for Monday 15. Xerostomia, likely med related    Continue biotene CYP3A4  metabolism affected by grapefruit which [place] potentiate atorvastatin and ticagrelor   LOS: 9 days A FACE TO FACE EVALUATION WAS PERFORMED  Leonard [name] Monday 02/24/2021, 9:34 AM

## 2021-02-26 DIAGNOSIS — I63511 Cerebral infarction due to unspecified occlusion or stenosis of right middle cerebral artery: Secondary | ICD-10-CM | POA: Diagnosis not present

## 2021-02-26 LAB — CBC WITH DIFFERENTIAL/PLATELET
Abs Immature Granulocytes: 0.02 10*3/uL (ref 0.00–0.07)
Basophils Absolute: 0 10*3/uL (ref 0.0–0.1)
Basophils Relative: 0 %
Eosinophils Absolute: 0.2 10*3/uL (ref 0.0–0.5)
Eosinophils Relative: 5 %
HCT: 39.3 % (ref 39.0–52.0)
Hemoglobin: 12.7 g/dL — ABNORMAL LOW (ref 13.0–17.0)
Immature Granulocytes: 1 %
Lymphocytes Relative: 25 %
Lymphs Abs: 1.1 10*3/uL (ref 0.7–4.0)
MCH: 30.5 pg (ref 26.0–34.0)
MCHC: 32.3 g/dL (ref 30.0–36.0)
MCV: 94.5 fL (ref 80.0–100.0)
Monocytes Absolute: 0.7 10*3/uL (ref 0.1–1.0)
Monocytes Relative: 17 %
Neutro Abs: 2.2 10*3/uL (ref 1.7–7.7)
Neutrophils Relative %: 52 %
Platelets: 137 10*3/uL — ABNORMAL LOW (ref 150–400)
RBC: 4.16 MIL/uL — ABNORMAL LOW (ref 4.22–5.81)
RDW: 15.5 % (ref 11.5–15.5)
WBC: 4.3 10*3/uL (ref 4.0–10.5)
nRBC: 0 % (ref 0.0–0.2)

## 2021-02-26 NOTE — Progress Notes (Signed)
Speech Language Pathology Daily Session Note  Patient Details  Name: Leonard [name] MRN: [id number] Date of Birth: Dec 24, 1955  Today's Date: 02/26/2021 SLP Individual Time: 7017-7939 SLP Individual Time Calculation (min): 57 min  Short Term Goals: Week 2: SLP Short Term Goal 1 (Week 2): STG=LTG due to short ELOS (d/c 5/27)  Skilled Therapeutic Interventions:Skilled ST services focused on swallow and cognitive skills. SLP facilitated basic-mildly complex problem solving, sustained attention, recall and error awareness in novel card task, Blink. Pt required supervision A verbal cues fade to mod I in basic problem solving task and min A increase to mod A verbal cues for mildly complex problem solving due to fading attention impacting problem solving and error awareness. Pt was able to recall CTAR exercise with min A verbal cues. Pt brushed teeth at sink requiring min A verbal cues for safety awareness when standing up from [facility] to walker and sitting back down. Pt consumed thin liquid cup sip trials with x2 delayed coughs noted out of 10 trials. SLP plans for MBS on 5/25. Pt was left in room with call bell within reach and chair alarm set. SLP recommends to continue skilled services.     Pain Pain Assessment Pain Score: 0-No pain  Therapy/Group: Individual Therapy  Katelan Hirt  [facility] 02/26/2021, 12:49 PM

## 2021-02-26 NOTE — Progress Notes (Signed)
Physical Therapy Session Note  Patient Details  Name: Leonard [name] MRN: [id number] Date of Birth: 07/07/1956  Today's Date: 02/26/2021 PT Individual Time: 1300-1330 PT Individual Time Calculation (min): 30 min   Short Term Goals: Week 2:  PT Short Term Goal 1 (Week 2): STG=LTG due to ELOS  Skilled Therapeutic Interventions/Progress Updates: Pt presents sitting in w/c and finishing lunch.  Pt agreeable to therapy.  Pt wheeled to gym for time conservation.  Pt amb multiple trials using SPC and CGA w/ verbal cueing for sequencing, especially w/ cane management for firm placement on ground.  Pt initially amb w/ 2 pt gait, then regresses to 3 point, but cueing needed for safety.  Pt performed obstacle course w/ SPC and verbal cues for stepping over and around.  Pt performed toe tapping on cones w/ alternating feet.  Pt very impulsive and tends not to wait for PT instructions.  Pt returned to room and remained in wc w/ chair alarm on and all needs in reach.     Therapy Documentation Precautions:  Precautions Precautions: Fall Precaution Comments: L neglect, LUE hemi, cognitive deficits, nectar thick/dys 2 diet, HOB > 30 degrees Restrictions Weight Bearing Restrictions: No LUE Weight Bearing: Non weight bearing General:   Vital Signs: Therapy Vitals Temp: 98.3 F (36.8 C) Pulse Rate: 94 Resp: 16 BP: 135/82 Patient Position (if appropriate): Sitting Oxygen Therapy SpO2: 99 % O2 Device: Room Air Pain:0/10 Pain Assessment Pain Score: 0-No pain Mobility:      Therapy/Group: Individual Therapy  [name] 02/26/2021, 2:05 PM

## 2021-02-26 NOTE — Progress Notes (Signed)
Occupational Therapy Session Note  Patient Details  Name: Leonard [name] MRN: [id number] Date of Birth: Apr 02, 1956  Today's Date: 02/26/2021 OT Individual Time: 0801-0901 OT Individual Time Calculation (min): 60 min    Short Term Goals: Week 1:  OT Short Term Goal 1 (Week 1): Pt will don shirt via hemi-technique with min A. OT Short Term Goal 1 - Progress (Week 1): Met OT Short Term Goal 2 (Week 1): Pt will don pants with mod A via hemi-technique. OT Short Term Goal 2 - Progress (Week 1): Met OT Short Term Goal 3 (Week 1): Pt will complete grooming task with min A in standing. OT Short Term Goal 3 - Progress (Week 1): Met  Skilled Therapeutic Interventions/Progress Updates:     Pt received in room with nursing at bedside administering medications. Pt having difficulty swallowing medications, instructed to sit EOB and tuck chin for swallowing strategies for medications. After extensive time with no success, pt agreeable to have remainder of medications crushed. Pt seen for toileting tasks, washing up sink side, and dressing tasks with hemi techniques with fair carryover. Pt req CGA for standing and walking with RW into bathroom for toileting tasks. Pt instructed in bathing and dressing tasks sink side with instruction in hemi dressing techniques. Pt required increased time for all ADLs this session. After tx, pt handed off to PT.  Therapy Documentation Precautions:  Precautions Precautions: Fall Precaution Comments: L neglect, LUE hemi, cognitive deficits, nectar thick/dys 2 diet, HOB > 30 degrees Restrictions Weight Bearing Restrictions: No LUE Weight Bearing: Non weight bearing  Pain: Pain Assessment Pain Scale: 0-10 Pain Score: 0-No pain   Therapy/Group: Individual Therapy  Dalexa Gentz 02/26/2021, 11:39 AM

## 2021-02-26 NOTE — Progress Notes (Signed)
Physical Therapy Session Note  Patient Details  Name: Leonard [name] MRN: [id number] Date of Birth: 07/02/1956  Today's Date: 02/26/2021 PT Individual Time: 0900-1000 PT Individual Time Calculation (min): 60 min   Short Term Goals: Week 1:  PT Short Term Goal 1 (Week 1): Pt will performed bed mobiltiy with superivsion assist PT Short Term Goal 1 - Progress (Week 1): Met PT Short Term Goal 2 (Week 1): Pt will transfer to [facility] with CGA and LRAD PT Short Term Goal 2 - Progress (Week 1): Met PT Short Term Goal 3 (Week 1): Pt will ambulate 129f with CGA and LRAD PT Short Term Goal 3 - Progress (Week 1): Met PT Short Term Goal 4 (Week 1): Pt will ascend 4 steps with 1 rail and min assist PT Short Term Goal 4 - Progress (Week 1): Met PT Short Term Goal 5 (Week 1): pt will improve 5xSTS to 27 sec to demonstrate decreased fall risk PT Short Term Goal 5 - Progress (Week 1): Met Week 2:  PT Short Term Goal 1 (Week 2): STG=LTG due to ELOS  Skilled Therapeutic Interventions/Progress Updates:    [facility]. Pt requested to have TED hose in place of yellow socks so he could fit in his shoes better, TED hose retrieved and max A to don B socks and shoes in sitting. Pt then directed in Sit to stand with Rolling walker and LUE grip assist supervision and gait training with Rolling walker supervision 300' x2 with one seated rest break between reps, VC for increased trunk extension and improved head carriage and overall posture. Pt then directed in 2x4 mins NuStep L4 with VC for attention to task for improved reciprocal BLE movement for gait training. Pt then directed in [place] with B[place] for improved attention to LLE and increased reciprocal movement in BLE with RUE to assist intermittently for direction changes, min A to complete 100'.  Pt requested to remain in [place] and left as such, All needs in reach and in good condition. Call light in hand.  And alarm belt set.    Therapy Documentation Precautions:  Precautions Precautions: Fall Precaution Comments: L neglect, LUE hemi, cognitive deficits, nectar thick/dys 2 diet, HOB > 30 degrees Restrictions Weight Bearing Restrictions: No LUE Weight Bearing: Non weight bearing General:   Vital Signs: Therapy Vitals Temp: 98.3 F (36.8 C) Pulse Rate: 94 Resp: 16 BP: 135/82 Patient Position (if appropriate): Sitting Oxygen Therapy SpO2: 99 % O2 Device: Room Air Pain: Pain Assessment Pain Score: 0-No pain Mobility:   Locomotion :    Trunk/Postural Assessment :    Balance:   Exercises:   Other Treatments:      Therapy/Group: Individual Therapy  HJunie Panning5/23/2022, 2:14 PM

## 2021-02-26 NOTE — Progress Notes (Signed)
PROGRESS NOTE   Subjective/Complaints:  CBC not back yet ROS: Denies CP, SOB, N/V/D  Objective:   No results found. No results for input(s): WBC, HGB, HCT, PLT in the last 72 hours. No results for input(s): NA, K, CL, CO2, GLUCOSE, BUN, CREATININE, CALCIUM in the last 72 hours.  Intake/Output Summary (Last 24 hours) at 02/26/2021 0815 Last data filed at 02/26/2021 0425 Gross per 24 hour  Intake 417 ml  Output 550 ml  Net -133 ml        Physical Exam: Vital Signs Blood pressure (!) 135/93, pulse 77, temperature 98 F (36.7 C), temperature source Oral, resp. rate 16, height 5\' 10"  (1.778 m), weight 75.3 kg, SpO2 95 %.  General: No acute distress Mood and affect are appropriate Heart: Regular rate and rhythm no rubs murmurs or extra sounds Lungs: Clear to auscultation, breathing unlabored, no rales or wheezes Abdomen: Positive bowel sounds, soft nontender to palpation, nondistended Extremities: No clubbing, cyanosis, or edema Skin: No evidence of breakdown, no evidence of rash   Motor: RUE/RLE: 5/5 proximal distal LUE: Shoulder abduction, elbow flexion/extension to -/5, handgrip 0/5 LLE: 4-/5 proximal distal  Assessment/Plan: 1. Functional deficits which require 3+ hours per day of interdisciplinary therapy in a comprehensive inpatient rehab setting.  Physiatrist is providing close team supervision and 24 hour management of active medical problems listed below.  Physiatrist and rehab team continue to assess barriers to discharge/monitor patient progress toward functional and medical goals  Care Tool:  Bathing    Body parts bathed by patient: Chest,Abdomen,Front perineal area,Right upper leg,Left upper leg,Face,Left arm   Body parts bathed by helper: Right arm,Buttocks,Left lower leg,Right lower leg     Bathing assist Assist Level: Moderate Assistance - Patient 50 - 74%     Upper Body  Dressing/Undressing Upper body dressing   What is the patient wearing?: Pull over shirt    Upper body assist Assist Level: Supervision/Verbal cueing    Lower Body Dressing/Undressing Lower body dressing      What is the patient wearing?: Incontinence brief,Pants     Lower body assist Assist for lower body dressing: Minimal Assistance - Patient > 75%     Toileting Toileting    Toileting assist Assist for toileting: Contact Guard/Touching assist     Transfers Chair/bed transfer  Transfers assist     Chair/bed transfer assist level: Contact Guard/Touching assist     Locomotion Ambulation   Ambulation assist      Assist level: Contact Guard/Touching assist Assistive device: Walker-rolling Max distance: 120 ft   Walk 10 feet activity   Assist     Assist level: Contact Guard/Touching assist Assistive device: Walker-rolling   Walk 50 feet activity   Assist    Assist level: Contact Guard/Touching assist Assistive device: Walker-rolling    Walk 150 feet activity   Assist Walk 150 feet activity did not occur: Safety/medical concerns         Walk 10 feet on uneven surface  activity   Assist Walk 10 feet on uneven surfaces activity did not occur: Safety/medical concerns         Wheelchair     Assist Will patient use wheelchair  at discharge?: No Type of Wheelchair: Manual    Wheelchair assist level: Total Assistance - Patient < 25% Max wheelchair distance: 150    Wheelchair 50 feet with 2 turns activity    Assist        Assist Level: Total Assistance - Patient < 25%   Wheelchair 150 feet activity     Assist      Assist Level: Total Assistance - Patient < 25%   Blood pressure (!) 135/93, pulse 77, temperature 98 F (36.7 C), temperature source Oral, resp. rate 16, height 5\' 10"  (1.778 m), weight 75.3 kg, SpO2 95 %.  Medical Problem List and Plan: 1.Left-sided hemiparesis with dysarthria and dysphagiasecondary  to right MCA infarction with complete occlusion of right middle cerebral artery status post revascularization. Recommendations of 30-day event monitor as outpatient.  Continue CIR PT, OT, SLP  2. Antithrombotics: -DVT/anticoagulation:Subcutaneous heparin- cont to monitor plt would hold if signs of bleeding or PLT <50K  -antiplatelet therapy: Aspirin 81 mg daily and Brilinta 60 mg twice daily 3. Pain Management:Tylenol as needed 4. Mood:Provide emotional support -antipsychotic agents: N/A 5. Neuropsych: This patientis?  Fully capable of making decisions on hisown behalf. 6. Skin/Wound Care:Routine skin checks.  7. Fluids/Electrolytes/Nutrition:Routine in and outs 8.  Blood pressure Vitals:   02/25/21 1909 02/26/21 0423  BP: (!) 141/70 (!) 135/93  Pulse: 79 77  Resp: 17 16  Temp: 97.7 F (36.5 C) 98 F (36.7 C)  SpO2: 97% 95%   Relatively controlled on 5/23 9. Post stroke dysphagia. Cortrak out-Dysphagia #2 nectar thick liquids. Follow-up speech therapy 10.  Hyperlipidemia: Lipitor 11. Urinary retention. Discontinued Foley tube. Continue Flomax 0.4 mg daily. 12. Acute hypoxic respiratory failure due to COVID-19. Patient did receive steroids/remdesivir/Actermaand remained on isolation through 02/14/2021 and discontinued per infectious disease. Was not vaccinated 13. Constipation-  Improved  14.  Pancytopenia   5/16: appreciate pharm consult- they think actemra or Cefepime are most like the cause- he is now off both.   WBC 2.7 on 5/20, labs ordered for Monday  Hemoglobin 11.3 on 5/20, labs ordered for Monday  Platelets 125 on 5/20, labs ordered for Monday 15. Xerostomia, likely med related    Continue biotene CYP3A4 metabolism affected by grapefruit which [place] potentiate atorvastatin and ticagrelor   LOS: 11 days A FACE TO FACE EVALUATION WAS PERFORMED  Wednesday 02/26/2021, 8:15 AM

## 2021-02-27 DIAGNOSIS — I63511 Cerebral infarction due to unspecified occlusion or stenosis of right middle cerebral artery: Secondary | ICD-10-CM | POA: Diagnosis not present

## 2021-02-27 MED ORDER — POLYVINYL ALCOHOL 1.4 % OP SOLN
1.0000 [drp] | OPHTHALMIC | Status: DC | PRN
  Administered 2021-02-27 – 2021-02-28 (×2): 1 [drp] via OPHTHALMIC
  Filled 2021-02-27: qty 15

## 2021-02-27 NOTE — Progress Notes (Signed)
PROGRESS NOTE   Subjective/Complaints: Eye pain /irritation on RIght side  CBC reviewed,  improved  ROS: Denies CP, SOB, N/V/D  Objective:   No results found. Recent Labs    02/26/21 1207  WBC 4.3  HGB 12.7*  HCT 39.3  PLT 137*   No results for input(s): NA, K, CL, CO2, GLUCOSE, BUN, CREATININE, CALCIUM in the last 72 hours.  Intake/Output Summary (Last 24 hours) at 02/27/2021 0801 Last data filed at 02/26/2021 1845 Gross per 24 hour  Intake 354 ml  Output 650 ml  Net -296 ml        Physical Exam: Vital Signs Blood pressure 121/77, pulse 69, temperature 98.2 F (36.8 C), temperature source Oral, resp. rate 18, height 5\' 10"  (1.778 m), weight 75.3 kg, SpO2 94 %. EENT- eyes non injected no discharge , no hemorhage General: No acute distress Mood and affect are appropriate Heart: Regular rate and rhythm no rubs murmurs or extra sounds Lungs: Clear to auscultation, breathing unlabored, no rales or wheezes Abdomen: Positive bowel sounds, soft nontender to palpation, nondistended Extremities: No clubbing, cyanosis, or edema Skin: No evidence of breakdown, no evidence of rash   Motor: RUE/RLE: 5/5 proximal distal LUE: Shoulder abduction, elbow flexion/extension to -/5, handgrip 0/5 LLE: 4-/5 proximal distal  Assessment/Plan: 1. Functional deficits which require 3+ hours per day of interdisciplinary therapy in a comprehensive inpatient rehab setting.  Physiatrist is providing close team supervision and 24 hour management of active medical problems listed below.  Physiatrist and rehab team continue to assess barriers to discharge/monitor patient progress toward functional and medical goals  Care Tool:  Bathing    Body parts bathed by patient: Chest,Abdomen,Front perineal area,Right upper leg,Left upper leg,Face,Left arm   Body parts bathed by helper: Right arm,Buttocks,Left lower leg,Right lower leg      Bathing assist Assist Level: Moderate Assistance - Patient 50 - 74%     Upper Body Dressing/Undressing Upper body dressing   What is the patient wearing?: Pull over shirt    Upper body assist Assist Level: Moderate Assistance - Patient 50 - 74%    Lower Body Dressing/Undressing Lower body dressing      What is the patient wearing?: Incontinence brief,Pants     Lower body assist Assist for lower body dressing: Minimal Assistance - Patient > 75%     Toileting Toileting    Toileting assist Assist for toileting: Contact Guard/Touching assist     Transfers Chair/bed transfer  Transfers assist     Chair/bed transfer assist level: Contact Guard/Touching assist     Locomotion Ambulation   Ambulation assist      Assist level: Contact Guard/Touching assist Assistive device: Cane-straight Max distance: 90   Walk 10 feet activity   Assist     Assist level: Contact Guard/Touching assist Assistive device: Cane-straight   Walk 50 feet activity   Assist    Assist level: Contact Guard/Touching assist Assistive device: Cane-straight    Walk 150 feet activity   Assist Walk 150 feet activity did not occur: Safety/medical concerns         Walk 10 feet on uneven surface  activity   Assist Walk 10 feet on  uneven surfaces activity did not occur: Safety/medical concerns         Wheelchair     Assist Will patient use wheelchair at discharge?: No Type of Wheelchair: Manual    Wheelchair assist level: Total Assistance - Patient < 25% Max wheelchair distance: 150    Wheelchair 50 feet with 2 turns activity    Assist        Assist Level: Total Assistance - Patient < 25%   Wheelchair 150 feet activity     Assist      Assist Level: Total Assistance - Patient < 25%   Blood pressure 121/77, pulse 69, temperature 98.2 F (36.8 C), temperature source Oral, resp. rate 18, height 5\' 10"  (1.778 m), weight 75.3 kg, SpO2 94  %.  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 Recommendations of 30-day event monitor as outpatient.  Continue CIR PT, OT, SLP  Informed family that D/C date can be moved to 5/26 2. Antithrombotics: -DVT/anticoagulation:Subcutaneous heparin- cont to monitor plt would hold if signs of bleeding or PLT <50K  -antiplatelet therapy: Aspirin 81 mg daily and Brilinta 60 mg twice daily 3. Pain Management:Tylenol as needed 4. Mood:Provide emotional support -antipsychotic agents: N/A 5. Neuropsych: This patientis?  Fully capable of making decisions on hisown behalf. 6. Skin/Wound Care:Routine skin checks.  7. Fluids/Electrolytes/Nutrition:Routine in and outs 8.  Blood pressure Vitals:   02/26/21 1910 02/27/21 0421  BP: 123/71 121/77  Pulse: 83 69  Resp: 19 18  Temp: 98 F (36.7 C) 98.2 F (36.8 C)  SpO2: 97% 94%   Relatively controlled on 5/24 9. Post stroke dysphagia. Cortrak out-Dysphagia #2 nectar thick liquids. Follow-up speech therapy 10.  Hyperlipidemia: Lipitor 11. Urinary retention. Discontinued Foley tube. Continue Flomax 0.4 mg daily. 12. Acute hypoxic respiratory failure due to COVID-19. Patient did receive steroids/remdesivir/Actermaand remained on isolation through 02/14/2021 and discontinued per infectious disease. Was not vaccinated 13. Constipation-  Improved  14.  Pancytopenia   5/16: appreciate pharm consult- they think actemra or Cefepime are most like the cause- he is now off both.   WBC 2.7 on 5/20, labs ordered for Monday  Hemoglobin 11.3 on 5/20, labs ordered for Monday  Platelets 125 on 5/20, labs ordered for Monday 15. 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  Dry eyes order saline drops LOS: 12 days A FACE TO FACE EVALUATION WAS PERFORMED  Friday 02/27/2021, 8:01 AM

## 2021-02-27 NOTE — Progress Notes (Signed)
Physical Therapy Session Note  Patient Details  Name: Leonard [name] MRN: [id number] Date of Birth: [birth year]  Today's Date: 02/27/2021 PT Individual Time: 1045-1125 PT Individual Time Calculation (min): 40 min   Short Term Goals: Week 2:  PT Short Term Goal 1 (Week 2): STG=LTG due to ELOS  Skilled Therapeutic Interventions/Progress Updates:    Pt greeted supine in bed at start of session. No reports of pain. Family (wife and son) at bedside for family education/training. Lengthy discussion regarding pt's current mobility status (supervision), use of SPC for mobility, L inattention, balance deficits, home safety training, fall prevention strategies, signs/symptoms of stroke (F.A.S.T), utilizing family assist, stroke recovery, and role of f/u therapies. All questions and concerns addressed. Bed mobility completed with supervision with bed features. Donned shoes with minA for slipping over heel while he sat EOB. Sit<>stand to [facility] with supervision and ambulated from his room to ortho gym with supervision and SPC, ~128ft. Completed car transfer with supervision with car height set to simulate their [facility] - increased difficulty raising LLE into car but capable with effort and time. Ambulated to main rehab gym with supervision and [facility] and completed stair training. Navigated up/down 2x4 steps (seated rest) with CGA. 1st trial with R handrail going up and R handrail going down. 2nd trial using SPC only. Son reports he plans on installing a rail at the house to improve home entrance. Performed furniture transfers from low sitting sofa couch with supervision and SPC. Also performed bed mobility on regular height flat bed in ADL apartment room with supervision as well, including sit<>supine and rolling. Increased difficulty perform supine to sit but capable with time and effort. Ambulated back to his room with supervision and SPC and helped back to bed with supervision. Remained supine  In bed with  needs in reach and LUE supported by pillow, bed alarm activated and family at bedside.   Therapy Documentation Precautions:  Precautions Precautions: Fall Precaution Comments: L neglect, LUE hemi, cognitive deficits, nectar thick/dys 2 diet, HOB > 30 degrees Restrictions Weight Bearing Restrictions: No LUE Weight Bearing: Non weight bearing General:    Therapy/Group: Individual Therapy  Orrin Brigham 02/27/2021, 7:48 AM

## 2021-02-27 NOTE — Progress Notes (Signed)
Occupational Therapy Session Note  Patient Details  Name: Leonard [name] MRN: [id number] Date of Birth: 09-11-1956  Today's Date: 02/27/2021 OT Individual Time: 0254-2706 OT Individual Time Calculation (min): 25 min    Short Term Goals: Week 1:  OT Short Term Goal 1 (Week 1): Pt will don shirt via hemi-technique with min A. OT Short Term Goal 1 - Progress (Week 1): Met OT Short Term Goal 2 (Week 1): Pt will don pants with mod A via hemi-technique. OT Short Term Goal 2 - Progress (Week 1): Met OT Short Term Goal 3 (Week 1): Pt will complete grooming task with min A in standing. OT Short Term Goal 3 - Progress (Week 1): Met Week 2:  OT Short Term Goal 1 (Week 2): STG = LTG 2/2 ELOS   Skilled Therapeutic Interventions/Progress Updates:    Pt greeted at time of session semireclined in bed resting with family present, remained throughout session. Supine > sit Supervision and ambulated room <> gym CGA fading to supervision with [facility], had wife provide part of the way for practice. In gym, performed 2x10 weight bearing on large therapy ball, 2x10 forward rolls for passive shoulder flexion stretch, 1x10 lateral leans seated on each elbow with weight bearing for core strengthening and additional weight bearing. Briefly reviewed ROM techniques for home, stretches for LUE with and without cane. Pt ambulated back to room Supervision, in bed alarm on call bell in reach.   Therapy Documentation Precautions:  Precautions Precautions: Fall Precaution Comments: L neglect, LUE hemi, cognitive deficits, nectar thick/dys 2 diet, HOB > 30 degrees Restrictions Weight Bearing Restrictions: No LUE Weight Bearing: Non weight bearing   Therapy/Group: Individual Therapy  Viona Gilmore 02/27/2021, 1:02 PM

## 2021-02-27 NOTE — Progress Notes (Signed)
Physical Therapy Session Note  Patient Details  Name: Leonard [name] MRN: [id number] Date of Birth: 10/15/1955  Today's Date: 02/27/2021 PT Individual Time: 1400-1500 PT Individual Time Calculation (min): 60 min   Short Term Goals: Week 1:  PT Short Term Goal 1 (Week 1): Pt will performed bed mobiltiy with superivsion assist PT Short Term Goal 1 - Progress (Week 1): Met PT Short Term Goal 2 (Week 1): Pt will transfer to [facility] with CGA and LRAD PT Short Term Goal 2 - Progress (Week 1): Met PT Short Term Goal 3 (Week 1): Pt will ambulate 183f with CGA and LRAD PT Short Term Goal 3 - Progress (Week 1): Met PT Short Term Goal 4 (Week 1): Pt will ascend 4 steps with 1 rail and min assist PT Short Term Goal 4 - Progress (Week 1): Met PT Short Term Goal 5 (Week 1): pt will improve 5xSTS to 27 sec to demonstrate decreased fall risk PT Short Term Goal 5 - Progress (Week 1): Met Week 2:  PT Short Term Goal 1 (Week 2): STG=LTG due to ELOS  Skilled Therapeutic Interventions/Progress Updates:    pt received in bed and agreeable to therapy. Family present. Pt directed in supine>sit min A from pt's Rt side. Pt directed in donning shoes EOB SBA. Pt then directed in gait with SPC 300' SBA, then rest break in sitting. Pt directed in standing step ups with SPC CGA 6" step 2x10. Pt directed in obstacle course x2 with x5 cone weaving and stepping on/off air ex pad and standing 45s each SBA to complete, on second set pt directed to complete without SPC also SBA. Pt directed gait back to room SPC SBA. Pt returned to bed, SBA. PT retrieved blue and orange therabands for pt to DC home with, HEP given and went over with family present and all parties reported understanding and agreeable. Pt and family denied concerns about DC home and feel comfortable with current DC plan. Pt left in bed, All needs in reach and in good condition. Call light in hand.  And alarm set. famiyl present.    Therapy Documentation Precautions:   Precautions Precautions: Fall Precaution Comments: L neglect, LUE hemi, cognitive deficits, nectar thick/dys 2 diet, HOB > 30 degrees Restrictions Weight Bearing Restrictions: No LUE Weight Bearing: Non weight bearing General:   Vital Signs: Therapy Vitals Temp: 97.6 F (36.4 C) Pulse Rate: 81 Resp: (!) 21 BP: 128/62 Patient Position (if appropriate): Sitting Oxygen Therapy SpO2: 97 % O2 Device: Room Air Pain:   Mobility:   Locomotion :    Trunk/Postural Assessment :    Balance:   Exercises:   Other Treatments:      Therapy/Group: Individual Therapy  HJunie Panning5/24/2022, 3:33 PM

## 2021-02-27 NOTE — Progress Notes (Signed)
Occupational Therapy Session Note  Patient Details  Name: Leonard [name] MRN: [id number] Date of Birth: 11-27-1955  Today's Date: 02/27/2021 OT Individual Time: 0800-0901 OT Individual Time Calculation (min): 61 min    Short Term Goals: Week 2:  OT Short Term Goal 1 (Week 2): STG = LTG 2/2 ELOS  Skilled Therapeutic Interventions/Progress Updates:    Pt received semi-reclined in bed with wife and son present for family edu, agreeable to therapy. Denies pain throughout session. Came to sitting EOB close S with use of bed rails. Donned pants/shirt with close S + increased time + min VCs for hemi technique. STS and amb to toilet with SPC with close S. S for LB clothing management. Cont void of b/b. Req to wait to void more - NT aware of pt position. Educated family re decreasing falls risk in the home, LUE management/positioning, and cognitive deficits related to safety awareness, problem solving, L inattention, error awareness, and STM. Rec S for functional mobility and assist req for IADL management. Family with questions if pt can attempt to drive pontoon boat - not recommended at this time as pt will have to be medically cleared and evaluated to return to driving first. Family with no further questions at this time.   Pt left in bathroom with family present - NT aware.  Therapy Documentation Precautions:  Precautions Precautions: Fall Precaution Comments: L neglect, LUE hemi, cognitive deficits, nectar thick/dys 2 diet, HOB > 30 degrees Restrictions Weight Bearing Restrictions: No LUE Weight Bearing: Non weight bearing Pain:   denies ADL: See Care Tool for more details.  Therapy/Group: Individual Therapy  Claudie Revering MS, OTR/L  02/27/2021, 6:41 AM

## 2021-02-28 ENCOUNTER — Inpatient Hospital Stay (HOSPITAL_COMMUNITY): Payer: Medicare HMO

## 2021-02-28 DIAGNOSIS — I63511 Cerebral infarction due to unspecified occlusion or stenosis of right middle cerebral artery: Secondary | ICD-10-CM | POA: Diagnosis not present

## 2021-02-28 MED ORDER — ALBUTEROL SULFATE HFA 108 (90 BASE) MCG/ACT IN AERS: 2.0000 | INHALATION_SPRAY | RESPIRATORY_TRACT | 0 refills | Status: DC | PRN

## 2021-02-28 MED ORDER — SENNOSIDES-DOCUSATE SODIUM 8.6-50 MG PO TABS: 1.0000 | ORAL_TABLET | Freq: Every day | ORAL | Status: DC

## 2021-02-28 MED ORDER — PANTOPRAZOLE SODIUM 40 MG PO TBEC: 40.0000 mg | DELAYED_RELEASE_TABLET | Freq: Every day | ORAL | 0 refills | Status: DC

## 2021-02-28 MED ORDER — ACETAMINOPHEN 325 MG PO TABS: 650.0000 mg | ORAL_TABLET | ORAL | Status: DC | PRN

## 2021-02-28 MED ORDER — ATORVASTATIN CALCIUM 80 MG PO TABS: 80.0000 mg | ORAL_TABLET | Freq: Every day | ORAL | 0 refills | Status: DC

## 2021-02-28 MED ORDER — TAMSULOSIN HCL 0.4 MG PO CAPS: 0.4000 mg | ORAL_CAPSULE | Freq: Every day | ORAL | 0 refills | Status: DC

## 2021-02-28 MED ORDER — ZINC SULFATE 220 (50 ZN) MG PO CAPS: 220.0000 mg | ORAL_CAPSULE | Freq: Every day | ORAL | 0 refills | Status: DC

## 2021-02-28 MED ORDER — ASCORBIC ACID 500 MG PO TABS: 500.0000 mg | ORAL_TABLET | Freq: Every day | ORAL | 0 refills | Status: DC

## 2021-02-28 MED ORDER — TICAGRELOR 90 MG PO TABS: 90.0000 mg | ORAL_TABLET | Freq: Two times a day (BID) | ORAL | 0 refills | Status: DC

## 2021-02-28 MED ORDER — POLYVINYL ALCOHOL 1.4 % OP SOLN: 1.0000 [drp] | OPHTHALMIC | 0 refills | Status: DC | PRN

## 2021-02-28 NOTE — Progress Notes (Signed)
Physical Therapy Session Note  Patient Details  Name: Luie Laneve Lanzer MRN: [id number] Date of Birth: 10/30/1955  Today's Date: 02/28/2021 PT Individual Time: 0815-0830 PT Individual Time Calculation (min): 15 min  PT Amount of Missed Time (min): 15 Minutes PT Missed Treatment Reason: Nursing care  Short Term Goals: Week 2:  PT Short Term Goal 1 (Week 2): STG=LTG due to ELOS  Skilled Therapeutic Interventions/Progress Updates:    Pt received seated in bed receiving AM medications from nursing. Pt missed 15 min of therapy session to allow time to safely swallow each medication. Once pt done taking medications agreeable to PT session. Pt had not yet eaten breakfast. Pt is mod I to sit up to EOB. Provided full supervision assist for pt to eat his breakfast with cues to swallow each bite and alternate solids and liquids. NT able to take over at end of session to allow patient to finish eating breakfast. Pt left seated EOB in care of NT.  Therapy Documentation Precautions:  Precautions Precautions: Fall Precaution Comments: L neglect, LUE hemi, cognitive deficits, nectar thick/dys 2 diet, HOB > 30 degrees Restrictions Weight Bearing Restrictions: No LUE Weight Bearing: Non weight bearing    Therapy/Group: Individual Therapy   Peter Congo, PT, DPT, CSRS  02/28/2021, 12:13 PM
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
## 2021-02-28 NOTE — Progress Notes (Signed)
PROGRESS NOTE   Subjective/Complaints: No eye c/os, discussed family training which went well Pt had questions about gardening and mowing, both of which are not advisable yet  ROS: Denies CP, SOB, N/V/D  Objective:   No results found. Recent Labs    02/26/21 1207  WBC 4.3  HGB 12.7*  HCT 39.3  PLT 137*   No results for input(s): NA, K, CL, CO2, GLUCOSE, BUN, CREATININE, CALCIUM in the last 72 hours.  Intake/Output Summary (Last 24 hours) at 02/28/2021 0819 Last data filed at 02/27/2021 1700 Gross per 24 hour  Intake 240 ml  Output --  Net 240 ml        Physical Exam: Vital Signs Blood pressure 122/66, pulse 75, temperature 98 F (36.7 C), temperature source Oral, resp. rate 20, height 5' 10"  (1.778 m), weight 75.3 kg, SpO2 98 %.  General: No acute distress Mood and affect are appropriate Heart: Regular rate and rhythm no rubs murmurs or extra sounds Lungs: Clear to auscultation, breathing unlabored, no rales or wheezes Abdomen: Positive bowel sounds, soft nontender to palpation, nondistended Extremities: No clubbing, cyanosis, or edema Skin: No evidence of breakdown, no evidence of rash  Motor: RUE/RLE: 5/5 proximal distal LUE: Shoulder abduction, elbow flexion/extension to -/5, handgrip 0/5 LLE: 4-/5 proximal distal  Assessment/Plan: 1. Functional deficits which require 3+ hours per day of interdisciplinary therapy in a comprehensive inpatient rehab setting.  Physiatrist is providing close team supervision and 24 hour management of active medical problems listed below.  Physiatrist and rehab team continue to assess barriers to discharge/monitor patient progress toward functional and medical goals  Care Tool:  Bathing    Body parts bathed by patient: Chest,Abdomen,Front perineal area,Right upper leg,Left upper leg,Face,Left arm   Body parts bathed by helper: Right arm,Buttocks,Left lower leg,Right lower  leg     Bathing assist Assist Level: Moderate Assistance - Patient 50 - 74%     Upper Body Dressing/Undressing Upper body dressing   What is the patient wearing?: Pull over shirt    Upper body assist Assist Level: Supervision/Verbal cueing    Lower Body Dressing/Undressing Lower body dressing      What is the patient wearing?: Pants     Lower body assist Assist for lower body dressing: Minimal Assistance - Patient > 75%     Toileting Toileting    Toileting assist Assist for toileting: Supervision/Verbal cueing     Transfers Chair/bed transfer  Transfers assist     Chair/bed transfer assist level: Contact Guard/Touching assist     Locomotion Ambulation   Ambulation assist      Assist level: Contact Guard/Touching assist Assistive device: Cane-straight Max distance: 90   Walk 10 feet activity   Assist     Assist level: Contact Guard/Touching assist Assistive device: Cane-straight   Walk 50 feet activity   Assist    Assist level: Contact Guard/Touching assist Assistive device: Cane-straight    Walk 150 feet activity   Assist Walk 150 feet activity did not occur: Safety/medical concerns         Walk 10 feet on uneven surface  activity   Assist Walk 10 feet on uneven surfaces activity did not  occur: Safety/medical concerns         Wheelchair     Assist Will patient use wheelchair at discharge?: No Type of Wheelchair: Manual    Wheelchair assist level: Total Assistance - Patient < 25% Max wheelchair distance: 150    Wheelchair 50 feet with 2 turns activity    Assist        Assist Level: Total Assistance - Patient < 25%   Wheelchair 150 feet activity     Assist      Assist Level: Total Assistance - Patient < 25%   Blood pressure 122/66, pulse 75, temperature 98 F (36.7 C), temperature source Oral, resp. rate 20, height 5' 10"  (1.778 m), weight 75.3 kg, SpO2 98 %.  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 Recommendations of 30-day event monitor as outpatient.  Continue CIR PT, OT, SLP  Informed family that D/C date can be moved to 5/26 Team conference today please see physician documentation under team conference tab, met with team  to discuss problems,progress, and goals. Formulized individual treatment plan based on medical history, underlying problem and comorbidities.  2. Antithrombotics: -DVT/anticoagulation:Subcutaneous heparin- cont to monitor plt would hold if signs of bleeding or PLT <50K  -antiplatelet therapy: Aspirin 81 mg daily and Brilinta 60 mg twice daily 3. Pain Management:Tylenol as needed 4. Mood:Provide emotional support -antipsychotic agents: N/A 5. Neuropsych: This patientis?  Fully capable of making decisions on hisown behalf. 6. Skin/Wound Care:Routine skin checks.  7. Fluids/Electrolytes/Nutrition:Routine in and outs 8.  Blood pressure Vitals:   02/27/21 2013 02/28/21 0429  BP: 123/69 122/66  Pulse: 86 75  Resp: 20 20  Temp: 97.9 F (36.6 C) 98 F (36.7 C)  SpO2: 97% 98%    controlled on 5/25 9. Post stroke dysphagia. Cortrak out-Dysphagia #2 nectar thick liquids. Follow-up speech therapy 10.  Hyperlipidemia: Lipitor 11. Urinary retention. Discontinued Foley tube. Continue Flomax 0.4 mg daily. 12. Acute hypoxic respiratory failure due to COVID-19. Patient did receive steroids/remdesivir/Actermaand remained on isolation through 02/14/2021 and discontinued per infectious disease. Was not vaccinated 69. Constipation-  Improved  14.  Pancytopenia   5/16: appreciate pharm consult- they think actemra or Cefepime are most like the cause- he is now off both.   Improved 5/23 15. 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  Dry eyes order saline drops LOS: 13 days A FACE TO Cameron E Kahmari Koller 02/28/2021, 8:19 AM

## 2021-02-28 NOTE — Plan of Care (Signed)
  Problem: RH Balance Goal: LTG Patient will maintain dynamic sitting balance (PT) Description: LTG:  Patient will maintain dynamic sitting balance with assistance during mobility activities (PT) Outcome: Completed/Met Goal: LTG Patient will maintain dynamic standing balance (PT) Description: LTG:  Patient will maintain dynamic standing balance with assistance during mobility activities (PT) Outcome: Completed/Met   Problem: RH Bed Mobility Goal: LTG Patient will perform bed mobility with assist (PT) Description: LTG: Patient will perform bed mobility with assistance, with/without cues (PT). Outcome: Completed/Met   Problem: RH Bed to Chair Transfers Goal: LTG Patient will perform bed/chair transfers w/assist (PT) Description: LTG: Patient will perform bed to chair transfers with assistance (PT). Outcome: Completed/Met   Problem: RH Car Transfers Goal: LTG Patient will perform car transfers with assist (PT) Description: LTG: Patient will perform car transfers with assistance (PT). Outcome: Completed/Met   Problem: RH Ambulation Goal: LTG Patient will ambulate in controlled environment (PT) Description: LTG: Patient will ambulate in a controlled environment, # of feet with assistance (PT). Outcome: Completed/Met Goal: LTG Patient will ambulate in home environment (PT) Description: LTG: Patient will ambulate in home environment, # of feet with assistance (PT). Outcome: Completed/Met   Problem: RH Wheelchair Mobility Goal: LTG Patient will propel w/c in controlled environment (PT) Description: LTG: Patient will propel wheelchair in controlled environment, # of feet with assist (PT) Outcome: Completed/Met   Problem: RH Stairs Goal: LTG Patient will ambulate up and down stairs w/assist (PT) Description: LTG: Patient will ambulate up and down # of stairs with assistance (PT) Outcome: Completed/Met   

## 2021-02-28 NOTE — Progress Notes (Signed)
Patient ID: Leonard [name], male   DOB: 1956/04/27, 65 y.o.   MRN: [id number] Team Conference Report to Patient/Family  Team Conference discussion was reviewed with the patient and caregiver, including goals, any changes in plan of care and target discharge date.  Patient and caregiver express understanding and are in agreement.  The patient has a target discharge date of 03/01/21.  Sw met with pt and called spouse at bedside. Informed of pt being medically stable for d/c. Spouse reports her and son work [employer], pt will require a late d/c due to transportation.   Dyanne Iha 02/28/2021, 1:19 PM

## 2021-02-28 NOTE — Discharge Summary (Signed)
Physician Discharge Summary  Patient ID: Courtenay Creger Eggebrecht MRN: [id number] DOB/AGE: [birth year] 65 y.o.  Admit date: 02/15/2021 Discharge date: 03/01/2021  Discharge Diagnoses:  Principal Problem:   Right middle cerebral artery stroke [facility]) Active Problems:   Xerostomia   Anemia   Hemiparesis 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 (HCC) DVT prophylaxis Hyperlipidemia BPH/urinary retention Acute hypoxic respiratory failure due to COVID-19  Discharged Condition: Stable  Significant Diagnostic Studies: CT ABDOMEN WO CONTRAST  Result Date: 02/10/2021 CLINICAL DATA:  Assess anatomy for possible gastrostomy tube placement. History of cerebrovascular accident. EXAM: CT ABDOMEN WITHOUT CONTRAST TECHNIQUE: Multidetector CT imaging of the abdomen was performed following the standard protocol without IV contrast. COMPARISON:  None. FINDINGS: Lower chest: Nonspecific patchy ground-glass attenuation airspace opacities in the visualized lower lungs bilaterally. Visualized cardiac structures are normal in size. No pericardial effusion. Unremarkable distal thoracic esophagus conveying a feeding tube. Hepatobiliary: No focal liver abnormality is seen. No gallstones, gallbladder wall thickening, or biliary dilatation. Pancreas: Unremarkable. No pancreatic ductal dilatation or surrounding inflammatory changes. Spleen: Normal in size without focal abnormality. Adrenals/Urinary Tract: Adrenal glands are unremarkable. Kidneys are normal, without renal calculi, focal lesion, or hydronephrosis. Stomach/Bowel: Normal gastric anatomy. Transpyloric feeding tube tip is present in the proximal duodenum. No significant colonic interposition anterior to the stomach. Vascular/Lymphatic: Mild atherosclerotic calcification within the abdominal aorta. No suspicious lymphadenopathy. Other: No abdominal wall hernia or abnormality. Musculoskeletal: No acute fracture or aggressive appearing lytic or blastic osseous lesion.  IMPRESSION: Anatomy is suitable for percutaneous gastrostomy tube placement. Electronically Signed   By: Malachy Moan M.D.   On: 02/10/2021 08:56   CT HEAD WO CONTRAST  Result Date: 02/07/2021 CLINICAL DATA:  History of right MCA infarct. EXAM: CT HEAD WITHOUT CONTRAST TECHNIQUE: Contiguous axial images were obtained from the base of the skull through the vertex without intravenous contrast. COMPARISON:  Head CT scan 02/04/2021 and 01/27/2021. FINDINGS: Brain: Hypoattenuation in the right MCA territories consistent evolution of the patient's known infarct. No hemorrhagic transformation. No new infarct, midline shift, hydrocephalus, mass or abnormal extra-axial fluid collection. Vascular: No hyperdense vessel or unexpected calcification. Skull: Intact.  No focal lesion. Sinuses/Orbits: NG tube is noted. Mild mucosal thickening sphenoid sinuses seen scratch the mild mucosal thickening left sphenoid sinus. Other: None. IMPRESSION: No acute intracranial abnormality. Continued evolution of the patient's known right MCA territory infarct. Electronically Signed   By: Drusilla Kanner M.D.   On: 02/07/2021 14:49   CT HEAD WO CONTRAST  Result Date: 02/04/2021 CLINICAL DATA:  Follow-up stroke. Right MCA territory stroke in late April. EXAM: CT HEAD WITHOUT CONTRAST TECHNIQUE: Contiguous axial images were obtained from the base of the skull through the vertex without intravenous contrast. COMPARISON:  01/27/2021 FINDINGS: Brain: No abnormality affects the brainstem or cerebellum. Left cerebral hemisphere again appears normal. Subacute infarction in the right MCA territory affecting the insula and frontal operculum. Less swelling. Small areas of hemorrhage within the stroke are becoming less dense. No evidence of new or increasing hemorrhage. Right-to-left shift now only 1 or 2 mm. No new ischemic insult. No hydrocephalus or extra-axial collection. Vascular: No abnormal vascular finding. Skull: Normal Sinuses/Orbits:  Clear/normal. Other: None IMPRESSION: Expected evolutionary changes of infarction in the right middle cerebral artery territory which was acute in a parole. Low-density persists but with diminished swelling. Areas of hemorrhage within the substance of the infarction are becoming less dense. No new or increasing hemorrhage. Diminishing right to left midline shift, only about 2 mm presently.  Electronically Signed   By: Paulina Fusi M.D.   On: 02/04/2021 16:01   DG CHEST PORT 1 VIEW  Result Date: 02/03/2021 CLINICAL DATA:  Tachypnea EXAM: PORTABLE CHEST 1 VIEW COMPARISON:  Radiograph 02/01/2021 FINDINGS: Vaginal a transesophageal feeding tube tip terminates near the gastric antrum/duodenal bulb. Telemetry leads overlie the chest. Nasal cannula overlies the upper chest. Continued increase in the patchy, heterogeneous bilateral pulmonary opacities most coalescent in the right upper lobe though increasingly dense throughout the left hemithorax as well. Stable cardiomediastinal contours accounting for differences in technique. No pneumothorax or visible layering effusion. No acute osseous or soft tissue abnormality. Degenerative changes are present in the imaged spine and shoulders. IMPRESSION: Increasing bilateral opacities, particularly in the right upper lobe and throughout the left hemithorax. Could reflect worsening multifocal infection and/or edema. Stable cardiomegaly. Feeding tube tip near the gastric antrum/duodenal bulb. Electronically Signed   By: Kreg Shropshire M.D.   On: 02/03/2021 06:00   DG CHEST PORT 1 VIEW  Result Date: 02/01/2021 CLINICAL DATA:  Fever. EXAM: PORTABLE CHEST 1 VIEW COMPARISON:  Chest x-ray 01/29/2021, 01/25/2021. FINDINGS: Feeding tube noted with tip below left hemidiaphragm. Cardiomegaly. Progressive bilateral pulmonary infiltrates/edema, particularly in the right upper lung. Multifocal pneumonia and or pulmonary edema could present in this fashion. No pleural effusion or  pneumothorax. Degenerative change thoracic spine. IMPRESSION: Feeding tube noted with tip below left hemidiaphragm. 2.  Cardiomegaly. 3. Progressive bilateral pulmonary infiltrates/edema, particularly in the right upper lung. Multifocal pneumonia and or pulmonary edema could present this fashion. Electronically Signed   By: Maisie Fus  Register   On: 02/01/2021 15:19   DG Chest Port 1 View  Result Date: 01/29/2021 CLINICAL DATA:  Encounter for compromised airway. EXAM: PORTABLE CHEST 1 VIEW COMPARISON:  01/27/2021. FINDINGS: Feeding tube is followed into the stomach with the tip projecting beyond the inferior margin of the image. Trachea is midline. Heart is enlarged, stable. Lungs are low in volume with increasing mixed interstitial and airspace opacification, worst in the right upper lobe. No definite pleural fluid. IMPRESSION: Low lung volumes with increasing mixed interstitial and airspace opacification, findings which Saari be due to edema, pneumonia and/or aspiration. Electronically Signed   By: Leanna Battles M.D.   On: 01/29/2021 08:37   DG Swallowing Func-Speech Pathology  Result Date: 02/14/2021 Objective Swallowing Evaluation: Type of Study: MBS-Modified Barium Swallow Study  Patient Details Name: Rydell Wiegel Santor MRN: [id number] Date of Birth: 09-04-1956 Today's Date: 02/14/2021 Time: SLP Start Time (ACUTE ONLY): 1354 -SLP Stop Time (ACUTE ONLY): 1418 SLP Time Calculation (min) (ACUTE ONLY): 24.88 min Past Medical History: No past medical history on file. Past Surgical History: Past Surgical History: Procedure Laterality Date . IR ANGIO INTRA EXTRACRAN SEL COM CAROTID INNOMINATE UNI L MOD SED  01/25/2021 . IR CT HEAD LTD  01/25/2021 . IR CT HEAD LTD  01/25/2021 . IR PERCUTANEOUS ART THROMBECTOMY/INFUSION INTRACRANIAL INC DIAG ANGIO  01/25/2021 . RADIOLOGY WITH ANESTHESIA N/A 01/24/2021  Procedure: IR WITH ANESTHESIA;  Surgeon: Julieanne Cotton, MD;  Location: [facility] OR;  Service: Radiology;  Laterality: N/A; HPI:  Pt is a 65 y.o. male with no significant PMHx who presented via EMS as a code stroke for acute onset of left hemiplegia, left facial droop, dysarthria and left hemineglect. NIHSS was 18 on admission and tPA was given. 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 Pt s/p  s/p bilateral common carotid arteriograms followed by right MCA/ACA/ICA  thrombus retrieval with stent angioplasty 4/21. Pt was intubated for procedure. CT head 4/22: Large area of hypoattenuation at the site of known right MCA territory infarct. Small focus of parenchymal hyperdensity at the posterior aspect of the infarct site [place] indicate a small amount of petechial hemorrhage or contrast extravasation from procedure. Small amount of subarachnoid hyperdensity over the right frontal convexity, either subarachnoid blood or contrast taining. MBS 4/26: a moderate oral and moderate to severe pharyngeal dysphagia with sensory and motor deficits of neurogenic etiology.  Subjective: -- (alert) Assessment / Plan / Recommendation CHL IP CLINICAL IMPRESSIONS 02/14/2021 Clinical Impression Pt presents with improved oropharyngeal function compared to that noted during the last MBS on 01/30/21. Pt demonstrated oropharyngeal dysphagia characterized by reduced labial seal and stripping, weak manipulation, impaired bolus propulsion, reduced bolus cohesion, a pharyngeal delay, premature spillage to the valleculae with intermittent spillover to the pyriform sinuses, reduced anterior laryngeal movement, and reduced pharyngeal constriction. He demonstrated anterior spillage to the left, residue in the left lateral sulcus, difficulty with A-P transport, vallecular residue, pyriform sinuse residue, and posterior pharyngeal wall residue. He inermittently demonstrated incomplete epiglottic inversion which facilitated penetration (PAS 3) and occasional aspiration (PAS 7) of thin liquids and once with  a dual consistency bolus (i.e., nectar thick liquids with masticated regular textures). Pt's cough was inconsistently delayed and variability in effectiveness was also noted. No functional benefit was noted with pt's independent use of a posterior head tilt to facilitate A-P transport of solids. Regular textures, a barium tablet, and dysphagia 3 solids were ultimately removed from the oral cavity after multiple unsuccessful attempts at A-P transport. Amount of pharyngeal residue increased with advancement of solids, but was improved with a liquid wash and independent secondary swallows. A dysphagia 2 diet with nectar thick liquids will be initiated at this time. SLP is hopeful that G-tube placement can be avoided if p.o. intake is tolerated and quantity is adequate. Pt's results and recommendations were discussed with Dr. Jerral Ralph and Ralene Muskrat, [facility]; it was agreed that pt's Cortrak will be removed, and that G-tube placement will reassessed on Monday. SLP will continue to follow pt for treatment. SLP Visit Diagnosis Dysphagia, oropharyngeal phase (R13.12) Attention and concentration deficit following -- Frontal lobe and executive function deficit following -- Impact on safety and function --   CHL IP TREATMENT RECOMMENDATION 02/14/2021 Treatment Recommendations Therapy as outlined in treatment plan below   Prognosis 02/14/2021 Prognosis for Safe Diet Advancement Good Barriers to Reach Goals Cognitive deficits;Severity of deficits Barriers/Prognosis Comment -- CHL IP DIET RECOMMENDATION 02/14/2021 SLP Diet Recommendations Dysphagia 2 (Fine chop) solids;Nectar thick liquid Liquid Administration via Cup;No straw Medication Administration Crushed with puree Compensations Slow rate;Small sips/bites;Multiple dry swallows after each bite/sip;Follow solids with liquid Postural Changes Seated upright at 90 degrees   CHL IP OTHER RECOMMENDATIONS 02/14/2021 Recommended Consults -- Oral Care Recommendations Oral care BID Other  Recommendations Order thickener from pharmacy   CHL IP FOLLOW UP RECOMMENDATIONS 02/14/2021 Follow up Recommendations Inpatient Rehab   CHL IP FREQUENCY AND DURATION 02/14/2021 Speech Therapy Frequency (ACUTE ONLY) min 2x/week Treatment Duration 2 weeks      CHL IP ORAL PHASE 02/14/2021 Oral Phase Impaired Oral - Pudding Teaspoon -- Oral - Pudding Cup -- Oral - Honey Teaspoon -- Oral - Honey Cup Weak lingual manipulation;Lingual/palatal residue;Decreased bolus cohesion;Premature spillage;Reduced posterior propulsion Oral - Nectar Teaspoon -- Oral - Nectar Cup Weak lingual manipulation;Lingual/palatal residue;Decreased bolus cohesion;Premature spillage;Reduced posterior propulsion;Left anterior bolus loss Oral - Nectar Straw Weak lingual  manipulation;Lingual/palatal residue;Decreased bolus cohesion;Premature spillage;Reduced posterior propulsion;Left anterior bolus loss Oral - Thin Teaspoon -- Oral - Thin Cup Weak lingual manipulation;Lingual/palatal residue;Decreased bolus cohesion;Premature spillage;Reduced posterior propulsion;Left anterior bolus loss Oral - Thin Straw Weak lingual manipulation;Lingual/palatal residue;Decreased bolus cohesion;Premature spillage;Reduced posterior propulsion;Left anterior bolus loss Oral - Puree Weak lingual manipulation;Lingual/palatal residue;Decreased bolus cohesion;Reduced posterior propulsion;Left anterior bolus loss Oral - Mech Soft Weak lingual manipulation;Lingual/palatal residue;Reduced posterior propulsion Oral - Regular Weak lingual manipulation;Lingual/palatal residue;Reduced posterior propulsion Oral - Multi-Consistency -- Oral - Pill Weak lingual manipulation;Lingual/palatal residue;Reduced posterior propulsion Oral Phase - Comment --  CHL IP PHARYNGEAL PHASE 02/14/2021 Pharyngeal Phase Impaired Pharyngeal- Pudding Teaspoon -- Pharyngeal -- Pharyngeal- Pudding Cup -- Pharyngeal -- Pharyngeal- Honey Teaspoon -- Pharyngeal -- Pharyngeal- Honey Cup Reduced epiglottic  inversion;Reduced anterior laryngeal mobility;Pharyngeal residue - valleculae;Pharyngeal residue - pyriform Pharyngeal -- Pharyngeal- Nectar Teaspoon -- Pharyngeal -- Pharyngeal- Nectar Cup Reduced epiglottic inversion;Reduced anterior laryngeal mobility;Pharyngeal residue - valleculae;Pharyngeal residue - pyriform;Penetration/Aspiration during swallow Pharyngeal Material enters airway, remains ABOVE vocal cords then ejected out Pharyngeal- Nectar Straw Reduced epiglottic inversion;Reduced anterior laryngeal mobility;Pharyngeal residue - valleculae;Pharyngeal residue - pyriform;Penetration/Aspiration during swallow Pharyngeal Material enters airway, remains ABOVE vocal cords and not ejected out;Material enters airway, passes BELOW cords and not ejected out despite cough attempt by patient Pharyngeal- Thin Teaspoon -- Pharyngeal -- Pharyngeal- Thin Cup Reduced epiglottic inversion;Reduced anterior laryngeal mobility;Pharyngeal residue - valleculae;Pharyngeal residue - pyriform;Penetration/Aspiration during swallow Pharyngeal Material enters airway, passes BELOW cords and not ejected out despite cough attempt by patient Pharyngeal- Thin Straw Reduced epiglottic inversion;Reduced anterior laryngeal mobility;Pharyngeal residue - valleculae;Pharyngeal residue - pyriform;Penetration/Aspiration during swallow Pharyngeal Material enters airway, passes BELOW cords and not ejected out despite cough attempt by patient Pharyngeal- Puree Reduced epiglottic inversion;Reduced anterior laryngeal mobility;Pharyngeal residue - valleculae;Pharyngeal residue - pyriform Pharyngeal -- Pharyngeal- Mechanical Soft -- Pharyngeal -- Pharyngeal- Regular -- Pharyngeal -- Pharyngeal- Multi-consistency -- Pharyngeal -- Pharyngeal- Pill -- Pharyngeal -- Pharyngeal Comment --  CHL IP CERVICAL ESOPHAGEAL PHASE 02/14/2021 Cervical Esophageal Phase WFL Pudding Teaspoon -- Pudding Cup -- Honey Teaspoon -- Honey Cup WFL Nectar Teaspoon -- Nectar Cup --  Nectar Straw -- Thin Teaspoon -- Thin Cup -- Thin Straw -- Puree -- Mechanical Soft -- Regular -- Multi-consistency -- Pill -- Cervical Esophageal Comment -- Scheryl Marten 02/14/2021, 3:47 PM              DG Swallowing Func-Speech Pathology  Result Date: 01/30/2021 Objective Swallowing Evaluation: Type of Study: Bedside Swallow Evaluation  Patient Details Name: Burlie Cajamarca Regan MRN: [id number] Date of Birth: 02/13/1956 Today's Date: 01/30/2021 Time: SLP Start Time (ACUTE ONLY): 1205 -SLP Stop Time (ACUTE ONLY): 1215 SLP Time Calculation (min) (ACUTE ONLY): 10 min Past Medical History: No past medical history on file. Past Surgical History: Past Surgical History: Procedure Laterality Date . IR ANGIO INTRA EXTRACRAN SEL COM CAROTID INNOMINATE UNI L MOD SED  01/25/2021 . IR CT HEAD LTD  01/25/2021 . IR CT HEAD LTD  01/25/2021 . IR PERCUTANEOUS ART THROMBECTOMY/INFUSION INTRACRANIAL INC DIAG ANGIO  01/25/2021 . RADIOLOGY WITH ANESTHESIA N/A 01/24/2021  Procedure: IR WITH ANESTHESIA;  Surgeon: Julieanne Cotton, MD;  Location: [facility] OR;  Service: Radiology;  Laterality: N/A; HPI: Pt is a 65 y.o. male with no significant PMHx who presented via EMS as a code stroke for acute onset of left hemiplegia, left facial droop, dysarthria and left hemineglect. NIHSS was 18 on admission and tPA was given. MRI brain 4/21: Large acute infarct of the right MCA territory involving the  frontal operculum, insula and basal ganglia. Pt s/p  s/p bilateral common carotid arteriograms followed by right MCA/ACA/ICA thrombus retrieval with stent angioplasty 4/21. Pt was intubated for procedure. CT head 4/22: Large area of hypoattenuation at the site of known right MCA territory infarct. Small focus of parenchymal hyperdensity at the posterior aspect of the infarct site [place] indicate a small amount of petechial hemorrhage or contrast extravasation from procedure. Small amount of subarachnoid hyperdensity over the right frontal convexity, either  subarachnoid blood or contrast taining.  Subjective: alert with cueing, pleasant Assessment / Plan / Recommendation CHL IP CLINICAL IMPRESSIONS 01/30/2021 Clinical Impression Pt presents with a moderate oral and moderate to severe pharyngeal dysphagia with sensory and motor deficits of neurogenic etiology. Oral deficit impairments included reduced left facial, labial, and lingual strength and ROM with decreased sensation. This allowed for reduced left sided anterior spillage, reduced bolus cohesion, and oral residuals. Pharyngeal deficits c/b reduced timing and efficiency of laryngeal vestibule closure, incomplete epiglottic deflection, reduced pharyngeal constriction, decreased base of tongue retraction, diminished sensation, and decreased UES distention. This resulted in pre, during, and post swallow penetration of all consistencies trialed to the level of the true vocal cords (puree, nectar thick, and honey thick liquids). Pt with incosistent sensation of reduced airway protection events. Cues for cough and or throat clear aided in expelling penetrates. Moderate valleuclar and pyriform sinsus residuals noted with thicker POs. Pt very high aspiration risk for POs across a meal given deficits noted this date. Recommend continue short term alternative nutrition and NPO with ice chips following diligent oral care and intensive swallowing therapy. SLP to follow up. SLP Visit Diagnosis Dysphagia, oropharyngeal phase (R13.12) Attention and concentration deficit following -- Frontal lobe and executive function deficit following -- Impact on safety and function Moderate aspiration risk;Severe aspiration risk   CHL IP TREATMENT RECOMMENDATION 01/30/2021 Treatment Recommendations Therapy as outlined in treatment plan below   Prognosis 01/30/2021 Prognosis for Safe Diet Advancement Good Barriers to Reach Goals Severity of deficits;Time post onset Barriers/Prognosis Comment -- CHL IP DIET RECOMMENDATION 01/30/2021 SLP Diet  Recommendations NPO Liquid Administration via -- Medication Administration Via alternative means Compensations -- Postural Changes --   CHL IP OTHER RECOMMENDATIONS 01/30/2021 Recommended Consults -- Oral Care Recommendations Oral care QID Other Recommendations --   CHL IP FOLLOW UP RECOMMENDATIONS 01/30/2021 Follow up Recommendations Inpatient Rehab   CHL IP FREQUENCY AND DURATION 01/30/2021 Speech Therapy Frequency (ACUTE ONLY) min 3x week Treatment Duration 2 weeks      CHL IP ORAL PHASE 01/30/2021 Oral Phase Impaired Oral - Pudding Teaspoon -- Oral - Pudding Cup -- Oral - Honey Teaspoon -- Oral - Honey Cup Weak lingual manipulation;Reduced posterior propulsion;Left pocketing in lateral sulci;Lingual/palatal residue;Decreased bolus cohesion;Delayed oral transit Oral - Nectar Teaspoon -- Oral - Nectar Cup Weak lingual manipulation;Reduced posterior propulsion;Lingual/palatal residue;Delayed oral transit;Decreased bolus cohesion Oral - Nectar Straw -- Oral - Thin Teaspoon -- Oral - Thin Cup -- Oral - Thin Straw -- Oral - Puree Weak lingual manipulation;Reduced posterior propulsion;Delayed oral transit;Decreased bolus cohesion;Lingual/palatal residue Oral - Mech Soft -- Oral - Regular -- Oral - Multi-Consistency -- Oral - Pill -- Oral Phase - Comment --  CHL IP PHARYNGEAL PHASE 01/30/2021 Pharyngeal Phase Impaired Pharyngeal- Pudding Teaspoon -- Pharyngeal -- Pharyngeal- Pudding Cup -- Pharyngeal -- Pharyngeal- Honey Teaspoon -- Pharyngeal -- Pharyngeal- Honey Cup Reduced epiglottic inversion;Reduced pharyngeal peristalsis;Reduced anterior laryngeal mobility;Reduced laryngeal elevation;Reduced airway/laryngeal closure;Reduced tongue base retraction;Penetration/Aspiration before swallow;Penetration/Aspiration during swallow;Pharyngeal residue - valleculae;Pharyngeal residue - pyriform  Pharyngeal Material enters airway, remains ABOVE vocal cords and not ejected out;Material enters airway, CONTACTS cords and not ejected out  Pharyngeal- Nectar Teaspoon -- Pharyngeal -- Pharyngeal- Nectar Cup Reduced epiglottic inversion;Reduced anterior laryngeal mobility;Reduced laryngeal elevation;Reduced airway/laryngeal closure;Reduced tongue base retraction;Penetration/Aspiration before swallow;Penetration/Aspiration during swallow;Pharyngeal residue - valleculae;Pharyngeal residue - pyriform;Reduced pharyngeal peristalsis Pharyngeal Material enters airway, remains ABOVE vocal cords and not ejected out;Material enters airway, CONTACTS cords and not ejected out Pharyngeal- Nectar Straw -- Pharyngeal -- Pharyngeal- Thin Teaspoon -- Pharyngeal -- Pharyngeal- Thin Cup -- Pharyngeal -- Pharyngeal- Thin Straw -- Pharyngeal -- Pharyngeal- Puree Delayed swallow initiation-vallecula;Reduced pharyngeal peristalsis;Reduced epiglottic inversion;Reduced anterior laryngeal mobility;Reduced laryngeal elevation;Reduced airway/laryngeal closure;Reduced tongue base retraction;Penetration/Aspiration before swallow;Penetration/Aspiration during swallow;Pharyngeal residue - pyriform;Pharyngeal residue - valleculae Pharyngeal Material enters airway, remains ABOVE vocal cords and not ejected out;Material enters airway, CONTACTS cords and not ejected out Pharyngeal- Mechanical Soft -- Pharyngeal -- Pharyngeal- Regular -- Pharyngeal -- Pharyngeal- Multi-consistency -- Pharyngeal -- Pharyngeal- Pill -- Pharyngeal -- Pharyngeal Comment --  CHL IP CERVICAL ESOPHAGEAL PHASE 01/30/2021 Cervical Esophageal Phase Impaired Pudding Teaspoon -- Pudding Cup -- Honey Teaspoon -- Honey Cup Reduced cricopharyngeal relaxation Nectar Teaspoon -- Nectar Cup Reduced cricopharyngeal relaxation Nectar Straw -- Thin Teaspoon -- Thin Cup -- Thin Straw -- Puree Reduced cricopharyngeal relaxation Mechanical Soft -- Regular -- Multi-consistency -- Pill -- Cervical Esophageal Comment -- Chelsea E Hartness MA, CCC-SLP 01/30/2021, 1:55 PM              EEG adult  Result Date: 02/07/2021 Charlsie Quest, MD     02/07/2021  5:59 PM Patient Name: Shun Pletz Doucet MRN: [id number] Epilepsy Attending: Charlsie Quest Referring Physician/Provider: Dr Huey Bienenstock Date: 02/07/2021 Duration: 23.14 mins Patient history: 65yo M with right MCA infarct and ams. EEG to evaluate for seizure Level of alertness: Awake AEDs during EEG study: None Technical aspects: This EEG study was done with scalp electrodes positioned according to the 10-20 International system of electrode placement. Electrical activity was acquired at a sampling rate of  and reviewed with a high frequency filter of  and a low frequency filter of . EEG data were recorded continuously and digitally stored. Description: The posterior dominant rhythm consists of 8 Hz activity of moderate voltage (25-35 uV) seen predominantly in posterior head regions, asymmetric( R<L) and reactive to eye opening and eye closing. EEG showed continuous generalized and lateralized right hemisphere 3 to 6 Hz theta-delta slowing.  Hyperventilation and photic stimulation were not performed.   ABNORMALITY - Continuous slow, generalized and lateralized right hemisphere IMPRESSION: This study is suggestive of cortical dysfunction arising from right hemisphere likely secondary to underlying structural abnormality/stroke. Additionally, there is mild diffuse encephalopathy, nonspecific etiology. No seizures or epileptiform discharges were seen throughout the recording. Charlsie Quest   EEG adult  Result Date: 01/31/2021 Charlsie Quest, MD     01/31/2021  6:52 PM Patient Name: Joah Patlan Bennick MRN: [id number] Epilepsy Attending: Charlsie Quest Referring Physician/Provider: Dr Delia Heady Date: 01/31/2021 Duration: 23.32 mins Patient history: 65yo M with R MCA infarct, with ams. EEG to evaluate for seizure Level of alertness: Awake AEDs during EEG study: None Technical aspects: This EEG study was done with scalp electrodes positioned according to the 10-20 International  system of electrode placement. Electrical activity was acquired at a sampling rate of  and reviewed with a high frequency filter of  and a low frequency filter of . EEG data were recorded continuously and digitally stored. Description: No  clear posterior dominant rhythm was seen. EEG showed continuous generalized polymorphic mixed frequencies with predominantly to 6-9hz  Hz theta activity admixed with 2-3hz  delta slowing. Additionally there is continuous 3-5hz  theta-delta slowing in right temporal region. Hyperventilation and photic stimulation were not performed.   ABNORMALITY - Continuous slow, generalized and maximal right temporal region IMPRESSION: This study is suggestive of cortical dysfunction arising from right temporal region, likely secondary to underlying structural abnormality as well as mild to moderate diffuse encephalopathy, nonspecific etiology. No seizures or epileptiform discharges were seen throughout the recording. Priyanka O Yadav    Labs:  Basic Metabolic Panel: No results for input(s): NA, K, CL, CO2, GLUCOSE, BUN, CREATININE, CALCIUM, MG, PHOS in the last 168 hours.  CBC: Recent Labs  Lab 02/21/21 0845 02/23/21 0619 02/26/21 1207  WBC 2.5* 2.7* 4.3  NEUTROABS  --  1.1* 2.2  HGB 12.2* 11.3* 12.7*  HCT 38.4* 35.0* 39.3  MCV 94.8 94.3 94.5  PLT 151 125* 137*    CBG: No results for input(s): GLUCAP in the last 168 hours.  Family history.  Positive for hypertension as well as hyperlipidemia.  Denies any colon cancer esophageal cancer or rectal cancer  Brief HPI:   Arwin Bisceglia Freyre is a 65 y.o. right-handed male with unremarkable past medical history.  Lives with spouse independent prior to admission.  1 level home 4 steps to entry.  Presented 01/24/2021 with acute onset of left-sided weakness dysarthria and facial droop.  Noted blood pressure severely elevated to 32/124.  Cranial CT scan showed acute right MCA territory infarction without hemorrhagic or  mass-effect.  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  Patient underwent revascularization per interventional radiology.  Follow-up MRI 01/25/2021 showed a large acute infarction right MCA territory involving the frontal operculum insula and basal ganglia.  No hemorrhage or mass-effect.  Punctate foci of acute ischemia in the right temporal lobe and right thalamus.  MRA was unremarkable.  Echocardiogram with ejection fraction of 60 to 65% no wall motion abnormalities.  Currently maintained on aspirin for CVA prophylaxis as well as Brilinta.  Hospital course complicated by dysphagia initially n.p.o. diet slowly advanced dysphagia #2 nectar thick liquids.  Cleviprex for blood pressure control.  Hospital course complicated by acute hypoxic respiratory failure due to COVID-19 pneumonia possible aspiration.  Hypoxia resolved treated with steroids/remdesivir/Actemra and broad-spectrum antimicrobial therapy.  Noted he was unvaccinated.  Contact infectious disease no need for isolation from 02/14/2021 onwards.  He did experience some urinary retention with Foley tube inserted placed on Flomax.  Therapy evaluations completed due to patient's left-sided weakness dysarthria and dysphagia was admitted for a comprehensive rehab program.   Hospital Course: Bradon Fester Mensing was admitted to rehab 02/15/2021 for inpatient therapies to consist of PT, ST and OT at least three hours five days a week. Past admission physiatrist, therapy team and rehab RN have worked together to provide customized collaborative inpatient rehab.  Pertaining to patient's right MCA infarction with complete occlusion of right middle cerebral artery status post revascularization.  Patient would follow-up neurology services as well as interventional radiology.  Contact made in regards  to recommendations for 30-day cardiac event monitor as outpatient.  Initially on subcutaneous heparin for DVT prophylaxis hold if platelets less than 50,000.  Patient remained on aspirin and Brilinta for CVA prophylaxis.  Patient remained on Lipitor for hyperlipidemia.  Blood pressure currently controlled on no antihypertensive medications.  Initial bouts of urinary retention Foley tube removed maintained on Flomax voiding without difficulty.  Hospital course complicated by acute hypoxic respiratory failure COVID-19 that was treated isolation later discontinued per infectious disease patient remained afebrile oxygen saturations greater than 90% on room air.  Mild pancytopenia pharmacology consult felt to be related to possible actemra or cefepime as most likely cause and monitored continue to stabilize with latest hemoglobin 12.7 RBC 4.16 platelet improved 137,000.   Blood pressures were monitored on TID basis and controlled     Rehab course: During patient's stay in rehab weekly team conferences were held to monitor patient's progress, set goals and discuss barriers to discharge. At admission, patient required moderate assist stand pivot transfers minimal assist sit to supine moderate assist side-lying to sitting moderate assist eating mod assist upper body bathing moderate assist lower body bathing max a set body dressing max assist lower body dressing  Physical exam.  Blood pressure 129/70 pulse 93 temperature 97.7 respirations 18 oxygen saturations 97% room air Constitutional.  No acute distress HEENT Head.  Normocephalic and atraumatic Eyes.  Pupils round and reactive to light no discharge.nystagmus Neck.  Supple nontender no JVD without thyromegaly Cardiac regular rate rhythm no extra sounds or murmur heard Abdomen.  Soft nontender positive bowel sounds without rebound Respiratory effort normal no respiratory distress without wheeze Skin.  Warm and dry Musculoskeletal.  Right upper  extremity/right lower extremity 5/5 Left upper extremity biceps 1/5 triceps 3/5 wrist extension/grip and FA 0/5 Left lower extremity hip flexors 4/5 knee extension 4+/5 dorsiflexion and plantarflexion 4+/5 Neurologic.  Bit lethargic but arousable made eye contact with examiner follows commands speech was dysarthric.  He knows he is in the hospital provides name and hospital.  Significant left inattention and right gaze preference  He/She  has had improvement in activity tolerance, balance, postural control as well as ability to compensate for deficits. He/She has had improvement in functional use RUE/LUE  and RLE/LLE as well as improvement in awareness.  Patient directed in supine to sit minimal assist.  Patient directed in donning shoes edge of bed standby assist.  Ambulates straight point cane 300 feet standby assist.  Patient directed in standing step ups with straight point cane contact-guard assist.  Supine to sit supervision ambulates into the bathroom contact-guard assist.  Needs some assist due to left neglect.  Speech therapy follow-up for dysarthria as well as dysphagia.  SLP facilitated basic mildly complex problem-solving sustained attention recall and error awareness in a novel card task.  Full family teaching completed plan discharged to home       Disposition: Discharged to home    Diet: Dysphagia #2 thin liquids  Special Instructions: No driving smoking or alcohol  Medications at discharge 1.  Tylenol as needed 2.  Albuterol inhaler 2 puffs every 4 hours as needed wheezing 3.  Vitamin C 500 mg p.o. daily 4.  Aspirin 81 mg p.o. daily 5.  Lipitor 80 mg p.o. daily 6.  Polyvinyl alcohol ophthalmic solution 1 drop right eye as needed dry eyes 7.  Senokot S1 tablet p.o. daily 8.  Brilinta 90 mg p.o. twice daily 9.  Zinc sulfate 220 mg p.o. daily  30-35 minutes were spent completing discharge summary and discharge planning  Discharge Instructions    Ambulatory referral to  Neurology   Complete by: As directed    An appointment is requested in approximately 4  weeks right MCA infarction       Follow-up Information    Kirsteins, Victorino SparrowAndrew E, MD Follow up.   Specialty: Physical Medicine and Rehabilitation Why: Office to call for appointment Contact information: [address] [place] [address] [phone]        Gilmer MorWagner, Jaime, DO Follow up.   Specialties: Interventional Radiology, Radiology Why: Call for appointment Contact information: [address] [place] [address] [phone]               Signed: Mcarthur RossettiDaniel J Embree Brawley 02/28/2021, 5:10 AM

## 2021-02-28 NOTE — Progress Notes (Signed)
Patient ID: Leonard [name], male   DOB: June 03, 1956, 65 y.o.   MRN: [id number]   OP referral faxed to [facility]   [place], [place] [phone]

## 2021-02-28 NOTE — Plan of Care (Signed)
  Problem: RH Eating Goal: LTG Patient will perform eating w/assist, cues/equip (OT) Description: LTG: Patient will perform eating with assist, with/without cues using equipment (OT) Outcome: Not Met (add Reason) Flowsheets (Taken 02/28/2021 1451) LTG: Pt will perform eating with assistance level of: (Pt cont to req S to adhere to swallowing precautions + visually scan to L.) Supervision/Verbal cueing Note: Pt cont to req S to adhere to swallowing precautions + visually scan to L.   Problem: RH Functional Use of Upper Extremity Goal: LTG Patient will use RT/LT upper extremity as a (OT) Description: LTG: Patient will use right/left upper extremity as a stabilizer/gross assist/diminished/nondominant/dominant level with assist, with/without cues during functional activity (OT) Outcome: Not Met (add Reason) Flowsheets (Taken 02/28/2021 1451) LTG: Pt will use upper extremity in functional activity with assistance level of: (Pt cont to req mod to max A to incorporate LUE as stabilizer.) -- Note: Pt cont to req mod to max A to incorporate LUE as stabilizer.    Problem: RH Balance Goal: LTG Patient will maintain dynamic standing with ADLs (OT) Description: LTG:  Patient will maintain dynamic standing balance with assist during activities of daily living (OT)  Outcome: Completed/Met   Problem: Sit to Stand Goal: LTG:  Patient will perform sit to stand in prep for activites of daily living with assistance level (OT) Description: LTG:  Patient will perform sit to stand in prep for activites of daily living with assistance level (OT) Outcome: Completed/Met   Problem: RH Bathing Goal: LTG Patient will bathe all body parts with assist levels (OT) Description: LTG: Patient will bathe all body parts with assist levels (OT) Outcome: Completed/Met   Problem: RH Dressing Goal: LTG Patient will perform upper body dressing (OT) Description: LTG Patient will perform upper body dressing with assist,  with/without cues (OT). Outcome: Completed/Met Goal: LTG Patient will perform lower body dressing w/assist (OT) Description: LTG: Patient will perform lower body dressing with assist, with/without cues in positioning using equipment (OT) Outcome: Completed/Met   Problem: RH Toileting Goal: LTG Patient will perform toileting task (3/3 steps) with assistance level (OT) Description: LTG: Patient will perform toileting task (3/3 steps) with assistance level (OT)  Outcome: Completed/Met   Problem: RH Toilet Transfers Goal: LTG Patient will perform toilet transfers w/assist (OT) Description: LTG: Patient will perform toilet transfers with assist, with/without cues using equipment (OT) Outcome: Completed/Met   Problem: RH Tub/Shower Transfers Goal: LTG Patient will perform tub/shower transfers w/assist (OT) Description: LTG: Patient will perform tub/shower transfers with assist, with/without cues using equipment (OT) Outcome: Completed/Met

## 2021-02-28 NOTE — Patient Care Conference (Signed)
Inpatient RehabilitationTeam Conference and Plan of Care Update Date: 02/28/2021   Time: 10:27 AM    Patient Name: Leonard [name]      Medical Record Number: [id number]  Date of Birth: Oct 07, 1956 Sex: Male         Room/Bed: 4M12C/4M12C-01 Payor Info: Payor: [employer] MEDICARE / Plan: [place] HMO / Product Type: *No Product type* /    Admit Date/Time:  02/15/2021  1:59 PM  Primary Diagnosis:  Right middle cerebral artery stroke [facility])  Hospital Problems: Principal Problem:   Right middle cerebral artery stroke ([place]) Active Problems:   Xerostomia   Anemia   Hemiparesis 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 [facility])    Expected Discharge Date: Expected Discharge Date: 03/01/21  Team Members Present: Physician leading conference: Dr. Alysia Penna Care Coodinator Present: Dorien Chihuahua, RN, BSN, CRRN;Christina [place], [place] Nurse Present: Dorien Chihuahua, RN PT Present: Barrie Folk, PT OT Present: Other (comment) Providence Lanius, OT) SLP Present: Nadara Mode, SLP PPS Coordinator present : Gunnar Fusi, SLP     Current Status/Progress Goal Weekly Team Focus  Bowel/Bladder             Swallow/Nutrition/ Hydration             ADL's   S UBD, min A UBD, LBD (for socks/shoes), S toilet and walk-in shower transfer with SPC, Brunstrum level III, I in hand; family education went well  CGA transfers, min A UBD/LBD; met all goals  LUE NMR, L inattention, functional cognition, balance/self-care/transfer retraining, pt/family/AE/DME education   Mobility   supervision A for gait, transfers, and stair management. mod I to supevisoin assist for bed mobiltiy with cues for attention to the LUE.  supevisoin assist for safety with gait and mod I intermittently for transfers.  family education. safety awareness. dynamic balance and improved independence with home and community mobility.   Communication             Safety/Cognition/ Behavioral Observations            Pain              Skin               Discharge Planning:  pt to d/c MOD I   Team Discussion: Hand weakness and left neglect limiting progress along with poor awareness of deficits and requires cues for scanning left and prompts for initiation.  Patient on target to meet rehab goals: Currently on target for discharge; supervision for bathing and dressing.  *See Care Plan and progress notes for long and short-term goals.   Revisions to Treatment Plan:  Advanced to thin liquid diet  Teaching Needs: Family education completed with wife 02/27/21  Current Barriers to Discharge: Decreased caregiver support and Home enviroment access/layout  Possible Resolutions to Barriers: Follow up services     Medical Summary Current Status: Poor awareness of deficits, LUE weakness and left neglect , reduced balance  Barriers to Discharge: Other (comments)  Barriers to Discharge Comments: need to complete family training Possible Resolutions to Barriers/Weekly Focus: plan D/C in am   Continued Need for Acute Rehabilitation Level of Care: The patient requires daily medical management by a physician with specialized training in physical medicine and rehabilitation for the following reasons: Direction of a multidisciplinary physical rehabilitation program to maximize functional independence : Yes Medical management of patient stability for increased activity during participation in an intensive rehabilitation regime.: Yes Analysis of laboratory values and/or radiology reports with any subsequent  need for medication adjustment and/or medical intervention. : Yes   I attest that I was present, lead the team conference, and concur with the assessment and plan of the team.   Dorien Chihuahua B 02/28/2021, 1:44 PM

## 2021-03-01 LAB — GLUCOSE, CAPILLARY: Glucose-Capillary: 97 mg/dL (ref 70–99)

## 2021-03-01 NOTE — Progress Notes (Signed)
Inpatient Rehabilitation Care Coordinator Discharge Note  The overall goal for the admission was met for:   Discharge location: Yes, home   Length of Stay: Yes, 14 Days  Discharge activity level: Yes, ambulatory level Supervision  Home/community participation: Yes  Services provided included: MD, RD, PT, OT, SLP, RN, CM, TR, Pharmacy, Neuropsych and SW  Financial Services: Private Insurance: [employer]   Choices offered to/list presented to:pt and spouse  Follow-up services arranged: Outpatient: [place]  Comments (or additional information): PT OT  Transfer Bench  Patient/Family verbalized understanding of follow-up arrangements: Yes  Individual responsible for coordination of the follow-up plan: Santiago Glad, [phone]  Confirmed correct DME delivered: Dyanne Iha 03/01/2021    Dyanne Iha

## 2021-03-01 NOTE — Discharge Instructions (Signed)
Inpatient Rehab Discharge Instructions  Leonard [name] Discharge date and time: No discharge date for patient encounter.   Activities/Precautions/ Functional Status: Activity: activity as tolerated Diet: Dysphagia #2 thin liquids Wound Care: Routine skin checks Functional status:  ___ No restrictions     ___ Walk up steps independently ___ 24/7 supervision/assistance   ___ Walk up steps with assistance ___ Intermittent supervision/assistance  ___ Bathe/dress independently ___ Walk with walker     _x__ Bathe/dress with assistance ___ Walk Independently    ___ Shower independently ___ Walk with assistance    ___ Shower with assistance ___ No alcohol     ___ Return to work/school ________  COMMUNITY REFERRALS UPON DISCHARGE:     Outpatient: PT     OT    ST                Agency: [facility] Phone: [phone]              Appointment Date/Time: TBD at Discharge  Medical Equipment/Items Ordered: Tub Transfer Bench                                                 Agency/Supplier: [employer]   Special Instructions: No driving smoking or alcohol STROKE/TIA DISCHARGE INSTRUCTIONS SMOKING Cigarette smoking nearly doubles your risk of having a stroke & is the single most alterable risk factor  If you smoke or have smoked in the last 12 months, you are advised to quit smoking for your health.  Most of the excess cardiovascular risk related to smoking disappears within a year of stopping.  Ask you doctor about anti-smoking medications  [place] Quit Line: 1-800-QUIT NOW  Free Smoking Cessation Classes [phone]  CHOLESTEROL Know your levels; limit fat & cholesterol in your diet  Lipid Panel     Component Value Date/Time   CHOL 195 01/25/2021 0342   TRIG 91 01/26/2021 0441   HDL 38 (L) 01/25/2021 0342   CHOLHDL 5.1 01/25/2021 0342   VLDL 31 01/25/2021 0342   LDLCALC 126 (H) 01/25/2021 0342      Many patients benefit from treatment even if their cholesterol is at  goal.  Goal: Total Cholesterol (CHOL) less than 160  Goal:  Triglycerides (TRIG) less than 150  Goal:  HDL greater than 40  Goal:  LDL (LDLCALC) less than 100   BLOOD PRESSURE American Stroke Association blood pressure target is less that 120/80 mm/Hg  Your discharge blood pressure is:     Monitor your blood pressure  Limit your salt and alcohol intake  Many individuals will require more than one medication for high blood pressure  DIABETES (A1c is a blood sugar average for last 3 months) Goal HGBA1c is under 7% (HBGA1c is blood sugar average for last 3 months)  Diabetes: No known diagnosis of diabetes    Lab Results  Component Value Date   HGBA1C 5.8 (H) 01/25/2021     Your HGBA1c can be lowered with medications, healthy diet, and exercise.  Check your blood sugar as directed by your physician  Call your physician if you experience unexplained or low blood sugars.  PHYSICAL ACTIVITY/REHABILITATION Goal is 30 minutes at least 4 days per week  Activity: Increase activity slowly, Therapies: Physical Therapy: Home Health Return to work:   Activity decreases your risk of heart attack and stroke  and makes your heart stronger.  It helps control your weight and blood pressure; helps you relax and can improve your mood.  Participate in a regular exercise program.  Talk with your doctor about the best form of exercise for you (dancing, walking, swimming, cycling).  DIET/WEIGHT Goal is to maintain a healthy weight  Your discharge diet is:  Diet Order    None      liquids Your height is:    Your current weight is:   Your Body Mass Index (BMI) is:     Following the type of diet specifically designed for you will help prevent another stroke.  Your goal weight range is:    Your goal Body Mass Index (BMI) is 19-24.  Healthy food habits can help reduce 3 risk factors for stroke:  High cholesterol, hypertension, and excess weight.  RESOURCES Stroke/Support Group:  Call  [phone]   STROKE EDUCATION PROVIDED/REVIEWED AND GIVEN TO PATIENT Stroke warning signs and symptoms How to activate emergency medical system (call 911). Medications prescribed at discharge. Need for follow-up after discharge. Personal risk factors for stroke. Pneumonia vaccine given:  Flu vaccine given:  My questions have been answered, the writing is legible, and I understand these instructions.  I will adhere to these goals & educational materials that have been provided to me after my discharge from the hospital.      My questions have been answered and I understand these instructions. I will adhere to these goals and the provided educational materials after my discharge from the hospital.  Patient/Caregiver Signature _______________________________ Date __________  Clinician Signature _______________________________________ Date __________  Please bring this form and your medication list with you to all your follow-up doctor's appointments.

## 2021-03-01 NOTE — Progress Notes (Signed)
Speech Language Pathology Discharge Summary  Patient Details  Name: Leonard [name] MRN: [id number] Date of Birth: 1956-09-05    Patient has met 7 of 9 long term goals.  Patient to discharge at overall Supervision;Min level.  Reasons goals not met: Patient continues to require minA for initiaiton, using swallow strategies and performing exercises.   Clinical Impression/Discharge Summary: Patient made good progress and met 7 of 9 LTG's and currently at supervision level for basic level communication, safety, problem solving but minA for consistently performing swallow precautions and for initiating during PO intake, problem solving tasks, ADL tasks. Patient will benefit from home health or outpatient SLP intervention to continue to improve with his speech, cognitive and swallow function goals. Recent MBS showed improved pharyngeal swallow however continued moderately delayed oral phase of swallow.  Care Partner:  Caregiver Able to Provide Assistance: Yes  Type of Caregiver Assistance: Physical;Cognitive  Recommendation:  Outpatient SLP;Home Health SLP  Rationale for SLP Follow Up: Maximize cognitive function and independence;Maximize swallowing safety;Maximize functional communication   Equipment: N/A for ST   Reasons for discharge: Discharged from hospital   Patient/Family Agrees with Progress Made and Goals Achieved: Yes    Sonia Baller, MA, CCC-SLP Speech Therapy [facility]

## 2021-03-12 ENCOUNTER — Ambulatory Visit: Payer: Medicare HMO

## 2021-03-12 ENCOUNTER — Other Ambulatory Visit: Payer: Self-pay

## 2021-03-12 ENCOUNTER — Ambulatory Visit: Payer: Medicare HMO | Attending: Physician Assistant

## 2021-03-12 VITALS — BP 126/72 | HR 103 | Resp 17 | Ht 70.0 in | Wt 150.0 lb

## 2021-03-12 DIAGNOSIS — R2689 Other abnormalities of gait and mobility: Secondary | ICD-10-CM | POA: Insufficient documentation

## 2021-03-12 DIAGNOSIS — R262 Difficulty in walking, not elsewhere classified: Secondary | ICD-10-CM | POA: Insufficient documentation

## 2021-03-12 DIAGNOSIS — R1312 Dysphagia, oropharyngeal phase: Secondary | ICD-10-CM | POA: Diagnosis not present

## 2021-03-12 DIAGNOSIS — R2681 Unsteadiness on feet: Secondary | ICD-10-CM | POA: Insufficient documentation

## 2021-03-12 DIAGNOSIS — M6281 Muscle weakness (generalized): Secondary | ICD-10-CM | POA: Diagnosis not present

## 2021-03-12 DIAGNOSIS — R278 Other lack of coordination: Secondary | ICD-10-CM | POA: Insufficient documentation

## 2021-03-12 DIAGNOSIS — R41841 Cognitive communication deficit: Secondary | ICD-10-CM | POA: Diagnosis not present

## 2021-03-12 DIAGNOSIS — I63511 Cerebral infarction due to unspecified occlusion or stenosis of right middle cerebral artery: Secondary | ICD-10-CM | POA: Diagnosis not present

## 2021-03-12 NOTE — Therapy (Signed)
[facility] MAIN [facility] SERVICES [address] [place], [place], [address] Phone: [phone]   Fax:  [phone]  Physical Therapy Evaluation  Patient Details  Name: Keyston Ardolino Acy MRN: [id number] Date of Birth: 1955-11-08 Referring Provider (PT): Charlton Amor, PA-C   Encounter Date: 03/12/2021   PT End of Session - 03/12/21 2230    Visit Number 1    Number of Visits 25    Date for PT Re-Evaluation 06/04/21    Authorization Time Period Initial Certification 03/12/2021- 06/04/2021    PT Start Time 1015    PT Stop Time 1059    PT Time Calculation (min) 44 min    Equipment Utilized During Treatment Gait belt    Activity Tolerance Patient tolerated treatment well    Behavior During Therapy [facility] for tasks assessed/performed           History reviewed. No pertinent past medical history.  Past Surgical History:  Procedure Laterality Date  . IR ANGIO INTRA EXTRACRAN SEL COM CAROTID INNOMINATE UNI L MOD SED  01/25/2021  . IR CT HEAD LTD  01/25/2021  . IR CT HEAD LTD  01/25/2021  . IR PERCUTANEOUS ART THROMBECTOMY/INFUSION INTRACRANIAL INC DIAG ANGIO  01/25/2021  . RADIOLOGY WITH ANESTHESIA N/A 01/24/2021   Procedure: IR WITH ANESTHESIA;  Surgeon: Julieanne Cotton, MD;  Location: [facility] OR;  Service: Radiology;  Laterality: N/A;    Vitals:   03/12/21 1026  BP: 126/72  Pulse: (!) 103  Resp: 17  SpO2: 97%  Weight: 150 lb (68 kg)  Height: 5\' 10"  (1.778 m)      Subjective Assessment - 03/12/21 1028    Subjective I am doing pretty well- My left hand is my main concern as I am not able to do too much with it.    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 Patient received Inpatient Rehab services and has unremarkable Past medical history.Hospital course complicated by acute hypoxic respiratory failure due to COVID-19 pneumonia possible aspiration.    Limitations  Lifting;House hold activities    How long can you sit comfortably? no limit    How long can you stand comfortably? 5 min    How long can you walk comfortably? 200 feet    Patient Stated Goals Get my arm working again    Currently in Pain? Yes    Pain Score 4     Pain Location --   Right upper trap   Pain Orientation Right    Pain Descriptors / Indicators Aching;Sore    Pain Type Acute pain    Pain Onset 1 to 4 weeks ago    Pain Frequency Intermittent    Aggravating Factors  pushing up from chairs    Pain Relieving Factors Rest    Effect of Pain on Daily Activities Difficulty with transfers, UE activities    Multiple Pain Sites No              [facility] PT Assessment - 03/12/21 1032      Assessment   Referring Provider (PT) 05/12/21, PA-C    Onset Date/Surgical Date 01/22/21    Hand Dominance Right    Next MD Visit 04/02/2021   Dr. 04/04/2021   Prior Therapy Yes   Inpatient rehab     Precautions   Precautions Fall      Restrictions   Weight Bearing  Restrictions No      Balance Screen   Has the patient fallen in the past 6 months No    Has the patient had a decrease in activity level because of a fear of falling?  Yes    Is the patient reluctant to leave their home because of a fear of falling?  No      Home Tourist information centre manager residence    Living Arrangements Spouse/significant other    Available Help at Discharge Family;Friend(s)    Type of Home House    Home Access Stairs to enter    Entrance Stairs-Number of Steps 5   Front steps   Entrance Stairs-Rails None    Home Layout One level    Home Equipment Dexter - 2 wheels;Cane - quad;Bedside commode;Shower seat      Prior Function   Level of Independence Independent    Vocation Other (comment)   Semi- retired Curator (car)   Leisure Go to [employer], boating      Cognition   Overall Cognitive Status Within Functional Limits for tasks assessed    Attention Focused    Focused Attention  Appears intact    Memory Appears intact    Awareness Appears intact    Problem Solving Appears intact    Executive Function Sequencing;Organizing;Decision Making;Initiating;Self Monitoring;Self Correcting    Sequencing Appears intact    Organizing Appears intact    Decision Making Appears intact    Initiating Appears intact    Self Monitoring Appears intact    Self Correcting Appears intact      Observation/Other Assessments   Focus on Therapeutic Outcomes (FOTO)  68           OBJECTIVE  MUSCULOSKELETAL: Tremor: Absent Bulk: Normal Tone: Normal, no clonus  Posture No gross abnormalities noted in standing or seated posture  Gait Short reciprocal steps, decreased left arm sway, no assistive device and adequate heel strike.   Strength R/L 5/4 Hip flexion 5/4 Hip external rotation 5/4 Hip internal rotation 5/4 Hip extension  5/4 Hip abduction 5/4 Hip adduction 5/4 Knee extension 5/4 Knee flexion 5/4 Ankle Plantarflexion 5/4 Ankle Dorsiflexion   NEUROLOGICAL:  Mental Status Patient is oriented to person, place and time.  Recent memory is intact.  Remote memory is intact.  Attention span and concentration are intact.  Expressive speech is intact.  Patient's fund of knowledge is within normal limits for educational level.   Sensation Grossly intact to light touch bilateral UEs/LEs as determined by testing dermatomes C2-T2/L2-S2 respectively Proprioception and hot/cold testing deferred on this date   Coordination/Cerebellar Finger to Nose: WNL on right; impaired on left Heel to Shin: WNL Rapid alternating movements: WNL on right UE; Impaired on left Finger Opposition: WNL on right; Imparied on left   FUNCTIONAL OUTCOME MEASURES   Results Comments              TUG  13.58 seconds Using small base QC  5TSTS  25.77 seconds Without UE support      10 Meter Gait Speed Self-selected: 16.23 s = 0.62 m/s Below normative values for full community ambulation                ASSESSMENT Clinical Impression: Pt is a pleasant 65 year-old male referred with diagnosis of R MCA CVA. PT examination reveals deficits as patient presents with deficits in Left UE/LE strength and gait impairments. Patient was 15 min late so unable to complete testing. Patient will still need to  be tested with functional endurance and balance outcome measures and will need goals as appropriate. Pt will benefit from skilled PT services to address his LE muscle weakness  And impaired functional mobility for optimal return to independence in the home.                        Objective measurements completed on examination: See above findings.               PT Education - 03/12/21 2229    Education Details PT plan of care    [name]    Methods Explanation;Verbal cues    Comprehension Verbalized understanding;Verbal cues required;Need further instruction            PT Short Term Goals - 03/12/21 2243      PT SHORT TERM GOAL #1   Title Pt will be independent with HEP in order to improve strength and balance in order to decrease fall risk and improve function at home and work.    Baseline 03/12/2021- Patient with no HEP in place    Time 6    Period Weeks    Status New    Target Date 04/23/21             PT Long Term Goals - 03/12/21 2245      PT LONG TERM GOAL #1   Title Pt will improve FOTO to target score to  75 to display perceived improvements in ability to complete ADL's.    Baseline 03/12/2021= 68/100    Time 12    Period Weeks    Status New    Target Date 06/04/21      PT LONG TERM GOAL #2   Title Pt will decrease 5TSTS by at least  5 seconds in order to demonstrate clinically significant improvement in LE strength.    Baseline 03/12/2021= 25.77 sec without UE support    Time 12    Period Weeks    Status New    Target Date 06/04/21      PT LONG TERM GOAL #3   Title Pt will decrease TUG to below 11  seconds/decrease in order to demonstrate decreased fall risk.    Baseline 03/12/2021= 13.58 sec with use of cane    Time 12    Period Weeks    Status New    Target Date 06/04/21      PT LONG TERM GOAL #4   Title Pt will increase by at least 0.13 m/s in order to demonstrate clinically significant improvement in community ambulation.    Baseline 03/12/2021= 0.62 m/s with use of cane    Time 12    Period Weeks    Status New    Target Date 06/04/21                  Plan - 03/12/21 2233    Clinical Impression Statement Pt is a pleasant 65 year-old male referred with diagnosis of R MCA CVA. PT examination reveals deficits as patient presents with deficits in Left UE/LE strength and gait impairments. Patient was 15 min late so unable to complete testing. Patient will still need to be tested with functional endurance and balance outcome measures and will need goals as appropriate. Pt will benefit from skilled PT services to address his LE muscle weakness  And impaired functional mobility for optimal return to independence in the home.    Examination-Activity Limitations Caring for [employer]  Examination-Participation Restrictions Cleaning;Community Activity;Driving;Laundry;Occupation;Yard Work    Stability/Clinical Decision Making Stable/Uncomplicated    Visual merchandiserClinical Decision Making High    Rehab Potential Good    PT Frequency 2x / week    PT Duration 12 weeks    PT Treatment/Interventions ADLs/Self Care Home Management;Cryotherapy;Moist Heat;Gait training;DME Instruction;Stair training;Therapeutic activities;Functional mobility training;Therapeutic exercise;Balance training;Neuromuscular re-education;Patient/family education;Manual techniques;Passive range of motion    PT Next Visit Plan Test 6 min walk test and balance testing - add appropriate goals    PT Home Exercise Plan To be initiated next 1-2 sessions    Consulted and Agree with Plan of Care  Patient;Family member/caregiver    Family Member Consulted sister in law           Patient will benefit from skilled therapeutic intervention in order to improve the following deficits and impairments:  Decreased activity tolerance,Decreased endurance,Decreased mobility,Decreased range of motion,Decreased strength,Difficulty walking,Impaired perceived functional ability,Impaired UE functional use  Visit Diagnosis: Difficulty in walking, not elsewhere classified  Muscle weakness (generalized)     Problem List Patient Active Problem List   Diagnosis Date Noted  . Xerostomia   . Anemia   . Hemiparesis 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 (HCC)   . 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 Hypertension   . Tachypnea   . Leukocytosis   . Acute blood loss anemia   . Dysphagia, post-stroke   . Stroke (cerebrum) (HCC) 01/25/2021  . Middle cerebral artery embolism, right 01/25/2021    Lenda KelpJeffrey N Nyriah Coote, PT 03/13/2021, 8:08 AM  [facility] [address] [place], [place], [address] Phone: [phone]   Fax:  [phone]  Name: Gerrit FriendsRobert M Pecora MRN: [id number] Date of Birth: 1955/12/28

## 2021-03-14 ENCOUNTER — Other Ambulatory Visit: Payer: Self-pay

## 2021-03-14 ENCOUNTER — Ambulatory Visit: Payer: Medicare HMO | Admitting: Occupational Therapy

## 2021-03-14 DIAGNOSIS — R262 Difficulty in walking, not elsewhere classified: Secondary | ICD-10-CM | POA: Diagnosis not present

## 2021-03-14 DIAGNOSIS — R41841 Cognitive communication deficit: Secondary | ICD-10-CM | POA: Diagnosis not present

## 2021-03-14 DIAGNOSIS — M6281 Muscle weakness (generalized): Secondary | ICD-10-CM | POA: Diagnosis not present

## 2021-03-14 DIAGNOSIS — R278 Other lack of coordination: Secondary | ICD-10-CM

## 2021-03-14 DIAGNOSIS — R2681 Unsteadiness on feet: Secondary | ICD-10-CM | POA: Diagnosis not present

## 2021-03-14 DIAGNOSIS — I63511 Cerebral infarction due to unspecified occlusion or stenosis of right middle cerebral artery: Secondary | ICD-10-CM | POA: Diagnosis not present

## 2021-03-14 DIAGNOSIS — R1312 Dysphagia, oropharyngeal phase: Secondary | ICD-10-CM | POA: Diagnosis not present

## 2021-03-14 DIAGNOSIS — R2689 Other abnormalities of gait and mobility: Secondary | ICD-10-CM | POA: Diagnosis not present
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
## 2021-03-15 ENCOUNTER — Ambulatory Visit: Payer: Medicare HMO | Admitting: Speech Pathology

## 2021-03-15 DIAGNOSIS — R278 Other lack of coordination: Secondary | ICD-10-CM | POA: Diagnosis not present

## 2021-03-15 DIAGNOSIS — R2689 Other abnormalities of gait and mobility: Secondary | ICD-10-CM | POA: Diagnosis not present

## 2021-03-15 DIAGNOSIS — R41841 Cognitive communication deficit: Secondary | ICD-10-CM | POA: Diagnosis not present

## 2021-03-15 DIAGNOSIS — M6281 Muscle weakness (generalized): Secondary | ICD-10-CM | POA: Diagnosis not present

## 2021-03-15 DIAGNOSIS — R1312 Dysphagia, oropharyngeal phase: Secondary | ICD-10-CM

## 2021-03-15 DIAGNOSIS — R2681 Unsteadiness on feet: Secondary | ICD-10-CM | POA: Diagnosis not present

## 2021-03-15 DIAGNOSIS — I63511 Cerebral infarction due to unspecified occlusion or stenosis of right middle cerebral artery: Secondary | ICD-10-CM | POA: Diagnosis not present

## 2021-03-15 DIAGNOSIS — R262 Difficulty in walking, not elsewhere classified: Secondary | ICD-10-CM | POA: Diagnosis not present

## 2021-03-15 NOTE — Patient Instructions (Signed)
Swallowing:  Stick to softer foods and foods that come and stay together in a cohesive ball when chewed (Dysphagia 3 textures). Continue with thin liquids. Ok to take pills one at a time with applesauce.   Avoid things like salad, raw fruits and vegetables, nuts, popcorn, hard and crunchy/dry foods.   Reduce distractions at mealtimes: turn of the TV, don't talk while eating.   Eat slowly. Make sure your mouth is clear before you take another bite or sip.  Cognition: Bring a binder with you next time with sections to organize your therapy materials (sections for speech, OT, PT)  Using a Calendar System to Help Your Memory  Carry your calendar with you at all times Decide on a place for your calendar Choose a place to keep your calendar at home Put it back in the same place when you finish recording information or when you return home Review your calendar with someone. It Krell be helpful for someone to give you advice about information to record.  Record information at the time it occurs.  Don't wait until later to write down information Ask others for a moment to record in your calendar Review your calendar at least twice a day. Morning: check plans, write "To Do" list Evening: review day, make notes, plan for next day, transfer incomplete tasks to another day Write in all appointments. Eg. doctor, dentist, school, hairdresser Write in weekly scheduled events. Record things you do each week such as: your therapy schedule, exercise class, garbage day, Sunday school Add scheduled events for things you have trouble remembering, such as: completing exercises, making phone calls, reviewing your budget Write in upcoming events. Birthdays, holidays, family activities, social events, bills due Write a "To Do" list every day. Include items you need to do and items you are asked to do Check items off ONLY after you do them Record things you can achieve on that day. Look to another day if you  are too busy. Record details from your day. Write down information from conversations so you can follow up later. Record information from appointments so you can remember what was said. Reference your calendar throughout the day. Check off items on your "To Do" list Check your schedule Plan out larger tasks. Break down into steps and write in your calendar to make the task more manageable Review your calendar every few days. Make sure you have transferred "To-Do" items have not completed to another day Review the events you've recording and try to remember some details Review your calendar at the end of the month. Copy events to the next month.

## 2021-03-15 NOTE — Therapy (Signed)
OUTPATIENT SPEECH LANGUAGE PATHOLOGY EVALUATION   Patient Name: Leonard [name] MRN: [id number] DOB:[birth year], 65 y.o., male Today's Date: 03/15/2021  PCP: Oneita Hurt, No REFERRING PROVIDER: Charlton Amor, PA-C    No past medical history on file. 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   Hypertension    Tachypnea    Leukocytosis    Acute blood loss anemia    Dysphagia, post-stroke    Stroke (cerebrum) (HCC) 01/25/2021   Middle cerebral artery embolism, right 01/25/2021     03/15/21 1028  SLP Visits / Re-Eval  Visit Number 1  Number of Visits 25  Date for SLP Re-Evaluation 06/13/21  Authorization  Authorization Type [employer] [facility]  Authorization Time Period 16 visits auth 6/13-8/13  Authorization - Visit Number 1  Progress Note Due on Visit 10  SLP Time Calculation  SLP Start Time 1000  SLP Stop Time  1100  SLP Time Calculation (min) 60 min  SLP - End of Session  Activity Tolerance Patient tolerated treatment well    Onset date: 01/24/21  REFERRING DIAG: Right MCA CVA  THERAPY DIAG:  Cognitive communication deficit  Dysphagia, oropharyngeal phase  Right middle cerebral artery stroke (HCC)  SUBJECTIVE:   SUBJECTIVE STATEMENT: Pt denies current cognitive deficits                                                                                                                                                                                                                PAIN:  Are you having pain? Yes, VAS Scale 5/10, Location: right shoulder   FALLS: Has patient fallen in last 6 months? 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 male with no pertinent past medical history, who presented to ED on 01/24/2021 via EMS as code stroke, acute onset of left facial droop, dysarthria and left hemineglect. MRI from 4/21 revealed large acute infarct of right MCA, involving frontal operculum, insula and basal ganglia. Resulting dysphagia, dysarthria, and cognitive communication impairments. Had NG but progressed to dysphagia 2, thin liquids by time of d/c from CIR (5/13-5/26/22).   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 Findings illustrate a loss of gray-white matter within right insula and MCA area. MRI from 01/25/21 showed a large acute infarct of right MCA, frontal operculum, insula and basal ganglia. MBS from 02/14/21 showed that patient demonstrated oropharyngeal dysphagia, but presents with improved oropharyngeal function.  COGNITION: Overall cognitive status: Impaired/different from baseline Attention:IMPAIRED: Sustained, Selective, Comment: self-distracts during naming task, symbol cancellation impaired, left inattention (misses symbols on left, unaware glasses not hooked over his left ear).  Memory: IMPAIRED: Short-term, Prospective, Comment: family is helping manage medications and appointments Awareness: IMPAIRED: Intellectual, Comment: Initially denies deficits; some  awareness of errors in tasks, but largely unaware without cues Executive function:Impaired: Impulse Control, Problem Solving, Organization, Planning, Comment: frequently starting tasks before instructions complete, son reports impulsive at home, poor spacing/organization with clock drawing, poor planning with mazes task  Behavior: Impulsive    COGNITIVE COMMUNICATION Commands: Follows multi-step commands inconsistently Auditory comprehension: WFL, Comments:misses information due to attention Verbal expression: WFL Functional communication: WFL  ORAL MOTOR EXAMINATION  Facial : Symmetry impaired: Impaired left, Suspect CN VII impairment  Lingual: Symmetry Impaired: Impaired left, Suspect CN XII impairment, Strength Impaired: Impaired left, Suspect CN XII impairment  Velum: WFL  Mandible: WFL  Cough: WFL  Voice: WFL    STANDARDIZED ASSESSMENTS:     Cognitive Linguistic Quick Test: AGE - 18 - 69   The Cognitive Linguistic Quick Test (CLQT) was administered to assess the relative status of five cognitive domains: attention, memory, language, executive functioning, and visuospatial skills. Scores from 10 tasks were used to estimate severity ratings (standardized for age groups 18-69 years and 70-89 years) for each domain, a clock drawing task, as well as an overall composite severity rating of cognition.       Task Score Criterion Cut Scores  Personal Facts 8/8 8  Symbol Cancellation 10/12 11  Confrontation Naming 10/10 10  Clock Drawing  9/13 12  Story Retelling 7/10 6  Symbol Trails 3/10 9  Generative Naming 4/9 5  Design Memory 5/6 5  Mazes  3/8 7  Design Generation 3/13 6    Cognitive Domain Composite Score Severity Rating  Attention 132/215 Mild  Memory 152/185 Mild  Executive Function 13/40 Severe  Language 29/37 WNL  Visuospatial Skills 58/105 Mild  Clock Drawing  9/13 Moderate  Composite Severity Rating 2.8 Mild   CLINICAL SWALLOW EVALUATION    Consistencies tested: Thin liquids, puree, regular solid cracker  Patient presents with s/sx of mild oral dysphagia characterized by left lingual and labial/facial weakness. No overt signs of aspiration were observed,  however intermittent trace left lingual residue noted. Pt, son report coughing when eating at home; has eaten regular solids such as salad. SLP had to  redirect pt repeatedly to refrain from talking while eating; cognitive deficits and reduced sustained attention contribute to pt's aspiration risk. Patient was also  noted to drool intermittently  throughout assessment.  Diet recommendation: Dysphagia 3 (mechanical soft), thin liquids  Medications: whole, one at a time in puree  Strategies/Compensations: Reduce distractions at mealtimes, lingual sweep for oral clearance, slow rate, small bites and sips  Aspiration risk: Mild (if adhering to diet recommendations and using aspiration precautions)   PATIENT REPORTED OUTCOME MEASURES:  Administered patient-reported outcome measure Multifactorial Memory Questionnaire (MMQ) Memory Mistakes: Raw score = 42, t score = 44,    equivalent to Average (Below 20 "very low", 20-29 "low", 30-39 "below average", 40-60 "average", 60-70 "above average", 71-80 "high", above 80  "very high")    PATIENT EDUCATION: Education details: Cognitive deficit areas, swallowing recommendations Person educated: Patient and Child(ren) Education method: Explanation and Handouts Education comprehension: verbalized understanding, verbal cues required, and needs further education   ASSESSMENT:  CLINICAL IMPRESSION: Patient is a 65 y.o. male who presents with overall moderate cognitive impairments and mild oral dysphagia. No dysarthria noted during conversational speech. Per son, patient is not adhering to dys 2/thin liquids as recommended at d/c. SLP reassessed and recommended dys 3, thin liquids and reinforced swallow precautions. Objective  impairments, per the Cognitive Linguistic Quick Test include mild deficits in attention, memory and visuospatial skills, moderate deficits for clock drawing task and severe deficits in executive functions. Language skills ranged WNL. Patient scored within the mild range, however clinical observation during assessment is more consistent with overall moderate impairments. During some tasks on assessment, patient demonstrated impulsivity by starting tasks before instructions were completed. These impairments are limiting patient from maintaining an independent lifestyle. Patient hopes to return to independent lifestyle by managing medication, managing finances for shop/business and completing household chores. Patient and son both mention changes and difficulties with short-term memory. Patient will benefit from skilled SLP services to address above impairments and improve overall function.  REHAB POTENTIAL: Good  GOALS:    SLP Short Term Goals - 03/15/21 1545       SLP SHORT TERM GOAL #1   Title Patient will demonstrate intellectual awareness by telling SLP 3 cognitive deficits.    Time 10    Period --   sessions   Status New      SLP SHORT TERM GOAL #2   Title Patient will maintain selective attention (internal or external distractions) to functional tasks for 10 minutes with no more than 2 redirections to task.    Time 10    Period --   sessions   Status New      SLP SHORT TERM GOAL #3   Title Patient will establish external aid for memory/executive function and bring to more than 75% of therapy sessions.    Time 10    Period --   sessions   Status New      SLP SHORT TERM GOAL #4   Title pt will demonstrate aspiration precautions with recommended POs x3 sessions with no overt s/sx aspiration    Time 10    Period --   sessions   Status New             SLP Long Term Goals - 03/15/21 1550       SLP LONG TERM GOAL #1   Title Patient will demonstrate anticipatory awareness by  identifying cognitive-communication barriers and using appropriate compensatory strategies.    Time 12    Period Weeks    Status New    Target Date 06/13/21      SLP LONG TERM GOAL #2   Title Patient will demonstrate divided  attention with appropriate use of compensatory strategies for 15 minutes.    Time 12    Period Weeks    Status New    Target Date 09/05/23      SLP LONG TERM GOAL #3   Title pt will demo [facility] skills in solving mod complex problems in WNL amount of time with modified independence (double checking answers , etc)    Time 12    Period Weeks    Status New    Target Date 06/13/21      SLP LONG TERM GOAL #4   Title Pt will use external aids to manage schedule, appointments, and medical information independently.    Time 12    Period Weeks    Status New    Target Date 06/13/21                PLAN:  SLP FREQUENCY: 2x/week  SLP DURATION: 12 weeks  PLANNED INTERVENTIONS: Aspiration precaution training, Diet toleration management , Environmental controls, Compensatory techqniques, Trials of upgraded texture/liquids, Cueing hierachy, Cognitive reorganization, Internal/external aids, Oral motor exercises, Functional tasks, SLP instruction and feedback, and Compensatory strategies  PLAN FOR NEXT SESSION: target deficit awareness, use of binder for memory and executive function, establish cognitive home program     [facility] MAIN [facility] SERVICES [address] [place], [place], [address] Phone: [phone]   Fax:  [phone]  Patient Details  Name: Leonard [name] MRN: [id number] Date of Birth: [birth year] Referring Provider:  Charlton Amor, PA-C  Encounter Date: 03/15/2021  Rondel Baton, MS, CCC-SLP Speech-Language Pathologist  Arlana Lindau 03/15/2021, 10:29 AM  [facility] MAIN [facility] SERVICES [address] [place], [place], [address] Phone: [phone]   Fax:   [phone]

## 2021-03-19 ENCOUNTER — Other Ambulatory Visit: Payer: Self-pay

## 2021-03-19 ENCOUNTER — Ambulatory Visit: Payer: Medicare HMO | Admitting: Speech Pathology

## 2021-03-19 ENCOUNTER — Ambulatory Visit: Payer: Medicare HMO | Admitting: Physical Therapy

## 2021-03-19 ENCOUNTER — Encounter: Payer: Self-pay | Admitting: Physical Therapy

## 2021-03-19 DIAGNOSIS — R41841 Cognitive communication deficit: Secondary | ICD-10-CM | POA: Diagnosis not present

## 2021-03-19 DIAGNOSIS — R262 Difficulty in walking, not elsewhere classified: Secondary | ICD-10-CM | POA: Diagnosis not present

## 2021-03-19 DIAGNOSIS — R2681 Unsteadiness on feet: Secondary | ICD-10-CM | POA: Diagnosis not present

## 2021-03-19 DIAGNOSIS — R278 Other lack of coordination: Secondary | ICD-10-CM

## 2021-03-19 DIAGNOSIS — R2689 Other abnormalities of gait and mobility: Secondary | ICD-10-CM | POA: Diagnosis not present

## 2021-03-19 DIAGNOSIS — M6281 Muscle weakness (generalized): Secondary | ICD-10-CM | POA: Diagnosis not present

## 2021-03-19 DIAGNOSIS — I63511 Cerebral infarction due to unspecified occlusion or stenosis of right middle cerebral artery: Secondary | ICD-10-CM | POA: Diagnosis not present

## 2021-03-19 DIAGNOSIS — R1312 Dysphagia, oropharyngeal phase: Secondary | ICD-10-CM | POA: Diagnosis not present

## 2021-03-19 NOTE — Therapy (Signed)
[facility] MAIN [facility] SERVICES [address] [place], [place], [address] Phone: [phone]   Fax:  [phone]  Speech Language Pathology Treatment  Leonard [name] Details  Name: Leonard [name] MRN: [id number] Date of Birth: 03-May-1956 No data recorded  Encounter Date: 03/19/2021   End of Session - 03/19/21 0938     Visit Number 2    Number of Visits 25    Date for SLP Re-Evaluation 06/13/21    Authorization Type [employer] [facility]    Authorization Time Period 16 visits auth 6/13-8/13    Authorization - Visit Number 2    Progress Note Due on Visit 10    SLP Start Time 0900    SLP Stop Time  1000    SLP Time Calculation (min) 60 min    Activity Tolerance Leonard [name] tolerated treatment well             No past medical history on file.  Past Surgical History:  Procedure Laterality Date   IR ANGIO INTRA EXTRACRAN SEL COM CAROTID INNOMINATE UNI L MOD SED  01/25/2021   IR CT HEAD LTD  01/25/2021   IR CT HEAD LTD  01/25/2021   IR PERCUTANEOUS ART THROMBECTOMY/INFUSION INTRACRANIAL INC DIAG ANGIO  01/25/2021   RADIOLOGY WITH ANESTHESIA N/A 01/24/2021   Procedure: IR WITH ANESTHESIA;  Surgeon: Julieanne Cotton, MD;  Location: [facility] OR;  Service: Radiology;  Laterality: N/A;    There were no vitals filed for this visit.   Subjective Assessment - 03/19/21 0936     Subjective Pt sat in wrong chair    Leonard [name] is accompained by: Family member    Currently in Pain? Yes    Pain Score 4     Pain Location Shoulder                   ADULT SLP TREATMENT - 03/19/21 0937       General Information   Behavior/Cognition Alert;Cooperative;Other (comment)   flat affect   HPI Leonard y.o. male with no pertinent past medical history, who presented to ED on 01/24/2021 via EMS as code stroke, acute onset of left facial droop, dysarthria and left hemineglect. MRI from 4/21 revealed large acute infarct of right MCA, involving frontal operculum, insula and basal ganglia.  Resulting dysphagia, dysarthria, and cognitive communication impairments. Had NG but progressed to dysphagia 2, thin liquids by time of d/c from CIR (5/13-5/26/22).      Treatment Provided   Treatment provided Cognitive-Linquistic      Cognitive-Linquistic Treatment   Treatment focused on Cognition;Leonard [name]/family/caregiver education    Skilled Treatment SLP reviewed evaluation findings with pt and son, used pt errors to explain deficit areas. Pt brought binder from inpatient rehab; min-mod cues necessary for navigating between sections for PT, OT, ST. Education on using calendar for orientation, recall and executive function. Provided blank calendar worksheet; pt required usual mod-max cues to fill in appropriate dates. He initially stated month was July; required cues to check his phone (and alternate attention between his phone and calendar to write today's date accurately). Mod-max cues for transferring appointment information, recording specifics (time, discipline), and adding other plans for the week. Targeted attention and error awareness with functional caculations (change). Leonard [name] requested paper to take notes. Required extended time to complete 6 calculations, but independently decided to check for errors. Required cues for awareness of attention laspses (checking work from 2 different problems) but was able to provide an appropriate solution to improve his accuracy (use  a paper to go line-by-line). Initial accuracy was 50%, improved to 100% with double checking. Educated on neuroplasticity and importance of working outside ST each day on cognitive tasks. Homework provided.      Assessment / Recommendations / Plan   Plan Continue with current plan of care      Progression Toward Goals   Progression toward goals Progressing toward goals              SLP Education - 03/19/21 0938     Education Details deficit areas, use of calendar/ memory book    Person(s) Educated Leonard [name];Child(ren)     Methods Explanation    Comprehension Verbalized understanding;Need further instruction              SLP Short Term Goals - 03/15/21 1545       SLP SHORT TERM GOAL #1   Title Leonard [name] will demonstrate intellectual awareness by telling SLP 3 cognitive deficits.    Time 10    Period --   sessions   Status New      SLP SHORT TERM GOAL #2   Title Leonard [name] will maintain selective attention (internal or external distractions) to functional tasks for 10 minutes with no more than 2 redirections to task.    Time 10    Period --   sessions   Status New      SLP SHORT TERM GOAL #3   Title Leonard [name] will establish external aid for memory/executive function and bring to more than 75% of therapy sessions.    Time 10    Period --   sessions   Status New      SLP SHORT TERM GOAL #4   Title pt will demonstrate aspiration precautions with recommended POs x3 sessions with no overt s/sx aspiration    Time 10    Period --   sessions   Status New              SLP Long Term Goals - 03/15/21 1550       SLP LONG TERM GOAL #1   Title Leonard [name] will demonstrate anticipatory awareness by identifying cognitive-communication barriers and using appropriate compensatory strategies.    Time 12    Period Weeks    Status New    Target Date 06/13/21      SLP LONG TERM GOAL #2   Title Leonard [name] will demonstrate divided attention with appropriate use of compensatory strategies for 15 minutes.    Time 12    Period Weeks    Status New    Target Date 09/05/23      SLP LONG TERM GOAL #3   Title pt will demo [facility] skills in solving mod complex problems in WNL amount of time with modified independence (double checking answers , etc)    Time 12    Period Weeks    Status New    Target Date 06/13/21      SLP LONG TERM GOAL #4   Title Pt will use external aids to manage schedule, appointments, and medical information independently.    Time 12    Period Weeks    Status New    Target Date 06/13/21               Plan - 03/19/21 3220     Clinical Impression Statement Leonard [name] is a Leonard y.o. male who presents with overall moderate cognitive impairments and mild oral dysphagia. Deficits seen today in attention, recall, and executive function skills. Leonard [name] remains impulsive  but less so than during evaluation. Some emerging awareness of errors in structured tasks but requires cues to correct. Leonard [name] hopes to return to independent lifestyle by managing medication, managing finances for shop/business and completing household chores. Leonard [name] will benefit from skilled SLP services to address above impairments and improve overall function.    Speech Therapy Frequency 2x / week    Duration 12 weeks    Treatment/Interventions Aspiration precaution training;Environmental controls;Cueing hierarchy;SLP instruction and feedback;Compensatory techniques;Cognitive reorganization;Functional tasks;Compensatory strategies;Diet toleration management by SLP;Trials of upgraded texture/liquids;Internal/external aids;Multimodal communcation approach;Leonard [name]/family education    Potential to Achieve Goals Good    SLP Home Exercise Plan provided; see pt instructions for details    Consulted and Agree with Plan of Care Leonard [name]             Leonard [name] will benefit from skilled therapeutic intervention in order to improve the following deficits and impairments:   Cognitive communication deficit  Right middle cerebral artery stroke [facility])    Problem List Leonard [name] Active Problem List   Diagnosis Date Noted   Xerostomia    Anemia    Hemiparesis 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 (HCC)    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

## 2021-03-19 NOTE — Therapy (Signed)
[name] [address] [name], [place], [address] Phone: [phone]   Fax:  [phone]  Physical Therapy Treatment  Patient Details  Name: [name] MRN: [id number] Date of Birth: 1956-08-13 Referring Provider (PT): Charlton Amor, PA-C   Encounter Date: 03/19/2021   PT End of Session - 03/19/21 1357     Visit Number 2    Number of Visits 25    Date for PT Re-Evaluation 06/04/21    Authorization Time Period Initial Certification 03/12/2021- 06/04/2021    PT Start Time 0806    PT Stop Time 0845    PT Time Calculation (min) 39 min    Equipment Utilized During Treatment Gait belt    Activity Tolerance Patient tolerated treatment well    Behavior During Therapy [name] for tasks assessed/performed             History reviewed. No pertinent past medical history.  Past Surgical History:  Procedure Laterality Date   IR ANGIO INTRA EXTRACRAN SEL COM CAROTID INNOMINATE UNI L MOD SED  01/25/2021   IR CT HEAD [name]  01/25/2021   IR CT HEAD [name]  01/25/2021   IR PERCUTANEOUS ART THROMBECTOMY/INFUSION INTRACRANIAL INC DIAG ANGIO  01/25/2021   RADIOLOGY WITH ANESTHESIA N/A 01/24/2021   Procedure: IR WITH ANESTHESIA;  Surgeon: Julieanne Cotton, MD;  Location: [facility] OR;  Service: Radiology;  Laterality: N/A;    There were no vitals filed for this visit.   Subjective Assessment - 03/19/21 0810     Subjective Patient reports increased shoulder pain (R>L) which has been bothering him for a while. 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 Patient received Inpatient Rehab services and has unremarkable Past medical history.Hospital course complicated by acute hypoxic respiratory failure due to COVID-19 pneumonia possible aspiration.    Limitations Lifting;House hold activities    How long  can you sit comfortably? no limit    How long can you stand comfortably? 5 min    How long can you walk comfortably? Per son- able to walk 1/2-1 mile    Patient Stated Goals Get my arm working again    Currently in Pain? Yes    Pain Score 4     Pain Location Shoulder    Pain Orientation Right;Left    Pain Descriptors / Indicators Aching    Pain Type Chronic pain    Pain Onset 1 to 4 weeks ago    Pain Frequency Intermittent    Aggravating Factors  purshing up from chairs    Pain Relieving Factors rest    Effect of Pain on Daily Activities decreased activity tolerance;    Multiple Pain Sites No                [facility] PT Assessment - 03/19/21 0001       6 Minute Walk- Baseline   6 Minute Walk- Baseline yes    BP (mmHg) 143/100    HR (bpm) 102    02 Sat (%RA) 99 %      6 Minute walk- Post Test   6 Minute Walk Post Test yes    BP (mmHg) 149/71    HR (bpm) 98    02 Sat (%RA) 100 %    Modified Borg Scale for Dyspnea 2-  Mild shortness of breath      6 minute walk test results    Aerobic Endurance Distance Walked 860    Endurance additional comments with tripod base cane with occasional left foot drop/drag;      Standardized Balance Assessment   Standardized Balance Assessment Berg Balance Test      Berg Balance Test   Sit to Stand Able to stand without using hands and stabilize independently    Standing Unsupported Able to stand safely 2 minutes    Sitting with Back Unsupported but Feet Supported on Floor or Stool Able to sit safely and securely 2 minutes    Stand to Sit Sits safely with minimal use of hands    Transfers Able to transfer safely, minor use of hands    Standing Unsupported with Eyes Closed Able to stand 10 seconds safely    Standing Unsupported with Feet Together Able to place feet together independently and stand 1 minute safely    From Standing, Reach Forward with Outstretched Arm Can reach confidently >25 cm (10")    From Standing Position, Pick up  Object from Floor Able to pick up shoe safely and easily    From Standing Position, Turn to Look Behind Over each Shoulder Looks behind from both sides and weight shifts well    Turn 360 Degrees Able to turn 360 degrees safely but slowly    Standing Unsupported, Alternately Place Feet on Step/Stool Able to stand independently and complete 8 steps >20 seconds    Standing Unsupported, One Foot in Front Able to plae foot ahead of the other independently and hold 30 seconds    Standing on One Leg Able to lift leg independently and hold equal to or more than 3 seconds    Total Score 50    Berg comment: low risk for falls      Functional Gait  Assessment   Gait assessed  Yes    Gait Level Surface Walks 20 ft, slow speed, abnormal gait pattern, evidence for imbalance or deviates 10-15 in outside of the 12 in walkway width. Requires more than 7 sec to ambulate 20 ft.    Change in Gait Speed Makes only minor adjustments to walking speed, or accomplishes a change in speed with significant gait deviations, deviates 10-15 in outside the 12 in walkway width, or changes speed but loses balance but is able to recover and continue walking.    Gait with Horizontal Head Turns Performs head turns smoothly with slight change in gait velocity (eg, minor disruption to smooth gait path), deviates 6-10 in outside 12 in walkway width, or uses an assistive device.    Gait with Vertical Head Turns Performs task with slight change in gait velocity (eg, minor disruption to smooth gait path), deviates 6 - 10 in outside 12 in walkway width or uses assistive device    Gait and Pivot Turn Pivot turns safely within 3 sec and stops quickly with no loss of balance.    Step Over Obstacle Is able to step over 2 stacked shoe boxes taped together (9 in total height) without changing gait speed. No evidence of imbalance.    Gait with Narrow Base of Support Ambulates 4-7 steps.    Gait with Eyes Closed Walks 20 ft, slow speed, abnormal  gait pattern, evidence for imbalance, deviates 10-15 in outside 12 in walkway width. Requires more than 9 sec to ambulate 20 ft.    Ambulating Backwards Walks 20 ft, slow speed, abnormal gait  pattern, evidence for imbalance, deviates 10-15 in outside 12 in walkway width.    Steps Alternating feet, must use rail.    Total Score 17    FGA comment: Increased risk for falls                TREATMENT: PT instructed patient in 6 min walk, Berg Balance Assessment and functional gait assessment; See above;  Reinforced HEP                   PT Education - 03/19/21 0811     Education Details balance assessment/6 min walk    [name]    Methods Explanation    Comprehension Verbalized understanding              PT Short Term Goals - 03/12/21 2243       PT SHORT TERM GOAL #1   Title Pt will be independent with HEP in order to improve strength and balance in order to decrease fall risk and improve function at home and work.    Baseline 03/12/2021- Patient with no HEP in place    Time 6    Period Weeks    Status New    Target Date 04/23/21               PT Long Term Goals - 03/12/21 2245       PT LONG TERM GOAL #1   Title Pt will improve FOTO to target score to  75 to display perceived improvements in ability to complete ADL's.    Baseline 03/12/2021= 68/100    Time 12    Period Weeks    Status New    Target Date 06/04/21      PT LONG TERM GOAL #2   Title Pt will decrease 5TSTS by at least  5 seconds in order to demonstrate clinically significant improvement in LE strength.    Baseline 03/12/2021= 25.77 sec without UE support    Time 12    Period Weeks    Status New    Target Date 06/04/21      PT LONG TERM GOAL #3   Title Pt will decrease TUG to below 11 seconds/decrease in order to demonstrate decreased fall risk.    Baseline 03/12/2021= 13.58 sec with use of cane    Time 12    Period Weeks    Status New    Target Date 06/04/21       PT LONG TERM GOAL #4   Title Pt will increase 10MWT by at least 0.13 m/s in order to demonstrate clinically significant improvement in community ambulation.    Baseline 03/12/2021= 0.62 m/s with use of cane    Time 12    Period Weeks    Status New    Target Date 06/04/21                   Plan - 03/19/21 1358     Clinical Impression Statement Patient motivated and participated well within session. He was instructed in 6 min walk and other balance assessments to further assess baseline. Patient is able to walk short distances without AD however he does exhibit intermittent left foot drag. He tested as a low fall risk with Berg Balance Assessment, however is considered at an increased risk for falls with functional gait assessment. His slower gait speed is greatest limitation during functional gait assessment;  patient would benefit from skilled PT intervention to improve strength, balance and mobility;  Examination-Activity Limitations Caring for [employer]    Examination-Participation Restrictions Cleaning;Community Activity;Driving;Laundry;Occupation;Yard Work    Stability/Clinical Decision Making Stable/Uncomplicated    Rehab Potential Good    PT Frequency 2x / week    PT Duration 12 weeks    PT Treatment/Interventions ADLs/Self Care Home Management;Cryotherapy;Moist Heat;Gait training;DME Instruction;Stair training;Therapeutic activities;Functional mobility training;Therapeutic exercise;Balance training;Neuromuscular re-education;Patient/family education;Manual techniques;Passive range of motion    PT Next Visit Plan Test 6 min walk test and balance testing - add appropriate goals    PT Home Exercise Plan To be initiated next 1-2 sessions    Consulted and Agree with Plan of Care Patient;Family member/caregiver    Family Member Consulted sister in law             Patient will benefit from skilled therapeutic intervention in order to  improve the following deficits and impairments:  Decreased activity tolerance, Decreased endurance, Decreased mobility, Decreased range of motion, Decreased strength, Difficulty walking, Impaired perceived functional ability, Impaired UE functional use  Visit Diagnosis: Muscle weakness (generalized)  Other lack of coordination  Difficulty in walking, not elsewhere classified     Problem List Patient Active Problem List   Diagnosis Date Noted   Xerostomia    Anemia    Hemiparesis 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 (HCC)    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

## 2021-03-21 ENCOUNTER — Ambulatory Visit: Payer: Medicare HMO

## 2021-03-21 ENCOUNTER — Encounter: Payer: Self-pay | Admitting: Physical Therapy

## 2021-03-21 ENCOUNTER — Other Ambulatory Visit: Payer: Self-pay

## 2021-03-21 ENCOUNTER — Ambulatory Visit: Payer: Medicare HMO | Admitting: Speech Pathology

## 2021-03-21 ENCOUNTER — Ambulatory Visit: Payer: Medicare HMO | Admitting: Physical Therapy

## 2021-03-21 DIAGNOSIS — R2681 Unsteadiness on feet: Secondary | ICD-10-CM | POA: Diagnosis not present

## 2021-03-21 DIAGNOSIS — R262 Difficulty in walking, not elsewhere classified: Secondary | ICD-10-CM

## 2021-03-21 DIAGNOSIS — R1312 Dysphagia, oropharyngeal phase: Secondary | ICD-10-CM | POA: Diagnosis not present

## 2021-03-21 DIAGNOSIS — R278 Other lack of coordination: Secondary | ICD-10-CM | POA: Diagnosis not present

## 2021-03-21 DIAGNOSIS — R2689 Other abnormalities of gait and mobility: Secondary | ICD-10-CM | POA: Diagnosis not present

## 2021-03-21 DIAGNOSIS — R41841 Cognitive communication deficit: Secondary | ICD-10-CM | POA: Diagnosis not present

## 2021-03-21 DIAGNOSIS — I63511 Cerebral infarction due to unspecified occlusion or stenosis of right middle cerebral artery: Secondary | ICD-10-CM

## 2021-03-21 DIAGNOSIS — M6281 Muscle weakness (generalized): Secondary | ICD-10-CM | POA: Diagnosis not present
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
## 2021-03-21 NOTE — Therapy (Signed)
[facility] MAIN [facility] SERVICES [address] [place], [place], [address] Phone: [phone]   Fax:  [phone]  Physical Therapy Treatment  Patient Details  Name: [place] [name] MRN: [id number] Date of Birth: 07/12/1956 Referring Provider (PT): Charlton Amor, PA-C   Encounter Date: 03/21/2021   PT End of Session - 03/21/21 1055     Visit Number 3    Number of Visits 25    Date for PT Re-Evaluation 06/04/21    Authorization Time Period Initial Certification 03/12/2021- 06/04/2021    PT Start Time 1100    PT Stop Time 1145    PT Time Calculation (min) 45 min    Equipment Utilized During Treatment Gait belt    Activity Tolerance Patient tolerated treatment well    Behavior During Therapy [facility] for tasks assessed/performed             History reviewed. No pertinent past medical history.  Past Surgical History:  Procedure Laterality Date   IR ANGIO INTRA EXTRACRAN SEL COM CAROTID INNOMINATE UNI L MOD SED  01/25/2021   IR CT HEAD LTD  01/25/2021   IR CT HEAD LTD  01/25/2021   IR PERCUTANEOUS ART THROMBECTOMY/INFUSION INTRACRANIAL INC DIAG ANGIO  01/25/2021   RADIOLOGY WITH ANESTHESIA N/A 01/24/2021   Procedure: IR WITH ANESTHESIA;  Surgeon: Julieanne Cotton, MD;  Location: [facility] OR;  Service: Radiology;  Laterality: N/A;    There were no vitals filed for this visit.   Subjective Assessment - 03/21/21 1059     Subjective Patient reports increased shoulder pain (R>L) which has been bothering him for a while. 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 Patient received Inpatient Rehab services and has unremarkable Past medical history.Hospital course complicated by acute hypoxic respiratory failure due to COVID-19 pneumonia possible aspiration.    Limitations Lifting;House hold activities    How long can you sit  comfortably? no limit    How long can you stand comfortably? 5 min    How long can you walk comfortably? Per son- able to walk 1/2-1 mile    Patient Stated Goals Get my arm working again    Currently in Pain? Yes    Pain Score 4     Pain Location Shoulder    Pain Orientation Right;Left    Pain Descriptors / Indicators Aching;Sore    Pain Type Chronic pain    Pain Onset 1 to 4 weeks ago    Pain Frequency Intermittent    Aggravating Factors  pushing up from chair    Pain Relieving Factors rest    Effect of Pain on Daily Activities decreased activity tolerance;    Multiple Pain Sites No                TREATMENT:   Patient instructed in advanced balance exercise Instructed patient in dynamic balance exercise: Resisted walking, 12.5# forward/backward, side/side x4 way, x2 laps each direction; required min A for safety and cues to improve weight shift especially with eccentric control for better balance control.   Standing in parallel bars:  Forward step ups from firm surface to 6 inch step with airex pad with 1-0 rail assist x10 reps each LE with CGA for safety and min VCs for sequencing and positioning;  Standing on airex pad: -heel raises unsupported x15 reps  with CGA for safety -Tandem stance unsupported 15 sec hold, progressed to lateral head turns side/side x5 reps with min A for safety and increased unsteadiness requiring increased ankle strategies for balance control;   Standing on airex pad: -picking up cones from 6 inch step #3 and then placing back, CGA to close supervision, no imbalance noted  Progressed to standing on rockerboard  Incline-   Feet apart eyes open 30 sec hold, eyes closed 15 sec hold unsupported with no sway noted  Progressed to picking up cones #3 and placing back on step with CGA for safety  Decline: Feet apart eyes open 30 sec hold, eyes closed 15 sec hold unsupported with increased sway having a harder time standing with eyes  closed;  Progressed to picking up cones #3 and placing back on step with CGA for safety   Ladder drills: Forward reciprocal gait x6 laps, initial speed 8 sec, progressed to 6.5 sec with cues working towards trying to be faster than 6 sec;  Out-out-in-in x4 laps with min VCs for sequencing and positioning;  Side stepping (2 feet per square) x3 laps each direction working on improving speed from initial time of 29 sec to 13-15 sec subsequent laps;  Required CGA for safety;    Patient tolerated session well. He continues to be limited with speed and incoordination. Patient had a harder time following instruction with coordination tasks during ladder drills. He did do well when standing on unlevel surface with minimal instability noted;                     PT Education - 03/21/21 1055     Education Details LE strengthening, balance/gait safety;    [name]    Methods Explanation;Verbal cues    Comprehension Verbalized understanding;Returned demonstration;Verbal cues required;Need further instruction              PT Short Term Goals - 03/12/21 2243       PT SHORT TERM GOAL #1   Title Pt will be independent with HEP in order to improve strength and balance in order to decrease fall risk and improve function at home and work.    Baseline 03/12/2021- Patient with no HEP in place    Time 6    Period Weeks    Status New    Target Date 04/23/21               PT Long Term Goals - 03/12/21 2245       PT LONG TERM GOAL #1   Title Pt will improve FOTO to target score to  75 to display perceived improvements in ability to complete ADL's.    Baseline 03/12/2021= 68/100    Time 12    Period Weeks    Status New    Target Date 06/04/21      PT LONG TERM GOAL #2   Title Pt will decrease 5TSTS by at least  5 seconds in order to demonstrate clinically significant improvement in LE strength.    Baseline 03/12/2021= 25.77 sec without UE support    Time 12     Period Weeks    Status New    Target Date 06/04/21      PT LONG TERM GOAL #3   Title Pt will decrease TUG to below 11 seconds/decrease in order to demonstrate decreased fall risk.    Baseline 03/12/2021= 13.58 sec with use of cane    Time 12    Period  Weeks    Status New    Target Date 06/04/21      PT LONG TERM GOAL #4   Title Pt will increase by at least 0.13 m/s in order to demonstrate clinically significant improvement in community ambulation.    Baseline 03/12/2021= 0.62 m/s with use of cane    Time 12    Period Weeks    Status New    Target Date 06/04/21                   Plan - 03/21/21 1334     Clinical Impression Statement Patient motivated and participated well within session. He is progressing well with balance tasks. He does require CGA to min A with advanced tasks such as narrow base of support on compliant surfaces. However he did very well picking items up while standing on incline/decline. Patient instructed in various ladder drills to challenge coordination and speed. He did require mod-max VCs for sequencing with coordination. Patient also continues to be limited with slower speed. Patient would benefit from additional skilled PT intervention to improve balance/gait safety for return to PLOF.    Examination-Activity Limitations Caring for [employer]    Examination-Participation Restrictions Cleaning;Community Activity;Driving;Laundry;Occupation;Yard Work    Stability/Clinical Decision Making Stable/Uncomplicated    Rehab Potential Good    PT Frequency 2x / week    PT Duration 12 weeks    PT Treatment/Interventions ADLs/Self Care Home Management;Cryotherapy;Moist Heat;Gait training;DME Instruction;Stair training;Therapeutic activities;Functional mobility training;Therapeutic exercise;Balance training;Neuromuscular re-education;Patient/family education;Manual techniques;Passive range of motion    PT Next Visit Plan Test 6  min walk test and balance testing - add appropriate goals    PT Home Exercise Plan To be initiated next 1-2 sessions    Consulted and Agree with Plan of Care Patient;Family member/caregiver    Family Member Consulted sister in law             Patient will benefit from skilled therapeutic intervention in order to improve the following deficits and impairments:  Decreased activity tolerance, Decreased endurance, Decreased mobility, Decreased range of motion, Decreased strength, Difficulty walking, Impaired perceived functional ability, Impaired UE functional use  Visit Diagnosis: Muscle weakness (generalized)  Other lack of coordination  Difficulty in walking, not elsewhere classified     Problem List Patient Active Problem List   Diagnosis Date Noted   Xerostomia    Anemia    Hemiparesis 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 (HCC)    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 SERVICES [address] [place], [place], [address] Phone: [phone]   Fax:  [phone]  Name: Jacaden Forbush Romaniello MRN: [id number] Date of Birth: Kleiman 15, 1957

## 2021-03-21 NOTE — Therapy (Signed)
[name] MAIN [facility] SERVICES [address] [name], [place], [address] Phone: [phone]   Fax:  [phone]  Speech Language Pathology Treatment  Patient Details  Name: Leonard [name] MRN: [id number] Date of Birth: 07/04/1956 No data recorded  Encounter Date: 03/21/2021   End of Session - 03/21/21 1543     Visit Number 3    Number of Visits 25    Date for SLP Re-Evaluation 06/13/21    Authorization Type [employer] [facility]    Authorization Time Period 16 visits auth 6/13-8/13    Authorization - Visit Number 3    Progress Note Due on Visit 10    SLP Start Time 1000    SLP Stop Time  1100    SLP Time Calculation (min) 60 min    Activity Tolerance Patient tolerated treatment well             No past medical history on file.  Past Surgical History:  Procedure Laterality Date   IR ANGIO INTRA EXTRACRAN SEL COM CAROTID INNOMINATE UNI L MOD SED  01/25/2021   IR CT HEAD LTD  01/25/2021   IR CT HEAD LTD  01/25/2021   IR PERCUTANEOUS ART THROMBECTOMY/INFUSION INTRACRANIAL INC DIAG ANGIO  01/25/2021   RADIOLOGY WITH ANESTHESIA N/A 01/24/2021   Procedure: IR WITH ANESTHESIA;  Surgeon: Julieanne Cotton, MD;  Location: [facility] OR;  Service: Radiology;  Laterality: N/A;    There were no vitals filed for this visit.   Subjective Assessment - 03/21/21 1531     Subjective Pt completed homework    Currently in Pain? Yes    Pain Score 4     Pain Location Shoulder                   ADULT SLP TREATMENT - 03/21/21 1532       General Information   Behavior/Cognition Alert;Cooperative;Other (comment)   flat affect   HPI 65 y.o. male with no pertinent past medical history, who presented to ED on 01/24/2021 via EMS as code stroke, acute onset of left facial droop, dysarthria and left hemineglect. MRI from 4/21 revealed large acute infarct of right MCA, involving frontal operculum, insula and basal ganglia. Resulting dysphagia, dysarthria, and cognitive  communication impairments. Had NG but progressed to dysphagia 2, thin liquids by time of d/c from CIR (5/13-5/26/22).      Treatment Provided   Treatment provided Cognitive-Linquistic      Cognitive-Linquistic Treatment   Treatment focused on Cognition;Patient/family/caregiver education    Skilled Treatment Patient got out homework after son placed binder on table. Pt calculations were 95% correct; he had self-corrected 2/4 errors. Simple orientation using weekly calendar. Pt recalled activity from previous day (lunch with friends) but had not written specific details on his calendar ("lunch"). With min cues pt generated a more specific description (location and friends names). Targeted sustained attention, visual scanning, recall, and impulsivity in simple cognitive-linguistic tasks. Delayed recall of 3 images was 50% overall from F: 8. Most frequently missed images on the left side of the page. Cued pt to use strategy of verbalizing each word, and this increased accuracy to 80%, however as task progressed (~6 minutes) pt required cuing for sustained attention. For mod complex categorization task, patient required usual cues for slower rate/ awareness of impulsivity; 80% accuracy (more errors due to sustained attention in final 2 minutes of task). Set up home program on pt's iPad for tasks at home. Patient was given blank weekly planner  worksheet with instructions to fill in dates for next session.      Assessment / Recommendations / Plan   Plan Continue with current plan of care      Progression Toward Goals   Progression toward goals Progressing toward goals              SLP Education - 03/21/21 1542     Education Details impulsivity negatively impacts accuracy    [name]    Methods Explanation;Demonstration    Comprehension Verbalized understanding;Need further instruction              SLP Short Term Goals - 03/15/21 1545       SLP SHORT TERM GOAL #1    Title Patient will demonstrate intellectual awareness by telling SLP 3 cognitive deficits.    Time 10    Period --   sessions   Status New      SLP SHORT TERM GOAL #2   Title Patient will maintain selective attention (internal or external distractions) to functional tasks for 10 minutes with no more than 2 redirections to task.    Time 10    Period --   sessions   Status New      SLP SHORT TERM GOAL #3   Title Patient will establish external aid for memory/executive function and bring to more than 75% of therapy sessions.    Time 10    Period --   sessions   Status New      SLP SHORT TERM GOAL #4   Title pt will demonstrate aspiration precautions with recommended POs x3 sessions with no overt s/sx aspiration    Time 10    Period --   sessions   Status New              SLP Long Term Goals - 03/15/21 1550       SLP LONG TERM GOAL #1   Title Patient will demonstrate anticipatory awareness by identifying cognitive-communication barriers and using appropriate compensatory strategies.    Time 12    Period Weeks    Status New    Target Date 06/13/21      SLP LONG TERM GOAL #2   Title Patient will demonstrate divided attention with appropriate use of compensatory strategies for 15 minutes.    Time 12    Period Weeks    Status New    Target Date 09/05/23      SLP LONG TERM GOAL #3   Title pt will demo [facility] skills in solving mod complex problems in WNL amount of time with modified independence (double checking answers , etc)    Time 12    Period Weeks    Status New    Target Date 06/13/21      SLP LONG TERM GOAL #4   Title Pt will use external aids to manage schedule, appointments, and medical information independently.    Time 12    Period Weeks    Status New    Target Date 06/13/21              Plan - 03/21/21 1543     Clinical Impression Statement Patient is a 65 y.o. male who presents with overall moderate cognitive impairments and mild oral dysphagia.  Deficits seen today in attention, recall, and executive function skills. Patient remains impulsive but less so than during evaluation. Patient is receptive to teaching and error confrontation and makes efforts to adjust his performance with cues. Patient  hopes to return to independent lifestyle by managing medication, managing finances for shop/business and completing household chores. Patient will benefit from skilled SLP services to address above impairments and improve overall function.    Speech Therapy Frequency 2x / week    Duration 12 weeks    Treatment/Interventions Aspiration precaution training;Environmental controls;Cueing hierarchy;SLP instruction and feedback;Compensatory techniques;Cognitive reorganization;Functional tasks;Compensatory strategies;Diet toleration management by SLP;Trials of upgraded texture/liquids;Internal/external aids;Multimodal communcation approach;Patient/family education    Potential to Achieve Goals Good    SLP Home Exercise Plan provided; see pt instructions for details    Consulted and Agree with Plan of Care Patient             Patient will benefit from skilled therapeutic intervention in order to improve the following deficits and impairments:   Cognitive communication deficit  Right middle cerebral artery stroke [facility])    Problem List Patient Active Problem List   Diagnosis Date Noted   Xerostomia    Anemia    Hemiparesis 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 (HCC)    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 [place], [place], [address] Phone:  [phone]   Fax:  [phone]   Name: Romelle Reiley Dunklee MRN: [id number] Date of Birth: 05/05/1956

## 2021-03-23 ENCOUNTER — Ambulatory Visit: Payer: Medicare HMO

## 2021-03-23 ENCOUNTER — Ambulatory Visit: Payer: Medicare HMO | Admitting: Occupational Therapy

## 2021-03-26 ENCOUNTER — Ambulatory Visit: Payer: Medicare HMO | Admitting: Occupational Therapy

## 2021-03-26 ENCOUNTER — Ambulatory Visit: Payer: Medicare HMO | Admitting: Speech Pathology

## 2021-03-26 ENCOUNTER — Encounter: Payer: Self-pay | Admitting: Occupational Therapy

## 2021-03-26 ENCOUNTER — Other Ambulatory Visit: Payer: Self-pay

## 2021-03-26 ENCOUNTER — Ambulatory Visit: Payer: Medicare HMO

## 2021-03-26 DIAGNOSIS — M6281 Muscle weakness (generalized): Secondary | ICD-10-CM | POA: Diagnosis not present

## 2021-03-26 DIAGNOSIS — R41841 Cognitive communication deficit: Secondary | ICD-10-CM | POA: Diagnosis not present

## 2021-03-26 DIAGNOSIS — I63511 Cerebral infarction due to unspecified occlusion or stenosis of right middle cerebral artery: Secondary | ICD-10-CM | POA: Diagnosis not present

## 2021-03-26 DIAGNOSIS — R2681 Unsteadiness on feet: Secondary | ICD-10-CM | POA: Diagnosis not present

## 2021-03-26 DIAGNOSIS — R262 Difficulty in walking, not elsewhere classified: Secondary | ICD-10-CM

## 2021-03-26 DIAGNOSIS — R1312 Dysphagia, oropharyngeal phase: Secondary | ICD-10-CM | POA: Diagnosis not present

## 2021-03-26 DIAGNOSIS — R278 Other lack of coordination: Secondary | ICD-10-CM

## 2021-03-26 DIAGNOSIS — R2689 Other abnormalities of gait and mobility: Secondary | ICD-10-CM | POA: Diagnosis not present
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
## 2021-03-26 NOTE — Therapy (Signed)
[place] [facility] MAIN [facility] SERVICES [address] [place], [place], [address] Phone: [phone]   Fax:  [phone]  Physical Therapy Treatment  Patient Details  Name: Leonard [name] MRN: [id number] Date of Birth: November 07, 1955 Referring Provider (PT): Charlton Amor, PA-C   Encounter Date: 03/26/2021   PT End of Session - 03/26/21 0753     Visit Number 4    Number of Visits 25    Date for PT Re-Evaluation 06/04/21    Authorization Time Period Initial Certification 03/12/2021- 06/04/2021    PT Start Time 0759    PT Stop Time 0844    PT Time Calculation (min) 45 min    Equipment Utilized During Treatment Gait belt    Activity Tolerance Patient tolerated treatment well    Behavior During Therapy [facility] for tasks assessed/performed             History reviewed. No pertinent past medical history.  Past Surgical History:  Procedure Laterality Date   IR ANGIO INTRA EXTRACRAN SEL COM CAROTID INNOMINATE UNI L MOD SED  01/25/2021   IR CT HEAD LTD  01/25/2021   IR CT HEAD LTD  01/25/2021   IR PERCUTANEOUS ART THROMBECTOMY/INFUSION INTRACRANIAL INC DIAG ANGIO  01/25/2021   RADIOLOGY WITH ANESTHESIA N/A 01/24/2021   Procedure: IR WITH ANESTHESIA;  Surgeon: Julieanne Cotton, MD;  Location: [facility] OR;  Service: Radiology;  Laterality: N/A;    There were no vitals filed for this visit.   Subjective Assessment - 03/26/21 0802     Subjective Patient presents with wife. Reports one trip up since last session, tripped over blanket on couch.    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 Patient received Inpatient Rehab services and has unremarkable Past medical history.Hospital course complicated by acute hypoxic respiratory failure due to COVID-19 pneumonia possible aspiration.    Limitations Lifting;House hold activities    How long can you sit comfortably? no limit    How  long can you stand comfortably? 5 min    How long can you walk comfortably? Per son- able to walk 1/2-1 mile    Patient Stated Goals Get my arm working again    Currently in Pain? Yes    Pain Score 5     Pain Location Shoulder    Pain Orientation Right;Left    Pain Descriptors / Indicators Aching    Pain Type Chronic pain    Pain Onset 1 to 4 weeks ago    Pain Frequency Intermittent              TREATMENT:      In hallway:  Reading cards with horizontal head turns 4x 86 ft; frequent L foot drag requiring max cueing   Step over orange hurdle and back 12x each LE  Standing on airex pad: -heel raises unsupported x15 reps with CGA for safety -Tandem stance unsupported 15 sec hold, progressed to lateral head turns side/side x5 reps with min A for safety and increased unsteadiness requiring increased ankle strategies for balance control;    Standing on airex pad: -cone taps with finger tip support 8x each LE   Bosu ball: -modified lunge 10x forward each LE -modified lateral lunge 10x each LE      Ladder drills: Forward reciprocal gait with cues for heel strike one foot each square 4x length of // bars Out-out-in-in  x4 laps with min VCs for sequencing and positioning; Side stepping (2 feet per square) x6 laps each direction working on keeping neutral alignment and not turning when stepping with LLE.  Required CGA for safety;    Seated: Contralateral UE/LE raises; very challenging for patient orientation x 3 minutes  10x STS with arms crossed no LOB     Patient requires intermittent seated rest breaks due to fatigue throughout physical therapy session. He is highly motivated throughout physical therapy session. He is challenged with dual tasks with increased L foot drag as well as movements to his including side steps and diagonals. Patient would benefit from additional skilled PT intervention to improve balance/gait safety for return to  [facility]                      PT Education - 03/26/21 0752     Education Details exercise technique, body mechanics    [name]    Methods Explanation;Demonstration;Tactile cues;Verbal cues    Comprehension Verbalized understanding;Returned demonstration;Verbal cues required;Tactile cues required              PT Short Term Goals - 03/12/21 2243       PT SHORT TERM GOAL #1   Title Pt will be independent with HEP in order to improve strength and balance in order to decrease fall risk and improve function at home and work.    Baseline 03/12/2021- Patient with no HEP in place    Time 6    Period Weeks    Status New    Target Date 04/23/21               PT Long Term Goals - 03/12/21 2245       PT LONG TERM GOAL #1   Title Pt will improve FOTO to target score to  75 to display perceived improvements in ability to complete ADL's.    Baseline 03/12/2021= 68/100    Time 12    Period Weeks    Status New    Target Date 06/04/21      PT LONG TERM GOAL #2   Title Pt will decrease 5TSTS by at least  5 seconds in order to demonstrate clinically significant improvement in LE strength.    Baseline 03/12/2021= 25.77 sec without UE support    Time 12    Period Weeks    Status New    Target Date 06/04/21      PT LONG TERM GOAL #3   Title Pt will decrease TUG to below 11 seconds/decrease in order to demonstrate decreased fall risk.    Baseline 03/12/2021= 13.58 sec with use of cane    Time 12    Period Weeks    Status New    Target Date 06/04/21      PT LONG TERM GOAL #4   Title Pt will increase by at least 0.13 m/s in order to demonstrate clinically significant improvement in community ambulation.    Baseline 03/12/2021= 0.62 m/s with use of cane    Time 12    Period Weeks    Status New    Target Date 06/04/21                   Plan - 03/26/21 0919     Clinical Impression Statement Patient requires intermittent seated rest  breaks due to fatigue throughout physical therapy session. He is highly motivated throughout physical therapy session. He is challenged with dual  tasks with increased L foot drag as well as movements to his including side steps and diagonals. Patient would benefit from additional skilled PT intervention to improve balance/gait safety for return to PLOF    Examination-Activity Limitations Caring for Others;Carry;Dressing;Lift;Reach Overhead    Examination-Participation Restrictions Cleaning;Community Activity;Driving;Laundry;Occupation;Yard Work    Stability/Clinical Decision Making Stable/Uncomplicated    Rehab Potential Good    PT Frequency 2x / week    PT Duration 12 weeks    PT Treatment/Interventions ADLs/Self Care Home Management;Cryotherapy;Moist Heat;Gait training;DME Instruction;Stair training;Therapeutic activities;Functional mobility training;Therapeutic exercise;Balance training;Neuromuscular re-education;Patient/family education;Manual techniques;Passive range of motion    PT Next Visit Plan Test 6 min walk test and balance testing - add appropriate goals    PT Home Exercise Plan To be initiated next 1-2 sessions    Consulted and Agree with Plan of Care Patient;Family member/caregiver    Family Member Consulted sister in law             Patient will benefit from skilled therapeutic intervention in order to improve the following deficits and impairments:  Decreased activity tolerance, Decreased endurance, Decreased mobility, Decreased range of motion, Decreased strength, Difficulty walking, Impaired perceived functional ability, Impaired UE functional use  Visit Diagnosis: Muscle weakness (generalized)  Other lack of coordination  Difficulty in walking, not elsewhere classified     Problem List Patient Active Problem List   Diagnosis Date Noted   Xerostomia    Anemia    Hemiparesis 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 (HCC)    Right middle cerebral artery  stroke (HCC) 02/15/2021   Hypertension    Tachypnea    Leukocytosis    Acute blood loss anemia    Dysphagia, post-stroke    Stroke (cerebrum) (HCC) 01/25/2021   Middle cerebral artery embolism, right 01/25/2021   Precious Bard, PT, DPT   03/26/2021, 9:20 AM  [place] [facility] MAIN [facility] SERVICES [address] [place], [place], [address] Phone: [phone]   Fax:  [phone]  Name: Greig Altergott Dedrick MRN: [id number] Date of Birth: May 16, 1956

## 2021-03-27 ENCOUNTER — Encounter: Payer: Self-pay | Admitting: Speech Pathology

## 2021-03-27 NOTE — Therapy (Signed)
[place] [facility] MAIN [facility] SERVICES [address] [place], [place], [address] Phone: [phone]   Fax:  [phone]  Speech Language Pathology Treatment  Patient Details  Name: Leonard [name] MRN: [id number] Date of Birth: [birth year] No data recorded  Encounter Date: 03/26/2021   End of Session - 03/27/21 1501     Visit Number 4    Number of Visits 25    Date for SLP Re-Evaluation 06/13/21    Authorization Type [employer] [facility]    Authorization Time Period 16 visits auth 6/13-8/13    Authorization - Visit Number 4    Progress Note Due on Visit 10    SLP Start Time 1000    SLP Stop Time  1100    SLP Time Calculation (min) 60 min    Activity Tolerance Patient tolerated treatment well             History reviewed. No pertinent past medical history.  Past Surgical History:  Procedure Laterality Date   IR ANGIO INTRA EXTRACRAN SEL COM CAROTID INNOMINATE UNI L MOD SED  01/25/2021   IR CT HEAD LTD  01/25/2021   IR CT HEAD LTD  01/25/2021   IR PERCUTANEOUS ART THROMBECTOMY/INFUSION INTRACRANIAL INC DIAG ANGIO  01/25/2021   RADIOLOGY WITH ANESTHESIA N/A 01/24/2021   Procedure: IR WITH ANESTHESIA;  Surgeon: Julieanne Cotton, MD;  [facility]: [facility] OR;  Service: Radiology;  Laterality: N/A;    There were no vitals filed for this visit.   Subjective Assessment - 03/27/21 1459     Subjective Pt completed homework                   ADULT SLP TREATMENT - 03/27/21 0001       General Information   Behavior/Cognition Alert;Cooperative;Other (comment)   flat affect   HPI 65 y.o. male with no pertinent past medical history, who presented to ED on 01/24/2021 via EMS as code stroke, acute onset of left facial droop, dysarthria and left hemineglect. MRI from 4/21 revealed large acute infarct of right MCA, involving frontal operculum, insula and basal ganglia. Resulting dysphagia, dysarthria, and cognitive communication impairments. Had NG but progressed to  dysphagia 2, thin liquids by time of d/c from CIR (5/13-5/26/22).      Treatment Provided   Treatment provided Cognitive-Linquistic      Cognitive-Linquistic Treatment   Treatment focused on Cognition;Patient/family/caregiver education    Skilled Treatment Simple orientation using weekly calendar. Pt did not add information regarding previous events, with min cues pt generated a more specific description (location and friends names). Targeted sustained attention, visual scanning, recall, and impulsivity in simple cognitive-linguistic tasks. Completed following written following directions activity with 80% accuracy independently, required mod-max cues to identify errors and repair.  Completed figuring elapsed time worksheet with 20% accuracy independently, requiring mod-max cues to use strategies effectively.  Completed word change worksheet given min-mod cues to stay on task and maintain instructions for activity.      Assessment / Recommendations / Plan   Plan Continue with current plan of care      Progression Toward Goals   Progression toward goals Progressing toward goals              SLP Education - 03/27/21 1500     Education Details ask questions when you are not sure what to do    [name];Spouse    Methods Explanation    Comprehension Verbalized understanding  SLP Short Term Goals - 03/15/21 1545       SLP SHORT TERM GOAL #1   Title Patient will demonstrate intellectual awareness by telling SLP 3 cognitive deficits.    Time 10    Period --   sessions   Status New      SLP SHORT TERM GOAL #2   Title Patient will maintain selective attention (internal or external distractions) to functional tasks for 10 minutes with no more than 2 redirections to task.    Time 10    Period --   sessions   Status New      SLP SHORT TERM GOAL #3   Title Patient will establish external aid for memory/executive function and bring to more than 75% of  therapy sessions.    Time 10    Period --   sessions   Status New      SLP SHORT TERM GOAL #4   Title pt will demonstrate aspiration precautions with recommended POs x3 sessions with no overt s/sx aspiration    Time 10    Period --   sessions   Status New              SLP Long Term Goals - 03/15/21 1550       SLP LONG TERM GOAL #1   Title Patient will demonstrate anticipatory awareness by identifying cognitive-communication barriers and using appropriate compensatory strategies.    Time 12    Period Weeks    Status New    Target Date 06/13/21      SLP LONG TERM GOAL #2   Title Patient will demonstrate divided attention with appropriate use of compensatory strategies for 15 minutes.    Time 12    Period Weeks    Status New    Target Date 09/05/23      SLP LONG TERM GOAL #3   Title pt will demo [facility] skills in solving mod complex problems in WNL amount of time with modified independence (double checking answers , etc)    Time 12    Period Weeks    Status New    Target Date 06/13/21      SLP LONG TERM GOAL #4   Title Pt will use external aids to manage schedule, appointments, and medical information independently.    Time 12    Period Weeks    Status New    Target Date 06/13/21              Plan - 03/27/21 1501     Clinical Impression Statement Patient is a 65 y.o. male who presents with overall moderate cognitive impairments and mild oral dysphagia. Deficits seen today in attention, recall, and executive function skills. Patient remains impulsive but less so than during evaluation. Patient is receptive to teaching and error confrontation and makes efforts to adjust his performance with cues. Patient hopes to return to independent lifestyle by managing medication, managing finances for shop/business and completing household chores. Patient will benefit from skilled SLP services to address above impairments and improve overall function.    Speech Therapy Frequency  2x / week    Duration 12 weeks    Treatment/Interventions Aspiration precaution training;Environmental controls;Cueing hierarchy;SLP instruction and feedback;Compensatory techniques;Cognitive reorganization;Functional tasks;Compensatory strategies;Diet toleration management by SLP;Trials of upgraded texture/liquids;Internal/external aids;Multimodal communcation approach;Patient/family education    Potential to Achieve Goals Good    Potential Considerations Ability to learn/carryover information;Severity of impairments;Cooperation/participation level;Previous level of function    Consulted and Agree with Plan  of Care Patient             Patient will benefit from skilled therapeutic intervention in order to improve the following deficits and impairments:   Cognitive communication deficit    Problem List Patient Active Problem List   Diagnosis Date Noted   Xerostomia    Anemia    Hemiparesis 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 (HCC)    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

## 2021-03-28 ENCOUNTER — Ambulatory Visit: Payer: Medicare HMO | Admitting: Speech Pathology

## 2021-03-28 ENCOUNTER — Encounter: Payer: Self-pay | Admitting: Speech Pathology

## 2021-03-28 ENCOUNTER — Ambulatory Visit: Payer: Medicare HMO

## 2021-03-28 ENCOUNTER — Other Ambulatory Visit: Payer: Self-pay

## 2021-03-28 DIAGNOSIS — R2681 Unsteadiness on feet: Secondary | ICD-10-CM

## 2021-03-28 DIAGNOSIS — R41841 Cognitive communication deficit: Secondary | ICD-10-CM | POA: Diagnosis not present

## 2021-03-28 DIAGNOSIS — R262 Difficulty in walking, not elsewhere classified: Secondary | ICD-10-CM | POA: Diagnosis not present

## 2021-03-28 DIAGNOSIS — I63511 Cerebral infarction due to unspecified occlusion or stenosis of right middle cerebral artery: Secondary | ICD-10-CM | POA: Diagnosis not present

## 2021-03-28 DIAGNOSIS — R2689 Other abnormalities of gait and mobility: Secondary | ICD-10-CM

## 2021-03-28 DIAGNOSIS — R278 Other lack of coordination: Secondary | ICD-10-CM | POA: Diagnosis not present

## 2021-03-28 DIAGNOSIS — M6281 Muscle weakness (generalized): Secondary | ICD-10-CM | POA: Diagnosis not present

## 2021-03-28 DIAGNOSIS — R1312 Dysphagia, oropharyngeal phase: Secondary | ICD-10-CM | POA: Diagnosis not present

## 2021-03-28 NOTE — Therapy (Signed)
[facility] MAIN [facility] SERVICES [address] [place], [place], [address] Phone: [phone]   Fax:  [phone]  Speech Language Pathology Treatment  Patient Details  Name: Leonard [name] MRN: [id number] Date of Birth: [birth year] No data recorded  Encounter Date: 03/28/2021   End of Session - 03/28/21 1303     Visit Number 5    Number of Visits 25    Date for SLP Re-Evaluation 06/13/21    Authorization Type [facility]    Authorization Time Period 16 visits auth 6/13-8/13    Authorization - Visit Number 5    Progress Note Due on Visit 10    SLP Start Time 1000    SLP Stop Time  1100    SLP Time Calculation (min) 60 min    Activity Tolerance Patient tolerated treatment well             History reviewed. No pertinent past medical history.  Past Surgical History:  Procedure Laterality Date   IR ANGIO INTRA EXTRACRAN SEL COM CAROTID INNOMINATE UNI L MOD SED  01/25/2021   IR CT HEAD LTD  01/25/2021   IR CT HEAD LTD  01/25/2021   IR PERCUTANEOUS ART THROMBECTOMY/INFUSION INTRACRANIAL INC DIAG ANGIO  01/25/2021   RADIOLOGY WITH ANESTHESIA N/A 01/24/2021   Procedure: IR WITH ANESTHESIA;  Surgeon: Julieanne Cotton, MD;  Location: [facility] OR;  Service: Radiology;  Laterality: N/A;    There were no vitals filed for this visit.   Subjective Assessment - 03/28/21 1302     Subjective Pt completed homework    Patient is accompained by: Family member                   ADULT SLP TREATMENT - 03/28/21 0001       General Information   Behavior/Cognition Alert;Cooperative;Other (comment)   flat affect   HPI 65 y.o. male with no pertinent past medical history, who presented to ED on 01/24/2021 via EMS as code stroke, acute onset of left facial droop, dysarthria and left hemineglect. MRI from 4/21 revealed large acute infarct of right MCA, involving frontal operculum, insula and basal ganglia. Resulting dysphagia, dysarthria, and cognitive  communication impairments. Had NG but progressed to dysphagia 2, thin liquids by time of d/c from CIR (5/13-5/26/22).      Treatment Provided   Treatment provided Cognitive-Linquistic      Pain Assessment   Pain Assessment No/denies pain      Cognitive-Linquistic Treatment   Treatment focused on Cognition;Patient/family/caregiver education    Skilled Treatment Simple orientation using weekly calendar. Pt did not add information regarding previous events, with min cues pt generated a more specific description (location and friends names). Targeted sustained attention, visual scanning, recall, and impulsivity in simple cognitive-linguistic tasks. Completed organizing/categorization activity with 80% accuracy independently, required mod-max cues to identify errors and repair.  Completed figuring elapsed time worksheet with 20% accuracy independently, requiring mod-max cues to use strategies effectively for remainder of problems.  Completed word change worksheet given min cues to stay on task and maintain instructions for activity.      Assessment / Recommendations / Plan   Plan Continue with current plan of care      Progression Toward Goals   Progression toward goals Progressing toward goals              SLP Education - 03/28/21 1303     Education Details ask questions when you are not sure what to do  [name];Child(ren)    Methods Explanation    Comprehension Verbalized understanding              SLP Short Term Goals - 03/15/21 1545       SLP SHORT TERM GOAL #1   Title Patient will demonstrate intellectual awareness by telling SLP 3 cognitive deficits.    Time 10    Period --   sessions   Status New      SLP SHORT TERM GOAL #2   Title Patient will maintain selective attention (internal or external distractions) to functional tasks for 10 minutes with no more than 2 redirections to task.    Time 10    Period --   sessions   Status New      SLP  SHORT TERM GOAL #3   Title Patient will establish external aid for memory/executive function and bring to more than 75% of therapy sessions.    Time 10    Period --   sessions   Status New      SLP SHORT TERM GOAL #4   Title pt will demonstrate aspiration precautions with recommended POs x3 sessions with no overt s/sx aspiration    Time 10    Period --   sessions   Status New              SLP Long Term Goals - 03/15/21 1550       SLP LONG TERM GOAL #1   Title Patient will demonstrate anticipatory awareness by identifying cognitive-communication barriers and using appropriate compensatory strategies.    Time 12    Period Weeks    Status New    Target Date 06/13/21      SLP LONG TERM GOAL #2   Title Patient will demonstrate divided attention with appropriate use of compensatory strategies for 15 minutes.    Time 12    Period Weeks    Status New    Target Date 09/05/23      SLP LONG TERM GOAL #3   Title pt will demo [facility] skills in solving mod complex problems in WNL amount of time with modified independence (double checking answers , etc)    Time 12    Period Weeks    Status New    Target Date 06/13/21      SLP LONG TERM GOAL #4   Title Pt will use external aids to manage schedule, appointments, and medical information independently.    Time 12    Period Weeks    Status New    Target Date 06/13/21              Plan - 03/28/21 1304     Clinical Impression Statement Patient is a 65 y.o. male who presents with overall moderate cognitive impairments and mild oral dysphagia. Deficits seen today in attention, recall, and executive function skills. Patient remains impulsive but less so than during evaluation. Patient is receptive to teaching and error confrontation and makes efforts to adjust his performance with cues. Patient hopes to return to independent lifestyle by managing medication, managing finances for shop/business and completing household chores. Patient will  benefit from skilled SLP services to address above impairments and improve overall function.    Speech Therapy Frequency 2x / week    Duration 12 weeks    Treatment/Interventions Aspiration precaution training;Environmental controls;Cueing hierarchy;SLP instruction and feedback;Compensatory techniques;Cognitive reorganization;Functional tasks;Compensatory strategies;Diet toleration management by SLP;Trials of upgraded texture/liquids;Internal/external aids;Multimodal communcation approach;Patient/family education    Potential  to Achieve Goals Good    Potential Considerations Ability to learn/carryover information;Severity of impairments;Cooperation/participation level;Previous level of function    SLP Home Exercise Plan provided    Consulted and Agree with Plan of Care Patient;Family member/caregiver    Family Member Consulted son             Patient will benefit from skilled therapeutic intervention in order to improve the following deficits and impairments:   Cognitive communication deficit    Problem List Patient Active Problem List   Diagnosis Date Noted   Xerostomia    Anemia    Hemiparesis 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 (HCC)    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

## 2021-03-28 NOTE — Therapy (Signed)
[facility] MAIN [facility] SERVICES [address] [place], [place], [address] Phone: [phone]   Fax:  [phone]  Physical Therapy Treatment  Patient Details  Name: Leonard [name] MRN: [id number] Date of Birth: Aug 24, 1956 Referring Provider (PT): Charlton Amor, PA-C   Encounter Date: 03/28/2021   PT End of Session - 03/28/21 1246     Visit Number 5    Number of Visits 25    Date for PT Re-Evaluation 06/04/21    Authorization Time Period Initial Certification 03/12/2021- 06/04/2021    PT Start Time 1108    PT Stop Time 1151    PT Time Calculation (min) 43 min    Equipment Utilized During Treatment Gait belt    Activity Tolerance Patient tolerated treatment well    Behavior During Therapy [facility] for tasks assessed/performed             History reviewed. No pertinent past medical history.  Past Surgical History:  Procedure Laterality Date   IR ANGIO INTRA EXTRACRAN SEL COM CAROTID INNOMINATE UNI L MOD SED  01/25/2021   IR CT HEAD LTD  01/25/2021   IR CT HEAD LTD  01/25/2021   IR PERCUTANEOUS ART THROMBECTOMY/INFUSION INTRACRANIAL INC DIAG ANGIO  01/25/2021   RADIOLOGY WITH ANESTHESIA N/A 01/24/2021   Procedure: IR WITH ANESTHESIA;  Surgeon: Julieanne Cotton, MD;  Location: [facility] OR;  Service: Radiology;  Laterality: N/A;    There were no vitals filed for this visit.    Subjective Assessment - 03/28/21 1107     Subjective Pt reports 5/10 B shoulder pain. Pt reports going to dr next week. Pt reports no falls or near falls since last visit.    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 Patient received Inpatient Rehab services and has unremarkable Past medical history.Hospital course complicated by acute hypoxic respiratory failure due to COVID-19 pneumonia possible aspiration.    Limitations Lifting;House hold activities    How long can you sit  comfortably? no limit    How long can you stand comfortably? 5 min    How long can you walk comfortably? Per son- able to walk 1/2-1 mile    Patient Stated Goals Get my arm working again    Currently in Pain? Yes    Pain Score 5     Pain Onset 1 to 4 weeks ago             Treatment  Neuro Re-education-   In hallway:  Reading cards with horizontal head turns 4x down hallway; Cuing for pt to attend to L side     Step over orange hurdle forward/backward - 2x15 each LE; cuing to lead with LLE, avoid circumduction and increase step-length Side-stepping over orange hurdle - 2x15. Cuing for increased step-length.   Standing on airex pad: CGA-min assist provided throughout d/t decreased postural stability  -turns side/side, emphasis on turning to L side, passing cone - x multiple reps. -heel raises unsupported x15 reps WBOS, x15 NBOS with CGA for safety -Tandem stance unsupported 30 sec hold 2 times,   - semi-tandem stance with vertical and horizontal head turns 2x10 each direction   Standing on airex pad: -2 cones, toe-taps with finger tip support to no UE support 12x each LE   Bosu ball: -modified lunge 10x forward each LE with decreasing levels of UE support, frequent cuing for technique -  modified lateral lunge 10x each LE, continued use of frequent cuing for technique   SLS - 4x30 sec each LE; intermittent UE support  Pt educated throughout session about proper posture and technique with exercises. Improved exercise technique, movement at target joints, use of target muscles after min to mod verbal, visual, tactile cues.     PT Short Term Goals - 03/12/21 2243       PT SHORT TERM GOAL #1   Title Pt will be independent with HEP in order to improve strength and balance in order to decrease fall risk and improve function at home and work.    Baseline 03/12/2021- Patient with no HEP in place    Time 6    Period Weeks    Status New    Target Date 04/23/21                PT Long Term Goals - 03/12/21 2245       PT LONG TERM GOAL #1   Title Pt will improve FOTO to target score to  75 to display perceived improvements in ability to complete ADL's.    Baseline 03/12/2021= 68/100    Time 12    Period Weeks    Status New    Target Date 06/04/21      PT LONG TERM GOAL #2   Title Pt will decrease 5TSTS by at least  5 seconds in order to demonstrate clinically significant improvement in LE strength.    Baseline 03/12/2021= 25.77 sec without UE support    Time 12    Period Weeks    Status New    Target Date 06/04/21      PT LONG TERM GOAL #3   Title Pt will decrease TUG to below 11 seconds/decrease in order to demonstrate decreased fall risk.    Baseline 03/12/2021= 13.58 sec with use of cane    Time 12    Period Weeks    Status New    Target Date 06/04/21      PT LONG TERM GOAL #4   Title Pt will increase by at least 0.13 m/s in order to demonstrate clinically significant improvement in community ambulation.    Baseline 03/12/2021= 0.62 m/s with use of cane    Time 12    Period Weeks    Status New    Target Date 06/04/21                   Plan - 03/28/21 1247     Clinical Impression Statement Pt requires increased cuing to attend to L side during balance activities, particularly with reading cards while ambulating down hallway with horizontal head turns. Pt was able to improve scanning of L side within session. The pt will benefit from further skilled PT to improve balance and gait/safety to return pt to PLOF.    Examination-Activity Limitations Caring for [employer]    Examination-Participation Restrictions Cleaning;Community Activity;Driving;Laundry;Occupation;Yard Work    Stability/Clinical Decision Making Stable/Uncomplicated    Rehab Potential Good    PT Frequency 2x / week    PT Duration 12 weeks    PT Treatment/Interventions ADLs/Self Care Home Management;Cryotherapy;Moist Heat;Gait  training;DME Instruction;Stair training;Therapeutic activities;Functional mobility training;Therapeutic exercise;Balance training;Neuromuscular re-education;Patient/family education;Manual techniques;Passive range of motion    PT Next Visit Plan Test 6 min walk test and balance testing - add appropriate goals    PT Home Exercise Plan To be initiated next 1-2 sessions    Consulted and Agree with Plan  of Care Patient;Family member/caregiver    Family Member Consulted sister in law             Patient will benefit from skilled therapeutic intervention in order to improve the following deficits and impairments:  Decreased activity tolerance, Decreased endurance, Decreased mobility, Decreased range of motion, Decreased strength, Difficulty walking, Impaired perceived functional ability, Impaired UE functional use  Visit Diagnosis: Unsteadiness on feet  Other abnormalities of gait and mobility     Problem List Patient Active Problem List   Diagnosis Date Noted   Xerostomia    Anemia    Hemiparesis 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 (HCC)    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 [place], [place], [address] Phone: [phone]   Fax:  [phone]  Name: Leonard [name] MRN: [id number] Date of Birth: [birth year]

## 2021-03-30 ENCOUNTER — Other Ambulatory Visit: Payer: Self-pay | Admitting: Physical Medicine & Rehabilitation

## 2021-03-30 ENCOUNTER — Other Ambulatory Visit: Payer: Self-pay

## 2021-03-30 ENCOUNTER — Ambulatory Visit: Payer: Medicare HMO

## 2021-03-30 ENCOUNTER — Telehealth: Payer: Self-pay | Admitting: Physical Medicine & Rehabilitation

## 2021-03-30 DIAGNOSIS — R278 Other lack of coordination: Secondary | ICD-10-CM

## 2021-03-30 DIAGNOSIS — R41841 Cognitive communication deficit: Secondary | ICD-10-CM | POA: Diagnosis not present

## 2021-03-30 DIAGNOSIS — R2689 Other abnormalities of gait and mobility: Secondary | ICD-10-CM | POA: Diagnosis not present

## 2021-03-30 DIAGNOSIS — R1312 Dysphagia, oropharyngeal phase: Secondary | ICD-10-CM | POA: Diagnosis not present

## 2021-03-30 DIAGNOSIS — R262 Difficulty in walking, not elsewhere classified: Secondary | ICD-10-CM | POA: Diagnosis not present

## 2021-03-30 DIAGNOSIS — R2681 Unsteadiness on feet: Secondary | ICD-10-CM | POA: Diagnosis not present

## 2021-03-30 DIAGNOSIS — I63511 Cerebral infarction due to unspecified occlusion or stenosis of right middle cerebral artery: Secondary | ICD-10-CM | POA: Diagnosis not present

## 2021-03-30 DIAGNOSIS — M6281 Muscle weakness (generalized): Secondary | ICD-10-CM | POA: Diagnosis not present

## 2021-03-30 NOTE — Telephone Encounter (Signed)
Notified Dr Wynn Banker refilled and Dr Pearlean Brownie should be the one to do further refills.
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
## 2021-03-30 NOTE — Telephone Encounter (Signed)
Wife called requesting a refill on medication Brilinta.  Patient only has 2 pills left.  Please call her at [phone].

## 2021-04-02 ENCOUNTER — Ambulatory Visit: Payer: Medicare HMO

## 2021-04-02 ENCOUNTER — Ambulatory Visit: Payer: Medicare HMO | Admitting: Speech Pathology

## 2021-04-02 ENCOUNTER — Other Ambulatory Visit: Payer: Self-pay

## 2021-04-02 DIAGNOSIS — R262 Difficulty in walking, not elsewhere classified: Secondary | ICD-10-CM | POA: Diagnosis not present

## 2021-04-02 DIAGNOSIS — R1312 Dysphagia, oropharyngeal phase: Secondary | ICD-10-CM | POA: Diagnosis not present

## 2021-04-02 DIAGNOSIS — R41841 Cognitive communication deficit: Secondary | ICD-10-CM

## 2021-04-02 DIAGNOSIS — R2689 Other abnormalities of gait and mobility: Secondary | ICD-10-CM | POA: Diagnosis not present

## 2021-04-02 DIAGNOSIS — R278 Other lack of coordination: Secondary | ICD-10-CM

## 2021-04-02 DIAGNOSIS — R2681 Unsteadiness on feet: Secondary | ICD-10-CM | POA: Diagnosis not present

## 2021-04-02 DIAGNOSIS — I63511 Cerebral infarction due to unspecified occlusion or stenosis of right middle cerebral artery: Secondary | ICD-10-CM | POA: Diagnosis not present

## 2021-04-02 DIAGNOSIS — M6281 Muscle weakness (generalized): Secondary | ICD-10-CM | POA: Diagnosis not present

## 2021-04-02 NOTE — Therapy (Signed)
[place] [facility] MAIN [facility] SERVICES [address] [place], [place], [address] Phone: [phone]   Fax:  [phone]  Physical Therapy Treatment  Patient Details  Name: Leonard [name] MRN: [id number] Date of Birth: [birth year] Referring Provider (PT): Charlton Amor, PA-C   Encounter Date: 04/02/2021   PT End of Session - 04/02/21 1409     Visit Number 6    Number of Visits 25    Date for PT Re-Evaluation 06/04/21    Authorization Time Period Initial Certification 03/12/2021- 06/04/2021    PT Start Time 1414    PT Stop Time 1459    PT Time Calculation (min) 45 min    Equipment Utilized During Treatment Gait belt    Activity Tolerance Patient tolerated treatment well    Behavior During Therapy [facility] for tasks assessed/performed             History reviewed. No pertinent past medical history.  Past Surgical History:  Procedure Laterality Date   IR ANGIO INTRA EXTRACRAN SEL COM CAROTID INNOMINATE UNI L MOD SED  01/25/2021   IR CT HEAD LTD  01/25/2021   IR CT HEAD LTD  01/25/2021   IR PERCUTANEOUS ART THROMBECTOMY/INFUSION INTRACRANIAL INC DIAG ANGIO  01/25/2021   RADIOLOGY WITH ANESTHESIA N/A 01/24/2021   Procedure: IR WITH ANESTHESIA;  Surgeon: Julieanne Cotton, MD;  Location: [facility] OR;  Service: Radiology;  Laterality: N/A;    There were no vitals filed for this visit.   Subjective Assessment - 04/02/21 1505     Subjective Patient presents with [place]. [place] and was able to negotiate a large hill without LOB.    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 Patient received Inpatient Rehab services and has unremarkable Past medical history.Hospital course complicated by acute hypoxic respiratory failure due to COVID-19 pneumonia possible aspiration.    Limitations Lifting;House hold activities    How long can you sit  comfortably? no limit    How long can you stand comfortably? 5 min    How long can you walk comfortably? Per son- able to walk 1/2-1 mile    Patient Stated Goals Get my arm working again    Currently in Pain? Yes    Pain Score 3     Pain Location Shoulder    Pain Orientation Left;Right    Pain Descriptors / Indicators Sore    Pain Type Chronic pain    Pain Onset 1 to 4 weeks ago    Pain Frequency Intermittent                   Treatment   Neuro Re-education-    In hallway:   Reading cards with horizontal head turns 4x down hallway; Cuing for pt to attend to L side      Six cones placed around gym on L and R side to have patient turn head horizontally and vertically to scan room and pick up cone x 86 ft    Standing on airex pad: CGA-min assist provided throughout d/t decreased postural stability -Rainbow ball forward press 10x, lateral twist 10x, straight arm modified raise 10x   Airex balance beam:  -lateral stepping 6x length no UE support -tandem walking 6x length with no UE support  -static stand, reach forward and grab a ball and toss into basket  x 15 balls, one near LOB   Bosu ball: -modified lunge 10x forward each LE with decreasing levels of UE support, frequent cuing for technique -modified lateral lunge 10x each LE, continued use of frequent cuing for technique -static stand 30 seconds; heavy cueing required   12" step: step up/down with SUE support 10x each LE    Pt educated throughout session about proper posture and technique with exercises. Improved exercise technique, movement at target joints, use of target muscles after min to mod verbal, visual, tactile cues.   If patient continues current progression he will not need much more therapy at this time. He is progressing with functional stability and mobility with decreased ankle instability and decreased need for external cues. Safety is mainly affected by cognition at this time. Patient would  benefit from additional skilled PT intervention to improve balance/gait safety for return to [facility]                 PT Education - 04/02/21 1408     Education Details exercise technique, body mechanics    [name]    Methods Explanation;Demonstration;Tactile cues;Verbal cues    Comprehension Verbalized understanding;Returned demonstration;Verbal cues required;Tactile cues required              PT Short Term Goals - 03/12/21 2243       PT SHORT TERM GOAL #1   Title Pt will be independent with HEP in order to improve strength and balance in order to decrease fall risk and improve function at home and work.    Baseline 03/12/2021- Patient with no HEP in place    Time 6    Period Weeks    Status New    Target Date 04/23/21               PT Long Term Goals - 03/12/21 2245       PT LONG TERM GOAL #1   Title Pt will improve FOTO to target score to  75 to display perceived improvements in ability to complete ADL's.    Baseline 03/12/2021= 68/100    Time 12    Period Weeks    Status New    Target Date 06/04/21      PT LONG TERM GOAL #2   Title Pt will decrease 5TSTS by at least  5 seconds in order to demonstrate clinically significant improvement in LE strength.    Baseline 03/12/2021= 25.77 sec without UE support    Time 12    Period Weeks    Status New    Target Date 06/04/21      PT LONG TERM GOAL #3   Title Pt will decrease TUG to below 11 seconds/decrease in order to demonstrate decreased fall risk.    Baseline 03/12/2021= 13.58 sec with use of cane    Time 12    Period Weeks    Status New    Target Date 06/04/21      PT LONG TERM GOAL #4   Title Pt will increase by at least 0.13 m/s in order to demonstrate clinically significant improvement in community ambulation.    Baseline 03/12/2021= 0.62 m/s with use of cane    Time 12    Period Weeks    Status New    Target Date 06/04/21                   Plan - 04/02/21 1506      Clinical Impression Statement If patient  continues current progression he will not need much more therapy at this time. He is progressing with functional stability and mobility with decreased ankle instability and decreased need for external cues. Safety is mainly affected by cognition at this time. Patient would benefit from additional skilled PT intervention to improve balance/gait safety for return to PLOF    Examination-Activity Limitations Caring for Others;Carry;Dressing;Lift;Reach Overhead    Examination-Participation Restrictions Cleaning;Community Activity;Driving;Laundry;Occupation;Yard Work    Stability/Clinical Decision Making Stable/Uncomplicated    Rehab Potential Good    PT Frequency 2x / week    PT Duration 12 weeks    PT Treatment/Interventions ADLs/Self Care Home Management;Cryotherapy;Moist Heat;Gait training;DME Instruction;Stair training;Therapeutic activities;Functional mobility training;Therapeutic exercise;Balance training;Neuromuscular re-education;Patient/family education;Manual techniques;Passive range of motion    PT Next Visit Plan Test 6 min walk test and balance testing - add appropriate goals    PT Home Exercise Plan To be initiated next 1-2 sessions    Consulted and Agree with Plan of Care Patient;Family member/caregiver    Family Member Consulted sister in law             Patient will benefit from skilled therapeutic intervention in order to improve the following deficits and impairments:  Decreased activity tolerance, Decreased endurance, Decreased mobility, Decreased range of motion, Decreased strength, Difficulty walking, Impaired perceived functional ability, Impaired UE functional use  Visit Diagnosis: Muscle weakness (generalized)  Other lack of coordination  Unsteadiness on feet     Problem List Patient Active Problem List   Diagnosis Date Noted   Xerostomia    Anemia    Hemiparesis 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 (HCC)     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

## 2021-04-03 ENCOUNTER — Encounter: Payer: Medicare HMO | Attending: Physical Medicine & Rehabilitation | Admitting: Physical Medicine & Rehabilitation

## 2021-04-03 ENCOUNTER — Encounter: Payer: Self-pay | Admitting: Physical Medicine & Rehabilitation

## 2021-04-03 VITALS — BP 186/91 | HR 74 | Temp 98.2°F | Ht 70.0 in | Wt 170.0 lb

## 2021-04-03 DIAGNOSIS — I63511 Cerebral infarction due to unspecified occlusion or stenosis of right middle cerebral artery: Secondary | ICD-10-CM | POA: Insufficient documentation

## 2021-04-03 DIAGNOSIS — I69354 Hemiplegia and hemiparesis following cerebral infarction affecting left non-dominant side: Secondary | ICD-10-CM

## 2021-04-03 NOTE — Therapy (Signed)
[facility] MAIN [facility] SERVICES [address] [place], [place], [address] Phone: [phone]   Fax:  [phone]  Speech Language Pathology Treatment  Patient Details  Name: Leonard [name] MRN: [id number] Date of Birth: July 04, 1956 No data recorded  Encounter Date: 04/02/2021   End of Session - 04/03/21 1102     Visit Number 6    Number of Visits 25    Date for SLP Re-Evaluation 06/13/21    Authorization Type [employer] [facility]    Authorization Time Period 16 visits auth [id number]    Authorization - Visit Number 6    Progress Note Due on Visit 10    SLP Start Time 1500    SLP Stop Time  1600    SLP Time Calculation (min) 60 min    Activity Tolerance Patient tolerated treatment well             No past medical history on file.  Past Surgical History:  Procedure Laterality Date   IR ANGIO INTRA EXTRACRAN SEL COM CAROTID INNOMINATE UNI L MOD SED  01/25/2021   IR CT HEAD LTD  01/25/2021   IR CT HEAD LTD  01/25/2021   IR PERCUTANEOUS ART THROMBECTOMY/INFUSION INTRACRANIAL INC DIAG ANGIO  01/25/2021   RADIOLOGY WITH ANESTHESIA N/A 01/24/2021   Procedure: IR WITH ANESTHESIA;  Surgeon: Julieanne Cotton, MD;  Location: [facility] OR;  Service: Radiology;  Laterality: N/A;    There were no vitals filed for this visit.   Subjective Assessment - 04/03/21 1043     Subjective "You're the warden."    Currently in Pain? Yes    Pain Score 3     Pain Location Shoulder                   ADULT SLP TREATMENT - 04/03/21 1043       General Information   Behavior/Cognition Alert;Cooperative;Other (comment)   flat affect   HPI 65 y.o. male with no pertinent past medical history, who presented to ED on 01/24/2021 via EMS as code stroke, acute onset of left facial droop, dysarthria and left hemineglect. MRI from 4/21 revealed large acute infarct of right MCA, involving frontal operculum, insula and basal ganglia. Resulting dysphagia, dysarthria, and cognitive  communication impairments. Had NG but progressed to dysphagia 2, thin liquids by time of d/c from CIR (5/13-5/26/22).      Cognitive-Linquistic Treatment   Treatment focused on Cognition;Patient/family/caregiver education    Skilled Treatment ORIENTATION/RECALL: Patient filled in calendar for homework. Continues to require cues to add more specific information intermittently and to add more details about upcoming events/plans. Required moderate cues for problem solving to determine the current date. DELAYED RECALL: SLP educated on memory strategies (WARM) and trained patient in using these strategies during simple cognitive linguistic tasks. Delayed recall of 4 words overall 67% accuracy, however when using strategy of repetition (mod cues), accuracy increased to 80%. EXECUTIVE FUNCTION/RECALL: Patient required moderate cues to use memory strategies (repetition/writing) to recall multi-step directions and moderate cues for planning routes according to verbal instructions. ATTENTION/VISUAL SCANNING: Patient required occasional cues to repeat instructions and attend to details when scanning menu for functional information. As task progressed, patient began initiating repeats independently.      Assessment / Recommendations / Plan   Plan Continue with current plan of care      Progression Toward Goals   Progression toward goals Progressing toward goals  SLP Education - 04/03/21 1101     Education Details WARM strategies    [name];Child(ren)    Methods Explanation    Comprehension Verbalized understanding;Need further instruction;Returned demonstration              SLP Short Term Goals - 03/15/21 1545       SLP SHORT TERM GOAL #1   Title Patient will demonstrate intellectual awareness by telling SLP 3 cognitive deficits.    Time 10    Period --   sessions   Status New      SLP SHORT TERM GOAL #2   Title Patient will maintain selective attention  (internal or external distractions) to functional tasks for 10 minutes with no more than 2 redirections to task.    Time 10    Period --   sessions   Status New      SLP SHORT TERM GOAL #3   Title Patient will establish external aid for memory/executive function and bring to more than 75% of therapy sessions.    Time 10    Period --   sessions   Status New      SLP SHORT TERM GOAL #4   Title pt will demonstrate aspiration precautions with recommended POs x3 sessions with no overt s/sx aspiration    Time 10    Period --   sessions   Status New              SLP Long Term Goals - 03/15/21 1550       SLP LONG TERM GOAL #1   Title Patient will demonstrate anticipatory awareness by identifying cognitive-communication barriers and using appropriate compensatory strategies.    Time 12    Period Weeks    Status New    Target Date 06/13/21      SLP LONG TERM GOAL #2   Title Patient will demonstrate divided attention with appropriate use of compensatory strategies for 15 minutes.    Time 12    Period Weeks    Status New    Target Date 09/05/23      SLP LONG TERM GOAL #3   Title pt will demo [facility] skills in solving mod complex problems in WNL amount of time with modified independence (double checking answers , etc)    Time 12    Period Weeks    Status New    Target Date 06/13/21      SLP LONG TERM GOAL #4   Title Pt will use external aids to manage schedule, appointments, and medical information independently.    Time 12    Period Weeks    Status New    Target Date 06/13/21              Plan - 04/03/21 1103     Clinical Impression Statement Patient is a 65 y.o. male who presents with overall moderate cognitive impairments and mild oral dysphagia. Deficits seen today in attention, recall, and executive function skills. Patient was receptive to teaching re: memory strategies and began initiating repetition of instructions when he was aware he had missed details. Patient  is receptive to teaching and error confrontation and makes efforts to adjust his performance with cues. Patient hopes to return to independent lifestyle by managing medication, managing finances for shop/business and completing household chores. Patient will benefit from skilled SLP services to address above impairments and improve overall function.    Speech Therapy Frequency 2x / week    Duration 12  weeks    Treatment/Interventions Aspiration precaution training;Environmental controls;Cueing hierarchy;SLP instruction and feedback;Compensatory techniques;Cognitive reorganization;Functional tasks;Compensatory strategies;Diet toleration management by SLP;Trials of upgraded texture/liquids;Internal/external aids;Multimodal communcation approach;Patient/family education    Potential to Achieve Goals Good    Potential Considerations Ability to learn/carryover information;Severity of impairments;Cooperation/participation level;Previous level of function    SLP Home Exercise Plan provided    Consulted and Agree with Plan of Care Patient;Family member/caregiver    Family Member Consulted son             Patient will benefit from skilled therapeutic intervention in order to improve the following deficits and impairments:   Cognitive communication deficit  Right middle cerebral artery stroke [facility])    Problem List Patient Active Problem List   Diagnosis Date Noted   Xerostomia    Anemia    Hemiparesis 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 (HCC)    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

## 2021-04-03 NOTE — Patient Instructions (Addendum)
Continue Aspirin and Brillinta Continue atorvastatin  Uddin stop Tamsulosin and inhaler   Blood pressure still up please call for Primary care appt ASAP Please measure blood pressure daily , goal is <140/90

## 2021-04-03 NOTE — Progress Notes (Signed)
Subjective:    Patient ID: Leonard [name], male    DOB: 08/24/1956, 65 y.o.   MRN: [id number] 65 y.o. right-handed male with unremarkable past medical history.  Lives with spouse independent prior to admission.  1 level home 4 steps to entry.  Presented 01/24/2021 with acute onset of left-sided weakness dysarthria and facial droop.  Noted blood pressure severely elevated to 32/124.  Cranial CT scan showed acute right MCA territory infarction without hemorrhagic or mass-effect.  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  Patient underwent revascularization per interventional radiology.  Follow-up MRI 01/25/2021 showed a large acute infarction right MCA territory involving the frontal operculum insula and basal ganglia.  No hemorrhage or mass-effect.  Punctate foci of acute ischemia in the right temporal lobe and right thalamus.  MRA was unremarkable.  Echocardiogram with ejection fraction of 60 to 65% no wall motion abnormalities.  Currently maintained on aspirin for CVA prophylaxis as well as Brilinta.  Hospital course complicated by dysphagia initially n.p.o. diet slowly advanced dysphagia #2 nectar thick liquids.  Cleviprex for blood pressure control.  Hospital course complicated by acute hypoxic respiratory failure due to COVID-19 pneumonia possible aspiration.  Hypoxia resolved treated with steroids/remdesivir/Actemra and broad-spectrum antimicrobial therapy.  Noted he was unvaccinated.  Contact infectious disease no need for isolation from 02/14/2021 onwards.  He did experience some urinary retention with Foley tube inserted placed on Flomax.  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 Patient would follow-up neurology services as well as interventional radiology.  Contact made in regards to recommendations for 30-day cardiac event monitor as outpatient. Initially on subcutaneous heparin for DVT prophylaxis hold if platelets less than 50,000. Patient remained on aspirin and Brilinta for CVA prophylaxis. Patient remained on Lipitor for hyperlipidemia. Blood pressure currently controlled on no antihypertensive medications. Initial bouts of urinary retention Foley tube removed maintained on Flomax voiding without difficulty  Admit date: 02/15/2021 Discharge date: 03/01/2021 HPI The patient returns today he has been undergoing outpatient PT OT and speech therapy at [facility].  He feels like he is making good progress with his ambulation but is left upper extremity function has not improved much yet.  His swallowing has improved to normal. The patient lives with his wife he is able to dress and bathe himself. His primary complaint today is bilateral shoulder pain.  He actually points to his upper trapezius area.  He has no neck pain or radiating pain shooting down the arms. He is concerned it could be his statin medication.  He does not have any pain in his thighs hips or buttocks area  We discussed the patient's elevated blood pressure today and recommend repeat.  Also the patient has not established with primary care but plans to get an appointment at the [facility] primary care practice.  I discussed that they should call to day. The patient still has his aspirin Brilinta and is taking this regularly he is still taking his Lipitor 80 mg/day He is bladder is doing fine he did not take Flomax prior to his hospitalization only during his hospitalization when he developed some urinary retention.  We discussed that he should be able to stop this and to restart only if he has some voiding difficulties. He also was given an inhaler  after his COVID pneumonia however he no longer needs this and has not been using it.  Pain Inventory Average Pain 5 Pain Right Now  5 My pain is aching  LOCATION OF PAIN  shoulder   BOWEL Number of stools per week: 7 Oral laxative use Yes  Type of laxative senna Enema or suppository use No  History of colostomy No  Incontinent No   BLADDER Normal In and out cath, frequency  Able to self cath  n/a Bladder incontinence No  Frequent urination No  Leakage with coughing No  Difficulty starting stream No  Incomplete bladder emptying No    Mobility walk with assistance use a cane ability to climb steps?  yes do you drive?  no  Function retired  Neuro/Psych No problems in this area  Prior Studies Hospital f/u  Physicians involved in your care Hospital f/u   No family history on file. Social History   Socioeconomic History   Marital status: Married    Spouse name: Leonard [name]   Number of children: Not on file   Years of education: Not on file   Highest education level: Not on file  Occupational History   Not on file  Tobacco Use   Smoking status: Never   Smokeless tobacco: Never  Vaping Use   Vaping Use: Never used  Substance and Sexual Activity   Alcohol use: Never   Drug use: Never   Sexual activity: Never  Other Topics Concern   Not on file  Social History Narrative   Not on file   Social Determinants of Health   Financial Resource Strain: Not on file  Food Insecurity: Not on file  Transportation Needs: Not on file  Physical Activity: Not on file  Stress: Not on file  Social Connections: Not on file   Past Surgical History:  Procedure Laterality Date   IR ANGIO INTRA EXTRACRAN SEL COM CAROTID INNOMINATE UNI L MOD SED  01/25/2021   IR CT HEAD LTD  01/25/2021   IR CT HEAD LTD  01/25/2021   IR PERCUTANEOUS ART THROMBECTOMY/INFUSION INTRACRANIAL INC DIAG ANGIO  01/25/2021   RADIOLOGY WITH ANESTHESIA N/A 01/24/2021   Procedure: IR WITH ANESTHESIA;  Surgeon: Julieanne Cotton, MD;  Location: [facility] OR;  Service: Radiology;  Laterality: N/A;   History reviewed. No pertinent past  medical history. Temp 98.2 F (36.8 C)   Ht 5\' 10"  (1.778 m)   Wt 170 lb (77.1 kg)   BMI 24.39 kg/m   Opioid Risk Score:   Fall Risk Score:  `1  Depression screen PHQ 2/9  No flowsheet data found.  Review of Systems  Constitutional: Negative.   HENT: Negative.    Eyes: Negative.   Respiratory: Negative.    Cardiovascular: Negative.   Gastrointestinal: Negative.   Endocrine: Negative.   Genitourinary: Negative.   Musculoskeletal:  Positive for arthralgias.  Skin: Negative.   Allergic/Immunologic: Negative.   Neurological: Negative.   Hematological: Negative.   Psychiatric/Behavioral: Negative.    All other systems reviewed and are negative.     Objective:   Physical Exam Vitals and nursing note reviewed.  Constitutional:      Appearance: He is normal weight.  HENT:     Head: Normocephalic and atraumatic.  Eyes:     Extraocular Movements: Extraocular movements intact.     Conjunctiva/sclera: Conjunctivae normal.     Pupils: Pupils are equal, round, and reactive to light.  Cardiovascular:     Rate and Rhythm: Normal rate and regular  rhythm.     Heart sounds: Normal heart sounds.  Pulmonary:     Effort: Pulmonary effort is normal. No respiratory distress.     Breath sounds: Normal breath sounds.  Abdominal:     General: Abdomen is flat. Bowel sounds are normal. There is no distension.     Palpations: Abdomen is soft.  Musculoskeletal:     Cervical back: Normal range of motion and neck supple.  Neurological:     Mental Status: He is alert and oriented to person, place, and time.     Gait: Gait normal.     Comments: Motor strength is 5/5 in the right deltoid, bicep, tricep, grip, hip flexor, knee extensor ankle dorsiflexor To minus left deltoid 3 - bicep to minus tricep trace finger flexion and wrist flexion 0 at the wrist and finger extensors on the left side Left lower extremity 5/5 hip flexor knee extensor ankle dorsiflexor  Sensation patient reports has  being equal bilateral upper limbs and lower limbs. Tone is reduced in the finger and wrist flexors on the left side.  Ambulates without assistive device no evidence of toe drag or knee instability   Psychiatric:        Mood and Affect: Mood normal.        Behavior: Behavior normal.    There is mild tenderness bilateral upper traps Negative Leanord Asal bilaterally One half finger breaths shoulder subluxation on the left side not on the right No pain with external rotation bilaterally No tenderness over the deltoids gluteus muscles or quadriceps.      Assessment & Plan:  1.  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  He does have some edema dorsum of the hand secondary to weakness.  We will continue with outpatient OT, retrograde massage, neuromuscular reeducation continue Brilinta and aspirin as well as atorvastatin, follow-up with neurology has appointment with Dr. Pearlean Brownie in August Needs primary care, wife said she would call to make appointment Agree with tapering down on outpatient PT given that he has made good recovery in this regard 2.  Bilateral shoulder pain this is really is more of a trapezius myofascial pain.  I do not think it is related to his atorvastatin given that it is isolated those muscles.  We discussed trigger point injections which she declines.  He Gish do well with dry needling which can be done in physical therapy. He states he does not like needles Continue atorvastatin 80 mg a day Follow-up physical medicine rehab in 3 months

## 2021-04-04 ENCOUNTER — Other Ambulatory Visit: Payer: Self-pay

## 2021-04-04 ENCOUNTER — Ambulatory Visit (INDEPENDENT_AMBULATORY_CARE_PROVIDER_SITE_OTHER): Payer: Medicare HMO | Admitting: Nurse Practitioner

## 2021-04-04 ENCOUNTER — Encounter: Payer: Self-pay | Admitting: Nurse Practitioner

## 2021-04-04 ENCOUNTER — Ambulatory Visit: Payer: Medicare HMO

## 2021-04-04 ENCOUNTER — Ambulatory Visit: Payer: Medicare HMO | Admitting: Speech Pathology

## 2021-04-04 VITALS — BP 175/90 | HR 92 | Temp 98.4°F | Resp 16 | Ht 70.0 in | Wt 169.8 lb

## 2021-04-04 DIAGNOSIS — I700 Atherosclerosis of aorta: Secondary | ICD-10-CM | POA: Diagnosis not present

## 2021-04-04 DIAGNOSIS — I63511 Cerebral infarction due to unspecified occlusion or stenosis of right middle cerebral artery: Secondary | ICD-10-CM | POA: Diagnosis not present

## 2021-04-04 DIAGNOSIS — I10 Essential (primary) hypertension: Secondary | ICD-10-CM

## 2021-04-04 DIAGNOSIS — R278 Other lack of coordination: Secondary | ICD-10-CM

## 2021-04-04 DIAGNOSIS — R41841 Cognitive communication deficit: Secondary | ICD-10-CM | POA: Diagnosis not present

## 2021-04-04 DIAGNOSIS — R1312 Dysphagia, oropharyngeal phase: Secondary | ICD-10-CM | POA: Diagnosis not present

## 2021-04-04 DIAGNOSIS — Z1211 Encounter for screening for malignant neoplasm of colon: Secondary | ICD-10-CM | POA: Diagnosis not present

## 2021-04-04 DIAGNOSIS — R262 Difficulty in walking, not elsewhere classified: Secondary | ICD-10-CM | POA: Diagnosis not present

## 2021-04-04 DIAGNOSIS — Z125 Encounter for screening for malignant neoplasm of prostate: Secondary | ICD-10-CM

## 2021-04-04 DIAGNOSIS — M6281 Muscle weakness (generalized): Secondary | ICD-10-CM | POA: Diagnosis not present

## 2021-04-04 DIAGNOSIS — Z1212 Encounter for screening for malignant neoplasm of rectum: Secondary | ICD-10-CM | POA: Diagnosis not present

## 2021-04-04 DIAGNOSIS — Z0189 Encounter for other specified special examinations: Secondary | ICD-10-CM | POA: Diagnosis not present

## 2021-04-04 DIAGNOSIS — Z7689 Persons encountering health services in other specified circumstances: Secondary | ICD-10-CM

## 2021-04-04 DIAGNOSIS — R2689 Other abnormalities of gait and mobility: Secondary | ICD-10-CM | POA: Diagnosis not present

## 2021-04-04 DIAGNOSIS — K219 Gastro-esophageal reflux disease without esophagitis: Secondary | ICD-10-CM

## 2021-04-04 DIAGNOSIS — R2681 Unsteadiness on feet: Secondary | ICD-10-CM | POA: Diagnosis not present

## 2021-04-04 MED ORDER — ASCORBIC ACID 500 MG PO TABS: 500.0000 mg | ORAL_TABLET | Freq: Every day | ORAL | 0 refills | Status: DC

## 2021-04-04 MED ORDER — ZINC SULFATE 220 (50 ZN) MG PO CAPS: 220.0000 mg | ORAL_CAPSULE | Freq: Every day | ORAL | 0 refills | Status: DC

## 2021-04-04 MED ORDER — AMLODIPINE BESYLATE 5 MG PO TABS
5.0000 mg | ORAL_TABLET | Freq: Every day | ORAL | 0 refills | Status: DC
Start: 2021-04-04 — End: 2021-05-01

## 2021-04-04 MED ORDER — PANTOPRAZOLE SODIUM 40 MG PO TBEC: 40.0000 mg | DELAYED_RELEASE_TABLET | Freq: Every day | ORAL | 0 refills | Status: DC
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
## 2021-04-05 DIAGNOSIS — Z0189 Encounter for other specified special examinations: Secondary | ICD-10-CM | POA: Diagnosis not present

## 2021-04-06 ENCOUNTER — Other Ambulatory Visit: Payer: Self-pay

## 2021-04-06 ENCOUNTER — Ambulatory Visit: Payer: Medicare HMO | Attending: Physician Assistant

## 2021-04-06 ENCOUNTER — Ambulatory Visit: Payer: Medicare HMO

## 2021-04-06 DIAGNOSIS — I6601 Occlusion and stenosis of right middle cerebral artery: Secondary | ICD-10-CM | POA: Insufficient documentation

## 2021-04-06 DIAGNOSIS — R262 Difficulty in walking, not elsewhere classified: Secondary | ICD-10-CM | POA: Insufficient documentation

## 2021-04-06 DIAGNOSIS — R269 Unspecified abnormalities of gait and mobility: Secondary | ICD-10-CM | POA: Diagnosis not present

## 2021-04-06 DIAGNOSIS — R2689 Other abnormalities of gait and mobility: Secondary | ICD-10-CM

## 2021-04-06 DIAGNOSIS — R41841 Cognitive communication deficit: Secondary | ICD-10-CM | POA: Diagnosis not present

## 2021-04-06 DIAGNOSIS — R278 Other lack of coordination: Secondary | ICD-10-CM

## 2021-04-06 DIAGNOSIS — M6281 Muscle weakness (generalized): Secondary | ICD-10-CM | POA: Diagnosis not present

## 2021-04-06 DIAGNOSIS — R2681 Unsteadiness on feet: Secondary | ICD-10-CM | POA: Diagnosis not present

## 2021-04-06 DIAGNOSIS — I63511 Cerebral infarction due to unspecified occlusion or stenosis of right middle cerebral artery: Secondary | ICD-10-CM

## 2021-04-06 DIAGNOSIS — R1312 Dysphagia, oropharyngeal phase: Secondary | ICD-10-CM | POA: Diagnosis not present

## 2021-04-06 LAB — CMP14+EGFR
ALT: 24 IU/L (ref 0–44)
AST: 21 IU/L (ref 0–40)
Albumin/Globulin Ratio: 2 (ref 1.2–2.2)
Albumin: 4.5 g/dL (ref 3.8–4.8)
Alkaline Phosphatase: 106 IU/L (ref 44–121)
BUN/Creatinine Ratio: 9 — ABNORMAL LOW (ref 10–24)
BUN: 7 mg/dL — ABNORMAL LOW (ref 8–27)
Bilirubin Total: 0.8 mg/dL (ref 0.0–1.2)
CO2: 21 mmol/L (ref 20–29)
Calcium: 9.7 mg/dL (ref 8.6–10.2)
Chloride: 103 mmol/L (ref 96–106)
Creatinine, Ser: 0.81 mg/dL (ref 0.76–1.27)
Globulin, Total: 2.3 g/dL (ref 1.5–4.5)
Glucose: 94 mg/dL (ref 65–99)
Potassium: 4.4 mmol/L (ref 3.5–5.2)
Sodium: 141 mmol/L (ref 134–144)
Total Protein: 6.8 g/dL (ref 6.0–8.5)
eGFR: 98 mL/min/{1.73_m2} (ref >59)

## 2021-04-06 LAB — CBC WITH DIFFERENTIAL/PLATELET
Basophils Absolute: 0 10*3/uL (ref 0.0–0.2)
Basos: 1 %
EOS (ABSOLUTE): 0.4 10*3/uL (ref 0.0–0.4)
Eos: 5 %
Hematocrit: 42.2 % (ref 37.5–51.0)
Hemoglobin: 13.4 g/dL (ref 13.0–17.7)
Immature Grans (Abs): 0 10*3/uL (ref 0.0–0.1)
Immature Granulocytes: 0 %
Lymphocytes Absolute: 1.9 10*3/uL (ref 0.7–3.1)
Lymphs: 25 %
MCH: 28.7 pg (ref 26.6–33.0)
MCHC: 31.8 g/dL (ref 31.5–35.7)
MCV: 90 fL (ref 79–97)
Monocytes Absolute: 0.9 10*3/uL (ref 0.1–0.9)
Monocytes: 11 %
Neutrophils Absolute: 4.6 10*3/uL (ref 1.4–7.0)
Neutrophils: 58 %
Platelets: 329 10*3/uL (ref 150–450)
RBC: 4.67 x10E6/uL (ref 4.14–5.80)
RDW: 13.2 % (ref 11.6–15.4)
WBC: 7.8 10*3/uL (ref 3.4–10.8)

## 2021-04-06 LAB — LIPID PANEL
Chol/HDL Ratio: 5.2 ratio — ABNORMAL HIGH (ref 0.0–5.0)
Cholesterol, Total: 171 mg/dL (ref 100–199)
HDL: 33 mg/dL — ABNORMAL LOW (ref >39)
LDL Chol Calc (NIH): 107 mg/dL — ABNORMAL HIGH (ref 0–99)
Triglycerides: 173 mg/dL — ABNORMAL HIGH (ref 0–149)
VLDL Cholesterol Cal: 31 mg/dL (ref 5–40)

## 2021-04-06 LAB — PSA, TOTAL AND FREE
PSA, Free Pct: 54 %
PSA, Free: 0.27 ng/mL
Prostate Specific Ag, Serum: 0.5 ng/mL (ref 0.0–4.0)
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
## 2021-04-10 ENCOUNTER — Ambulatory Visit: Payer: Medicare HMO | Admitting: Speech Pathology

## 2021-04-10 ENCOUNTER — Other Ambulatory Visit: Payer: Self-pay

## 2021-04-10 ENCOUNTER — Ambulatory Visit: Payer: Medicare HMO

## 2021-04-10 DIAGNOSIS — R278 Other lack of coordination: Secondary | ICD-10-CM

## 2021-04-10 DIAGNOSIS — R262 Difficulty in walking, not elsewhere classified: Secondary | ICD-10-CM | POA: Diagnosis not present

## 2021-04-10 DIAGNOSIS — R41841 Cognitive communication deficit: Secondary | ICD-10-CM

## 2021-04-10 DIAGNOSIS — I63511 Cerebral infarction due to unspecified occlusion or stenosis of right middle cerebral artery: Secondary | ICD-10-CM

## 2021-04-10 DIAGNOSIS — R1312 Dysphagia, oropharyngeal phase: Secondary | ICD-10-CM

## 2021-04-10 DIAGNOSIS — R2689 Other abnormalities of gait and mobility: Secondary | ICD-10-CM | POA: Diagnosis not present

## 2021-04-10 DIAGNOSIS — M6281 Muscle weakness (generalized): Secondary | ICD-10-CM | POA: Diagnosis not present

## 2021-04-10 DIAGNOSIS — R269 Unspecified abnormalities of gait and mobility: Secondary | ICD-10-CM | POA: Diagnosis not present

## 2021-04-10 DIAGNOSIS — R2681 Unsteadiness on feet: Secondary | ICD-10-CM | POA: Diagnosis not present
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
## 2021-04-12 ENCOUNTER — Encounter: Payer: Self-pay | Admitting: Occupational Therapy

## 2021-04-12 ENCOUNTER — Telehealth: Payer: Self-pay

## 2021-04-12 ENCOUNTER — Ambulatory Visit: Payer: Medicare HMO

## 2021-04-12 ENCOUNTER — Ambulatory Visit: Payer: Medicare HMO | Admitting: Speech Pathology

## 2021-04-12 ENCOUNTER — Other Ambulatory Visit: Payer: Self-pay

## 2021-04-12 ENCOUNTER — Encounter: Payer: Self-pay | Admitting: Nurse Practitioner

## 2021-04-12 ENCOUNTER — Ambulatory Visit: Payer: Medicare HMO | Admitting: Occupational Therapy

## 2021-04-12 DIAGNOSIS — M6281 Muscle weakness (generalized): Secondary | ICD-10-CM

## 2021-04-12 DIAGNOSIS — R1312 Dysphagia, oropharyngeal phase: Secondary | ICD-10-CM | POA: Diagnosis not present

## 2021-04-12 DIAGNOSIS — R2689 Other abnormalities of gait and mobility: Secondary | ICD-10-CM | POA: Diagnosis not present

## 2021-04-12 DIAGNOSIS — R41841 Cognitive communication deficit: Secondary | ICD-10-CM | POA: Diagnosis not present

## 2021-04-12 DIAGNOSIS — R269 Unspecified abnormalities of gait and mobility: Secondary | ICD-10-CM | POA: Diagnosis not present

## 2021-04-12 DIAGNOSIS — R278 Other lack of coordination: Secondary | ICD-10-CM | POA: Diagnosis not present

## 2021-04-12 DIAGNOSIS — I63511 Cerebral infarction due to unspecified occlusion or stenosis of right middle cerebral artery: Secondary | ICD-10-CM | POA: Diagnosis not present

## 2021-04-12 DIAGNOSIS — R2681 Unsteadiness on feet: Secondary | ICD-10-CM

## 2021-04-12 DIAGNOSIS — R262 Difficulty in walking, not elsewhere classified: Secondary | ICD-10-CM | POA: Diagnosis not present
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
## 2021-04-12 NOTE — Addendum Note (Signed)
Addended by: Lyndon Code on: 04/12/2021 07:35 AM   Modules accepted: Level of Service
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
## 2021-04-12 NOTE — Patient Instructions (Signed)
Access Code: [email or web address] URL: [email or web address] Date: 04/12/2021 Prepared by: Marquita Palms  Exercises Side Pucker - 1 x daily - 7 x weekly - 3 sets - 10 reps Tongue Sweep Inside the Mouth - 1 x daily - 7 x weekly - 3 sets - 10 reps Lip Press - 1 x daily - 7 x weekly - 3 sets - 10 reps Cheek Puff - 1 x daily - 7 x weekly - 3 sets - 10 reps Alternating Pucker-Smile with Mouth Open - 1 x daily - 7 x weekly - 3 sets - 10 reps

## 2021-04-17 ENCOUNTER — Other Ambulatory Visit: Payer: Self-pay

## 2021-04-17 ENCOUNTER — Other Ambulatory Visit (HOSPITAL_COMMUNITY): Payer: Self-pay | Admitting: Interventional Radiology

## 2021-04-17 ENCOUNTER — Ambulatory Visit: Payer: Medicare HMO | Admitting: Occupational Therapy

## 2021-04-17 ENCOUNTER — Ambulatory Visit: Payer: Medicare HMO | Admitting: Physical Therapy

## 2021-04-17 ENCOUNTER — Ambulatory Visit: Payer: Medicare HMO | Admitting: Speech Pathology

## 2021-04-17 DIAGNOSIS — I639 Cerebral infarction, unspecified: Secondary | ICD-10-CM

## 2021-04-17 DIAGNOSIS — R262 Difficulty in walking, not elsewhere classified: Secondary | ICD-10-CM | POA: Diagnosis not present

## 2021-04-17 DIAGNOSIS — R41841 Cognitive communication deficit: Secondary | ICD-10-CM | POA: Diagnosis not present

## 2021-04-17 DIAGNOSIS — R1312 Dysphagia, oropharyngeal phase: Secondary | ICD-10-CM | POA: Diagnosis not present

## 2021-04-17 DIAGNOSIS — R2681 Unsteadiness on feet: Secondary | ICD-10-CM

## 2021-04-17 DIAGNOSIS — M6281 Muscle weakness (generalized): Secondary | ICD-10-CM

## 2021-04-17 DIAGNOSIS — I63511 Cerebral infarction due to unspecified occlusion or stenosis of right middle cerebral artery: Secondary | ICD-10-CM

## 2021-04-17 DIAGNOSIS — R2689 Other abnormalities of gait and mobility: Secondary | ICD-10-CM | POA: Diagnosis not present

## 2021-04-17 DIAGNOSIS — R269 Unspecified abnormalities of gait and mobility: Secondary | ICD-10-CM | POA: Diagnosis not present

## 2021-04-17 DIAGNOSIS — R278 Other lack of coordination: Secondary | ICD-10-CM
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
## 2021-04-18 NOTE — Telephone Encounter (Signed)
Bp readings documented

## 2021-04-19 ENCOUNTER — Encounter: Payer: Self-pay | Admitting: Occupational Therapy

## 2021-04-19 ENCOUNTER — Other Ambulatory Visit: Payer: Self-pay

## 2021-04-19 ENCOUNTER — Ambulatory Visit: Payer: Medicare HMO | Admitting: Speech Pathology

## 2021-04-19 ENCOUNTER — Ambulatory Visit: Payer: Medicare HMO | Admitting: Occupational Therapy

## 2021-04-19 ENCOUNTER — Ambulatory Visit: Payer: Medicare HMO | Admitting: Physical Therapy

## 2021-04-19 DIAGNOSIS — R262 Difficulty in walking, not elsewhere classified: Secondary | ICD-10-CM | POA: Diagnosis not present

## 2021-04-19 DIAGNOSIS — M6281 Muscle weakness (generalized): Secondary | ICD-10-CM

## 2021-04-19 DIAGNOSIS — R1312 Dysphagia, oropharyngeal phase: Secondary | ICD-10-CM

## 2021-04-19 DIAGNOSIS — I63511 Cerebral infarction due to unspecified occlusion or stenosis of right middle cerebral artery: Secondary | ICD-10-CM | POA: Diagnosis not present

## 2021-04-19 DIAGNOSIS — R278 Other lack of coordination: Secondary | ICD-10-CM

## 2021-04-19 DIAGNOSIS — R41841 Cognitive communication deficit: Secondary | ICD-10-CM | POA: Diagnosis not present

## 2021-04-19 DIAGNOSIS — I6601 Occlusion and stenosis of right middle cerebral artery: Secondary | ICD-10-CM

## 2021-04-19 DIAGNOSIS — R2681 Unsteadiness on feet: Secondary | ICD-10-CM

## 2021-04-19 DIAGNOSIS — R2689 Other abnormalities of gait and mobility: Secondary | ICD-10-CM | POA: Diagnosis not present

## 2021-04-19 DIAGNOSIS — R269 Unspecified abnormalities of gait and mobility: Secondary | ICD-10-CM | POA: Diagnosis not present
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
## 2021-04-24 ENCOUNTER — Other Ambulatory Visit: Payer: Self-pay

## 2021-04-24 ENCOUNTER — Ambulatory Visit: Payer: Medicare HMO | Admitting: Occupational Therapy

## 2021-04-24 ENCOUNTER — Ambulatory Visit: Payer: Medicare HMO

## 2021-04-24 DIAGNOSIS — R269 Unspecified abnormalities of gait and mobility: Secondary | ICD-10-CM | POA: Diagnosis not present

## 2021-04-24 DIAGNOSIS — R278 Other lack of coordination: Secondary | ICD-10-CM

## 2021-04-24 DIAGNOSIS — R262 Difficulty in walking, not elsewhere classified: Secondary | ICD-10-CM | POA: Diagnosis not present

## 2021-04-24 DIAGNOSIS — M6281 Muscle weakness (generalized): Secondary | ICD-10-CM

## 2021-04-24 DIAGNOSIS — R2689 Other abnormalities of gait and mobility: Secondary | ICD-10-CM | POA: Diagnosis not present

## 2021-04-24 DIAGNOSIS — R2681 Unsteadiness on feet: Secondary | ICD-10-CM

## 2021-04-24 DIAGNOSIS — I63511 Cerebral infarction due to unspecified occlusion or stenosis of right middle cerebral artery: Secondary | ICD-10-CM | POA: Diagnosis not present

## 2021-04-24 DIAGNOSIS — R41841 Cognitive communication deficit: Secondary | ICD-10-CM | POA: Diagnosis not present

## 2021-04-24 DIAGNOSIS — R1312 Dysphagia, oropharyngeal phase: Secondary | ICD-10-CM | POA: Diagnosis not present
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
## 2021-04-25 ENCOUNTER — Encounter: Payer: Self-pay | Admitting: Occupational Therapy
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
## 2021-04-26 ENCOUNTER — Other Ambulatory Visit: Payer: Self-pay

## 2021-04-26 ENCOUNTER — Encounter: Payer: Self-pay | Admitting: Occupational Therapy

## 2021-04-26 ENCOUNTER — Ambulatory Visit: Payer: Medicare HMO | Admitting: Speech Pathology

## 2021-04-26 ENCOUNTER — Ambulatory Visit: Payer: Medicare HMO

## 2021-04-26 ENCOUNTER — Ambulatory Visit: Payer: Medicare HMO | Admitting: Occupational Therapy

## 2021-04-26 DIAGNOSIS — R41841 Cognitive communication deficit: Secondary | ICD-10-CM | POA: Diagnosis not present

## 2021-04-26 DIAGNOSIS — R269 Unspecified abnormalities of gait and mobility: Secondary | ICD-10-CM

## 2021-04-26 DIAGNOSIS — R278 Other lack of coordination: Secondary | ICD-10-CM

## 2021-04-26 DIAGNOSIS — M6281 Muscle weakness (generalized): Secondary | ICD-10-CM

## 2021-04-26 DIAGNOSIS — R2681 Unsteadiness on feet: Secondary | ICD-10-CM | POA: Diagnosis not present

## 2021-04-26 DIAGNOSIS — R262 Difficulty in walking, not elsewhere classified: Secondary | ICD-10-CM | POA: Diagnosis not present

## 2021-04-26 DIAGNOSIS — R1312 Dysphagia, oropharyngeal phase: Secondary | ICD-10-CM | POA: Diagnosis not present

## 2021-04-26 DIAGNOSIS — I63511 Cerebral infarction due to unspecified occlusion or stenosis of right middle cerebral artery: Secondary | ICD-10-CM

## 2021-04-26 DIAGNOSIS — R2689 Other abnormalities of gait and mobility: Secondary | ICD-10-CM | POA: Diagnosis not present
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
## 2021-04-27 ENCOUNTER — Ambulatory Visit (HOSPITAL_COMMUNITY)
Admission: RE | Admit: 2021-04-27 | Discharge: 2021-04-27 | Disposition: A | Discharge status: Home / Self Care | Payer: Medicare HMO | Source: Ambulatory Visit | Attending: Interventional Radiology | Admitting: Interventional Radiology

## 2021-04-27 DIAGNOSIS — I63411 Cerebral infarction due to embolism of right middle cerebral artery: Secondary | ICD-10-CM | POA: Diagnosis not present

## 2021-04-27 DIAGNOSIS — I63033 Cerebral infarction due to thrombosis of bilateral carotid arteries: Secondary | ICD-10-CM | POA: Diagnosis not present

## 2021-04-27 DIAGNOSIS — M47812 Spondylosis without myelopathy or radiculopathy, cervical region: Secondary | ICD-10-CM | POA: Diagnosis not present

## 2021-04-27 DIAGNOSIS — I639 Cerebral infarction, unspecified: Secondary | ICD-10-CM | POA: Insufficient documentation

## 2021-04-27 MED ORDER — IOHEXOL 350 MG/ML SOLN
75.0000 mL | Freq: Once | INTRAVENOUS | Status: AC | PRN
  Administered 2021-04-27: 75 mL via INTRAVENOUS

## 2021-04-30 ENCOUNTER — Other Ambulatory Visit: Payer: Self-pay | Admitting: Physical Medicine & Rehabilitation

## 2021-05-01 ENCOUNTER — Ambulatory Visit: Payer: Medicare HMO

## 2021-05-01 ENCOUNTER — Telehealth: Payer: Self-pay | Admitting: Physical Medicine & Rehabilitation

## 2021-05-01 ENCOUNTER — Ambulatory Visit: Payer: Medicare HMO | Admitting: Occupational Therapy

## 2021-05-01 ENCOUNTER — Ambulatory Visit: Payer: Medicare HMO | Admitting: Speech Pathology

## 2021-05-01 ENCOUNTER — Telehealth: Payer: Self-pay

## 2021-05-01 ENCOUNTER — Other Ambulatory Visit: Payer: Self-pay | Admitting: Nurse Practitioner

## 2021-05-01 ENCOUNTER — Other Ambulatory Visit: Payer: Self-pay

## 2021-05-01 DIAGNOSIS — R41841 Cognitive communication deficit: Secondary | ICD-10-CM | POA: Diagnosis not present

## 2021-05-01 DIAGNOSIS — R2681 Unsteadiness on feet: Secondary | ICD-10-CM | POA: Diagnosis not present

## 2021-05-01 DIAGNOSIS — I63511 Cerebral infarction due to unspecified occlusion or stenosis of right middle cerebral artery: Secondary | ICD-10-CM | POA: Diagnosis not present

## 2021-05-01 DIAGNOSIS — R1312 Dysphagia, oropharyngeal phase: Secondary | ICD-10-CM | POA: Diagnosis not present

## 2021-05-01 DIAGNOSIS — R278 Other lack of coordination: Secondary | ICD-10-CM | POA: Diagnosis not present

## 2021-05-01 DIAGNOSIS — K219 Gastro-esophageal reflux disease without esophagitis: Secondary | ICD-10-CM

## 2021-05-01 DIAGNOSIS — R262 Difficulty in walking, not elsewhere classified: Secondary | ICD-10-CM

## 2021-05-01 DIAGNOSIS — M6281 Muscle weakness (generalized): Secondary | ICD-10-CM

## 2021-05-01 DIAGNOSIS — R2689 Other abnormalities of gait and mobility: Secondary | ICD-10-CM | POA: Diagnosis not present

## 2021-05-01 DIAGNOSIS — R269 Unspecified abnormalities of gait and mobility: Secondary | ICD-10-CM | POA: Diagnosis not present

## 2021-05-01 DIAGNOSIS — Z7689 Persons encountering health services in other specified circumstances: Secondary | ICD-10-CM

## 2021-05-01 DIAGNOSIS — I10 Essential (primary) hypertension: Secondary | ICD-10-CM

## 2021-05-01 NOTE — Telephone Encounter (Signed)
Notified to contact PCP.  They have an appt tomorrow.

## 2021-05-01 NOTE — Telephone Encounter (Signed)
Please let pt know PCP or Cardiology needs to rx

## 2021-05-01 NOTE — Telephone Encounter (Signed)
Patient returned call again and said she needs to know who can refill the patient brilenta -- he is completely out - left message earlier this AM  her number [phone] 2:19 pm [phone]
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
## 2021-05-01 NOTE — Telephone Encounter (Signed)
Patient wife Clydie Braun called he needs refill on [place]il[place] 90 m x2 x day  [phone] 9:09 am  [id number]
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
## 2021-05-01 NOTE — Telephone Encounter (Signed)
Left vm for 05/02/21 appointment screening-Toni 

## 2021-05-02 ENCOUNTER — Ambulatory Visit (INDEPENDENT_AMBULATORY_CARE_PROVIDER_SITE_OTHER): Payer: Medicare HMO | Admitting: Nurse Practitioner

## 2021-05-02 ENCOUNTER — Encounter: Payer: Self-pay | Admitting: Nurse Practitioner

## 2021-05-02 VITALS — BP 134/76 | HR 75 | Temp 98.3°F | Resp 16 | Ht 70.0 in | Wt 167.8 lb

## 2021-05-02 DIAGNOSIS — E782 Mixed hyperlipidemia: Secondary | ICD-10-CM | POA: Diagnosis not present

## 2021-05-02 DIAGNOSIS — Z1211 Encounter for screening for malignant neoplasm of colon: Secondary | ICD-10-CM | POA: Diagnosis not present

## 2021-05-02 DIAGNOSIS — K219 Gastro-esophageal reflux disease without esophagitis: Secondary | ICD-10-CM

## 2021-05-02 DIAGNOSIS — Z0001 Encounter for general adult medical examination with abnormal findings: Secondary | ICD-10-CM | POA: Diagnosis not present

## 2021-05-02 DIAGNOSIS — I63511 Cerebral infarction due to unspecified occlusion or stenosis of right middle cerebral artery: Secondary | ICD-10-CM

## 2021-05-02 DIAGNOSIS — R300 Dysuria: Secondary | ICD-10-CM

## 2021-05-02 DIAGNOSIS — I700 Atherosclerosis of aorta: Secondary | ICD-10-CM

## 2021-05-02 DIAGNOSIS — I10 Essential (primary) hypertension: Secondary | ICD-10-CM | POA: Diagnosis not present

## 2021-05-02 DIAGNOSIS — G8929 Other chronic pain: Secondary | ICD-10-CM

## 2021-05-02 DIAGNOSIS — M25511 Pain in right shoulder: Secondary | ICD-10-CM

## 2021-05-02 DIAGNOSIS — M25512 Pain in left shoulder: Secondary | ICD-10-CM

## 2021-05-02 DIAGNOSIS — Z1212 Encounter for screening for malignant neoplasm of rectum: Secondary | ICD-10-CM

## 2021-05-02 MED ORDER — LOVASTATIN 10 MG PO TABS: 10.0000 mg | ORAL_TABLET | Freq: Every day | ORAL | 1 refills | Status: DC

## 2021-05-02 MED ORDER — TICAGRELOR 90 MG PO TABS: 90.0000 mg | ORAL_TABLET | Freq: Two times a day (BID) | ORAL | 2 refills | Status: DC

## 2021-05-02 MED ORDER — TIZANIDINE HCL 4 MG PO TABS: 4.0000 mg | ORAL_TABLET | Freq: Every evening | ORAL | 0 refills | Status: DC | PRN

## 2021-05-02 NOTE — Telephone Encounter (Signed)
I spoke with his wife and let her know.  He is seeing the primary today for appt.
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
## 2021-05-03 ENCOUNTER — Other Ambulatory Visit: Payer: Self-pay

## 2021-05-03 ENCOUNTER — Ambulatory Visit: Payer: Medicare HMO | Admitting: Speech Pathology

## 2021-05-03 ENCOUNTER — Ambulatory Visit: Payer: Medicare HMO

## 2021-05-03 ENCOUNTER — Telehealth (HOSPITAL_COMMUNITY): Payer: Self-pay

## 2021-05-03 ENCOUNTER — Ambulatory Visit: Payer: Medicare HMO | Admitting: Occupational Therapy

## 2021-05-03 DIAGNOSIS — R278 Other lack of coordination: Secondary | ICD-10-CM

## 2021-05-03 DIAGNOSIS — R41841 Cognitive communication deficit: Secondary | ICD-10-CM

## 2021-05-03 DIAGNOSIS — M6281 Muscle weakness (generalized): Secondary | ICD-10-CM

## 2021-05-03 DIAGNOSIS — R269 Unspecified abnormalities of gait and mobility: Secondary | ICD-10-CM

## 2021-05-03 DIAGNOSIS — R262 Difficulty in walking, not elsewhere classified: Secondary | ICD-10-CM

## 2021-05-03 DIAGNOSIS — R1312 Dysphagia, oropharyngeal phase: Secondary | ICD-10-CM | POA: Diagnosis not present

## 2021-05-03 DIAGNOSIS — R2689 Other abnormalities of gait and mobility: Secondary | ICD-10-CM | POA: Diagnosis not present

## 2021-05-03 DIAGNOSIS — I63511 Cerebral infarction due to unspecified occlusion or stenosis of right middle cerebral artery: Secondary | ICD-10-CM | POA: Diagnosis not present

## 2021-05-03 DIAGNOSIS — R2681 Unsteadiness on feet: Secondary | ICD-10-CM | POA: Diagnosis not present

## 2021-05-03 LAB — UA/M W/RFLX CULTURE, ROUTINE
Bilirubin, UA: NEGATIVE
Glucose, UA: NEGATIVE
Leukocytes,UA: NEGATIVE
Nitrite, UA: NEGATIVE
Protein,UA: NEGATIVE
RBC, UA: NEGATIVE
Specific Gravity, UA: 1.029 (ref 1.005–1.030)
Urobilinogen, Ur: 0.2 mg/dL (ref 0.2–1.0)
pH, UA: 5.5 (ref 5.0–7.5)

## 2021-05-03 LAB — MICROSCOPIC EXAMINATION
Bacteria, UA: NONE SEEN
Casts: NONE SEEN /lpf
Epithelial Cells (non renal): NONE SEEN /hpf (ref 0–10)
RBC, Urine: NONE SEEN /hpf (ref 0–2)
WBC, UA: NONE SEEN /hpf (ref 0–5)

## 2021-05-03 NOTE — Telephone Encounter (Signed)
Pt agreed to f/u in 6 months with cta head/neck. AW 
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
## 2021-05-07 ENCOUNTER — Telehealth: Payer: Self-pay

## 2021-05-08 ENCOUNTER — Ambulatory Visit: Payer: Medicare HMO

## 2021-05-08 ENCOUNTER — Encounter: Payer: Self-pay | Admitting: Occupational Therapy

## 2021-05-08 ENCOUNTER — Other Ambulatory Visit: Payer: Self-pay

## 2021-05-08 ENCOUNTER — Ambulatory Visit: Payer: Medicare HMO | Attending: Physician Assistant | Admitting: Occupational Therapy

## 2021-05-08 ENCOUNTER — Ambulatory Visit: Payer: Medicare HMO | Admitting: Speech Pathology

## 2021-05-08 DIAGNOSIS — R41841 Cognitive communication deficit: Secondary | ICD-10-CM

## 2021-05-08 DIAGNOSIS — R262 Difficulty in walking, not elsewhere classified: Secondary | ICD-10-CM | POA: Diagnosis not present

## 2021-05-08 DIAGNOSIS — M6281 Muscle weakness (generalized): Secondary | ICD-10-CM | POA: Diagnosis not present

## 2021-05-08 DIAGNOSIS — R269 Unspecified abnormalities of gait and mobility: Secondary | ICD-10-CM

## 2021-05-08 DIAGNOSIS — R2681 Unsteadiness on feet: Secondary | ICD-10-CM | POA: Insufficient documentation

## 2021-05-08 DIAGNOSIS — R1312 Dysphagia, oropharyngeal phase: Secondary | ICD-10-CM | POA: Diagnosis present

## 2021-05-08 DIAGNOSIS — R2689 Other abnormalities of gait and mobility: Secondary | ICD-10-CM | POA: Insufficient documentation

## 2021-05-08 DIAGNOSIS — R278 Other lack of coordination: Secondary | ICD-10-CM | POA: Diagnosis not present

## 2021-05-08 DIAGNOSIS — I63511 Cerebral infarction due to unspecified occlusion or stenosis of right middle cerebral artery: Secondary | ICD-10-CM | POA: Diagnosis not present

## 2021-05-08 DIAGNOSIS — I6601 Occlusion and stenosis of right middle cerebral artery: Secondary | ICD-10-CM | POA: Insufficient documentation

## 2021-05-08 MED ORDER — TIZANIDINE HCL 4 MG PO TABS: 4.0000 mg | ORAL_TABLET | Freq: Two times a day (BID) | ORAL | 1 refills | Status: DC | PRN
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
## 2021-05-09 ENCOUNTER — Telehealth: Payer: Self-pay

## 2021-05-09 NOTE — Telephone Encounter (Signed)
Called pt and informed him that [name] advised he could take tizanidine every 12 hours as needed daily

## 2021-05-09 NOTE — Telephone Encounter (Signed)
Per Leonard [name] pt can take tizanidine 1-2 tablets every 12 hrs and needed.  New prescription was sent to pharmacy and pt notified.  Wife had called to ask if dose can be increased from 1 tablet to 2 and Leonard [name] approved

## 2021-05-09 NOTE — Telephone Encounter (Signed)
Note done

## 2021-05-10 ENCOUNTER — Other Ambulatory Visit: Payer: Self-pay

## 2021-05-10 ENCOUNTER — Ambulatory Visit: Payer: Medicare HMO | Admitting: Speech Pathology

## 2021-05-10 ENCOUNTER — Ambulatory Visit: Payer: Medicare HMO

## 2021-05-10 DIAGNOSIS — R269 Unspecified abnormalities of gait and mobility: Secondary | ICD-10-CM

## 2021-05-10 DIAGNOSIS — R278 Other lack of coordination: Secondary | ICD-10-CM

## 2021-05-10 DIAGNOSIS — R41841 Cognitive communication deficit: Secondary | ICD-10-CM

## 2021-05-10 DIAGNOSIS — M6281 Muscle weakness (generalized): Secondary | ICD-10-CM

## 2021-05-10 DIAGNOSIS — R262 Difficulty in walking, not elsewhere classified: Secondary | ICD-10-CM

## 2021-05-10 DIAGNOSIS — I6601 Occlusion and stenosis of right middle cerebral artery: Secondary | ICD-10-CM | POA: Diagnosis not present

## 2021-05-10 DIAGNOSIS — I63511 Cerebral infarction due to unspecified occlusion or stenosis of right middle cerebral artery: Secondary | ICD-10-CM | POA: Diagnosis not present

## 2021-05-10 DIAGNOSIS — R2681 Unsteadiness on feet: Secondary | ICD-10-CM | POA: Diagnosis not present

## 2021-05-10 DIAGNOSIS — R2689 Other abnormalities of gait and mobility: Secondary | ICD-10-CM | POA: Diagnosis not present
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
## 2021-05-15 ENCOUNTER — Other Ambulatory Visit: Payer: Self-pay

## 2021-05-15 ENCOUNTER — Ambulatory Visit: Payer: Medicare HMO

## 2021-05-15 ENCOUNTER — Ambulatory Visit: Payer: Medicare HMO | Admitting: Speech Pathology

## 2021-05-15 DIAGNOSIS — M6281 Muscle weakness (generalized): Secondary | ICD-10-CM

## 2021-05-15 DIAGNOSIS — R278 Other lack of coordination: Secondary | ICD-10-CM | POA: Diagnosis not present

## 2021-05-15 DIAGNOSIS — I63511 Cerebral infarction due to unspecified occlusion or stenosis of right middle cerebral artery: Secondary | ICD-10-CM | POA: Diagnosis not present

## 2021-05-15 DIAGNOSIS — I6601 Occlusion and stenosis of right middle cerebral artery: Secondary | ICD-10-CM | POA: Diagnosis not present

## 2021-05-15 DIAGNOSIS — R2681 Unsteadiness on feet: Secondary | ICD-10-CM | POA: Diagnosis not present

## 2021-05-15 DIAGNOSIS — R262 Difficulty in walking, not elsewhere classified: Secondary | ICD-10-CM

## 2021-05-15 DIAGNOSIS — R1312 Dysphagia, oropharyngeal phase: Secondary | ICD-10-CM

## 2021-05-15 DIAGNOSIS — R269 Unspecified abnormalities of gait and mobility: Secondary | ICD-10-CM | POA: Diagnosis not present

## 2021-05-15 DIAGNOSIS — R41841 Cognitive communication deficit: Secondary | ICD-10-CM | POA: Diagnosis not present

## 2021-05-15 DIAGNOSIS — R2689 Other abnormalities of gait and mobility: Secondary | ICD-10-CM | POA: Diagnosis not present
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
## 2021-05-17 ENCOUNTER — Encounter: Payer: Self-pay | Admitting: Occupational Therapy

## 2021-05-17 ENCOUNTER — Ambulatory Visit: Payer: Medicare HMO | Admitting: Speech Pathology

## 2021-05-17 ENCOUNTER — Ambulatory Visit: Payer: Medicare HMO | Admitting: Occupational Therapy

## 2021-05-17 ENCOUNTER — Other Ambulatory Visit: Payer: Self-pay

## 2021-05-17 ENCOUNTER — Ambulatory Visit: Payer: Medicare HMO

## 2021-05-17 ENCOUNTER — Telehealth: Payer: Self-pay

## 2021-05-17 DIAGNOSIS — R2689 Other abnormalities of gait and mobility: Secondary | ICD-10-CM | POA: Diagnosis not present

## 2021-05-17 DIAGNOSIS — R269 Unspecified abnormalities of gait and mobility: Secondary | ICD-10-CM

## 2021-05-17 DIAGNOSIS — M6281 Muscle weakness (generalized): Secondary | ICD-10-CM | POA: Diagnosis not present

## 2021-05-17 DIAGNOSIS — I6601 Occlusion and stenosis of right middle cerebral artery: Secondary | ICD-10-CM | POA: Diagnosis not present

## 2021-05-17 DIAGNOSIS — R41841 Cognitive communication deficit: Secondary | ICD-10-CM

## 2021-05-17 DIAGNOSIS — I63511 Cerebral infarction due to unspecified occlusion or stenosis of right middle cerebral artery: Secondary | ICD-10-CM

## 2021-05-17 DIAGNOSIS — R278 Other lack of coordination: Secondary | ICD-10-CM | POA: Diagnosis not present

## 2021-05-17 DIAGNOSIS — R2681 Unsteadiness on feet: Secondary | ICD-10-CM | POA: Diagnosis not present

## 2021-05-17 DIAGNOSIS — R262 Difficulty in walking, not elsewhere classified: Secondary | ICD-10-CM | POA: Diagnosis not present

## 2021-05-17 DIAGNOSIS — Z1211 Encounter for screening for malignant neoplasm of colon: Secondary | ICD-10-CM
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
## 2021-05-17 NOTE — Telephone Encounter (Signed)
Esend cologuard  to [employer]  
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
## 2021-05-18 ENCOUNTER — Other Ambulatory Visit: Payer: Self-pay

## 2021-05-18 NOTE — Patient Outreach (Signed)
[facility]) Care Management  05/18/2021  Leonard [name] 1956/01/23 [id number]   First telephone outreach attempt to obtain mRS. No answer. Left message for returned call.  Vanice Sarah [facility] Management Assistant [phone]

## 2021-05-21 ENCOUNTER — Other Ambulatory Visit: Payer: Self-pay

## 2021-05-21 NOTE — Patient Outreach (Signed)
[facility]) Care Management  05/21/2021  Leonard [name] [birth year] [id number]   Telephone outreach to patient to obtain mRS was successfully completed. MRS= 3   Vanice Sarah [facility] Care Management Assistant

## 2021-05-22 ENCOUNTER — Other Ambulatory Visit: Payer: Self-pay

## 2021-05-22 ENCOUNTER — Ambulatory Visit: Payer: Medicare HMO | Admitting: Speech Pathology

## 2021-05-22 ENCOUNTER — Ambulatory Visit: Payer: Medicare HMO | Admitting: Occupational Therapy

## 2021-05-22 ENCOUNTER — Ambulatory Visit: Payer: Medicare HMO

## 2021-05-22 DIAGNOSIS — M6281 Muscle weakness (generalized): Secondary | ICD-10-CM

## 2021-05-22 DIAGNOSIS — R278 Other lack of coordination: Secondary | ICD-10-CM | POA: Diagnosis not present

## 2021-05-22 DIAGNOSIS — R269 Unspecified abnormalities of gait and mobility: Secondary | ICD-10-CM

## 2021-05-22 DIAGNOSIS — R2689 Other abnormalities of gait and mobility: Secondary | ICD-10-CM | POA: Diagnosis not present

## 2021-05-22 DIAGNOSIS — I63511 Cerebral infarction due to unspecified occlusion or stenosis of right middle cerebral artery: Secondary | ICD-10-CM

## 2021-05-22 DIAGNOSIS — R262 Difficulty in walking, not elsewhere classified: Secondary | ICD-10-CM

## 2021-05-22 DIAGNOSIS — R2681 Unsteadiness on feet: Secondary | ICD-10-CM | POA: Diagnosis not present

## 2021-05-22 DIAGNOSIS — R41841 Cognitive communication deficit: Secondary | ICD-10-CM

## 2021-05-22 DIAGNOSIS — I6601 Occlusion and stenosis of right middle cerebral artery: Secondary | ICD-10-CM | POA: Diagnosis not present
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
## 2021-05-24 ENCOUNTER — Other Ambulatory Visit: Payer: Self-pay

## 2021-05-24 ENCOUNTER — Other Ambulatory Visit: Payer: Self-pay | Admitting: Nurse Practitioner

## 2021-05-24 ENCOUNTER — Ambulatory Visit: Payer: Medicare HMO | Admitting: Occupational Therapy

## 2021-05-24 ENCOUNTER — Ambulatory Visit: Payer: Medicare HMO

## 2021-05-24 ENCOUNTER — Ambulatory Visit: Payer: Medicare HMO | Admitting: Speech Pathology

## 2021-05-24 ENCOUNTER — Encounter: Payer: Self-pay | Admitting: Occupational Therapy

## 2021-05-24 DIAGNOSIS — M6281 Muscle weakness (generalized): Secondary | ICD-10-CM | POA: Diagnosis not present

## 2021-05-24 DIAGNOSIS — R41841 Cognitive communication deficit: Secondary | ICD-10-CM

## 2021-05-24 DIAGNOSIS — R278 Other lack of coordination: Secondary | ICD-10-CM

## 2021-05-24 DIAGNOSIS — I63511 Cerebral infarction due to unspecified occlusion or stenosis of right middle cerebral artery: Secondary | ICD-10-CM | POA: Diagnosis not present

## 2021-05-24 DIAGNOSIS — R269 Unspecified abnormalities of gait and mobility: Secondary | ICD-10-CM

## 2021-05-24 DIAGNOSIS — I6601 Occlusion and stenosis of right middle cerebral artery: Secondary | ICD-10-CM | POA: Diagnosis not present

## 2021-05-24 DIAGNOSIS — R262 Difficulty in walking, not elsewhere classified: Secondary | ICD-10-CM | POA: Diagnosis not present

## 2021-05-24 DIAGNOSIS — R2681 Unsteadiness on feet: Secondary | ICD-10-CM | POA: Diagnosis not present

## 2021-05-24 DIAGNOSIS — R2689 Other abnormalities of gait and mobility: Secondary | ICD-10-CM | POA: Diagnosis not present
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
## 2021-05-24 NOTE — Patient Instructions (Signed)
Local Driver Evaluation Programs: ° °Comprehensive Evaluation: includes clinical and in vehicle behind the wheel testing by OCCUPATIONAL THERAPIST. Programs have varying levels of adaptive controls available for trial.  ° °[facility] °[address] °[place], [place]  [address] °[phone] or [phone] °http://www.driver-rehab.com °Evaluator:  Cyndee Crompton, OT/CDRS/CDI/SCDCM/Low Vision Certification ° °[facility] °[address] °[place], [place] [address] °[phone] °https://www.[facility].org/home/services/rehabilitation.aspx °Evaluators:  Shannon Sheek, OT and Jill Tucker, OT ° °[facility] [place] [facility] (ONLY SERVES VETERANS!!) °[facility] Medicine [facility] Rehabilitation Services °[address] °[place], [place]  [address] °[phone] °http://www.[place].va.gov/services/Physical_Medicine_Rehabilitation_Services.asp °Evaluators:  Eric Andrews, KT; Heidi Harris, KT;  Gary Whitaker, KT (KT=kiniesotherapist) ° ° °Clinical evaluations only:  Includes clinical testing, refers to other programs or local certified driving instructor for behind the wheel testing. ° °[facility] (outpatient Rehab) °[facility] °[address] °[place], [place] [address] °[phone] for scheduling °http://www.[facility].edu/Outpatient-Rehabilitation/Neurorehabilitation-Therapy.htm °Evaluators:  Kelly Lambeth, OT; Kate Phillips, OT ° °Other area clinical evaluators available upon request including [facility], [facility] and [facility]. ° ° °    Resource List °What is a Driver Evaluation: °Your Road Ahead - A Guide to Comprehensive Driving Evaluations °http://www.thehartford.com/resources/mature-market-excellence/publications-on-aging ° °Association for [employer] Services - Disability and Driving Fact Sheets °http://www.aded.net/?page=510 ° °Driving after a Brain  Injury: °[employer] °http://www.biausa.org/tbims-abstracts/if-there-is-an-effective-way-to-determine-if-someone-is-ready-to-drive-after-tbi?A=SearchResult&SearchID=9495675&ObjectID=2758842&ObjectType=35 ° °Driving with Adaptive Equipment: °[facility] Process °http://www.driver-rehab.com/adaptive-equipment ° °[employer] Mob[employer] °http://www.nmeda.com/ ° ° ° ° ° ° °  °
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
## 2021-05-29 ENCOUNTER — Ambulatory Visit: Payer: Medicare HMO | Admitting: Speech Pathology

## 2021-05-29 ENCOUNTER — Other Ambulatory Visit: Payer: Self-pay

## 2021-05-29 ENCOUNTER — Ambulatory Visit: Payer: Medicare HMO | Admitting: Occupational Therapy

## 2021-05-29 ENCOUNTER — Ambulatory Visit: Payer: Medicare HMO

## 2021-05-29 DIAGNOSIS — M6281 Muscle weakness (generalized): Secondary | ICD-10-CM | POA: Diagnosis not present

## 2021-05-29 DIAGNOSIS — R278 Other lack of coordination: Secondary | ICD-10-CM

## 2021-05-29 DIAGNOSIS — R262 Difficulty in walking, not elsewhere classified: Secondary | ICD-10-CM | POA: Diagnosis not present

## 2021-05-29 DIAGNOSIS — R2689 Other abnormalities of gait and mobility: Secondary | ICD-10-CM

## 2021-05-29 DIAGNOSIS — I63511 Cerebral infarction due to unspecified occlusion or stenosis of right middle cerebral artery: Secondary | ICD-10-CM

## 2021-05-29 DIAGNOSIS — R2681 Unsteadiness on feet: Secondary | ICD-10-CM | POA: Diagnosis not present

## 2021-05-29 DIAGNOSIS — R269 Unspecified abnormalities of gait and mobility: Secondary | ICD-10-CM

## 2021-05-29 DIAGNOSIS — R41841 Cognitive communication deficit: Secondary | ICD-10-CM

## 2021-05-29 DIAGNOSIS — I6601 Occlusion and stenosis of right middle cerebral artery: Secondary | ICD-10-CM | POA: Diagnosis not present
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
## 2021-05-30 ENCOUNTER — Encounter: Payer: Self-pay | Admitting: Neurology

## 2021-05-30 ENCOUNTER — Ambulatory Visit: Payer: Medicare HMO | Admitting: Neurology

## 2021-05-30 VITALS — BP 138/74 | HR 54 | Wt 169.0 lb

## 2021-05-30 DIAGNOSIS — I6521 Occlusion and stenosis of right carotid artery: Secondary | ICD-10-CM

## 2021-05-30 DIAGNOSIS — R29898 Other symptoms and signs involving the musculoskeletal system: Secondary | ICD-10-CM

## 2021-05-30 DIAGNOSIS — U071 COVID-19: Secondary | ICD-10-CM | POA: Diagnosis not present

## 2021-05-30 DIAGNOSIS — M25512 Pain in left shoulder: Secondary | ICD-10-CM

## 2021-05-30 DIAGNOSIS — I63411 Cerebral infarction due to embolism of right middle cerebral artery: Secondary | ICD-10-CM | POA: Diagnosis not present

## 2021-05-30 DIAGNOSIS — G8929 Other chronic pain: Secondary | ICD-10-CM | POA: Diagnosis not present

## 2021-05-30 NOTE — Progress Notes (Signed)
[facility] [address] [place]. [place] [address] [phone]       OFFICE FOLLOW-UP NOTE  [name] Date of Birth:  Dec 19, 1955 Medical Record Number:  [id number]   HPI: Leonard [name] is a 65 year old pleasant Caucasian male seen today for initial office follow-up visit following hospital admission for stroke in April 2022.  He is accompanied by his wife.  History is obtained from them and review of electronic medical records and personally reviewed pertinent imaging films in PACS.  Patient has no significant past medical history and presented on 01/24/2021 with sudden onset of left hemiplegia and left facial droop, left hemineglect and dysarthria.  He was last seen to be normal by family at 6:30 PM when he went to go to sit on a bench at a pond on his property.  When family returned to check on him at 9:30 PM he was slumped over to his left side and hemiplegic with dysarthria.  EMS was called and code stroke was called in route.  His blood pressure on arrival was elevated at 232/124.  CT scan of the head showed no hemorrhage but subtle hypodensity in portions of the right MCA territory as well as dense right MCA sign.  Aspect score was 7.  CT angiogram showed complete occlusion of the right internal carotid artery at its origin with complete occlusion of the right middle cerebral artery at its origin with no collateral flow.  There was 68 mL of right middle cerebral artery core infarct with 133 mils of ischemic penumbra on the perfusion scan.  Patient was given IV tPA and went for emergent mechanical thrombectomy which was successfully performed by Dr. Corliss Skains with TICI 3 revascularization but requiring rescue right carotid stenting.  Patient was kept in the ICU and subsequently extubated and started on aspirin and Brilinta.  Echocardiogram showed normal ejection fraction of 60 to 65% with mild left ventricular hypertrophy.  LDL cholesterol is 126 mg percent and hemoglobin A1c  was 5.8.  Urine drug screen was negative.  Patient tested positive for COVID during this hospitalization and he had previously refused COVID vaccination.  Chest x-ray showed multifocal pneumonia and he was treated with remdesivir, Actemra and steroids as per critical care team and recovered gradually.  He went to inpatient rehab and was subsequently discharged home.  He is currently living at home and doing well.  He still has significant weakness in his left grip and hand muscles as well as significant shoulder pain which limits his ability to work but still he is pretty independent at home and [place] and climb steps without assistance.  He is still on aspirin and Brilinta and tolerating it well without bruising or bleeding.  He did have a follow-up CT angiogram done last month which shows patent carotid stent without significant restenosis.  He states his blood pressure is well controlled and today it is 138/74.  He is tolerating lovastatin well without muscle aches and pains.  He has been scheduled for follow-up carotid ultrasound next week by his primary care physician.  ROS:   14 system review of systems is positive for hand weakness, shoulder pain, difficulty walking, neck pain, stiffness all other  systems negative  PMH:  Past Medical History:  Diagnosis Date   Stroke [facility])    april 2022, left hand weak, left foot    Social History:  Social History   Socioeconomic History   Marital status: Married    Spouse  name: Kenna Kirn   Number of children: Not on file   Years of education: Not on file   Highest education level: Not on file  Occupational History   Not on file  Tobacco Use   Smoking status: Never    Passive exposure: Never   Smokeless tobacco: Never  Vaping Use   Vaping Use: Never used  Substance and Sexual Activity   Alcohol use: Yes    Alcohol/week: 10.0 standard drinks    Types: 10 Cans of beer per week    Comment: weekly   Drug use: Never   Sexual  activity: Never  Other Topics Concern   Not on file  Social History Narrative   Not on file   Social Determinants of Health   Financial Resource Strain: Not on file  Food Insecurity: Not on file  Transportation Needs: Not on file  Physical Activity: Not on file  Stress: Not on file  Social Connections: Not on file  Intimate Partner Violence: Not on file    Medications:   Current Outpatient Medications on File Prior to Visit  Medication Sig Dispense Refill   acetaminophen (TYLENOL) 325 MG tablet Take 2 tablets (650 mg total) by mouth every 4 (four) hours as needed for mild pain (or temp > 37.5 C (99.5 F)).     amLODipine (NORVASC) 5 MG tablet TAKE 1 TABLET (5 MG TOTAL) BY MOUTH DAILY. 30 tablet 3   aspirin EC 81 MG tablet Take 81 mg by mouth daily. Swallow whole.     [employer] VITAMIN C 500 MG tablet TAKE 1 TABLET (500 MG TOTAL) BY MOUTH DAILY. 30 tablet 3   lovastatin (MEVACOR) 10 MG tablet Take 1 tablet (10 mg total) by mouth at bedtime. 90 tablet 1   pantoprazole (PROTONIX) 40 MG tablet TAKE 1 TABLET BY MOUTH EVERY DAY 30 tablet 3   ticagrelor (BRILINTA) 90 MG TABS tablet Take 1 tablet (90 mg total) by mouth 2 (two) times daily. 60 tablet 2   tiZANidine (ZANAFLEX) 4 MG tablet Take 1-2 tablets (4-8 mg total) by mouth every 12 (twelve) hours as needed for muscle spasms. 120 tablet 1   No current facility-administered medications on file prior to visit.    Allergies:  No Known Allergies  Physical Exam General: well developed, well nourished middle-aged Caucasian male, seated, in no evident distress Head: head normocephalic and atraumatic.  Neck: supple with no carotid or supraclavicular bruits Cardiovascular: regular rate and rhythm, no murmurs Musculoskeletal: no deformity Skin:  no rash/petichiae Vascular:  Normal pulses all extremities Vitals:   05/30/21 1022  BP: 138/74  Pulse: (!) 54   Neurologic Exam Mental Status: Awake and fully alert. Oriented to place and time.  Recent and remote memory intact. Attention span, concentration and fund of knowledge appropriate. Mood and affect appropriate.  Cranial Nerves: Fundoscopic exam reveals sharp disc margins. Pupils equal, briskly reactive to light. Extraocular movements full without nystagmus. Visual fields full to confrontation. Hearing intact. Facial sensation intact.  Moderate left lower facial weakness., tongue, palate moves normally and symmetrically.  Motor: Normal strength on the right side.  Significant left hand and grip weakness and unable to grip can barely bend fingers.  3/5 strength at the left shoulder and elbows.  Mild left ankle dorsiflexor weakness.  Tone is increased on the left compared to the right. Sensory.: intact to touch ,pinprick .position and vibratory sensation except mild subjective diminished touch pinprick sensation in the lower half of left face and left upper  extremity only..  Coordination: Rapid alternating movements normal in all extremities. Finger-to-nose and heel-to-shin performed accurately bilaterally. Gait and Station: Arises from chair without difficulty. Stance is normal. Gait demonstrates normal stride length and mild stiffness and dragging of the left leg with mild foot drop..  Tandem walking not tested reflexes: 2+ and asymmetric and brisker on the left. Toes downgoing.   NIHSS  4 Modified Rankin  2   ASSESSMENT: 65 year old Caucasian male with right MCA infarct in April 2022 due to right carotid occlusion s/p mechanical thrombectomy with revascularization requiring rescue right carotid stenting.  Vascular risk factors of hypertension, hyperlipidemia , acute COVID illness and carotid atherosclerotic disease.  Patient is doing well but still has significant left hand weakness.     PLAN: I had a long d/w patient and his wife about his recent stroke, right carotid occlusion and carotid stenting, risk for recurrent stroke/TIAs, personally independently reviewed imaging studies  and stroke evaluation results and answered questions.Continue aspirin 81 mg daily and Brilinta (ticagrelor) 90 mg bid  for secondary stroke prevention and maintain strict control of hypertension with blood pressure goal below 130/90, diabetes with hemoglobin A1c goal below 6.5% and lipids with LDL cholesterol goal below 70 mg/dL. I also advised the patient to eat a healthy diet with plenty of whole grains, cereals, fruits and vegetables, exercise regularly and maintain ideal body weight .refer to Dr. Wynn Banker for evaluation treatment for his post stroke chronic left shoulder pain.  Continue ongoing physical and occupational therapy.  Followup in the future with my nurse practitioner Shanda Bumps in 6 months or call earlier if necessary. Greater than 50% of time during this 25 minute visit was spent on counseling,explanation of diagnosis, planning of further management, discussion with patient and family and coordination of care Delia Heady, MD Note: This document was prepared with digital dictation and possible smart phrase technology. Any transcriptional errors that result from this process are unintentional

## 2021-05-30 NOTE — Patient Instructions (Addendum)
I had a long d/w patient and his wife about his recent stroke, right carotid occlusion and carotid stenting, risk for recurrent stroke/TIAs, personally independently reviewed imaging studies and stroke evaluation results and answered questions.Continue aspirin 81 mg daily and Brilinta (ticagrelor) 90 mg bid  for secondary stroke prevention and maintain strict control of hypertension with blood pressure goal below 130/90, diabetes with hemoglobin A1c goal below 6.5% and lipids with LDL cholesterol goal below 70 mg/dL. I also advised the patient to eat a healthy diet with plenty of whole grains, cereals, fruits and vegetables, exercise regularly and maintain ideal body weight .refer to Dr. Wynn Banker for evaluation treatment for his post stroke chronic left shoulder pain.  Continue ongoing physical and occupational therapy.  Followup in the future with my nurse practitioner Shanda Bumps in 6 months or call earlier if necessary. Stroke Prevention Some medical conditions and behaviors are associated with a higher chance of having a stroke. You can help prevent a stroke by making nutrition, lifestyle,and other changes, including managing any medical conditions you Amor have. What nutrition changes can be made?  Eat healthy foods. You can do this by: Choosing foods high in fiber, such as fresh fruits and vegetables and whole grains. Eating at least 5 or more servings of fruits and vegetables a day. Try to fill half of your plate at each meal with fruits and vegetables. Choosing lean protein foods, such as lean cuts of meat, poultry without skin, fish, tofu, beans, and nuts. Eating low-fat dairy products. Avoiding foods that are high in salt (sodium). This can help lower blood pressure. Avoiding foods that have saturated fat, trans fat, and cholesterol. This can help prevent high cholesterol. Avoiding processed and premade foods. Follow your health care provider's specific guidelines for losing weight, controlling  high blood pressure (hypertension), lowering high cholesterol, and managing diabetes. These Chianese include: Reducing your daily calorie intake. Limiting your daily sodium intake to 1,500 milligrams (mg). Using only healthy fats for cooking, such as olive oil, canola oil, or sunflower oil. Counting your daily carbohydrate intake. What lifestyle changes can be made? Maintain a healthy weight. Talk to your health care provider about your ideal weight. Get at least 30 minutes of moderate physical activity at least 5 days a week. Moderate activity includes brisk walking, biking, and swimming. Do not use any products that contain nicotine or tobacco, such as cigarettes and e-cigarettes. If you need help quitting, ask your health care provider. It Wedekind also be helpful to avoid exposure to secondhand smoke. Limit alcohol intake to no more than 1 drink a day for nonpregnant women and 2 drinks a day for men. One drink equals 12 oz of beer, 5 oz of wine, or 1 oz of hard liquor. Stop any illegal drug use. Avoid taking birth control pills. Talk to your health care provider about the risks of taking birth control pills if: You are over 41 years old. You smoke. You get migraines. You have ever had a blood clot. What other changes can be made? Manage your cholesterol levels. Eating a healthy diet is important for preventing high cholesterol. If cholesterol cannot be managed through diet alone, you Gosa also need to take medicines. Take any prescribed medicines to control your cholesterol as told by your health care provider. Manage your diabetes. Eating a healthy diet and exercising regularly are important parts of managing your blood sugar. If your blood sugar cannot be managed through diet and exercise, you Eckart need to take medicines. Take any  prescribed medicines to control your diabetes as told by your health care provider. Control your hypertension. To reduce your risk of stroke, try to keep your blood  pressure below 130/80. Eating a healthy diet and exercising regularly are an important part of controlling your blood pressure. If your blood pressure cannot be managed through diet and exercise, you Tourville need to take medicines. Take any prescribed medicines to control hypertension as told by your health care provider. Ask your health care provider if you should monitor your blood pressure at home. Have your blood pressure checked every year, even if your blood pressure is normal. Blood pressure increases with age and some medical conditions. Get evaluated for sleep disorders (sleep apnea). Talk to your health care provider about getting a sleep evaluation if you snore a lot or have excessive sleepiness. Take over-the-counter and prescription medicines only as told by your health care provider. Aspirin or blood thinners (antiplatelets or anticoagulants) Evon be recommended to reduce your risk of forming blood clots that can lead to stroke. Make sure that any other medical conditions you have, such as atrial fibrillation or atherosclerosis, are managed. What are the warning signs of a stroke? The warning signs of a stroke can be easily remembered as BEFAST. B is for balance. Signs include: Dizziness. Loss of balance or coordination. Sudden trouble walking. E is for eyes. Signs include: A sudden change in vision. Trouble seeing. F is for face. Signs include: Sudden weakness or numbness of the face. The face or eyelid drooping to one side. A is for arms. Signs include: Sudden weakness or numbness of the arm, usually on one side of the body. S is for speech. Signs include: Trouble speaking (aphasia). Trouble understanding. T is for time. These symptoms Zellner represent a serious problem that is an emergency. Do not wait to see if the symptoms will go away. Get medical help right away. Call your local emergency services (911 in the [place] Do not drive yourself to the hospital. Other signs of stroke  Anstey include: A sudden, severe headache with no known cause. Nausea or vomiting. Seizure. Where to find more information For more information, visit: American Stroke Association: www.strokeassociation.org National Stroke Association: www.stroke.org Summary You can prevent a stroke by eating healthy, exercising, not smoking, limiting alcohol intake, and managing any medical conditions you Lantier have. Do not use any products that contain nicotine or tobacco, such as cigarettes and e-cigarettes. If you need help quitting, ask your health care provider. It Helin also be helpful to avoid exposure to secondhand smoke. Remember BEFAST for warning signs of stroke. Get help right away if you or a loved one has any of these signs. This information is not intended to replace advice given to you by your health care provider. Make sure you discuss any questions you have with your healthcare provider. Document Revised: 09/05/2017 Document Reviewed: 10/29/2016 Elsevier Patient Education  2021 [employer].

## 2021-05-31 ENCOUNTER — Other Ambulatory Visit: Payer: Self-pay

## 2021-05-31 ENCOUNTER — Ambulatory Visit: Payer: Medicare HMO

## 2021-05-31 ENCOUNTER — Ambulatory Visit: Payer: Medicare HMO | Admitting: Occupational Therapy

## 2021-05-31 ENCOUNTER — Ambulatory Visit: Payer: Medicare HMO | Admitting: Speech Pathology

## 2021-05-31 ENCOUNTER — Encounter: Payer: Self-pay | Admitting: Occupational Therapy

## 2021-05-31 DIAGNOSIS — R2689 Other abnormalities of gait and mobility: Secondary | ICD-10-CM | POA: Diagnosis not present

## 2021-05-31 DIAGNOSIS — I63511 Cerebral infarction due to unspecified occlusion or stenosis of right middle cerebral artery: Secondary | ICD-10-CM

## 2021-05-31 DIAGNOSIS — M6281 Muscle weakness (generalized): Secondary | ICD-10-CM

## 2021-05-31 DIAGNOSIS — R41841 Cognitive communication deficit: Secondary | ICD-10-CM | POA: Diagnosis not present

## 2021-05-31 DIAGNOSIS — R262 Difficulty in walking, not elsewhere classified: Secondary | ICD-10-CM

## 2021-05-31 DIAGNOSIS — R278 Other lack of coordination: Secondary | ICD-10-CM

## 2021-05-31 DIAGNOSIS — R269 Unspecified abnormalities of gait and mobility: Secondary | ICD-10-CM | POA: Diagnosis not present

## 2021-05-31 DIAGNOSIS — R2681 Unsteadiness on feet: Secondary | ICD-10-CM | POA: Diagnosis not present

## 2021-05-31 DIAGNOSIS — I6601 Occlusion and stenosis of right middle cerebral artery: Secondary | ICD-10-CM | POA: Diagnosis not present
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
## 2021-06-01 ENCOUNTER — Telehealth: Payer: Self-pay | Admitting: Student

## 2021-06-01 NOTE — Telephone Encounter (Signed)
Wife called in follow-up to patient visit with Dr. Pearlean Brownie yesterday.  Dr. Pearlean Brownie suggested she call to inquire when the patient could stop his Brilinta.  Patient s/p recent stent placement in April 2022.  Recommended ongoing Brilinta use for at least 6 months.  Wife verbalizes understanding. Reports patient is tolerating medication well, no bruising or bleeding.  Says it sometimes makes him "cold natured" but otherwise no issues or concerns with medication use.   Loyce Dys, MS RD PA-C

## 2021-06-05 ENCOUNTER — Ambulatory Visit: Payer: Medicare HMO | Admitting: Occupational Therapy

## 2021-06-05 ENCOUNTER — Ambulatory Visit: Payer: Medicare HMO | Admitting: Physical Therapy

## 2021-06-05 ENCOUNTER — Ambulatory Visit: Payer: Medicare HMO | Admitting: Speech Pathology

## 2021-06-05 ENCOUNTER — Other Ambulatory Visit: Payer: Self-pay

## 2021-06-05 ENCOUNTER — Encounter: Payer: Self-pay | Admitting: Occupational Therapy

## 2021-06-05 DIAGNOSIS — R269 Unspecified abnormalities of gait and mobility: Secondary | ICD-10-CM | POA: Diagnosis not present

## 2021-06-05 DIAGNOSIS — I6601 Occlusion and stenosis of right middle cerebral artery: Secondary | ICD-10-CM | POA: Diagnosis not present

## 2021-06-05 DIAGNOSIS — I63511 Cerebral infarction due to unspecified occlusion or stenosis of right middle cerebral artery: Secondary | ICD-10-CM | POA: Diagnosis not present

## 2021-06-05 DIAGNOSIS — R278 Other lack of coordination: Secondary | ICD-10-CM

## 2021-06-05 DIAGNOSIS — R2681 Unsteadiness on feet: Secondary | ICD-10-CM | POA: Diagnosis not present

## 2021-06-05 DIAGNOSIS — R41841 Cognitive communication deficit: Secondary | ICD-10-CM

## 2021-06-05 DIAGNOSIS — M6281 Muscle weakness (generalized): Secondary | ICD-10-CM | POA: Diagnosis not present

## 2021-06-05 DIAGNOSIS — R2689 Other abnormalities of gait and mobility: Secondary | ICD-10-CM | POA: Diagnosis not present

## 2021-06-05 DIAGNOSIS — R262 Difficulty in walking, not elsewhere classified: Secondary | ICD-10-CM | POA: Diagnosis not present
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
## 2021-06-06 ENCOUNTER — Other Ambulatory Visit: Payer: Medicare HMO

## 2021-06-07 ENCOUNTER — Ambulatory Visit: Payer: Medicare HMO | Admitting: Speech Pathology

## 2021-06-07 ENCOUNTER — Other Ambulatory Visit: Payer: Self-pay

## 2021-06-07 ENCOUNTER — Ambulatory Visit: Payer: Medicare HMO

## 2021-06-07 ENCOUNTER — Ambulatory Visit: Payer: Medicare HMO | Attending: Physician Assistant | Admitting: Occupational Therapy

## 2021-06-07 ENCOUNTER — Encounter: Payer: Self-pay | Admitting: Occupational Therapy

## 2021-06-07 DIAGNOSIS — R41841 Cognitive communication deficit: Secondary | ICD-10-CM | POA: Insufficient documentation

## 2021-06-07 DIAGNOSIS — Z1211 Encounter for screening for malignant neoplasm of colon: Secondary | ICD-10-CM | POA: Diagnosis not present

## 2021-06-07 DIAGNOSIS — R278 Other lack of coordination: Secondary | ICD-10-CM | POA: Diagnosis not present

## 2021-06-07 DIAGNOSIS — M6281 Muscle weakness (generalized): Secondary | ICD-10-CM

## 2021-06-07 DIAGNOSIS — I63511 Cerebral infarction due to unspecified occlusion or stenosis of right middle cerebral artery: Secondary | ICD-10-CM | POA: Diagnosis not present

## 2021-06-07 DIAGNOSIS — R262 Difficulty in walking, not elsewhere classified: Secondary | ICD-10-CM

## 2021-06-07 DIAGNOSIS — R269 Unspecified abnormalities of gait and mobility: Secondary | ICD-10-CM | POA: Insufficient documentation

## 2021-06-07 DIAGNOSIS — R2681 Unsteadiness on feet: Secondary | ICD-10-CM | POA: Diagnosis not present

## 2021-06-07 DIAGNOSIS — R1312 Dysphagia, oropharyngeal phase: Secondary | ICD-10-CM | POA: Diagnosis not present
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
## 2021-06-12 ENCOUNTER — Ambulatory Visit: Payer: Medicare HMO | Admitting: Occupational Therapy

## 2021-06-12 ENCOUNTER — Encounter: Payer: Self-pay | Admitting: Occupational Therapy

## 2021-06-12 ENCOUNTER — Other Ambulatory Visit: Payer: Self-pay

## 2021-06-12 ENCOUNTER — Ambulatory Visit: Payer: Medicare HMO

## 2021-06-12 ENCOUNTER — Ambulatory Visit: Payer: Medicare HMO | Admitting: Speech Pathology

## 2021-06-12 DIAGNOSIS — R269 Unspecified abnormalities of gait and mobility: Secondary | ICD-10-CM | POA: Diagnosis not present

## 2021-06-12 DIAGNOSIS — R278 Other lack of coordination: Secondary | ICD-10-CM

## 2021-06-12 DIAGNOSIS — M6281 Muscle weakness (generalized): Secondary | ICD-10-CM | POA: Diagnosis not present

## 2021-06-12 DIAGNOSIS — R262 Difficulty in walking, not elsewhere classified: Secondary | ICD-10-CM

## 2021-06-12 DIAGNOSIS — I63511 Cerebral infarction due to unspecified occlusion or stenosis of right middle cerebral artery: Secondary | ICD-10-CM | POA: Diagnosis not present

## 2021-06-12 DIAGNOSIS — R41841 Cognitive communication deficit: Secondary | ICD-10-CM

## 2021-06-12 DIAGNOSIS — R1312 Dysphagia, oropharyngeal phase: Secondary | ICD-10-CM

## 2021-06-12 DIAGNOSIS — R2681 Unsteadiness on feet: Secondary | ICD-10-CM | POA: Diagnosis not present
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
## 2021-06-13 ENCOUNTER — Encounter: Payer: Self-pay | Admitting: Nurse Practitioner

## 2021-06-13 ENCOUNTER — Encounter: Payer: Self-pay | Admitting: Occupational Therapy

## 2021-06-13 ENCOUNTER — Ambulatory Visit (INDEPENDENT_AMBULATORY_CARE_PROVIDER_SITE_OTHER): Payer: Medicare HMO | Admitting: Nurse Practitioner

## 2021-06-13 ENCOUNTER — Ambulatory Visit: Payer: Medicare HMO | Admitting: Occupational Therapy

## 2021-06-13 VITALS — BP 132/80 | HR 70 | Temp 98.4°F | Resp 16 | Ht 70.0 in | Wt 170.0 lb

## 2021-06-13 DIAGNOSIS — M6281 Muscle weakness (generalized): Secondary | ICD-10-CM

## 2021-06-13 DIAGNOSIS — R2681 Unsteadiness on feet: Secondary | ICD-10-CM | POA: Diagnosis not present

## 2021-06-13 DIAGNOSIS — R278 Other lack of coordination: Secondary | ICD-10-CM | POA: Diagnosis not present

## 2021-06-13 DIAGNOSIS — M25512 Pain in left shoulder: Secondary | ICD-10-CM | POA: Diagnosis not present

## 2021-06-13 DIAGNOSIS — I63511 Cerebral infarction due to unspecified occlusion or stenosis of right middle cerebral artery: Secondary | ICD-10-CM

## 2021-06-13 DIAGNOSIS — R41841 Cognitive communication deficit: Secondary | ICD-10-CM | POA: Diagnosis not present

## 2021-06-13 DIAGNOSIS — R262 Difficulty in walking, not elsewhere classified: Secondary | ICD-10-CM | POA: Diagnosis not present

## 2021-06-13 DIAGNOSIS — E782 Mixed hyperlipidemia: Secondary | ICD-10-CM

## 2021-06-13 DIAGNOSIS — R1312 Dysphagia, oropharyngeal phase: Secondary | ICD-10-CM | POA: Diagnosis not present

## 2021-06-13 DIAGNOSIS — R269 Unspecified abnormalities of gait and mobility: Secondary | ICD-10-CM | POA: Diagnosis not present

## 2021-06-13 MED ORDER — TRAMADOL HCL 50 MG PO TABS
25.0000 mg | ORAL_TABLET | Freq: Four times a day (QID) | ORAL | 0 refills | Status: AC | PRN
Start: 2021-06-13 — End: 2021-06-18

## 2021-06-13 MED ORDER — LOVASTATIN 10 MG PO TABS: 10.0000 mg | ORAL_TABLET | Freq: Every day | ORAL | 1 refills | Status: DC
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
## 2021-06-14 ENCOUNTER — Ambulatory Visit: Payer: Medicare HMO | Admitting: Occupational Therapy

## 2021-06-14 ENCOUNTER — Ambulatory Visit: Payer: Medicare HMO

## 2021-06-14 ENCOUNTER — Encounter: Payer: Self-pay | Admitting: Occupational Therapy

## 2021-06-14 ENCOUNTER — Ambulatory Visit: Payer: Medicare HMO | Admitting: Speech Pathology

## 2021-06-14 ENCOUNTER — Other Ambulatory Visit: Payer: Self-pay

## 2021-06-14 DIAGNOSIS — R41841 Cognitive communication deficit: Secondary | ICD-10-CM | POA: Diagnosis not present

## 2021-06-14 DIAGNOSIS — R262 Difficulty in walking, not elsewhere classified: Secondary | ICD-10-CM

## 2021-06-14 DIAGNOSIS — M6281 Muscle weakness (generalized): Secondary | ICD-10-CM

## 2021-06-14 DIAGNOSIS — R269 Unspecified abnormalities of gait and mobility: Secondary | ICD-10-CM | POA: Diagnosis not present

## 2021-06-14 DIAGNOSIS — I63511 Cerebral infarction due to unspecified occlusion or stenosis of right middle cerebral artery: Secondary | ICD-10-CM | POA: Diagnosis not present

## 2021-06-14 DIAGNOSIS — R1312 Dysphagia, oropharyngeal phase: Secondary | ICD-10-CM | POA: Diagnosis not present

## 2021-06-14 DIAGNOSIS — R2681 Unsteadiness on feet: Secondary | ICD-10-CM | POA: Diagnosis not present

## 2021-06-14 DIAGNOSIS — R278 Other lack of coordination: Secondary | ICD-10-CM | POA: Diagnosis not present

## 2021-06-14 LAB — COLOGUARD: Cologuard: NEGATIVE
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
## 2021-06-19 ENCOUNTER — Ambulatory Visit: Payer: Medicare HMO

## 2021-06-19 ENCOUNTER — Ambulatory Visit: Payer: Medicare HMO | Admitting: Speech Pathology

## 2021-06-19 ENCOUNTER — Ambulatory Visit: Payer: Medicare HMO | Admitting: Occupational Therapy

## 2021-06-19 ENCOUNTER — Other Ambulatory Visit: Payer: Self-pay

## 2021-06-19 DIAGNOSIS — R269 Unspecified abnormalities of gait and mobility: Secondary | ICD-10-CM | POA: Diagnosis not present

## 2021-06-19 DIAGNOSIS — R262 Difficulty in walking, not elsewhere classified: Secondary | ICD-10-CM

## 2021-06-19 DIAGNOSIS — R41841 Cognitive communication deficit: Secondary | ICD-10-CM

## 2021-06-19 DIAGNOSIS — R1312 Dysphagia, oropharyngeal phase: Secondary | ICD-10-CM | POA: Diagnosis not present

## 2021-06-19 DIAGNOSIS — M6281 Muscle weakness (generalized): Secondary | ICD-10-CM

## 2021-06-19 DIAGNOSIS — R278 Other lack of coordination: Secondary | ICD-10-CM

## 2021-06-19 DIAGNOSIS — I63511 Cerebral infarction due to unspecified occlusion or stenosis of right middle cerebral artery: Secondary | ICD-10-CM | POA: Diagnosis not present

## 2021-06-19 DIAGNOSIS — R2681 Unsteadiness on feet: Secondary | ICD-10-CM | POA: Diagnosis not present
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
## 2021-06-20 ENCOUNTER — Ambulatory Visit: Payer: Medicare HMO | Admitting: Occupational Therapy

## 2021-06-20 ENCOUNTER — Encounter: Payer: Self-pay | Admitting: Occupational Therapy

## 2021-06-20 DIAGNOSIS — R262 Difficulty in walking, not elsewhere classified: Secondary | ICD-10-CM | POA: Diagnosis not present

## 2021-06-20 DIAGNOSIS — R41841 Cognitive communication deficit: Secondary | ICD-10-CM | POA: Diagnosis not present

## 2021-06-20 DIAGNOSIS — M6281 Muscle weakness (generalized): Secondary | ICD-10-CM

## 2021-06-20 DIAGNOSIS — R1312 Dysphagia, oropharyngeal phase: Secondary | ICD-10-CM | POA: Diagnosis not present

## 2021-06-20 DIAGNOSIS — R278 Other lack of coordination: Secondary | ICD-10-CM | POA: Diagnosis not present

## 2021-06-20 DIAGNOSIS — R269 Unspecified abnormalities of gait and mobility: Secondary | ICD-10-CM | POA: Diagnosis not present

## 2021-06-20 DIAGNOSIS — I63511 Cerebral infarction due to unspecified occlusion or stenosis of right middle cerebral artery: Secondary | ICD-10-CM | POA: Diagnosis not present

## 2021-06-20 DIAGNOSIS — R2681 Unsteadiness on feet: Secondary | ICD-10-CM | POA: Diagnosis not present
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
## 2021-06-21 ENCOUNTER — Ambulatory Visit: Payer: Medicare HMO | Admitting: Speech Pathology

## 2021-06-21 ENCOUNTER — Ambulatory Visit: Payer: Medicare HMO | Admitting: Occupational Therapy

## 2021-06-21 ENCOUNTER — Other Ambulatory Visit: Payer: Self-pay

## 2021-06-21 ENCOUNTER — Ambulatory Visit: Payer: Medicare HMO

## 2021-06-21 DIAGNOSIS — I63511 Cerebral infarction due to unspecified occlusion or stenosis of right middle cerebral artery: Secondary | ICD-10-CM | POA: Diagnosis not present

## 2021-06-21 DIAGNOSIS — M6281 Muscle weakness (generalized): Secondary | ICD-10-CM

## 2021-06-21 DIAGNOSIS — R262 Difficulty in walking, not elsewhere classified: Secondary | ICD-10-CM | POA: Diagnosis not present

## 2021-06-21 DIAGNOSIS — R41841 Cognitive communication deficit: Secondary | ICD-10-CM | POA: Diagnosis not present

## 2021-06-21 DIAGNOSIS — R278 Other lack of coordination: Secondary | ICD-10-CM | POA: Diagnosis not present

## 2021-06-21 DIAGNOSIS — R2681 Unsteadiness on feet: Secondary | ICD-10-CM | POA: Diagnosis not present

## 2021-06-21 DIAGNOSIS — R1312 Dysphagia, oropharyngeal phase: Secondary | ICD-10-CM | POA: Diagnosis not present

## 2021-06-21 DIAGNOSIS — R269 Unspecified abnormalities of gait and mobility: Secondary | ICD-10-CM | POA: Diagnosis not present
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
## 2021-06-26 ENCOUNTER — Other Ambulatory Visit: Payer: Self-pay

## 2021-06-26 ENCOUNTER — Ambulatory Visit: Payer: Medicare HMO

## 2021-06-26 ENCOUNTER — Ambulatory Visit: Payer: Medicare HMO | Admitting: Occupational Therapy

## 2021-06-26 ENCOUNTER — Ambulatory Visit: Payer: Medicare HMO | Admitting: Speech Pathology

## 2021-06-26 ENCOUNTER — Encounter: Payer: Self-pay | Admitting: Occupational Therapy

## 2021-06-26 DIAGNOSIS — R41841 Cognitive communication deficit: Secondary | ICD-10-CM | POA: Diagnosis not present

## 2021-06-26 DIAGNOSIS — I63511 Cerebral infarction due to unspecified occlusion or stenosis of right middle cerebral artery: Secondary | ICD-10-CM | POA: Diagnosis not present

## 2021-06-26 DIAGNOSIS — R1312 Dysphagia, oropharyngeal phase: Secondary | ICD-10-CM | POA: Diagnosis not present

## 2021-06-26 DIAGNOSIS — M6281 Muscle weakness (generalized): Secondary | ICD-10-CM | POA: Diagnosis not present

## 2021-06-26 DIAGNOSIS — R269 Unspecified abnormalities of gait and mobility: Secondary | ICD-10-CM | POA: Diagnosis not present

## 2021-06-26 DIAGNOSIS — R262 Difficulty in walking, not elsewhere classified: Secondary | ICD-10-CM

## 2021-06-26 DIAGNOSIS — R278 Other lack of coordination: Secondary | ICD-10-CM | POA: Diagnosis not present

## 2021-06-26 DIAGNOSIS — R2681 Unsteadiness on feet: Secondary | ICD-10-CM | POA: Diagnosis not present
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
## 2021-06-27 ENCOUNTER — Encounter: Payer: Medicare HMO | Admitting: Occupational Therapy

## 2021-06-27 ENCOUNTER — Telehealth: Payer: Self-pay

## 2021-06-27 ENCOUNTER — Other Ambulatory Visit: Payer: Self-pay | Admitting: Nurse Practitioner

## 2021-06-27 NOTE — Telephone Encounter (Signed)
Medical record request and records faxed to [facility] at [phone].

## 2021-06-28 ENCOUNTER — Other Ambulatory Visit: Payer: Self-pay

## 2021-06-28 ENCOUNTER — Encounter: Payer: Medicare HMO | Admitting: Speech Pathology

## 2021-06-28 ENCOUNTER — Ambulatory Visit: Payer: Medicare HMO | Admitting: Occupational Therapy

## 2021-06-28 ENCOUNTER — Ambulatory Visit: Payer: Medicare HMO

## 2021-06-28 DIAGNOSIS — I63511 Cerebral infarction due to unspecified occlusion or stenosis of right middle cerebral artery: Secondary | ICD-10-CM | POA: Diagnosis not present

## 2021-06-28 DIAGNOSIS — R269 Unspecified abnormalities of gait and mobility: Secondary | ICD-10-CM | POA: Diagnosis not present

## 2021-06-28 DIAGNOSIS — R2681 Unsteadiness on feet: Secondary | ICD-10-CM | POA: Diagnosis not present

## 2021-06-28 DIAGNOSIS — M6281 Muscle weakness (generalized): Secondary | ICD-10-CM

## 2021-06-28 DIAGNOSIS — R278 Other lack of coordination: Secondary | ICD-10-CM | POA: Diagnosis not present

## 2021-06-28 DIAGNOSIS — R262 Difficulty in walking, not elsewhere classified: Secondary | ICD-10-CM | POA: Diagnosis not present

## 2021-06-28 DIAGNOSIS — R1312 Dysphagia, oropharyngeal phase: Secondary | ICD-10-CM | POA: Diagnosis not present

## 2021-06-28 DIAGNOSIS — R41841 Cognitive communication deficit: Secondary | ICD-10-CM | POA: Diagnosis not present

## 2021-06-29 ENCOUNTER — Encounter: Payer: Self-pay | Admitting: Occupational Therapy
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
## 2021-07-03 ENCOUNTER — Ambulatory Visit: Payer: Medicare HMO | Admitting: Speech Pathology

## 2021-07-03 ENCOUNTER — Other Ambulatory Visit: Payer: Self-pay

## 2021-07-03 ENCOUNTER — Encounter: Payer: Self-pay | Admitting: Occupational Therapy

## 2021-07-03 ENCOUNTER — Ambulatory Visit: Payer: Medicare HMO | Admitting: Occupational Therapy

## 2021-07-03 ENCOUNTER — Ambulatory Visit: Payer: Medicare HMO

## 2021-07-03 DIAGNOSIS — R262 Difficulty in walking, not elsewhere classified: Secondary | ICD-10-CM | POA: Diagnosis not present

## 2021-07-03 DIAGNOSIS — R278 Other lack of coordination: Secondary | ICD-10-CM | POA: Diagnosis not present

## 2021-07-03 DIAGNOSIS — R2681 Unsteadiness on feet: Secondary | ICD-10-CM

## 2021-07-03 DIAGNOSIS — R41841 Cognitive communication deficit: Secondary | ICD-10-CM

## 2021-07-03 DIAGNOSIS — M6281 Muscle weakness (generalized): Secondary | ICD-10-CM

## 2021-07-03 DIAGNOSIS — R269 Unspecified abnormalities of gait and mobility: Secondary | ICD-10-CM | POA: Diagnosis not present

## 2021-07-03 DIAGNOSIS — I63511 Cerebral infarction due to unspecified occlusion or stenosis of right middle cerebral artery: Secondary | ICD-10-CM | POA: Diagnosis not present

## 2021-07-03 DIAGNOSIS — R1312 Dysphagia, oropharyngeal phase: Secondary | ICD-10-CM | POA: Diagnosis not present
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
## 2021-07-04 ENCOUNTER — Ambulatory Visit: Payer: Medicare HMO | Admitting: Occupational Therapy

## 2021-07-04 DIAGNOSIS — R2681 Unsteadiness on feet: Secondary | ICD-10-CM | POA: Diagnosis not present

## 2021-07-04 DIAGNOSIS — I63511 Cerebral infarction due to unspecified occlusion or stenosis of right middle cerebral artery: Secondary | ICD-10-CM | POA: Diagnosis not present

## 2021-07-04 DIAGNOSIS — M6281 Muscle weakness (generalized): Secondary | ICD-10-CM | POA: Diagnosis not present

## 2021-07-04 DIAGNOSIS — R269 Unspecified abnormalities of gait and mobility: Secondary | ICD-10-CM | POA: Diagnosis not present

## 2021-07-04 DIAGNOSIS — R262 Difficulty in walking, not elsewhere classified: Secondary | ICD-10-CM | POA: Diagnosis not present

## 2021-07-04 DIAGNOSIS — R278 Other lack of coordination: Secondary | ICD-10-CM | POA: Diagnosis not present

## 2021-07-04 DIAGNOSIS — R41841 Cognitive communication deficit: Secondary | ICD-10-CM | POA: Diagnosis not present

## 2021-07-04 DIAGNOSIS — R1312 Dysphagia, oropharyngeal phase: Secondary | ICD-10-CM | POA: Diagnosis not present
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
## 2021-07-05 ENCOUNTER — Ambulatory Visit: Payer: Medicare HMO

## 2021-07-05 ENCOUNTER — Other Ambulatory Visit: Payer: Self-pay

## 2021-07-05 ENCOUNTER — Ambulatory Visit: Payer: Medicare HMO | Admitting: Occupational Therapy

## 2021-07-05 ENCOUNTER — Encounter: Payer: Medicare HMO | Admitting: Speech Pathology

## 2021-07-05 ENCOUNTER — Encounter: Payer: Self-pay | Admitting: Occupational Therapy

## 2021-07-05 DIAGNOSIS — R1312 Dysphagia, oropharyngeal phase: Secondary | ICD-10-CM | POA: Diagnosis not present

## 2021-07-05 DIAGNOSIS — M6281 Muscle weakness (generalized): Secondary | ICD-10-CM

## 2021-07-05 DIAGNOSIS — R41841 Cognitive communication deficit: Secondary | ICD-10-CM | POA: Diagnosis not present

## 2021-07-05 DIAGNOSIS — R278 Other lack of coordination: Secondary | ICD-10-CM | POA: Diagnosis not present

## 2021-07-05 DIAGNOSIS — R2681 Unsteadiness on feet: Secondary | ICD-10-CM | POA: Diagnosis not present

## 2021-07-05 DIAGNOSIS — I63511 Cerebral infarction due to unspecified occlusion or stenosis of right middle cerebral artery: Secondary | ICD-10-CM | POA: Diagnosis not present

## 2021-07-05 DIAGNOSIS — R269 Unspecified abnormalities of gait and mobility: Secondary | ICD-10-CM | POA: Diagnosis not present

## 2021-07-05 DIAGNOSIS — R262 Difficulty in walking, not elsewhere classified: Secondary | ICD-10-CM | POA: Diagnosis not present
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
## 2021-07-06 ENCOUNTER — Other Ambulatory Visit: Payer: Self-pay

## 2021-07-06 ENCOUNTER — Encounter: Payer: Medicare HMO | Attending: Physical Medicine & Rehabilitation | Admitting: Physical Medicine & Rehabilitation

## 2021-07-06 ENCOUNTER — Encounter: Payer: Self-pay | Admitting: Physical Medicine & Rehabilitation

## 2021-07-06 VITALS — BP 161/83 | HR 73 | Temp 98.1°F | Ht 70.0 in | Wt 170.6 lb

## 2021-07-06 DIAGNOSIS — I69354 Hemiplegia and hemiparesis following cerebral infarction affecting left non-dominant side: Secondary | ICD-10-CM | POA: Insufficient documentation

## 2021-07-06 DIAGNOSIS — I63511 Cerebral infarction due to unspecified occlusion or stenosis of right middle cerebral artery: Secondary | ICD-10-CM | POA: Diagnosis not present
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
## 2021-07-06 NOTE — Patient Instructions (Signed)
Please call if shoulder pain increased we can do Left shoulder injection

## 2021-07-10 ENCOUNTER — Other Ambulatory Visit: Payer: Self-pay

## 2021-07-10 ENCOUNTER — Ambulatory Visit: Payer: Medicare HMO | Attending: Physician Assistant | Admitting: Occupational Therapy

## 2021-07-10 ENCOUNTER — Ambulatory Visit: Payer: Medicare HMO | Admitting: Speech Pathology

## 2021-07-10 ENCOUNTER — Ambulatory Visit: Payer: Medicare HMO

## 2021-07-10 DIAGNOSIS — R41841 Cognitive communication deficit: Secondary | ICD-10-CM

## 2021-07-10 DIAGNOSIS — I63511 Cerebral infarction due to unspecified occlusion or stenosis of right middle cerebral artery: Secondary | ICD-10-CM

## 2021-07-10 DIAGNOSIS — R482 Apraxia: Secondary | ICD-10-CM | POA: Diagnosis not present

## 2021-07-10 DIAGNOSIS — R2681 Unsteadiness on feet: Secondary | ICD-10-CM

## 2021-07-10 DIAGNOSIS — M6281 Muscle weakness (generalized): Secondary | ICD-10-CM

## 2021-07-10 DIAGNOSIS — R278 Other lack of coordination: Secondary | ICD-10-CM | POA: Insufficient documentation

## 2021-07-10 DIAGNOSIS — R262 Difficulty in walking, not elsewhere classified: Secondary | ICD-10-CM | POA: Diagnosis not present

## 2021-07-10 DIAGNOSIS — R269 Unspecified abnormalities of gait and mobility: Secondary | ICD-10-CM | POA: Insufficient documentation
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
## 2021-07-11 ENCOUNTER — Ambulatory Visit: Payer: Medicare HMO | Admitting: Occupational Therapy

## 2021-07-11 DIAGNOSIS — R278 Other lack of coordination: Secondary | ICD-10-CM | POA: Diagnosis not present

## 2021-07-11 DIAGNOSIS — R2681 Unsteadiness on feet: Secondary | ICD-10-CM | POA: Diagnosis not present

## 2021-07-11 DIAGNOSIS — R269 Unspecified abnormalities of gait and mobility: Secondary | ICD-10-CM | POA: Diagnosis not present

## 2021-07-11 DIAGNOSIS — R41841 Cognitive communication deficit: Secondary | ICD-10-CM | POA: Diagnosis not present

## 2021-07-11 DIAGNOSIS — I63511 Cerebral infarction due to unspecified occlusion or stenosis of right middle cerebral artery: Secondary | ICD-10-CM | POA: Diagnosis not present

## 2021-07-11 DIAGNOSIS — M6281 Muscle weakness (generalized): Secondary | ICD-10-CM | POA: Diagnosis not present

## 2021-07-11 DIAGNOSIS — R262 Difficulty in walking, not elsewhere classified: Secondary | ICD-10-CM | POA: Diagnosis not present

## 2021-07-11 DIAGNOSIS — R482 Apraxia: Secondary | ICD-10-CM | POA: Diagnosis not present
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
## 2021-07-12 ENCOUNTER — Encounter: Payer: Self-pay | Admitting: Occupational Therapy

## 2021-07-12 ENCOUNTER — Encounter: Payer: Medicare HMO | Admitting: Speech Pathology

## 2021-07-12 ENCOUNTER — Ambulatory Visit: Payer: Medicare HMO

## 2021-07-12 ENCOUNTER — Other Ambulatory Visit: Payer: Self-pay

## 2021-07-12 ENCOUNTER — Ambulatory Visit: Payer: Medicare HMO | Admitting: Occupational Therapy

## 2021-07-12 DIAGNOSIS — R269 Unspecified abnormalities of gait and mobility: Secondary | ICD-10-CM | POA: Diagnosis not present

## 2021-07-12 DIAGNOSIS — I63511 Cerebral infarction due to unspecified occlusion or stenosis of right middle cerebral artery: Secondary | ICD-10-CM | POA: Diagnosis not present

## 2021-07-12 DIAGNOSIS — R2681 Unsteadiness on feet: Secondary | ICD-10-CM | POA: Diagnosis not present

## 2021-07-12 DIAGNOSIS — M6281 Muscle weakness (generalized): Secondary | ICD-10-CM | POA: Diagnosis not present

## 2021-07-12 DIAGNOSIS — R41841 Cognitive communication deficit: Secondary | ICD-10-CM | POA: Diagnosis not present

## 2021-07-12 DIAGNOSIS — R278 Other lack of coordination: Secondary | ICD-10-CM | POA: Diagnosis not present

## 2021-07-12 DIAGNOSIS — R262 Difficulty in walking, not elsewhere classified: Secondary | ICD-10-CM | POA: Diagnosis not present

## 2021-07-12 DIAGNOSIS — R482 Apraxia: Secondary | ICD-10-CM | POA: Diagnosis not present
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
## 2021-07-17 ENCOUNTER — Ambulatory Visit: Payer: Medicare HMO | Admitting: Occupational Therapy

## 2021-07-17 ENCOUNTER — Ambulatory Visit: Payer: Medicare HMO

## 2021-07-17 ENCOUNTER — Other Ambulatory Visit: Payer: Self-pay

## 2021-07-17 ENCOUNTER — Ambulatory Visit: Payer: Medicare HMO | Admitting: Speech Pathology

## 2021-07-17 DIAGNOSIS — R41841 Cognitive communication deficit: Secondary | ICD-10-CM | POA: Diagnosis not present

## 2021-07-17 DIAGNOSIS — M6281 Muscle weakness (generalized): Secondary | ICD-10-CM

## 2021-07-17 DIAGNOSIS — R269 Unspecified abnormalities of gait and mobility: Secondary | ICD-10-CM | POA: Diagnosis not present

## 2021-07-17 DIAGNOSIS — R262 Difficulty in walking, not elsewhere classified: Secondary | ICD-10-CM

## 2021-07-17 DIAGNOSIS — I63511 Cerebral infarction due to unspecified occlusion or stenosis of right middle cerebral artery: Secondary | ICD-10-CM | POA: Diagnosis not present

## 2021-07-17 DIAGNOSIS — R278 Other lack of coordination: Secondary | ICD-10-CM | POA: Diagnosis not present

## 2021-07-17 DIAGNOSIS — R2681 Unsteadiness on feet: Secondary | ICD-10-CM

## 2021-07-17 DIAGNOSIS — R482 Apraxia: Secondary | ICD-10-CM | POA: Diagnosis not present
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
## 2021-07-18 ENCOUNTER — Ambulatory Visit: Payer: Medicare HMO | Admitting: Occupational Therapy

## 2021-07-18 DIAGNOSIS — R269 Unspecified abnormalities of gait and mobility: Secondary | ICD-10-CM | POA: Diagnosis not present

## 2021-07-18 DIAGNOSIS — R278 Other lack of coordination: Secondary | ICD-10-CM

## 2021-07-18 DIAGNOSIS — R482 Apraxia: Secondary | ICD-10-CM | POA: Diagnosis not present

## 2021-07-18 DIAGNOSIS — M6281 Muscle weakness (generalized): Secondary | ICD-10-CM

## 2021-07-18 DIAGNOSIS — R41841 Cognitive communication deficit: Secondary | ICD-10-CM | POA: Diagnosis not present

## 2021-07-18 DIAGNOSIS — I63511 Cerebral infarction due to unspecified occlusion or stenosis of right middle cerebral artery: Secondary | ICD-10-CM

## 2021-07-18 DIAGNOSIS — R2681 Unsteadiness on feet: Secondary | ICD-10-CM | POA: Diagnosis not present

## 2021-07-18 DIAGNOSIS — R262 Difficulty in walking, not elsewhere classified: Secondary | ICD-10-CM | POA: Diagnosis not present
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
## 2021-07-19 ENCOUNTER — Ambulatory Visit: Payer: Medicare HMO

## 2021-07-19 ENCOUNTER — Encounter: Payer: Self-pay | Admitting: Occupational Therapy

## 2021-07-19 ENCOUNTER — Encounter: Payer: Medicare HMO | Admitting: Speech Pathology

## 2021-07-19 ENCOUNTER — Ambulatory Visit: Payer: Medicare HMO | Admitting: Occupational Therapy

## 2021-07-19 ENCOUNTER — Other Ambulatory Visit: Payer: Self-pay

## 2021-07-19 DIAGNOSIS — R278 Other lack of coordination: Secondary | ICD-10-CM | POA: Diagnosis not present

## 2021-07-19 DIAGNOSIS — M6281 Muscle weakness (generalized): Secondary | ICD-10-CM

## 2021-07-19 DIAGNOSIS — I63511 Cerebral infarction due to unspecified occlusion or stenosis of right middle cerebral artery: Secondary | ICD-10-CM | POA: Diagnosis not present

## 2021-07-19 DIAGNOSIS — R269 Unspecified abnormalities of gait and mobility: Secondary | ICD-10-CM | POA: Diagnosis not present

## 2021-07-19 DIAGNOSIS — R482 Apraxia: Secondary | ICD-10-CM | POA: Diagnosis not present

## 2021-07-19 DIAGNOSIS — R262 Difficulty in walking, not elsewhere classified: Secondary | ICD-10-CM | POA: Diagnosis not present

## 2021-07-19 DIAGNOSIS — R2681 Unsteadiness on feet: Secondary | ICD-10-CM | POA: Diagnosis not present

## 2021-07-19 DIAGNOSIS — R41841 Cognitive communication deficit: Secondary | ICD-10-CM | POA: Diagnosis not present
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
## 2021-07-24 ENCOUNTER — Ambulatory Visit: Payer: Medicare HMO

## 2021-07-24 ENCOUNTER — Other Ambulatory Visit: Payer: Self-pay

## 2021-07-24 ENCOUNTER — Ambulatory Visit: Payer: Medicare HMO | Admitting: Speech Pathology

## 2021-07-24 DIAGNOSIS — R41841 Cognitive communication deficit: Secondary | ICD-10-CM

## 2021-07-24 DIAGNOSIS — R262 Difficulty in walking, not elsewhere classified: Secondary | ICD-10-CM | POA: Diagnosis not present

## 2021-07-24 DIAGNOSIS — R482 Apraxia: Secondary | ICD-10-CM | POA: Diagnosis not present

## 2021-07-24 DIAGNOSIS — R269 Unspecified abnormalities of gait and mobility: Secondary | ICD-10-CM | POA: Diagnosis not present

## 2021-07-24 DIAGNOSIS — R2681 Unsteadiness on feet: Secondary | ICD-10-CM | POA: Diagnosis not present

## 2021-07-24 DIAGNOSIS — I63511 Cerebral infarction due to unspecified occlusion or stenosis of right middle cerebral artery: Secondary | ICD-10-CM | POA: Diagnosis not present

## 2021-07-24 DIAGNOSIS — R278 Other lack of coordination: Secondary | ICD-10-CM | POA: Diagnosis not present

## 2021-07-24 DIAGNOSIS — M6281 Muscle weakness (generalized): Secondary | ICD-10-CM
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
## 2021-07-25 ENCOUNTER — Ambulatory Visit: Payer: Medicare HMO | Admitting: Occupational Therapy

## 2021-07-25 ENCOUNTER — Encounter: Payer: Self-pay | Admitting: Occupational Therapy

## 2021-07-25 DIAGNOSIS — R269 Unspecified abnormalities of gait and mobility: Secondary | ICD-10-CM | POA: Diagnosis not present

## 2021-07-25 DIAGNOSIS — I63511 Cerebral infarction due to unspecified occlusion or stenosis of right middle cerebral artery: Secondary | ICD-10-CM | POA: Diagnosis not present

## 2021-07-25 DIAGNOSIS — R41841 Cognitive communication deficit: Secondary | ICD-10-CM | POA: Diagnosis not present

## 2021-07-25 DIAGNOSIS — R278 Other lack of coordination: Secondary | ICD-10-CM | POA: Diagnosis not present

## 2021-07-25 DIAGNOSIS — M6281 Muscle weakness (generalized): Secondary | ICD-10-CM | POA: Diagnosis not present

## 2021-07-25 DIAGNOSIS — R2681 Unsteadiness on feet: Secondary | ICD-10-CM | POA: Diagnosis not present

## 2021-07-25 DIAGNOSIS — R262 Difficulty in walking, not elsewhere classified: Secondary | ICD-10-CM | POA: Diagnosis not present

## 2021-07-25 DIAGNOSIS — R482 Apraxia: Secondary | ICD-10-CM | POA: Diagnosis not present
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
## 2021-07-26 ENCOUNTER — Encounter: Payer: Self-pay | Admitting: Occupational Therapy

## 2021-07-26 ENCOUNTER — Telehealth: Payer: Self-pay | Admitting: Radiology

## 2021-07-26 ENCOUNTER — Ambulatory Visit: Payer: Medicare HMO

## 2021-07-26 ENCOUNTER — Other Ambulatory Visit: Payer: Self-pay | Admitting: Nurse Practitioner

## 2021-07-26 ENCOUNTER — Other Ambulatory Visit: Payer: Self-pay

## 2021-07-26 ENCOUNTER — Ambulatory Visit: Payer: Medicare HMO | Admitting: Occupational Therapy

## 2021-07-26 ENCOUNTER — Encounter: Payer: Medicare HMO | Admitting: Speech Pathology

## 2021-07-26 DIAGNOSIS — R482 Apraxia: Secondary | ICD-10-CM | POA: Diagnosis not present

## 2021-07-26 DIAGNOSIS — R41841 Cognitive communication deficit: Secondary | ICD-10-CM | POA: Diagnosis not present

## 2021-07-26 DIAGNOSIS — R2681 Unsteadiness on feet: Secondary | ICD-10-CM | POA: Diagnosis not present

## 2021-07-26 DIAGNOSIS — I63511 Cerebral infarction due to unspecified occlusion or stenosis of right middle cerebral artery: Secondary | ICD-10-CM | POA: Diagnosis not present

## 2021-07-26 DIAGNOSIS — R278 Other lack of coordination: Secondary | ICD-10-CM | POA: Diagnosis not present

## 2021-07-26 DIAGNOSIS — R262 Difficulty in walking, not elsewhere classified: Secondary | ICD-10-CM | POA: Diagnosis not present

## 2021-07-26 DIAGNOSIS — R269 Unspecified abnormalities of gait and mobility: Secondary | ICD-10-CM | POA: Diagnosis not present

## 2021-07-26 DIAGNOSIS — M6281 Muscle weakness (generalized): Secondary | ICD-10-CM | POA: Diagnosis not present
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
## 2021-07-26 NOTE — Progress Notes (Signed)
   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   Dr Corliss Skains does give permission to DC Brilinta But Change ASA dose to 325 mg daily  Spoke to wife Leonard [name]; she has full understanding of this plan. She says they will finish off Brilinta Rx (2 more days) Stop Brilinta then and change to ASA 325 mg daily.

## 2021-07-31 ENCOUNTER — Other Ambulatory Visit: Payer: Self-pay

## 2021-07-31 ENCOUNTER — Ambulatory Visit: Payer: Medicare HMO

## 2021-07-31 ENCOUNTER — Ambulatory Visit: Payer: Medicare HMO | Admitting: Speech Pathology

## 2021-07-31 DIAGNOSIS — M6281 Muscle weakness (generalized): Secondary | ICD-10-CM

## 2021-07-31 DIAGNOSIS — R262 Difficulty in walking, not elsewhere classified: Secondary | ICD-10-CM | POA: Diagnosis not present

## 2021-07-31 DIAGNOSIS — R41841 Cognitive communication deficit: Secondary | ICD-10-CM | POA: Diagnosis not present

## 2021-07-31 DIAGNOSIS — R482 Apraxia: Secondary | ICD-10-CM | POA: Diagnosis not present

## 2021-07-31 DIAGNOSIS — I63511 Cerebral infarction due to unspecified occlusion or stenosis of right middle cerebral artery: Secondary | ICD-10-CM | POA: Diagnosis not present

## 2021-07-31 DIAGNOSIS — R278 Other lack of coordination: Secondary | ICD-10-CM

## 2021-07-31 DIAGNOSIS — R269 Unspecified abnormalities of gait and mobility: Secondary | ICD-10-CM | POA: Diagnosis not present

## 2021-07-31 DIAGNOSIS — R2681 Unsteadiness on feet: Secondary | ICD-10-CM | POA: Diagnosis not present
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
## 2021-08-01 ENCOUNTER — Encounter: Payer: Self-pay | Admitting: Occupational Therapy

## 2021-08-01 ENCOUNTER — Ambulatory Visit: Payer: Medicare HMO | Admitting: Occupational Therapy

## 2021-08-01 DIAGNOSIS — R269 Unspecified abnormalities of gait and mobility: Secondary | ICD-10-CM | POA: Diagnosis not present

## 2021-08-01 DIAGNOSIS — R41841 Cognitive communication deficit: Secondary | ICD-10-CM | POA: Diagnosis not present

## 2021-08-01 DIAGNOSIS — R278 Other lack of coordination: Secondary | ICD-10-CM | POA: Diagnosis not present

## 2021-08-01 DIAGNOSIS — I63511 Cerebral infarction due to unspecified occlusion or stenosis of right middle cerebral artery: Secondary | ICD-10-CM | POA: Diagnosis not present

## 2021-08-01 DIAGNOSIS — R262 Difficulty in walking, not elsewhere classified: Secondary | ICD-10-CM | POA: Diagnosis not present

## 2021-08-01 DIAGNOSIS — R482 Apraxia: Secondary | ICD-10-CM | POA: Diagnosis not present

## 2021-08-01 DIAGNOSIS — M6281 Muscle weakness (generalized): Secondary | ICD-10-CM

## 2021-08-01 DIAGNOSIS — R2681 Unsteadiness on feet: Secondary | ICD-10-CM | POA: Diagnosis not present
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
## 2021-08-02 ENCOUNTER — Encounter: Payer: Medicare HMO | Admitting: Speech Pathology

## 2021-08-02 ENCOUNTER — Other Ambulatory Visit: Payer: Self-pay

## 2021-08-02 ENCOUNTER — Encounter: Payer: Self-pay | Admitting: Occupational Therapy

## 2021-08-02 ENCOUNTER — Ambulatory Visit: Payer: Medicare HMO | Admitting: Occupational Therapy

## 2021-08-02 ENCOUNTER — Ambulatory Visit: Payer: Medicare HMO

## 2021-08-02 DIAGNOSIS — R278 Other lack of coordination: Secondary | ICD-10-CM | POA: Diagnosis not present

## 2021-08-02 DIAGNOSIS — R2681 Unsteadiness on feet: Secondary | ICD-10-CM | POA: Diagnosis not present

## 2021-08-02 DIAGNOSIS — R269 Unspecified abnormalities of gait and mobility: Secondary | ICD-10-CM | POA: Diagnosis not present

## 2021-08-02 DIAGNOSIS — R482 Apraxia: Secondary | ICD-10-CM | POA: Diagnosis not present

## 2021-08-02 DIAGNOSIS — M6281 Muscle weakness (generalized): Secondary | ICD-10-CM | POA: Diagnosis not present

## 2021-08-02 DIAGNOSIS — R262 Difficulty in walking, not elsewhere classified: Secondary | ICD-10-CM | POA: Diagnosis not present

## 2021-08-02 DIAGNOSIS — R41841 Cognitive communication deficit: Secondary | ICD-10-CM | POA: Diagnosis not present

## 2021-08-02 DIAGNOSIS — I63511 Cerebral infarction due to unspecified occlusion or stenosis of right middle cerebral artery: Secondary | ICD-10-CM | POA: Diagnosis not present
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
## 2021-08-07 ENCOUNTER — Ambulatory Visit: Payer: Medicare HMO

## 2021-08-07 ENCOUNTER — Other Ambulatory Visit: Payer: Self-pay

## 2021-08-07 ENCOUNTER — Ambulatory Visit: Payer: Medicare HMO | Attending: Physician Assistant | Admitting: Speech Pathology

## 2021-08-07 DIAGNOSIS — R482 Apraxia: Secondary | ICD-10-CM

## 2021-08-07 DIAGNOSIS — R278 Other lack of coordination: Secondary | ICD-10-CM

## 2021-08-07 DIAGNOSIS — I63511 Cerebral infarction due to unspecified occlusion or stenosis of right middle cerebral artery: Secondary | ICD-10-CM | POA: Insufficient documentation

## 2021-08-07 DIAGNOSIS — R41841 Cognitive communication deficit: Secondary | ICD-10-CM | POA: Diagnosis not present

## 2021-08-07 DIAGNOSIS — M6281 Muscle weakness (generalized): Secondary | ICD-10-CM

## 2021-08-07 DIAGNOSIS — R1312 Dysphagia, oropharyngeal phase: Secondary | ICD-10-CM | POA: Insufficient documentation
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
## 2021-08-07 NOTE — Patient Instructions (Signed)
Puzzles and games are good, but also look for opportunities to use your thinking skills day-to-day:   Leonard [name], you can be in charge of taking your blood pressure everyday and writing down the number. You can write it on a log, or you can write it in your calendar. The important thing is to be consistent, and do it at the same time every day.   If you want to remember to do something, sometimes it helps to make it a part of a routine that you're already doing. For example, always taking your meds with your meal.   Try being in charge of getting some items from the grocery list when you're at the store. Make a list before you go and only get items from your list. You can test your memory by trying to memorize 5 items. Only pull out your list to double check and make sure you got everything from the list.
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
## 2021-08-08 ENCOUNTER — Encounter: Payer: Self-pay | Admitting: Occupational Therapy

## 2021-08-08 ENCOUNTER — Ambulatory Visit: Payer: Medicare HMO | Admitting: Occupational Therapy

## 2021-08-08 DIAGNOSIS — I63511 Cerebral infarction due to unspecified occlusion or stenosis of right middle cerebral artery: Secondary | ICD-10-CM | POA: Diagnosis not present

## 2021-08-08 DIAGNOSIS — R278 Other lack of coordination: Secondary | ICD-10-CM

## 2021-08-08 DIAGNOSIS — M6281 Muscle weakness (generalized): Secondary | ICD-10-CM | POA: Diagnosis not present

## 2021-08-08 DIAGNOSIS — R1312 Dysphagia, oropharyngeal phase: Secondary | ICD-10-CM | POA: Diagnosis not present

## 2021-08-08 DIAGNOSIS — R41841 Cognitive communication deficit: Secondary | ICD-10-CM | POA: Diagnosis not present

## 2021-08-08 DIAGNOSIS — R482 Apraxia: Secondary | ICD-10-CM | POA: Diagnosis not present
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
## 2021-08-09 ENCOUNTER — Ambulatory Visit: Payer: Medicare HMO | Admitting: Occupational Therapy

## 2021-08-09 ENCOUNTER — Encounter: Payer: Medicare HMO | Admitting: Speech Pathology

## 2021-08-09 ENCOUNTER — Encounter: Payer: Self-pay | Admitting: Occupational Therapy

## 2021-08-09 ENCOUNTER — Other Ambulatory Visit: Payer: Self-pay

## 2021-08-09 ENCOUNTER — Ambulatory Visit: Payer: Medicare HMO

## 2021-08-09 DIAGNOSIS — R278 Other lack of coordination: Secondary | ICD-10-CM | POA: Diagnosis not present

## 2021-08-09 DIAGNOSIS — I63511 Cerebral infarction due to unspecified occlusion or stenosis of right middle cerebral artery: Secondary | ICD-10-CM | POA: Diagnosis not present

## 2021-08-09 DIAGNOSIS — R482 Apraxia: Secondary | ICD-10-CM | POA: Diagnosis not present

## 2021-08-09 DIAGNOSIS — M6281 Muscle weakness (generalized): Secondary | ICD-10-CM | POA: Diagnosis not present

## 2021-08-09 DIAGNOSIS — R41841 Cognitive communication deficit: Secondary | ICD-10-CM | POA: Diagnosis not present

## 2021-08-09 DIAGNOSIS — R1312 Dysphagia, oropharyngeal phase: Secondary | ICD-10-CM | POA: Diagnosis not present
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
## 2021-08-14 ENCOUNTER — Encounter: Payer: Self-pay | Admitting: Occupational Therapy

## 2021-08-14 ENCOUNTER — Ambulatory Visit: Payer: Medicare HMO | Admitting: Speech Pathology

## 2021-08-14 ENCOUNTER — Ambulatory Visit: Payer: Medicare HMO

## 2021-08-14 ENCOUNTER — Ambulatory Visit: Payer: Medicare HMO | Admitting: Occupational Therapy

## 2021-08-14 ENCOUNTER — Other Ambulatory Visit: Payer: Self-pay

## 2021-08-14 DIAGNOSIS — R482 Apraxia: Secondary | ICD-10-CM | POA: Diagnosis not present

## 2021-08-14 DIAGNOSIS — R278 Other lack of coordination: Secondary | ICD-10-CM | POA: Diagnosis not present

## 2021-08-14 DIAGNOSIS — R41841 Cognitive communication deficit: Secondary | ICD-10-CM | POA: Diagnosis not present

## 2021-08-14 DIAGNOSIS — M6281 Muscle weakness (generalized): Secondary | ICD-10-CM

## 2021-08-14 DIAGNOSIS — R1312 Dysphagia, oropharyngeal phase: Secondary | ICD-10-CM | POA: Diagnosis not present

## 2021-08-14 DIAGNOSIS — I63511 Cerebral infarction due to unspecified occlusion or stenosis of right middle cerebral artery: Secondary | ICD-10-CM | POA: Diagnosis not present
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
## 2021-08-15 ENCOUNTER — Encounter: Payer: Self-pay | Admitting: Occupational Therapy

## 2021-08-15 ENCOUNTER — Ambulatory Visit: Payer: Medicare HMO | Admitting: Occupational Therapy

## 2021-08-15 DIAGNOSIS — M6281 Muscle weakness (generalized): Secondary | ICD-10-CM | POA: Diagnosis not present

## 2021-08-15 DIAGNOSIS — R41841 Cognitive communication deficit: Secondary | ICD-10-CM | POA: Diagnosis not present

## 2021-08-15 DIAGNOSIS — R482 Apraxia: Secondary | ICD-10-CM | POA: Diagnosis not present

## 2021-08-15 DIAGNOSIS — R1312 Dysphagia, oropharyngeal phase: Secondary | ICD-10-CM | POA: Diagnosis not present

## 2021-08-15 DIAGNOSIS — R278 Other lack of coordination: Secondary | ICD-10-CM | POA: Diagnosis not present

## 2021-08-15 DIAGNOSIS — I63511 Cerebral infarction due to unspecified occlusion or stenosis of right middle cerebral artery: Secondary | ICD-10-CM | POA: Diagnosis not present
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
## 2021-08-16 ENCOUNTER — Ambulatory Visit: Payer: Medicare HMO

## 2021-08-16 ENCOUNTER — Encounter: Payer: Self-pay | Admitting: Occupational Therapy

## 2021-08-16 ENCOUNTER — Ambulatory Visit: Payer: Medicare HMO | Admitting: Occupational Therapy

## 2021-08-16 ENCOUNTER — Encounter: Payer: Medicare HMO | Admitting: Speech Pathology

## 2021-08-16 ENCOUNTER — Other Ambulatory Visit: Payer: Self-pay

## 2021-08-16 DIAGNOSIS — R1312 Dysphagia, oropharyngeal phase: Secondary | ICD-10-CM | POA: Diagnosis not present

## 2021-08-16 DIAGNOSIS — M6281 Muscle weakness (generalized): Secondary | ICD-10-CM | POA: Diagnosis not present

## 2021-08-16 DIAGNOSIS — I63511 Cerebral infarction due to unspecified occlusion or stenosis of right middle cerebral artery: Secondary | ICD-10-CM | POA: Diagnosis not present

## 2021-08-16 DIAGNOSIS — R278 Other lack of coordination: Secondary | ICD-10-CM | POA: Diagnosis not present

## 2021-08-16 DIAGNOSIS — R482 Apraxia: Secondary | ICD-10-CM | POA: Diagnosis not present

## 2021-08-16 DIAGNOSIS — R41841 Cognitive communication deficit: Secondary | ICD-10-CM | POA: Diagnosis not present
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
## 2021-08-17 ENCOUNTER — Other Ambulatory Visit: Payer: Self-pay | Admitting: Nurse Practitioner

## 2021-08-21 ENCOUNTER — Ambulatory Visit: Payer: Medicare HMO | Admitting: Occupational Therapy

## 2021-08-21 ENCOUNTER — Ambulatory Visit: Payer: Medicare HMO | Admitting: Speech Pathology

## 2021-08-21 ENCOUNTER — Other Ambulatory Visit: Payer: Self-pay

## 2021-08-21 ENCOUNTER — Encounter: Payer: Self-pay | Admitting: Occupational Therapy

## 2021-08-21 ENCOUNTER — Ambulatory Visit: Payer: Medicare HMO

## 2021-08-21 DIAGNOSIS — R41841 Cognitive communication deficit: Secondary | ICD-10-CM

## 2021-08-21 DIAGNOSIS — M6281 Muscle weakness (generalized): Secondary | ICD-10-CM | POA: Diagnosis not present

## 2021-08-21 DIAGNOSIS — R278 Other lack of coordination: Secondary | ICD-10-CM | POA: Diagnosis not present

## 2021-08-21 DIAGNOSIS — I63511 Cerebral infarction due to unspecified occlusion or stenosis of right middle cerebral artery: Secondary | ICD-10-CM | POA: Diagnosis not present

## 2021-08-21 DIAGNOSIS — R1312 Dysphagia, oropharyngeal phase: Secondary | ICD-10-CM | POA: Diagnosis not present

## 2021-08-21 DIAGNOSIS — R482 Apraxia: Secondary | ICD-10-CM | POA: Diagnosis not present
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
## 2021-08-21 NOTE — Patient Instructions (Signed)
Complete take-home activities

## 2021-08-22 ENCOUNTER — Ambulatory Visit: Payer: Medicare HMO | Admitting: Occupational Therapy

## 2021-08-22 DIAGNOSIS — R482 Apraxia: Secondary | ICD-10-CM | POA: Diagnosis not present

## 2021-08-22 DIAGNOSIS — M6281 Muscle weakness (generalized): Secondary | ICD-10-CM

## 2021-08-22 DIAGNOSIS — R278 Other lack of coordination: Secondary | ICD-10-CM

## 2021-08-22 DIAGNOSIS — R1312 Dysphagia, oropharyngeal phase: Secondary | ICD-10-CM | POA: Diagnosis not present

## 2021-08-22 DIAGNOSIS — R41841 Cognitive communication deficit: Secondary | ICD-10-CM | POA: Diagnosis not present

## 2021-08-22 DIAGNOSIS — I63511 Cerebral infarction due to unspecified occlusion or stenosis of right middle cerebral artery: Secondary | ICD-10-CM | POA: Diagnosis not present
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
## 2021-08-23 ENCOUNTER — Ambulatory Visit: Payer: Medicare HMO | Admitting: Occupational Therapy

## 2021-08-23 ENCOUNTER — Encounter: Payer: Medicare HMO | Admitting: Speech Pathology

## 2021-08-23 ENCOUNTER — Other Ambulatory Visit: Payer: Self-pay

## 2021-08-23 ENCOUNTER — Encounter: Payer: Self-pay | Admitting: Occupational Therapy

## 2021-08-23 ENCOUNTER — Ambulatory Visit: Payer: Medicare HMO

## 2021-08-23 DIAGNOSIS — M6281 Muscle weakness (generalized): Secondary | ICD-10-CM | POA: Diagnosis not present

## 2021-08-23 DIAGNOSIS — R482 Apraxia: Secondary | ICD-10-CM | POA: Diagnosis not present

## 2021-08-23 DIAGNOSIS — R41841 Cognitive communication deficit: Secondary | ICD-10-CM | POA: Diagnosis not present

## 2021-08-23 DIAGNOSIS — R278 Other lack of coordination: Secondary | ICD-10-CM | POA: Diagnosis not present

## 2021-08-23 DIAGNOSIS — R1312 Dysphagia, oropharyngeal phase: Secondary | ICD-10-CM | POA: Diagnosis not present

## 2021-08-23 DIAGNOSIS — I63511 Cerebral infarction due to unspecified occlusion or stenosis of right middle cerebral artery: Secondary | ICD-10-CM | POA: Diagnosis not present
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
## 2021-08-25 ENCOUNTER — Other Ambulatory Visit: Payer: Self-pay | Admitting: Nurse Practitioner

## 2021-08-25 DIAGNOSIS — I10 Essential (primary) hypertension: Secondary | ICD-10-CM

## 2021-08-25 DIAGNOSIS — Z7689 Persons encountering health services in other specified circumstances: Secondary | ICD-10-CM

## 2021-08-25 DIAGNOSIS — K219 Gastro-esophageal reflux disease without esophagitis: Secondary | ICD-10-CM

## 2021-08-28 ENCOUNTER — Ambulatory Visit: Payer: Medicare HMO | Admitting: Speech Pathology

## 2021-08-28 ENCOUNTER — Ambulatory Visit: Payer: Medicare HMO | Admitting: Occupational Therapy

## 2021-08-28 ENCOUNTER — Encounter: Payer: Self-pay | Admitting: Occupational Therapy

## 2021-08-28 ENCOUNTER — Ambulatory Visit: Payer: Medicare HMO

## 2021-08-28 ENCOUNTER — Other Ambulatory Visit: Payer: Self-pay

## 2021-08-28 DIAGNOSIS — R482 Apraxia: Secondary | ICD-10-CM | POA: Diagnosis not present

## 2021-08-28 DIAGNOSIS — M6281 Muscle weakness (generalized): Secondary | ICD-10-CM

## 2021-08-28 DIAGNOSIS — R1312 Dysphagia, oropharyngeal phase: Secondary | ICD-10-CM | POA: Diagnosis not present

## 2021-08-28 DIAGNOSIS — R278 Other lack of coordination: Secondary | ICD-10-CM | POA: Diagnosis not present

## 2021-08-28 DIAGNOSIS — R41841 Cognitive communication deficit: Secondary | ICD-10-CM | POA: Diagnosis not present

## 2021-08-28 DIAGNOSIS — I63511 Cerebral infarction due to unspecified occlusion or stenosis of right middle cerebral artery: Secondary | ICD-10-CM | POA: Diagnosis not present
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
## 2021-08-29 ENCOUNTER — Encounter: Payer: Self-pay | Admitting: Occupational Therapy

## 2021-08-29 ENCOUNTER — Ambulatory Visit: Payer: Medicare HMO | Admitting: Occupational Therapy

## 2021-08-29 DIAGNOSIS — M6281 Muscle weakness (generalized): Secondary | ICD-10-CM

## 2021-08-29 DIAGNOSIS — R482 Apraxia: Secondary | ICD-10-CM | POA: Diagnosis not present

## 2021-08-29 DIAGNOSIS — R41841 Cognitive communication deficit: Secondary | ICD-10-CM | POA: Diagnosis not present

## 2021-08-29 DIAGNOSIS — I63511 Cerebral infarction due to unspecified occlusion or stenosis of right middle cerebral artery: Secondary | ICD-10-CM | POA: Diagnosis not present

## 2021-08-29 DIAGNOSIS — R278 Other lack of coordination: Secondary | ICD-10-CM | POA: Diagnosis not present

## 2021-08-29 DIAGNOSIS — R1312 Dysphagia, oropharyngeal phase: Secondary | ICD-10-CM | POA: Diagnosis not present
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
## 2021-09-04 ENCOUNTER — Ambulatory Visit: Payer: Medicare HMO | Admitting: Occupational Therapy

## 2021-09-04 ENCOUNTER — Other Ambulatory Visit: Payer: Self-pay

## 2021-09-04 ENCOUNTER — Ambulatory Visit: Payer: Medicare HMO

## 2021-09-04 ENCOUNTER — Ambulatory Visit: Payer: Medicare HMO | Admitting: Speech Pathology

## 2021-09-04 DIAGNOSIS — R482 Apraxia: Secondary | ICD-10-CM | POA: Diagnosis not present

## 2021-09-04 DIAGNOSIS — M6281 Muscle weakness (generalized): Secondary | ICD-10-CM

## 2021-09-04 DIAGNOSIS — R1312 Dysphagia, oropharyngeal phase: Secondary | ICD-10-CM | POA: Diagnosis not present

## 2021-09-04 DIAGNOSIS — R278 Other lack of coordination: Secondary | ICD-10-CM | POA: Diagnosis not present

## 2021-09-04 DIAGNOSIS — R41841 Cognitive communication deficit: Secondary | ICD-10-CM

## 2021-09-04 DIAGNOSIS — I63511 Cerebral infarction due to unspecified occlusion or stenosis of right middle cerebral artery: Secondary | ICD-10-CM
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
## 2021-09-04 NOTE — Patient Instructions (Signed)
We reassessed your cognition today. Your scores have improved in attention, memory, and executive functions, which are measured in the "mild" range of impairment today for your age.   I noticed that what tends to affect you the most is your attention: getting distracted or sidetracked. This leads you to make mistakes more easily.   We have spent a lot of time discussing and working on strategies to improve your attention. See the handout below for a summary of some of the tips and strategies.   Tips to help facilitate better attention, concentration, focus   Do harder, longer tasks when you are most alert/awake  Break down larger tasks into small parts  Limit distractions of TV, radio, conversation, e mails/texts, appliance noise, etc - if a job is important, do it in a quiet room  Be aware of how you are functioning in high stimulation environments such as large stores, parties, restaurants - any place with lots of lights, noise, signs etc  Group conversations [place] be more difficult to process than one on one conversations  Give yourself extra time to process conversation, reading materials, directions or information from your healthcare providers  Organization is key - clutters of laundry, mail, paperwork, dirty dishes - all make it more difficult to concentrate  Before you start a task, have all the needed supplies, directions, recipes ready and organized. This way you don't have to go looking for something in the middle of a task and become distracted.   Be aware of fatigue - take rests or breaks when needed to re-group and re-focus

## 2021-09-05 ENCOUNTER — Encounter: Payer: Self-pay | Admitting: Occupational Therapy

## 2021-09-05 ENCOUNTER — Ambulatory Visit: Payer: Medicare HMO | Admitting: Occupational Therapy

## 2021-09-05 DIAGNOSIS — R41841 Cognitive communication deficit: Secondary | ICD-10-CM | POA: Diagnosis not present

## 2021-09-05 DIAGNOSIS — R278 Other lack of coordination: Secondary | ICD-10-CM

## 2021-09-05 DIAGNOSIS — R482 Apraxia: Secondary | ICD-10-CM | POA: Diagnosis not present

## 2021-09-05 DIAGNOSIS — M6281 Muscle weakness (generalized): Secondary | ICD-10-CM | POA: Diagnosis not present

## 2021-09-05 DIAGNOSIS — R1312 Dysphagia, oropharyngeal phase: Secondary | ICD-10-CM | POA: Diagnosis not present

## 2021-09-05 DIAGNOSIS — I63511 Cerebral infarction due to unspecified occlusion or stenosis of right middle cerebral artery: Secondary | ICD-10-CM | POA: Diagnosis not present
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
## 2021-09-06 ENCOUNTER — Ambulatory Visit: Payer: Medicare HMO

## 2021-09-06 ENCOUNTER — Encounter: Payer: Medicare HMO | Admitting: Speech Pathology

## 2021-09-06 ENCOUNTER — Encounter: Payer: Self-pay | Admitting: Occupational Therapy

## 2021-09-06 ENCOUNTER — Other Ambulatory Visit: Payer: Self-pay

## 2021-09-06 ENCOUNTER — Ambulatory Visit: Payer: Medicare HMO | Attending: Physician Assistant | Admitting: Occupational Therapy

## 2021-09-06 DIAGNOSIS — I63511 Cerebral infarction due to unspecified occlusion or stenosis of right middle cerebral artery: Secondary | ICD-10-CM | POA: Insufficient documentation

## 2021-09-06 DIAGNOSIS — M6281 Muscle weakness (generalized): Secondary | ICD-10-CM | POA: Diagnosis not present

## 2021-09-06 DIAGNOSIS — R278 Other lack of coordination: Secondary | ICD-10-CM | POA: Insufficient documentation

## 2021-09-06 DIAGNOSIS — R482 Apraxia: Secondary | ICD-10-CM | POA: Diagnosis not present
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
## 2021-09-11 ENCOUNTER — Ambulatory Visit: Payer: Medicare HMO

## 2021-09-11 ENCOUNTER — Ambulatory Visit: Payer: Medicare HMO | Admitting: Speech Pathology

## 2021-09-11 ENCOUNTER — Other Ambulatory Visit: Payer: Self-pay

## 2021-09-11 DIAGNOSIS — R482 Apraxia: Secondary | ICD-10-CM | POA: Diagnosis not present

## 2021-09-11 DIAGNOSIS — R278 Other lack of coordination: Secondary | ICD-10-CM

## 2021-09-11 DIAGNOSIS — I63511 Cerebral infarction due to unspecified occlusion or stenosis of right middle cerebral artery: Secondary | ICD-10-CM | POA: Diagnosis not present

## 2021-09-11 DIAGNOSIS — M6281 Muscle weakness (generalized): Secondary | ICD-10-CM
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
## 2021-09-12 ENCOUNTER — Ambulatory Visit: Payer: Medicare HMO | Admitting: Occupational Therapy

## 2021-09-12 ENCOUNTER — Encounter: Payer: Self-pay | Admitting: Nurse Practitioner

## 2021-09-12 ENCOUNTER — Encounter: Payer: Self-pay | Admitting: Occupational Therapy

## 2021-09-12 ENCOUNTER — Ambulatory Visit (INDEPENDENT_AMBULATORY_CARE_PROVIDER_SITE_OTHER): Payer: Medicare HMO | Admitting: Nurse Practitioner

## 2021-09-12 VITALS — BP 140/70 | HR 82 | Temp 98.6°F | Resp 16 | Ht 70.0 in | Wt 174.0 lb

## 2021-09-12 DIAGNOSIS — R482 Apraxia: Secondary | ICD-10-CM | POA: Diagnosis not present

## 2021-09-12 DIAGNOSIS — I10 Essential (primary) hypertension: Secondary | ICD-10-CM

## 2021-09-12 DIAGNOSIS — I63511 Cerebral infarction due to unspecified occlusion or stenosis of right middle cerebral artery: Secondary | ICD-10-CM

## 2021-09-12 DIAGNOSIS — E782 Mixed hyperlipidemia: Secondary | ICD-10-CM | POA: Diagnosis not present

## 2021-09-12 DIAGNOSIS — R278 Other lack of coordination: Secondary | ICD-10-CM

## 2021-09-12 DIAGNOSIS — M6281 Muscle weakness (generalized): Secondary | ICD-10-CM

## 2021-09-12 DIAGNOSIS — L299 Pruritus, unspecified: Secondary | ICD-10-CM | POA: Diagnosis not present

## 2021-09-12 DIAGNOSIS — R7301 Impaired fasting glucose: Secondary | ICD-10-CM

## 2021-09-12 LAB — POCT GLYCOSYLATED HEMOGLOBIN (HGB A1C): Hemoglobin A1C: 5.4 % (ref 4.0–5.6)

## 2021-09-12 MED ORDER — TRIAMCINOLONE ACETONIDE 0.1 % EX CREA: 1.0000 "application " | TOPICAL_CREAM | Freq: Two times a day (BID) | CUTANEOUS | 2 refills | Status: DC

## 2021-09-12 NOTE — Therapy (Deleted)
[facility] MAIN [facility] SERVICES [address] [place], [place], [address] Phone: [phone]   Fax:  [phone]  Patient Details  Name: Kobyn Kray Deford MRN: [id number] Date of Birth: [birth year] Referring Provider:  Charlton Amor, PA-C  Encounter Date: 09/12/2021   Derrek Gu, OT 09/12/2021, 4:13 PM  [facility] MAIN [facility] SERVICES [address] [place], [place], [address] Phone: [phone]   Fax:  [phone]
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
## 2021-09-13 ENCOUNTER — Other Ambulatory Visit: Payer: Self-pay

## 2021-09-13 ENCOUNTER — Encounter: Payer: Medicare HMO | Admitting: Speech Pathology

## 2021-09-13 ENCOUNTER — Ambulatory Visit: Payer: Medicare HMO | Admitting: Nurse Practitioner

## 2021-09-13 ENCOUNTER — Ambulatory Visit: Payer: Medicare HMO

## 2021-09-13 DIAGNOSIS — M6281 Muscle weakness (generalized): Secondary | ICD-10-CM

## 2021-09-13 DIAGNOSIS — I63511 Cerebral infarction due to unspecified occlusion or stenosis of right middle cerebral artery: Secondary | ICD-10-CM | POA: Diagnosis not present

## 2021-09-13 DIAGNOSIS — R278 Other lack of coordination: Secondary | ICD-10-CM | POA: Diagnosis not present

## 2021-09-13 DIAGNOSIS — R482 Apraxia: Secondary | ICD-10-CM | POA: Diagnosis not present
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
## 2021-09-18 ENCOUNTER — Other Ambulatory Visit: Payer: Self-pay

## 2021-09-18 ENCOUNTER — Ambulatory Visit: Payer: Medicare HMO | Admitting: Speech Pathology

## 2021-09-18 ENCOUNTER — Ambulatory Visit: Payer: Medicare HMO

## 2021-09-18 ENCOUNTER — Ambulatory Visit: Payer: Medicare HMO | Admitting: Occupational Therapy

## 2021-09-18 DIAGNOSIS — R482 Apraxia: Secondary | ICD-10-CM | POA: Diagnosis not present

## 2021-09-18 DIAGNOSIS — R278 Other lack of coordination: Secondary | ICD-10-CM | POA: Diagnosis not present

## 2021-09-18 DIAGNOSIS — M6281 Muscle weakness (generalized): Secondary | ICD-10-CM | POA: Diagnosis not present

## 2021-09-18 DIAGNOSIS — I63511 Cerebral infarction due to unspecified occlusion or stenosis of right middle cerebral artery: Secondary | ICD-10-CM | POA: Diagnosis not present

## 2021-09-19 ENCOUNTER — Ambulatory Visit: Payer: Medicare HMO | Admitting: Occupational Therapy

## 2021-09-19 ENCOUNTER — Encounter: Payer: Self-pay | Admitting: Occupational Therapy

## 2021-09-19 DIAGNOSIS — M6281 Muscle weakness (generalized): Secondary | ICD-10-CM

## 2021-09-19 DIAGNOSIS — R482 Apraxia: Secondary | ICD-10-CM | POA: Diagnosis not present

## 2021-09-19 DIAGNOSIS — I63511 Cerebral infarction due to unspecified occlusion or stenosis of right middle cerebral artery: Secondary | ICD-10-CM | POA: Diagnosis not present

## 2021-09-19 DIAGNOSIS — R278 Other lack of coordination: Secondary | ICD-10-CM | POA: Diagnosis not present
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
## 2021-09-20 ENCOUNTER — Encounter: Payer: Self-pay | Admitting: Occupational Therapy

## 2021-09-20 ENCOUNTER — Ambulatory Visit: Payer: Medicare HMO

## 2021-09-20 ENCOUNTER — Other Ambulatory Visit: Payer: Self-pay | Admitting: Nurse Practitioner

## 2021-09-20 ENCOUNTER — Ambulatory Visit: Payer: Medicare HMO | Admitting: Occupational Therapy

## 2021-09-20 ENCOUNTER — Other Ambulatory Visit: Payer: Self-pay

## 2021-09-20 ENCOUNTER — Encounter: Payer: Medicare HMO | Admitting: Speech Pathology

## 2021-09-20 DIAGNOSIS — R278 Other lack of coordination: Secondary | ICD-10-CM | POA: Diagnosis not present

## 2021-09-20 DIAGNOSIS — M6281 Muscle weakness (generalized): Secondary | ICD-10-CM

## 2021-09-20 DIAGNOSIS — R482 Apraxia: Secondary | ICD-10-CM | POA: Diagnosis not present

## 2021-09-20 DIAGNOSIS — I63511 Cerebral infarction due to unspecified occlusion or stenosis of right middle cerebral artery: Secondary | ICD-10-CM | POA: Diagnosis not present
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
## 2021-09-25 ENCOUNTER — Ambulatory Visit: Payer: Medicare HMO | Admitting: Speech Pathology

## 2021-09-25 ENCOUNTER — Ambulatory Visit: Payer: Medicare HMO

## 2021-09-25 ENCOUNTER — Other Ambulatory Visit: Payer: Self-pay

## 2021-09-25 DIAGNOSIS — I63511 Cerebral infarction due to unspecified occlusion or stenosis of right middle cerebral artery: Secondary | ICD-10-CM | POA: Diagnosis not present

## 2021-09-25 DIAGNOSIS — R278 Other lack of coordination: Secondary | ICD-10-CM

## 2021-09-25 DIAGNOSIS — M6281 Muscle weakness (generalized): Secondary | ICD-10-CM | POA: Diagnosis not present

## 2021-09-25 DIAGNOSIS — R482 Apraxia: Secondary | ICD-10-CM | POA: Diagnosis not present
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
## 2021-09-26 ENCOUNTER — Ambulatory Visit: Payer: Medicare HMO | Admitting: Occupational Therapy

## 2021-09-26 DIAGNOSIS — M6281 Muscle weakness (generalized): Secondary | ICD-10-CM | POA: Diagnosis not present

## 2021-09-26 DIAGNOSIS — I63511 Cerebral infarction due to unspecified occlusion or stenosis of right middle cerebral artery: Secondary | ICD-10-CM | POA: Diagnosis not present

## 2021-09-26 DIAGNOSIS — R278 Other lack of coordination: Secondary | ICD-10-CM

## 2021-09-26 DIAGNOSIS — R482 Apraxia: Secondary | ICD-10-CM | POA: Diagnosis not present

## 2021-09-27 ENCOUNTER — Encounter: Payer: Self-pay | Admitting: Occupational Therapy

## 2021-09-27 ENCOUNTER — Ambulatory Visit: Payer: Medicare HMO

## 2021-09-27 ENCOUNTER — Encounter: Payer: Medicare HMO | Admitting: Speech Pathology

## 2021-09-27 ENCOUNTER — Other Ambulatory Visit: Payer: Self-pay

## 2021-09-27 ENCOUNTER — Ambulatory Visit: Payer: Medicare HMO | Admitting: Occupational Therapy

## 2021-09-27 DIAGNOSIS — R278 Other lack of coordination: Secondary | ICD-10-CM

## 2021-09-27 DIAGNOSIS — M6281 Muscle weakness (generalized): Secondary | ICD-10-CM

## 2021-09-27 DIAGNOSIS — I63511 Cerebral infarction due to unspecified occlusion or stenosis of right middle cerebral artery: Secondary | ICD-10-CM | POA: Diagnosis not present

## 2021-09-27 DIAGNOSIS — R482 Apraxia: Secondary | ICD-10-CM | POA: Diagnosis not present
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
## 2021-10-02 ENCOUNTER — Other Ambulatory Visit: Payer: Self-pay

## 2021-10-02 ENCOUNTER — Ambulatory Visit: Payer: Medicare HMO

## 2021-10-02 ENCOUNTER — Ambulatory Visit: Payer: Medicare HMO | Admitting: Speech Pathology

## 2021-10-02 DIAGNOSIS — I63511 Cerebral infarction due to unspecified occlusion or stenosis of right middle cerebral artery: Secondary | ICD-10-CM | POA: Diagnosis not present

## 2021-10-02 DIAGNOSIS — M6281 Muscle weakness (generalized): Secondary | ICD-10-CM | POA: Diagnosis not present

## 2021-10-02 DIAGNOSIS — R482 Apraxia: Secondary | ICD-10-CM | POA: Diagnosis not present

## 2021-10-02 DIAGNOSIS — R278 Other lack of coordination: Secondary | ICD-10-CM | POA: Diagnosis not present
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
## 2021-10-03 ENCOUNTER — Ambulatory Visit: Payer: Medicare HMO | Admitting: Occupational Therapy

## 2021-10-03 ENCOUNTER — Encounter: Payer: Self-pay | Admitting: Occupational Therapy

## 2021-10-03 DIAGNOSIS — M6281 Muscle weakness (generalized): Secondary | ICD-10-CM

## 2021-10-03 DIAGNOSIS — R482 Apraxia: Secondary | ICD-10-CM | POA: Diagnosis not present

## 2021-10-03 DIAGNOSIS — I63511 Cerebral infarction due to unspecified occlusion or stenosis of right middle cerebral artery: Secondary | ICD-10-CM | POA: Diagnosis not present

## 2021-10-03 DIAGNOSIS — R278 Other lack of coordination: Secondary | ICD-10-CM | POA: Diagnosis not present
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
## 2021-10-04 ENCOUNTER — Encounter: Payer: Medicare HMO | Admitting: Speech Pathology

## 2021-10-04 ENCOUNTER — Ambulatory Visit: Payer: Medicare HMO | Admitting: Occupational Therapy

## 2021-10-04 ENCOUNTER — Other Ambulatory Visit: Payer: Self-pay

## 2021-10-04 ENCOUNTER — Ambulatory Visit: Payer: Medicare HMO

## 2021-10-04 DIAGNOSIS — R482 Apraxia: Secondary | ICD-10-CM | POA: Diagnosis not present

## 2021-10-04 DIAGNOSIS — R278 Other lack of coordination: Secondary | ICD-10-CM | POA: Diagnosis not present

## 2021-10-04 DIAGNOSIS — M6281 Muscle weakness (generalized): Secondary | ICD-10-CM | POA: Diagnosis not present

## 2021-10-04 DIAGNOSIS — I63511 Cerebral infarction due to unspecified occlusion or stenosis of right middle cerebral artery: Secondary | ICD-10-CM | POA: Diagnosis not present
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
## 2021-10-09 ENCOUNTER — Ambulatory Visit: Payer: Medicare HMO | Admitting: Speech Pathology

## 2021-10-09 ENCOUNTER — Ambulatory Visit: Payer: Medicare HMO

## 2021-10-09 ENCOUNTER — Encounter: Payer: Self-pay | Admitting: Occupational Therapy

## 2021-10-09 ENCOUNTER — Ambulatory Visit: Payer: Medicare HMO | Attending: Physician Assistant | Admitting: Occupational Therapy

## 2021-10-09 ENCOUNTER — Other Ambulatory Visit: Payer: Self-pay

## 2021-10-09 DIAGNOSIS — I63511 Cerebral infarction due to unspecified occlusion or stenosis of right middle cerebral artery: Secondary | ICD-10-CM | POA: Diagnosis not present

## 2021-10-09 DIAGNOSIS — R278 Other lack of coordination: Secondary | ICD-10-CM | POA: Insufficient documentation

## 2021-10-09 DIAGNOSIS — M6281 Muscle weakness (generalized): Secondary | ICD-10-CM | POA: Diagnosis not present
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
## 2021-10-10 ENCOUNTER — Ambulatory Visit: Payer: Medicare HMO | Admitting: Occupational Therapy

## 2021-10-10 ENCOUNTER — Encounter: Payer: Self-pay | Admitting: Occupational Therapy

## 2021-10-10 DIAGNOSIS — I63511 Cerebral infarction due to unspecified occlusion or stenosis of right middle cerebral artery: Secondary | ICD-10-CM | POA: Diagnosis not present

## 2021-10-10 DIAGNOSIS — R278 Other lack of coordination: Secondary | ICD-10-CM | POA: Diagnosis not present

## 2021-10-10 DIAGNOSIS — M6281 Muscle weakness (generalized): Secondary | ICD-10-CM
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
## 2021-10-11 ENCOUNTER — Encounter: Payer: Medicare HMO | Admitting: Speech Pathology

## 2021-10-11 ENCOUNTER — Ambulatory Visit: Payer: Medicare HMO

## 2021-10-11 ENCOUNTER — Other Ambulatory Visit: Payer: Self-pay

## 2021-10-11 ENCOUNTER — Ambulatory Visit: Payer: Medicare HMO | Admitting: Occupational Therapy

## 2021-10-12 ENCOUNTER — Encounter: Payer: Self-pay | Admitting: Physical Medicine & Rehabilitation

## 2021-10-12 ENCOUNTER — Encounter: Payer: Medicare HMO | Attending: Physical Medicine & Rehabilitation | Admitting: Physical Medicine & Rehabilitation

## 2021-10-12 ENCOUNTER — Other Ambulatory Visit: Payer: Self-pay

## 2021-10-12 VITALS — BP 160/77 | HR 72 | Temp 97.4°F | Ht 70.0 in | Wt 176.2 lb

## 2021-10-12 DIAGNOSIS — M7542 Impingement syndrome of left shoulder: Secondary | ICD-10-CM | POA: Diagnosis not present

## 2021-10-12 DIAGNOSIS — I69354 Hemiplegia and hemiparesis following cerebral infarction affecting left non-dominant side: Secondary | ICD-10-CM | POA: Diagnosis not present
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
## 2021-10-12 NOTE — Patient Instructions (Signed)
Consider Botox for left finger spasticity  Make appt with ophthalmologist  Do not exceed 2 alcoholic beverages per day

## 2021-10-16 ENCOUNTER — Ambulatory Visit: Payer: Medicare HMO | Admitting: Speech Pathology

## 2021-10-16 ENCOUNTER — Ambulatory Visit: Payer: Medicare HMO

## 2021-10-16 ENCOUNTER — Encounter: Payer: Self-pay | Admitting: Occupational Therapy

## 2021-10-16 ENCOUNTER — Other Ambulatory Visit: Payer: Self-pay

## 2021-10-16 ENCOUNTER — Ambulatory Visit: Payer: Medicare HMO | Admitting: Occupational Therapy

## 2021-10-16 DIAGNOSIS — M6281 Muscle weakness (generalized): Secondary | ICD-10-CM | POA: Diagnosis not present

## 2021-10-16 DIAGNOSIS — I63511 Cerebral infarction due to unspecified occlusion or stenosis of right middle cerebral artery: Secondary | ICD-10-CM | POA: Diagnosis not present

## 2021-10-16 DIAGNOSIS — R278 Other lack of coordination: Secondary | ICD-10-CM | POA: Diagnosis not present
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
## 2021-10-17 ENCOUNTER — Encounter: Payer: Self-pay | Admitting: Occupational Therapy

## 2021-10-17 ENCOUNTER — Ambulatory Visit: Payer: Medicare HMO | Admitting: Occupational Therapy

## 2021-10-17 DIAGNOSIS — R278 Other lack of coordination: Secondary | ICD-10-CM | POA: Diagnosis not present

## 2021-10-17 DIAGNOSIS — M6281 Muscle weakness (generalized): Secondary | ICD-10-CM | POA: Diagnosis not present

## 2021-10-17 DIAGNOSIS — I63511 Cerebral infarction due to unspecified occlusion or stenosis of right middle cerebral artery: Secondary | ICD-10-CM | POA: Diagnosis not present
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
## 2021-10-18 ENCOUNTER — Other Ambulatory Visit: Payer: Self-pay

## 2021-10-18 ENCOUNTER — Ambulatory Visit: Payer: Medicare HMO | Admitting: Occupational Therapy

## 2021-10-18 ENCOUNTER — Encounter: Payer: Medicare HMO | Admitting: Speech Pathology

## 2021-10-18 ENCOUNTER — Ambulatory Visit: Payer: Medicare HMO

## 2021-10-18 DIAGNOSIS — I63511 Cerebral infarction due to unspecified occlusion or stenosis of right middle cerebral artery: Secondary | ICD-10-CM | POA: Diagnosis not present

## 2021-10-18 DIAGNOSIS — M6281 Muscle weakness (generalized): Secondary | ICD-10-CM | POA: Diagnosis not present

## 2021-10-18 DIAGNOSIS — R278 Other lack of coordination: Secondary | ICD-10-CM | POA: Diagnosis not present
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
## 2021-10-19 ENCOUNTER — Other Ambulatory Visit: Payer: Self-pay | Admitting: Nurse Practitioner

## 2021-10-23 ENCOUNTER — Ambulatory Visit: Payer: Medicare HMO | Admitting: Speech Pathology

## 2021-10-23 ENCOUNTER — Ambulatory Visit: Payer: Medicare HMO

## 2021-10-23 ENCOUNTER — Encounter: Payer: Self-pay | Admitting: Occupational Therapy

## 2021-10-23 ENCOUNTER — Ambulatory Visit: Payer: Medicare HMO | Admitting: Occupational Therapy

## 2021-10-23 ENCOUNTER — Other Ambulatory Visit: Payer: Self-pay

## 2021-10-23 DIAGNOSIS — I63511 Cerebral infarction due to unspecified occlusion or stenosis of right middle cerebral artery: Secondary | ICD-10-CM | POA: Diagnosis not present

## 2021-10-23 DIAGNOSIS — R278 Other lack of coordination: Secondary | ICD-10-CM | POA: Diagnosis not present

## 2021-10-23 DIAGNOSIS — M6281 Muscle weakness (generalized): Secondary | ICD-10-CM | POA: Diagnosis not present
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
## 2021-10-24 ENCOUNTER — Ambulatory Visit: Payer: Medicare HMO | Admitting: Occupational Therapy

## 2021-10-24 ENCOUNTER — Encounter: Payer: Self-pay | Admitting: Occupational Therapy

## 2021-10-24 DIAGNOSIS — I63511 Cerebral infarction due to unspecified occlusion or stenosis of right middle cerebral artery: Secondary | ICD-10-CM | POA: Diagnosis not present

## 2021-10-24 DIAGNOSIS — M6281 Muscle weakness (generalized): Secondary | ICD-10-CM | POA: Diagnosis not present

## 2021-10-24 DIAGNOSIS — R278 Other lack of coordination: Secondary | ICD-10-CM | POA: Diagnosis not present
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
## 2021-10-25 ENCOUNTER — Encounter: Payer: Medicare HMO | Admitting: Speech Pathology

## 2021-10-25 ENCOUNTER — Other Ambulatory Visit: Payer: Self-pay

## 2021-10-25 ENCOUNTER — Ambulatory Visit: Payer: Medicare HMO | Admitting: Occupational Therapy

## 2021-10-25 ENCOUNTER — Ambulatory Visit: Payer: Medicare HMO

## 2021-10-25 DIAGNOSIS — M6281 Muscle weakness (generalized): Secondary | ICD-10-CM

## 2021-10-25 DIAGNOSIS — R278 Other lack of coordination: Secondary | ICD-10-CM

## 2021-10-25 DIAGNOSIS — I63511 Cerebral infarction due to unspecified occlusion or stenosis of right middle cerebral artery: Secondary | ICD-10-CM | POA: Diagnosis not present
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
## 2021-10-30 ENCOUNTER — Ambulatory Visit: Payer: Medicare HMO

## 2021-10-30 ENCOUNTER — Other Ambulatory Visit: Payer: Self-pay

## 2021-10-30 ENCOUNTER — Ambulatory Visit: Payer: Medicare HMO | Admitting: Speech Pathology

## 2021-10-30 ENCOUNTER — Ambulatory Visit: Payer: Medicare HMO | Admitting: Occupational Therapy

## 2021-10-30 ENCOUNTER — Encounter: Payer: Self-pay | Admitting: Occupational Therapy

## 2021-10-30 DIAGNOSIS — R278 Other lack of coordination: Secondary | ICD-10-CM | POA: Diagnosis not present

## 2021-10-30 DIAGNOSIS — M6281 Muscle weakness (generalized): Secondary | ICD-10-CM

## 2021-10-30 DIAGNOSIS — I63511 Cerebral infarction due to unspecified occlusion or stenosis of right middle cerebral artery: Secondary | ICD-10-CM | POA: Diagnosis not present
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
## 2021-10-31 ENCOUNTER — Ambulatory Visit: Payer: Medicare HMO | Admitting: Occupational Therapy

## 2021-10-31 ENCOUNTER — Encounter: Payer: Self-pay | Admitting: Occupational Therapy

## 2021-10-31 DIAGNOSIS — R278 Other lack of coordination: Secondary | ICD-10-CM

## 2021-10-31 DIAGNOSIS — M6281 Muscle weakness (generalized): Secondary | ICD-10-CM

## 2021-10-31 DIAGNOSIS — I63511 Cerebral infarction due to unspecified occlusion or stenosis of right middle cerebral artery: Secondary | ICD-10-CM | POA: Diagnosis not present
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
## 2021-11-01 ENCOUNTER — Encounter: Payer: Medicare HMO | Admitting: Speech Pathology

## 2021-11-01 ENCOUNTER — Ambulatory Visit: Payer: Medicare HMO | Admitting: Occupational Therapy

## 2021-11-01 ENCOUNTER — Ambulatory Visit: Payer: Medicare HMO

## 2021-11-01 ENCOUNTER — Encounter: Payer: Self-pay | Admitting: Occupational Therapy

## 2021-11-01 ENCOUNTER — Other Ambulatory Visit: Payer: Self-pay

## 2021-11-01 DIAGNOSIS — I63511 Cerebral infarction due to unspecified occlusion or stenosis of right middle cerebral artery: Secondary | ICD-10-CM | POA: Diagnosis not present

## 2021-11-01 DIAGNOSIS — M6281 Muscle weakness (generalized): Secondary | ICD-10-CM

## 2021-11-01 DIAGNOSIS — R278 Other lack of coordination: Secondary | ICD-10-CM
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
## 2021-11-06 ENCOUNTER — Ambulatory Visit: Payer: Medicare HMO

## 2021-11-06 ENCOUNTER — Other Ambulatory Visit: Payer: Self-pay

## 2021-11-06 ENCOUNTER — Ambulatory Visit: Payer: Medicare HMO | Admitting: Speech Pathology

## 2021-11-06 ENCOUNTER — Ambulatory Visit: Payer: Medicare HMO | Admitting: Occupational Therapy

## 2021-11-06 DIAGNOSIS — M6281 Muscle weakness (generalized): Secondary | ICD-10-CM | POA: Diagnosis not present

## 2021-11-06 DIAGNOSIS — I63511 Cerebral infarction due to unspecified occlusion or stenosis of right middle cerebral artery: Secondary | ICD-10-CM | POA: Diagnosis not present

## 2021-11-06 DIAGNOSIS — R278 Other lack of coordination: Secondary | ICD-10-CM | POA: Diagnosis not present
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
## 2021-11-07 ENCOUNTER — Encounter: Payer: Self-pay | Admitting: Occupational Therapy

## 2021-11-07 ENCOUNTER — Ambulatory Visit: Payer: Medicare HMO | Attending: Physician Assistant | Admitting: Occupational Therapy

## 2021-11-07 DIAGNOSIS — R278 Other lack of coordination: Secondary | ICD-10-CM | POA: Insufficient documentation

## 2021-11-07 DIAGNOSIS — M6281 Muscle weakness (generalized): Secondary | ICD-10-CM | POA: Insufficient documentation
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
## 2021-11-08 ENCOUNTER — Encounter: Payer: Self-pay | Admitting: Occupational Therapy

## 2021-11-08 ENCOUNTER — Ambulatory Visit: Payer: Medicare HMO | Admitting: Occupational Therapy

## 2021-11-08 ENCOUNTER — Encounter: Payer: Medicare HMO | Admitting: Speech Pathology

## 2021-11-08 ENCOUNTER — Ambulatory Visit: Payer: Medicare HMO

## 2021-11-08 ENCOUNTER — Other Ambulatory Visit: Payer: Self-pay

## 2021-11-08 DIAGNOSIS — M6281 Muscle weakness (generalized): Secondary | ICD-10-CM | POA: Diagnosis not present

## 2021-11-08 DIAGNOSIS — R278 Other lack of coordination: Secondary | ICD-10-CM | POA: Diagnosis not present
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
## 2021-11-09 ENCOUNTER — Telehealth: Payer: Self-pay | Admitting: Nurse Practitioner

## 2021-11-09 NOTE — Progress Notes (Signed)
°  Chronic Care Management   Outreach Note  11/09/2021 Name: Leonard [name] MRN: [id number] DOB: [birth year]  Referred by: Sallyanne Kuster, NP Reason for referral : No chief complaint on file.   An unsuccessful telephone outreach was attempted today. The patient was referred to the pharmacist for assistance with care management and care coordination.   Follow Up Plan:   Tatjana Dellinger Upstream Scheduler

## 2021-11-13 ENCOUNTER — Encounter: Payer: Self-pay | Admitting: Occupational Therapy

## 2021-11-13 ENCOUNTER — Ambulatory Visit: Payer: Medicare HMO | Admitting: Speech Pathology

## 2021-11-13 ENCOUNTER — Ambulatory Visit: Payer: Medicare HMO

## 2021-11-13 ENCOUNTER — Other Ambulatory Visit: Payer: Self-pay

## 2021-11-13 ENCOUNTER — Ambulatory Visit: Payer: Medicare HMO | Admitting: Occupational Therapy

## 2021-11-13 DIAGNOSIS — M6281 Muscle weakness (generalized): Secondary | ICD-10-CM | POA: Diagnosis not present

## 2021-11-13 DIAGNOSIS — R278 Other lack of coordination: Secondary | ICD-10-CM | POA: Diagnosis not present
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
## 2021-11-14 ENCOUNTER — Ambulatory Visit: Payer: Medicare HMO | Admitting: Occupational Therapy

## 2021-11-14 ENCOUNTER — Encounter: Payer: Self-pay | Admitting: Occupational Therapy

## 2021-11-14 DIAGNOSIS — R278 Other lack of coordination: Secondary | ICD-10-CM

## 2021-11-14 DIAGNOSIS — M6281 Muscle weakness (generalized): Secondary | ICD-10-CM | POA: Diagnosis not present
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
## 2021-11-15 ENCOUNTER — Ambulatory Visit: Payer: Medicare HMO | Admitting: Occupational Therapy

## 2021-11-15 ENCOUNTER — Encounter: Payer: Self-pay | Admitting: Occupational Therapy

## 2021-11-15 ENCOUNTER — Encounter: Payer: Medicare HMO | Admitting: Speech Pathology

## 2021-11-15 ENCOUNTER — Other Ambulatory Visit: Payer: Self-pay

## 2021-11-15 ENCOUNTER — Ambulatory Visit: Payer: Medicare HMO

## 2021-11-15 DIAGNOSIS — M6281 Muscle weakness (generalized): Secondary | ICD-10-CM

## 2021-11-15 DIAGNOSIS — R278 Other lack of coordination: Secondary | ICD-10-CM | POA: Diagnosis not present
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
## 2021-11-16 ENCOUNTER — Telehealth: Payer: Self-pay | Admitting: Nurse Practitioner

## 2021-11-16 NOTE — Chronic Care Management (AMB) (Signed)
°  Chronic Care Management   Outreach Note  11/16/2021 Name: Leonard [name] MRN: [id number] DOB: [birth year]  Referred by: Sallyanne Kuster, NP Reason for referral : No chief complaint on file.   A second unsuccessful telephone outreach was attempted today. The patient was referred to pharmacist for assistance with care management and care coordination.  Follow Up Plan:   Tatjana Dellinger [employer] Scheduler

## 2021-11-19 ENCOUNTER — Telehealth: Payer: Self-pay | Admitting: Nurse Practitioner

## 2021-11-19 NOTE — Chronic Care Management (AMB) (Signed)
°  Chronic Care Management   Note  11/19/2021 Name: Leonard [name] MRN: [id number] DOB: 16-Jan-1956  Leonard [name] is a 66 y.o. year old male who is a primary care patient of Sallyanne Kuster, NP. I reached out to Leonard Friends Lagerstrom by phone today in response to a referral sent by Mr. Leonard Friends Hengel's PCP, Sallyanne Kuster, NP.   [name] was given information about Chronic Care Management services today including:  CCM service includes personalized support from designated clinical staff supervised by his physician, including individualized plan of care and coordination with other care providers 24/7 contact phone numbers for assistance for urgent and routine care needs. Service will only be billed when office clinical staff spend 20 minutes or more in a month to coordinate care. Only one practitioner Hreha furnish and bill the service in a calendar month. [name] stop CCM services at any time (effective at the end of the month) by phone call to the office staff.   KAREN Bertagnolli/SPOUSE verbally agreed to assistance and services provided by embedded care coordination/care management team today.  Follow up plan:   Tatjana Restaurant manager, fast food

## 2021-11-20 ENCOUNTER — Ambulatory Visit: Payer: Medicare HMO | Admitting: Occupational Therapy

## 2021-11-20 ENCOUNTER — Ambulatory Visit: Payer: Medicare HMO

## 2021-11-20 ENCOUNTER — Encounter: Payer: Self-pay | Admitting: Occupational Therapy

## 2021-11-20 ENCOUNTER — Other Ambulatory Visit: Payer: Self-pay

## 2021-11-20 ENCOUNTER — Other Ambulatory Visit (HOSPITAL_COMMUNITY): Payer: Self-pay | Admitting: Interventional Radiology

## 2021-11-20 DIAGNOSIS — M6281 Muscle weakness (generalized): Secondary | ICD-10-CM

## 2021-11-20 DIAGNOSIS — R278 Other lack of coordination: Secondary | ICD-10-CM | POA: Diagnosis not present

## 2021-11-20 DIAGNOSIS — I639 Cerebral infarction, unspecified: Secondary | ICD-10-CM
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
## 2021-11-21 ENCOUNTER — Ambulatory Visit: Payer: Medicare HMO | Admitting: Occupational Therapy

## 2021-11-21 ENCOUNTER — Encounter: Payer: Self-pay | Admitting: Occupational Therapy

## 2021-11-21 DIAGNOSIS — M6281 Muscle weakness (generalized): Secondary | ICD-10-CM

## 2021-11-21 DIAGNOSIS — R278 Other lack of coordination: Secondary | ICD-10-CM | POA: Diagnosis not present
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
## 2021-11-22 ENCOUNTER — Ambulatory Visit: Payer: Medicare HMO

## 2021-11-22 ENCOUNTER — Encounter: Payer: Self-pay | Admitting: Occupational Therapy

## 2021-11-22 ENCOUNTER — Other Ambulatory Visit: Payer: Self-pay

## 2021-11-22 ENCOUNTER — Ambulatory Visit: Payer: Medicare HMO | Admitting: Occupational Therapy

## 2021-11-22 DIAGNOSIS — M6281 Muscle weakness (generalized): Secondary | ICD-10-CM | POA: Diagnosis not present

## 2021-11-22 DIAGNOSIS — R278 Other lack of coordination: Secondary | ICD-10-CM | POA: Diagnosis not present
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
## 2021-11-27 ENCOUNTER — Other Ambulatory Visit: Payer: Self-pay

## 2021-11-27 ENCOUNTER — Ambulatory Visit: Payer: Medicare HMO | Admitting: Adult Health

## 2021-11-27 ENCOUNTER — Encounter: Payer: Self-pay | Admitting: Adult Health

## 2021-11-27 ENCOUNTER — Ambulatory Visit: Payer: Medicare HMO | Admitting: Neurology

## 2021-11-27 ENCOUNTER — Ambulatory Visit: Payer: Medicare HMO

## 2021-11-27 ENCOUNTER — Ambulatory Visit: Payer: Medicare HMO | Admitting: Occupational Therapy

## 2021-11-27 VITALS — BP 155/81 | HR 74 | Ht 70.0 in | Wt 178.0 lb

## 2021-11-27 DIAGNOSIS — I63411 Cerebral infarction due to embolism of right middle cerebral artery: Secondary | ICD-10-CM | POA: Diagnosis not present

## 2021-11-27 DIAGNOSIS — I6521 Occlusion and stenosis of right carotid artery: Secondary | ICD-10-CM

## 2021-11-27 DIAGNOSIS — R278 Other lack of coordination: Secondary | ICD-10-CM | POA: Diagnosis not present

## 2021-11-27 DIAGNOSIS — M6281 Muscle weakness (generalized): Secondary | ICD-10-CM | POA: Diagnosis not present

## 2021-11-27 NOTE — Patient Instructions (Addendum)
Continue working with occupational therapy - keep up the great work!!  Continue to follow with Dr. Wynn Banker as scheduled  Recommend proceeding with imaging to monitor your stent - they are looking for re-stenosis in that stent - if this were to happen, you have a increased risk of additional strokes  Please ensure you follow up with your PCP regarding  persistent cold sensation   Continue aspirin 81 mg daily  and lovastatin  for secondary stroke prevention Would recommend avoidance of grapefruit or grapefruit juice lovastatin  Continue to follow up with PCP regarding cholesterol and blood pressure management  Maintain strict control of hypertension with blood pressure goal below 130/90, diabetes with hemoglobin A1c goal below 7.0 % and cholesterol with LDL cholesterol (bad cholesterol) goal below 70 mg/dL.   Signs of a Stroke? Follow the BEFAST method:  Balance Watch for a sudden loss of balance, trouble with coordination or vertigo Eyes Is there a sudden loss of vision in one or both eyes? Or double vision?  Face: Ask the person to smile. Does one side of the face droop or is it numb?  Arms: Ask the person to raise both arms. Does one arm drift downward? Is there weakness or numbness of a leg? Speech: Ask the person to repeat a simple phrase. Does the speech sound slurred/strange? Is the person confused ? Time: If you observe any of these signs, call [phone].         Thank you for coming to see [place] at [facility]. I hope we have been able to provide you high quality care today.  You [place] receive a patient satisfaction survey over the next few weeks. We would appreciate your feedback and comments so that we [place] continue to improve ourselves and the health of our patients.

## 2021-11-27 NOTE — Progress Notes (Signed)
[facility] [address] [place]. [place] [address] [phone]       OFFICE FOLLOW-UP NOTE  Mr. Rian Busche Pompei Date of Birth:  1956/06/07 Medical Record Number:  [id number]    Primary neurologist: Dr. Pearlean Brownie Reason for visit: Stroke follow-up  Chief Complaint  Patient presents with   Follow-up    RM 3 accompanied by his wife Pt is stable from stroke standpoint, c/o itching and a sweet tooth, constant cold feeling      HPI:   Update 11/27/2021 JM: 66 year old male who returns for stroke follow-up accompanied by his wife.  Overall doing well without new stroke/TIA symptoms.  Continues to work with OT for residual left arm and hand weakness with continued improvement, received left shoulder injection for subacromial impingement by Dr. Wynn Banker last month reporting some improvement.  Has f/u visit 4/14.  Per wife, reports generalized body itching, persistent sweet tooth and persistent generalized cold feeling - none are new concerns and have been present since his stroke. Per epic review, has been seeing PCP for itching complaints. Compliant on aspirin and lovastatin without side effects. He completed Brilinta back in October.  Blood pressure today 155/81.  Reports routine follow-up with PCP.  No new concerns at this time.    History provided for reference purposes only Initial visit 05/30/2021 Dr. Pearlean Brownie: Mr. Brodersen is a 66 year old pleasant Caucasian male seen today for initial office follow-up visit following hospital admission for stroke in April 2022.  He is accompanied by his wife.  History is obtained from them and review of electronic medical records and personally reviewed pertinent imaging films in PACS.  Patient has no significant past medical history and presented on 01/24/2021 with sudden onset of left hemiplegia and left facial droop, left hemineglect and dysarthria.  He was last seen to be normal by family at 6:30 PM when he went to go to sit on a bench [place] a [place]  [place] property.  When family returned to check on him at 9:30 PM he was slumped over to his left side and hemiplegic with dysarthria.  EMS was called and code stroke was called in route.  His blood pressure on arrival was elevated at 232/124.  CT scan of the head showed no hemorrhage but subtle hypodensity in portions of the right MCA territory as well as dense right MCA sign.  Aspect score was 7.  CT angiogram showed complete occlusion of the right internal carotid artery at its origin with complete occlusion of the right middle cerebral artery at its origin with no collateral flow.  There was 68 mL of right middle cerebral artery core infarct with 133 mils of ischemic penumbra on the perfusion scan.  Patient was given IV tPA and went for emergent mechanical thrombectomy which was successfully performed by Dr. Corliss Skains with TICI 3 revascularization but requiring rescue right carotid stenting.  Patient was kept in the ICU and subsequently extubated and started on aspirin and Brilinta.  Echocardiogram showed normal ejection fraction of 60 to 65% with mild left ventricular hypertrophy.  LDL cholesterol is 126 mg percent and hemoglobin A1c was 5.8.  Urine drug screen was negative.  Patient tested positive for COVID during this hospitalization and he had previously refused COVID vaccination.  Chest x-ray showed multifocal pneumonia and he was treated with remdesivir, Actemra and steroids as per critical care team and recovered gradually.  He went to inpatient rehab and was subsequently discharged home.  He is currently living at home and  doing well.  He still has significant weakness in his left grip and hand muscles as well as significant shoulder pain which limits his ability to work but still he is pretty independent at home and [place] and [place] steps without assistance.  He is still on aspirin and Brilinta and tolerating it well without bruising or bleeding.  He did have a follow-up CT angiogram  done last month which shows patent carotid stent without significant restenosis.  He states his blood pressure is well controlled and today it is 138/74.  He is tolerating lovastatin well without muscle aches and pains.  He has been scheduled for follow-up carotid ultrasound next week by his primary care physician.     ROS:   14 system review of systems is positive for those listed in HPI and all other  systems negative  PMH:  Past Medical History:  Diagnosis Date   Stroke [facility](HCC)    april 2022, left hand weak, left foot    Social History:  Social History   Socioeconomic History   Marital status: Married    Spouse name: Clydie BraunKaren Gill   Number of children: Not on file   Years of education: Not on file   Highest education level: Not on file  Occupational History   Not on file  Tobacco Use   Smoking status: Never    Passive exposure: Never   Smokeless tobacco: Never  Vaping Use   Vaping Use: Never used  Substance and Sexual Activity   Alcohol use: Yes    Alcohol/week: 10.0 standard drinks    Types: 10 Cans of beer per week    Comment: weekly   Drug use: Never   Sexual activity: Never  Other Topics Concern   Not on file  Social History Narrative   Not on file   Social Determinants of Health   Financial Resource Strain: Not on file  Food Insecurity: Not on file  Transportation Needs: Not on file  Physical Activity: Not on file  Stress: Not on file  Social Connections: Not on file  Intimate Partner Violence: Not on file    Medications:   Current Outpatient Medications on File Prior to Visit  Medication Sig Dispense Refill   acetaminophen (TYLENOL) 325 MG tablet Take 2 tablets (650 mg total) by mouth every 4 (four) hours as needed for mild pain (or temp > 37.5 C (99.5 F)).     amLODipine (NORVASC) 5 MG tablet TAKE 1 TABLET (5 MG TOTAL) BY MOUTH DAILY. 90 tablet 1   aspirin EC 81 MG tablet Take 81 mg by mouth daily. Swallow whole.     [employer] VITAMIN C 500 MG tablet TAKE 1  TABLET (500 MG TOTAL) BY MOUTH DAILY. 90 tablet 1   lovastatin (MEVACOR) 10 MG tablet Take 1 tablet (10 mg total) by mouth at bedtime. 90 tablet 1   pantoprazole (PROTONIX) 40 MG tablet TAKE 1 TABLET BY MOUTH EVERY DAY 90 tablet 1   triamcinolone cream (KENALOG) 0.1 % Apply 1 application topically 2 (two) times daily. 45 g 2   No current facility-administered medications on file prior to visit.    Allergies:  No Known Allergies  Physical Exam Today's Vitals   11/27/21 1523  BP: (!) 155/81  Pulse: 74  Weight: 178 lb (80.7 kg)  Height: 5\' 10"  (1.778 m)   Body mass index is 25.54 kg/m.   General: well developed, well nourished middle-aged Caucasian male, seated, in no evident distress Head:  head normocephalic and atraumatic.  Neck: supple with no carotid or supraclavicular bruits Cardiovascular: regular rate and rhythm, no murmurs Musculoskeletal: no deformity Skin:  no rash/petichiae Vascular:  Normal pulses all extremities  Neurologic Exam Mental Status: Awake and fully alert.  Fluent speech and language.  Oriented to place and time. Recent and remote memory intact. Attention span, concentration and fund of knowledge appropriate. Mood and affect appropriate.  Cranial Nerves: Pupils equal, briskly reactive to light. Extraocular movements full without nystagmus. Visual fields full to confrontation. Hearing intact. Facial sensation intact.  Moderate left lower facial weakness., tongue, palate moves normally and symmetrically.  Motor: Normal strength on the right side and LLE.  LUE: 3/5 deltoid, 4/5 elbow extension and flexion, 2/5 hand grip with increased swelling at hand.  Sensory.: intact to touch ,pinprick .position and vibratory sensation except mild subjective diminished touch and pinprick sensation left upper extremity only Coordination: Rapid alternating movements normal in all extremities except left hand. Finger-to-nose performed accurately RUE and heel-to-shin performed  accurately bilaterally. Gait and Station: Arises from chair without difficulty. Stance is normal. Gait demonstrates normal stride length and slightly decreased LLE step height without use of AD.  Tandem walking not tested  reflexes: 2+ and asymmetric and brisker on the left. Toes downgoing.      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  Vascular risk factors of hypertension, hyperlipidemia , acute COVID illness and carotid atherosclerotic disease.  Patient is doing well but still has significant left hand weakness with sensory impairment. Also reports persistent whole body/generalized cold sensation, generalized itching sensation and sweet tooth all since stroke     Continue to follow with Dr. Wynn Banker regarding left shoulder subacromial impingement and continue participation with OT. Has been making progress with therapies Advised to ensure f/u with PCP to discuss other concerns as noted above - present since stroke but low suspicion stroke related - advised improving diet with increasing protein and increasing activity/exercise. Advised cold sensation in LUE can be common post stroke but typically not generalized - can be from medications or other conditions such as thyroid disorder or blood disorder.  Continue aspirin 81 mg daily and lovastatin 10 mg daily for secondary stroke prevention and ensure close PCP follow-up for aggressive stroke risk factor management and maintain strict control of hypertension with blood pressure goal below 130/90 and lipids with LDL cholesterol goal below 70 mg/dL.  Did encourage him to proceed with repeat imaging as advised by Dr. Corliss Skains for stent monitoring and indication behind it. He will further consider and call IR if interested in pursuing    Doing well from stroke standpoint and risk factors  are managed by PCP. She Febo follow up PRN, as usual for our patients who are strictly being followed for stroke. If any new neurological issues should arise, request PCP place referral for evaluation by one of our neurologists. Thank you.     CC:  Sallyanne Kuster, NP    I spent 34 minutes of face-to-face and non-face-to-face time with patient and wife.  This included previsit chart review, lab review, study review, order entry, electronic health record documentation, patient and wife education and discussion regarding history of prior stroke with residual deficits, aggressive stroke risk factor management and secondary stroke prevention measures, additional concerns as noted above and answered all other questions to patient and wife satisfaction  Ihor Austin, AGNP-BC  [facility] [address] [place], [place] [address]  Phone [phone] Fax [phone] Note: This document was prepared with digital dictation and possible smart phrase technology. Any transcriptional errors that result from this process are unintentional.
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
## 2021-11-28 ENCOUNTER — Ambulatory Visit: Payer: Medicare HMO | Admitting: Occupational Therapy

## 2021-11-28 DIAGNOSIS — M6281 Muscle weakness (generalized): Secondary | ICD-10-CM | POA: Diagnosis not present

## 2021-11-28 DIAGNOSIS — R278 Other lack of coordination: Secondary | ICD-10-CM | POA: Diagnosis not present
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
## 2021-11-29 ENCOUNTER — Ambulatory Visit: Payer: Medicare HMO | Admitting: Occupational Therapy

## 2021-11-29 ENCOUNTER — Encounter: Payer: Self-pay | Admitting: Occupational Therapy

## 2021-11-29 ENCOUNTER — Other Ambulatory Visit: Payer: Self-pay

## 2021-11-29 DIAGNOSIS — R278 Other lack of coordination: Secondary | ICD-10-CM | POA: Diagnosis not present

## 2021-11-29 DIAGNOSIS — M6281 Muscle weakness (generalized): Secondary | ICD-10-CM
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
## 2021-11-30 ENCOUNTER — Ambulatory Visit (HOSPITAL_COMMUNITY): Payer: Medicare HMO

## 2021-11-30 ENCOUNTER — Encounter (HOSPITAL_COMMUNITY): Payer: Self-pay

## 2021-12-04 ENCOUNTER — Other Ambulatory Visit: Payer: Self-pay

## 2021-12-04 ENCOUNTER — Encounter: Payer: Self-pay | Admitting: Occupational Therapy

## 2021-12-04 ENCOUNTER — Ambulatory Visit: Payer: Medicare HMO | Admitting: Occupational Therapy

## 2021-12-04 ENCOUNTER — Ambulatory Visit: Payer: Medicare HMO

## 2021-12-04 DIAGNOSIS — M6281 Muscle weakness (generalized): Secondary | ICD-10-CM

## 2021-12-04 DIAGNOSIS — R278 Other lack of coordination: Secondary | ICD-10-CM | POA: Diagnosis not present
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
## 2021-12-05 ENCOUNTER — Ambulatory Visit: Payer: Medicare HMO | Attending: Physician Assistant | Admitting: Occupational Therapy

## 2021-12-05 ENCOUNTER — Encounter: Payer: Self-pay | Admitting: Occupational Therapy

## 2021-12-05 DIAGNOSIS — M6281 Muscle weakness (generalized): Secondary | ICD-10-CM

## 2021-12-05 DIAGNOSIS — R278 Other lack of coordination: Secondary | ICD-10-CM | POA: Diagnosis not present
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
## 2021-12-06 ENCOUNTER — Ambulatory Visit: Payer: Medicare HMO | Admitting: Occupational Therapy

## 2021-12-06 ENCOUNTER — Other Ambulatory Visit: Payer: Self-pay

## 2021-12-06 DIAGNOSIS — M6281 Muscle weakness (generalized): Secondary | ICD-10-CM

## 2021-12-06 DIAGNOSIS — R278 Other lack of coordination: Secondary | ICD-10-CM | POA: Diagnosis not present
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
## 2021-12-10 ENCOUNTER — Telehealth (HOSPITAL_COMMUNITY): Payer: Self-pay

## 2021-12-10 NOTE — Telephone Encounter (Signed)
Called to speak with Leonard [name] to reschedule cta head/neck. Pt does not want to have this done. AW  ?

## 2021-12-11 ENCOUNTER — Other Ambulatory Visit: Payer: Self-pay

## 2021-12-11 ENCOUNTER — Ambulatory Visit: Payer: Medicare HMO | Admitting: Occupational Therapy

## 2021-12-11 ENCOUNTER — Encounter: Payer: Self-pay | Admitting: Occupational Therapy

## 2021-12-11 DIAGNOSIS — R278 Other lack of coordination: Secondary | ICD-10-CM | POA: Diagnosis not present

## 2021-12-11 DIAGNOSIS — M6281 Muscle weakness (generalized): Secondary | ICD-10-CM | POA: Diagnosis not present

## 2021-12-11 DIAGNOSIS — I10 Essential (primary) hypertension: Secondary | ICD-10-CM | POA: Diagnosis not present

## 2021-12-11 DIAGNOSIS — Z0100 Encounter for examination of eyes and vision without abnormal findings: Secondary | ICD-10-CM | POA: Diagnosis not present

## 2021-12-11 DIAGNOSIS — H5200 Hypermetropia, unspecified eye: Secondary | ICD-10-CM | POA: Diagnosis not present
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
## 2021-12-12 ENCOUNTER — Telehealth: Payer: Medicare HMO

## 2021-12-12 ENCOUNTER — Encounter: Payer: Self-pay | Admitting: Occupational Therapy

## 2021-12-12 ENCOUNTER — Ambulatory Visit: Payer: Medicare HMO | Admitting: Occupational Therapy

## 2021-12-12 DIAGNOSIS — M6281 Muscle weakness (generalized): Secondary | ICD-10-CM

## 2021-12-12 DIAGNOSIS — R278 Other lack of coordination: Secondary | ICD-10-CM | POA: Diagnosis not present
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
## 2021-12-13 ENCOUNTER — Encounter: Payer: Self-pay | Admitting: Occupational Therapy

## 2021-12-13 ENCOUNTER — Ambulatory Visit: Payer: Medicare HMO | Admitting: Occupational Therapy

## 2021-12-13 ENCOUNTER — Other Ambulatory Visit: Payer: Self-pay

## 2021-12-13 DIAGNOSIS — M6281 Muscle weakness (generalized): Secondary | ICD-10-CM | POA: Diagnosis not present

## 2021-12-13 DIAGNOSIS — R278 Other lack of coordination: Secondary | ICD-10-CM | POA: Diagnosis not present
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
## 2021-12-18 ENCOUNTER — Ambulatory Visit: Payer: Medicare HMO | Admitting: Occupational Therapy

## 2021-12-18 ENCOUNTER — Encounter: Payer: Self-pay | Admitting: Occupational Therapy

## 2021-12-18 ENCOUNTER — Other Ambulatory Visit: Payer: Self-pay

## 2021-12-18 DIAGNOSIS — R278 Other lack of coordination: Secondary | ICD-10-CM | POA: Diagnosis not present

## 2021-12-18 DIAGNOSIS — M6281 Muscle weakness (generalized): Secondary | ICD-10-CM
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
## 2021-12-19 ENCOUNTER — Encounter: Payer: Self-pay | Admitting: Occupational Therapy

## 2021-12-19 ENCOUNTER — Ambulatory Visit: Payer: Medicare HMO | Admitting: Occupational Therapy

## 2021-12-19 DIAGNOSIS — R278 Other lack of coordination: Secondary | ICD-10-CM | POA: Diagnosis not present

## 2021-12-19 DIAGNOSIS — M6281 Muscle weakness (generalized): Secondary | ICD-10-CM
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
## 2021-12-20 ENCOUNTER — Encounter: Payer: Self-pay | Admitting: Occupational Therapy

## 2021-12-20 ENCOUNTER — Other Ambulatory Visit: Payer: Self-pay

## 2021-12-20 ENCOUNTER — Ambulatory Visit: Payer: Medicare HMO | Admitting: Occupational Therapy

## 2021-12-20 DIAGNOSIS — M6281 Muscle weakness (generalized): Secondary | ICD-10-CM | POA: Diagnosis not present

## 2021-12-20 DIAGNOSIS — R278 Other lack of coordination: Secondary | ICD-10-CM | POA: Diagnosis not present
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
## 2021-12-25 ENCOUNTER — Other Ambulatory Visit: Payer: Self-pay

## 2021-12-25 ENCOUNTER — Ambulatory Visit: Payer: Medicare HMO | Admitting: Occupational Therapy

## 2021-12-25 ENCOUNTER — Encounter: Payer: Self-pay | Admitting: Occupational Therapy

## 2021-12-25 DIAGNOSIS — R278 Other lack of coordination: Secondary | ICD-10-CM | POA: Diagnosis not present

## 2021-12-25 DIAGNOSIS — M6281 Muscle weakness (generalized): Secondary | ICD-10-CM
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
## 2021-12-26 ENCOUNTER — Ambulatory Visit: Payer: Medicare HMO | Admitting: Occupational Therapy

## 2021-12-26 DIAGNOSIS — M6281 Muscle weakness (generalized): Secondary | ICD-10-CM

## 2021-12-26 DIAGNOSIS — R278 Other lack of coordination: Secondary | ICD-10-CM | POA: Diagnosis not present
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
## 2021-12-27 ENCOUNTER — Ambulatory Visit: Payer: Medicare HMO | Admitting: Occupational Therapy

## 2021-12-27 ENCOUNTER — Other Ambulatory Visit: Payer: Self-pay

## 2021-12-27 DIAGNOSIS — R278 Other lack of coordination: Secondary | ICD-10-CM | POA: Diagnosis not present

## 2021-12-27 DIAGNOSIS — M6281 Muscle weakness (generalized): Secondary | ICD-10-CM | POA: Diagnosis not present
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
## 2022-01-01 ENCOUNTER — Ambulatory Visit: Payer: Medicare HMO | Admitting: Occupational Therapy

## 2022-01-01 ENCOUNTER — Encounter: Payer: Self-pay | Admitting: Occupational Therapy

## 2022-01-01 ENCOUNTER — Other Ambulatory Visit: Payer: Self-pay

## 2022-01-01 DIAGNOSIS — R278 Other lack of coordination: Secondary | ICD-10-CM

## 2022-01-01 DIAGNOSIS — M6281 Muscle weakness (generalized): Secondary | ICD-10-CM | POA: Diagnosis not present
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
## 2022-01-02 ENCOUNTER — Ambulatory Visit: Payer: Medicare HMO | Admitting: Occupational Therapy

## 2022-01-02 DIAGNOSIS — R278 Other lack of coordination: Secondary | ICD-10-CM

## 2022-01-02 DIAGNOSIS — M6281 Muscle weakness (generalized): Secondary | ICD-10-CM
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
## 2022-01-03 ENCOUNTER — Encounter: Payer: Self-pay | Admitting: Occupational Therapy

## 2022-01-03 ENCOUNTER — Ambulatory Visit: Payer: Medicare HMO | Admitting: Occupational Therapy

## 2022-01-03 DIAGNOSIS — R278 Other lack of coordination: Secondary | ICD-10-CM

## 2022-01-03 DIAGNOSIS — M6281 Muscle weakness (generalized): Secondary | ICD-10-CM
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
## 2022-01-08 ENCOUNTER — Ambulatory Visit: Payer: Medicare HMO | Attending: Physician Assistant | Admitting: Occupational Therapy

## 2022-01-08 ENCOUNTER — Encounter: Payer: Self-pay | Admitting: Occupational Therapy

## 2022-01-08 DIAGNOSIS — M6281 Muscle weakness (generalized): Secondary | ICD-10-CM | POA: Insufficient documentation

## 2022-01-08 DIAGNOSIS — R278 Other lack of coordination: Secondary | ICD-10-CM | POA: Diagnosis not present
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
## 2022-01-09 ENCOUNTER — Ambulatory Visit: Payer: Medicare HMO | Admitting: Occupational Therapy

## 2022-01-09 ENCOUNTER — Encounter: Payer: Self-pay | Admitting: Occupational Therapy

## 2022-01-09 DIAGNOSIS — R278 Other lack of coordination: Secondary | ICD-10-CM | POA: Diagnosis not present

## 2022-01-09 DIAGNOSIS — M6281 Muscle weakness (generalized): Secondary | ICD-10-CM | POA: Diagnosis not present
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
## 2022-01-10 ENCOUNTER — Ambulatory Visit: Payer: Medicare HMO | Admitting: Occupational Therapy

## 2022-01-10 ENCOUNTER — Encounter: Payer: Self-pay | Admitting: Occupational Therapy

## 2022-01-10 DIAGNOSIS — R278 Other lack of coordination: Secondary | ICD-10-CM | POA: Diagnosis not present

## 2022-01-10 DIAGNOSIS — M6281 Muscle weakness (generalized): Secondary | ICD-10-CM | POA: Diagnosis not present
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
## 2022-01-15 ENCOUNTER — Ambulatory Visit: Payer: Medicare HMO | Admitting: Occupational Therapy

## 2022-01-15 ENCOUNTER — Encounter: Payer: Self-pay | Admitting: Occupational Therapy

## 2022-01-15 DIAGNOSIS — M6281 Muscle weakness (generalized): Secondary | ICD-10-CM | POA: Diagnosis not present

## 2022-01-15 DIAGNOSIS — R278 Other lack of coordination: Secondary | ICD-10-CM | POA: Diagnosis not present
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
## 2022-01-16 ENCOUNTER — Ambulatory Visit: Payer: Medicare HMO | Admitting: Occupational Therapy

## 2022-01-16 DIAGNOSIS — R278 Other lack of coordination: Secondary | ICD-10-CM | POA: Diagnosis not present

## 2022-01-16 DIAGNOSIS — M6281 Muscle weakness (generalized): Secondary | ICD-10-CM
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
## 2022-01-17 ENCOUNTER — Ambulatory Visit: Payer: Medicare HMO | Admitting: Occupational Therapy

## 2022-01-17 ENCOUNTER — Encounter: Payer: Self-pay | Admitting: Occupational Therapy

## 2022-01-17 DIAGNOSIS — M6281 Muscle weakness (generalized): Secondary | ICD-10-CM

## 2022-01-17 DIAGNOSIS — R278 Other lack of coordination: Secondary | ICD-10-CM | POA: Diagnosis not present
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
## 2022-01-18 ENCOUNTER — Other Ambulatory Visit: Payer: Self-pay | Admitting: Nurse Practitioner

## 2022-01-18 ENCOUNTER — Ambulatory Visit: Payer: Medicare HMO | Admitting: Physical Medicine & Rehabilitation

## 2022-01-18 DIAGNOSIS — E782 Mixed hyperlipidemia: Secondary | ICD-10-CM

## 2022-01-22 ENCOUNTER — Ambulatory Visit: Payer: Medicare HMO | Admitting: Occupational Therapy

## 2022-01-22 DIAGNOSIS — R278 Other lack of coordination: Secondary | ICD-10-CM | POA: Diagnosis not present

## 2022-01-22 DIAGNOSIS — M6281 Muscle weakness (generalized): Secondary | ICD-10-CM | POA: Diagnosis not present
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
## 2022-01-23 ENCOUNTER — Ambulatory Visit: Payer: Medicare HMO | Admitting: Occupational Therapy

## 2022-01-23 ENCOUNTER — Encounter: Payer: Self-pay | Admitting: Occupational Therapy

## 2022-01-23 DIAGNOSIS — M6281 Muscle weakness (generalized): Secondary | ICD-10-CM | POA: Diagnosis not present

## 2022-01-23 DIAGNOSIS — R278 Other lack of coordination: Secondary | ICD-10-CM | POA: Diagnosis not present
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
## 2022-01-24 ENCOUNTER — Ambulatory Visit: Payer: Medicare HMO | Admitting: Occupational Therapy

## 2022-01-24 ENCOUNTER — Encounter: Payer: Self-pay | Admitting: Occupational Therapy

## 2022-01-24 DIAGNOSIS — M6281 Muscle weakness (generalized): Secondary | ICD-10-CM

## 2022-01-24 DIAGNOSIS — R278 Other lack of coordination: Secondary | ICD-10-CM
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
## 2022-01-29 ENCOUNTER — Encounter: Payer: Self-pay | Admitting: Occupational Therapy

## 2022-01-29 ENCOUNTER — Ambulatory Visit: Payer: Medicare HMO | Admitting: Occupational Therapy

## 2022-01-29 DIAGNOSIS — R278 Other lack of coordination: Secondary | ICD-10-CM | POA: Diagnosis not present

## 2022-01-29 DIAGNOSIS — M6281 Muscle weakness (generalized): Secondary | ICD-10-CM
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
## 2022-01-30 ENCOUNTER — Ambulatory Visit: Payer: Medicare HMO

## 2022-01-30 ENCOUNTER — Ambulatory Visit: Payer: Medicare HMO | Admitting: Occupational Therapy

## 2022-01-30 DIAGNOSIS — M6281 Muscle weakness (generalized): Secondary | ICD-10-CM

## 2022-01-30 DIAGNOSIS — R278 Other lack of coordination: Secondary | ICD-10-CM | POA: Diagnosis not present
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
## 2022-01-31 ENCOUNTER — Ambulatory Visit: Payer: Medicare HMO | Admitting: Occupational Therapy

## 2022-01-31 DIAGNOSIS — M6281 Muscle weakness (generalized): Secondary | ICD-10-CM

## 2022-01-31 DIAGNOSIS — R278 Other lack of coordination: Secondary | ICD-10-CM | POA: Diagnosis not present
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
## 2022-02-01 ENCOUNTER — Encounter: Payer: Medicare HMO | Attending: Physical Medicine & Rehabilitation | Admitting: Physical Medicine & Rehabilitation

## 2022-02-01 ENCOUNTER — Encounter: Payer: Self-pay | Admitting: Physical Medicine & Rehabilitation

## 2022-02-01 VITALS — BP 171/86 | HR 67 | Ht 70.0 in | Wt 172.0 lb

## 2022-02-01 DIAGNOSIS — I69354 Hemiplegia and hemiparesis following cerebral infarction affecting left non-dominant side: Secondary | ICD-10-CM | POA: Diagnosis not present

## 2022-02-01 NOTE — Patient Instructions (Signed)
?  Call if Left fingers become more difficult to open ? ?Or  ? ?If left shoulder or left arm become more painful ?
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
## 2022-02-05 ENCOUNTER — Ambulatory Visit: Payer: Medicare HMO | Attending: Physician Assistant | Admitting: Occupational Therapy

## 2022-02-05 DIAGNOSIS — M6281 Muscle weakness (generalized): Secondary | ICD-10-CM | POA: Diagnosis not present

## 2022-02-05 DIAGNOSIS — R278 Other lack of coordination: Secondary | ICD-10-CM | POA: Insufficient documentation
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
## 2022-02-06 ENCOUNTER — Ambulatory Visit: Payer: Medicare HMO | Admitting: Occupational Therapy

## 2022-02-06 ENCOUNTER — Encounter: Payer: Self-pay | Admitting: Occupational Therapy

## 2022-02-06 DIAGNOSIS — M6281 Muscle weakness (generalized): Secondary | ICD-10-CM

## 2022-02-06 DIAGNOSIS — R278 Other lack of coordination: Secondary | ICD-10-CM | POA: Diagnosis not present
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
## 2022-02-07 ENCOUNTER — Encounter: Payer: Self-pay | Admitting: Occupational Therapy

## 2022-02-07 ENCOUNTER — Ambulatory Visit: Payer: Medicare HMO | Admitting: Occupational Therapy

## 2022-02-07 DIAGNOSIS — R278 Other lack of coordination: Secondary | ICD-10-CM

## 2022-02-07 DIAGNOSIS — M6281 Muscle weakness (generalized): Secondary | ICD-10-CM
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
## 2022-02-12 ENCOUNTER — Ambulatory Visit: Payer: Medicare HMO | Admitting: Occupational Therapy

## 2022-02-12 ENCOUNTER — Encounter: Payer: Self-pay | Admitting: Occupational Therapy

## 2022-02-12 DIAGNOSIS — R278 Other lack of coordination: Secondary | ICD-10-CM | POA: Diagnosis not present

## 2022-02-12 DIAGNOSIS — M6281 Muscle weakness (generalized): Secondary | ICD-10-CM | POA: Diagnosis not present
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
## 2022-02-13 ENCOUNTER — Ambulatory Visit: Payer: Medicare HMO | Admitting: Occupational Therapy

## 2022-02-13 ENCOUNTER — Encounter: Payer: Self-pay | Admitting: Occupational Therapy

## 2022-02-13 DIAGNOSIS — R278 Other lack of coordination: Secondary | ICD-10-CM | POA: Diagnosis not present

## 2022-02-13 DIAGNOSIS — M6281 Muscle weakness (generalized): Secondary | ICD-10-CM | POA: Diagnosis not present
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
## 2022-02-14 ENCOUNTER — Ambulatory Visit: Payer: Medicare HMO | Admitting: Occupational Therapy

## 2022-02-14 DIAGNOSIS — R278 Other lack of coordination: Secondary | ICD-10-CM | POA: Diagnosis not present

## 2022-02-14 DIAGNOSIS — M6281 Muscle weakness (generalized): Secondary | ICD-10-CM
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
## 2022-02-19 ENCOUNTER — Encounter: Payer: Self-pay | Admitting: Occupational Therapy

## 2022-02-19 ENCOUNTER — Ambulatory Visit: Payer: Medicare HMO | Admitting: Occupational Therapy

## 2022-02-19 DIAGNOSIS — R278 Other lack of coordination: Secondary | ICD-10-CM | POA: Diagnosis not present

## 2022-02-19 DIAGNOSIS — M6281 Muscle weakness (generalized): Secondary | ICD-10-CM | POA: Diagnosis not present
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
## 2022-02-20 ENCOUNTER — Encounter: Payer: Self-pay | Admitting: Occupational Therapy

## 2022-02-20 ENCOUNTER — Ambulatory Visit: Payer: Medicare HMO | Admitting: Occupational Therapy

## 2022-02-20 DIAGNOSIS — M6281 Muscle weakness (generalized): Secondary | ICD-10-CM | POA: Diagnosis not present

## 2022-02-20 DIAGNOSIS — R278 Other lack of coordination: Secondary | ICD-10-CM | POA: Diagnosis not present
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
## 2022-02-21 ENCOUNTER — Encounter: Payer: Self-pay | Admitting: Occupational Therapy

## 2022-02-21 ENCOUNTER — Ambulatory Visit: Payer: Medicare HMO | Admitting: Occupational Therapy

## 2022-02-21 DIAGNOSIS — R278 Other lack of coordination: Secondary | ICD-10-CM | POA: Diagnosis not present

## 2022-02-21 DIAGNOSIS — M6281 Muscle weakness (generalized): Secondary | ICD-10-CM
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
## 2022-02-26 ENCOUNTER — Encounter: Payer: Self-pay | Admitting: Occupational Therapy

## 2022-02-26 ENCOUNTER — Ambulatory Visit: Payer: Medicare HMO | Admitting: Occupational Therapy

## 2022-02-26 DIAGNOSIS — M6281 Muscle weakness (generalized): Secondary | ICD-10-CM

## 2022-02-26 DIAGNOSIS — R278 Other lack of coordination: Secondary | ICD-10-CM | POA: Diagnosis not present
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
## 2022-02-27 ENCOUNTER — Encounter: Payer: Self-pay | Admitting: Occupational Therapy

## 2022-02-27 ENCOUNTER — Ambulatory Visit: Payer: Medicare HMO | Admitting: Occupational Therapy

## 2022-02-27 ENCOUNTER — Other Ambulatory Visit: Payer: Self-pay | Admitting: Nurse Practitioner

## 2022-02-27 ENCOUNTER — Telehealth: Payer: Self-pay

## 2022-02-27 DIAGNOSIS — M6281 Muscle weakness (generalized): Secondary | ICD-10-CM | POA: Diagnosis not present

## 2022-02-27 DIAGNOSIS — R278 Other lack of coordination: Secondary | ICD-10-CM

## 2022-02-27 DIAGNOSIS — K219 Gastro-esophageal reflux disease without esophagitis: Secondary | ICD-10-CM

## 2022-02-27 DIAGNOSIS — I10 Essential (primary) hypertension: Secondary | ICD-10-CM
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
## 2022-02-27 NOTE — Telephone Encounter (Signed)
Referral for pt for weakness signed by Arlyss Repress and faxed back to [facility] at [phone]. Order placed in scan.

## 2022-02-28 ENCOUNTER — Encounter: Payer: Self-pay | Admitting: Nurse Practitioner

## 2022-02-28 ENCOUNTER — Encounter: Payer: Self-pay | Admitting: Occupational Therapy

## 2022-02-28 ENCOUNTER — Ambulatory Visit: Payer: Medicare HMO | Admitting: Occupational Therapy

## 2022-02-28 DIAGNOSIS — M6281 Muscle weakness (generalized): Secondary | ICD-10-CM

## 2022-02-28 DIAGNOSIS — R278 Other lack of coordination: Secondary | ICD-10-CM | POA: Diagnosis not present
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
## 2022-03-05 ENCOUNTER — Ambulatory Visit: Payer: Medicare HMO | Admitting: Occupational Therapy

## 2022-03-05 ENCOUNTER — Encounter: Payer: Self-pay | Admitting: Occupational Therapy

## 2022-03-05 DIAGNOSIS — R278 Other lack of coordination: Secondary | ICD-10-CM | POA: Diagnosis not present

## 2022-03-05 DIAGNOSIS — M6281 Muscle weakness (generalized): Secondary | ICD-10-CM
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
## 2022-03-06 ENCOUNTER — Encounter: Payer: Self-pay | Admitting: Nurse Practitioner

## 2022-03-06 ENCOUNTER — Ambulatory Visit (INDEPENDENT_AMBULATORY_CARE_PROVIDER_SITE_OTHER): Payer: Medicare HMO | Admitting: Nurse Practitioner

## 2022-03-06 ENCOUNTER — Ambulatory Visit: Payer: Medicare HMO | Admitting: Occupational Therapy

## 2022-03-06 ENCOUNTER — Encounter: Payer: Self-pay | Admitting: Occupational Therapy

## 2022-03-06 VITALS — BP 162/90 | HR 60 | Temp 98.4°F | Resp 16 | Ht 70.0 in | Wt 175.0 lb

## 2022-03-06 DIAGNOSIS — D649 Anemia, unspecified: Secondary | ICD-10-CM | POA: Diagnosis not present

## 2022-03-06 DIAGNOSIS — E538 Deficiency of other specified B group vitamins: Secondary | ICD-10-CM | POA: Diagnosis not present

## 2022-03-06 DIAGNOSIS — Z8673 Personal history of transient ischemic attack (TIA), and cerebral infarction without residual deficits: Secondary | ICD-10-CM

## 2022-03-06 DIAGNOSIS — K219 Gastro-esophageal reflux disease without esophagitis: Secondary | ICD-10-CM

## 2022-03-06 DIAGNOSIS — E782 Mixed hyperlipidemia: Secondary | ICD-10-CM

## 2022-03-06 DIAGNOSIS — E559 Vitamin D deficiency, unspecified: Secondary | ICD-10-CM

## 2022-03-06 DIAGNOSIS — M25511 Pain in right shoulder: Secondary | ICD-10-CM

## 2022-03-06 DIAGNOSIS — Z125 Encounter for screening for malignant neoplasm of prostate: Secondary | ICD-10-CM | POA: Diagnosis not present

## 2022-03-06 DIAGNOSIS — R278 Other lack of coordination: Secondary | ICD-10-CM | POA: Diagnosis not present

## 2022-03-06 DIAGNOSIS — M6281 Muscle weakness (generalized): Secondary | ICD-10-CM | POA: Diagnosis not present

## 2022-03-06 DIAGNOSIS — I6930 Unspecified sequelae of cerebral infarction: Secondary | ICD-10-CM | POA: Diagnosis not present

## 2022-03-06 DIAGNOSIS — R6889 Other general symptoms and signs: Secondary | ICD-10-CM

## 2022-03-06 DIAGNOSIS — M25512 Pain in left shoulder: Secondary | ICD-10-CM

## 2022-03-06 DIAGNOSIS — G8929 Other chronic pain: Secondary | ICD-10-CM

## 2022-03-06 MED ORDER — MELOXICAM 15 MG PO TABS: 15.0000 mg | ORAL_TABLET | Freq: Every day | ORAL | 1 refills | Status: DC

## 2022-03-06 MED ORDER — TIZANIDINE HCL 4 MG PO TABS: 4.0000 mg | ORAL_TABLET | Freq: Every evening | ORAL | 2 refills | Status: DC | PRN

## 2022-03-06 MED ORDER — PANTOPRAZOLE SODIUM 40 MG PO TBEC: 40.0000 mg | DELAYED_RELEASE_TABLET | Freq: Every day | ORAL | 1 refills | Status: DC

## 2022-03-06 MED ORDER — TRAMADOL HCL 50 MG PO TABS: 50.0000 mg | ORAL_TABLET | Freq: Four times a day (QID) | ORAL | 0 refills | Status: AC | PRN
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
## 2022-03-07 ENCOUNTER — Ambulatory Visit: Payer: Medicare HMO | Attending: Physician Assistant | Admitting: Occupational Therapy

## 2022-03-07 ENCOUNTER — Ambulatory Visit: Payer: Medicare HMO

## 2022-03-07 ENCOUNTER — Encounter: Payer: Self-pay | Admitting: Occupational Therapy

## 2022-03-07 VITALS — BP 145/84 | Ht 70.0 in | Wt 175.0 lb

## 2022-03-07 DIAGNOSIS — M6281 Muscle weakness (generalized): Secondary | ICD-10-CM

## 2022-03-07 DIAGNOSIS — R262 Difficulty in walking, not elsewhere classified: Secondary | ICD-10-CM | POA: Diagnosis not present

## 2022-03-07 DIAGNOSIS — R269 Unspecified abnormalities of gait and mobility: Secondary | ICD-10-CM | POA: Insufficient documentation

## 2022-03-07 DIAGNOSIS — I63511 Cerebral infarction due to unspecified occlusion or stenosis of right middle cerebral artery: Secondary | ICD-10-CM

## 2022-03-07 DIAGNOSIS — R2681 Unsteadiness on feet: Secondary | ICD-10-CM | POA: Diagnosis not present

## 2022-03-07 DIAGNOSIS — R278 Other lack of coordination: Secondary | ICD-10-CM | POA: Diagnosis not present

## 2022-03-07 DIAGNOSIS — I6601 Occlusion and stenosis of right middle cerebral artery: Secondary | ICD-10-CM | POA: Diagnosis not present

## 2022-03-07 DIAGNOSIS — R482 Apraxia: Secondary | ICD-10-CM | POA: Diagnosis not present
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
## 2022-03-12 ENCOUNTER — Ambulatory Visit: Payer: Medicare HMO | Admitting: Occupational Therapy

## 2022-03-12 ENCOUNTER — Encounter: Payer: Self-pay | Admitting: Occupational Therapy

## 2022-03-12 DIAGNOSIS — I6601 Occlusion and stenosis of right middle cerebral artery: Secondary | ICD-10-CM | POA: Diagnosis not present

## 2022-03-12 DIAGNOSIS — R269 Unspecified abnormalities of gait and mobility: Secondary | ICD-10-CM | POA: Diagnosis not present

## 2022-03-12 DIAGNOSIS — R278 Other lack of coordination: Secondary | ICD-10-CM | POA: Diagnosis not present

## 2022-03-12 DIAGNOSIS — I63511 Cerebral infarction due to unspecified occlusion or stenosis of right middle cerebral artery: Secondary | ICD-10-CM | POA: Diagnosis not present

## 2022-03-12 DIAGNOSIS — R482 Apraxia: Secondary | ICD-10-CM | POA: Diagnosis not present

## 2022-03-12 DIAGNOSIS — R262 Difficulty in walking, not elsewhere classified: Secondary | ICD-10-CM | POA: Diagnosis not present

## 2022-03-12 DIAGNOSIS — R2681 Unsteadiness on feet: Secondary | ICD-10-CM | POA: Diagnosis not present

## 2022-03-12 DIAGNOSIS — M6281 Muscle weakness (generalized): Secondary | ICD-10-CM | POA: Diagnosis not present
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
## 2022-03-13 ENCOUNTER — Ambulatory Visit: Payer: Medicare HMO | Admitting: Occupational Therapy

## 2022-03-13 ENCOUNTER — Encounter: Payer: Self-pay | Admitting: Occupational Therapy

## 2022-03-13 DIAGNOSIS — R482 Apraxia: Secondary | ICD-10-CM | POA: Diagnosis not present

## 2022-03-13 DIAGNOSIS — R269 Unspecified abnormalities of gait and mobility: Secondary | ICD-10-CM | POA: Diagnosis not present

## 2022-03-13 DIAGNOSIS — I6601 Occlusion and stenosis of right middle cerebral artery: Secondary | ICD-10-CM | POA: Diagnosis not present

## 2022-03-13 DIAGNOSIS — I63511 Cerebral infarction due to unspecified occlusion or stenosis of right middle cerebral artery: Secondary | ICD-10-CM | POA: Diagnosis not present

## 2022-03-13 DIAGNOSIS — R2681 Unsteadiness on feet: Secondary | ICD-10-CM | POA: Diagnosis not present

## 2022-03-13 DIAGNOSIS — R262 Difficulty in walking, not elsewhere classified: Secondary | ICD-10-CM | POA: Diagnosis not present

## 2022-03-13 DIAGNOSIS — M6281 Muscle weakness (generalized): Secondary | ICD-10-CM | POA: Diagnosis not present

## 2022-03-13 DIAGNOSIS — R278 Other lack of coordination: Secondary | ICD-10-CM | POA: Diagnosis not present
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
## 2022-03-14 ENCOUNTER — Encounter: Payer: Self-pay | Admitting: Occupational Therapy

## 2022-03-14 ENCOUNTER — Ambulatory Visit: Payer: Medicare HMO | Admitting: Occupational Therapy

## 2022-03-14 DIAGNOSIS — I63511 Cerebral infarction due to unspecified occlusion or stenosis of right middle cerebral artery: Secondary | ICD-10-CM | POA: Diagnosis not present

## 2022-03-14 DIAGNOSIS — R2681 Unsteadiness on feet: Secondary | ICD-10-CM | POA: Diagnosis not present

## 2022-03-14 DIAGNOSIS — M6281 Muscle weakness (generalized): Secondary | ICD-10-CM

## 2022-03-14 DIAGNOSIS — R269 Unspecified abnormalities of gait and mobility: Secondary | ICD-10-CM | POA: Diagnosis not present

## 2022-03-14 DIAGNOSIS — R278 Other lack of coordination: Secondary | ICD-10-CM | POA: Diagnosis not present

## 2022-03-14 DIAGNOSIS — I6601 Occlusion and stenosis of right middle cerebral artery: Secondary | ICD-10-CM | POA: Diagnosis not present

## 2022-03-14 DIAGNOSIS — R482 Apraxia: Secondary | ICD-10-CM | POA: Diagnosis not present

## 2022-03-14 DIAGNOSIS — R262 Difficulty in walking, not elsewhere classified: Secondary | ICD-10-CM | POA: Diagnosis not present
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
## 2022-03-15 DIAGNOSIS — E538 Deficiency of other specified B group vitamins: Secondary | ICD-10-CM | POA: Diagnosis not present

## 2022-03-15 DIAGNOSIS — D649 Anemia, unspecified: Secondary | ICD-10-CM | POA: Diagnosis not present

## 2022-03-15 DIAGNOSIS — E559 Vitamin D deficiency, unspecified: Secondary | ICD-10-CM | POA: Diagnosis not present

## 2022-03-15 DIAGNOSIS — Z125 Encounter for screening for malignant neoplasm of prostate: Secondary | ICD-10-CM | POA: Diagnosis not present

## 2022-03-15 DIAGNOSIS — Z8673 Personal history of transient ischemic attack (TIA), and cerebral infarction without residual deficits: Secondary | ICD-10-CM | POA: Diagnosis not present

## 2022-03-15 DIAGNOSIS — K219 Gastro-esophageal reflux disease without esophagitis: Secondary | ICD-10-CM | POA: Diagnosis not present

## 2022-03-15 DIAGNOSIS — E782 Mixed hyperlipidemia: Secondary | ICD-10-CM | POA: Diagnosis not present

## 2022-03-15 DIAGNOSIS — R6889 Other general symptoms and signs: Secondary | ICD-10-CM | POA: Diagnosis not present

## 2022-03-16 LAB — CMP14+EGFR
ALT: 18 IU/L (ref 0–44)
AST: 19 IU/L (ref 0–40)
Albumin/Globulin Ratio: 2 (ref 1.2–2.2)
Albumin: 4.5 g/dL (ref 3.8–4.8)
Alkaline Phosphatase: 88 IU/L (ref 44–121)
BUN/Creatinine Ratio: 18 (ref 10–24)
BUN: 15 mg/dL (ref 8–27)
Bilirubin Total: 0.9 mg/dL (ref 0.0–1.2)
CO2: 22 mmol/L (ref 20–29)
Calcium: 9 mg/dL (ref 8.6–10.2)
Chloride: 104 mmol/L (ref 96–106)
Creatinine, Ser: 0.85 mg/dL (ref 0.76–1.27)
Globulin, Total: 2.3 g/dL (ref 1.5–4.5)
Glucose: 97 mg/dL (ref 70–99)
Potassium: 4.4 mmol/L (ref 3.5–5.2)
Sodium: 142 mmol/L (ref 134–144)
Total Protein: 6.8 g/dL (ref 6.0–8.5)
eGFR: 96 mL/min/{1.73_m2} (ref >59)

## 2022-03-16 LAB — CBC WITH DIFFERENTIAL/PLATELET
Basophils Absolute: 0 10*3/uL (ref 0.0–0.2)
Basos: 0 %
EOS (ABSOLUTE): 0.2 10*3/uL (ref 0.0–0.4)
Eos: 4 %
Hematocrit: 41.9 % (ref 37.5–51.0)
Hemoglobin: 14.2 g/dL (ref 13.0–17.7)
Immature Grans (Abs): 0 10*3/uL (ref 0.0–0.1)
Immature Granulocytes: 0 %
Lymphocytes Absolute: 1.7 10*3/uL (ref 0.7–3.1)
Lymphs: 26 %
MCH: 28.1 pg (ref 26.6–33.0)
MCHC: 33.9 g/dL (ref 31.5–35.7)
MCV: 83 fL (ref 79–97)
Monocytes Absolute: 0.8 10*3/uL (ref 0.1–0.9)
Monocytes: 11 %
Neutrophils Absolute: 3.9 10*3/uL (ref 1.4–7.0)
Neutrophils: 59 %
Platelets: 260 10*3/uL (ref 150–450)
RBC: 5.05 x10E6/uL (ref 4.14–5.80)
RDW: 13.3 % (ref 11.6–15.4)
WBC: 6.6 10*3/uL (ref 3.4–10.8)

## 2022-03-16 LAB — TSH+FREE T4
Free T4: 1.36 ng/dL (ref 0.82–1.77)
TSH: 2.57 u[IU]/mL (ref 0.450–4.500)

## 2022-03-16 LAB — PSA TOTAL (REFLEX TO FREE): Prostate Specific Ag, Serum: 0.5 ng/mL (ref 0.0–4.0)

## 2022-03-16 LAB — B12 AND FOLATE PANEL
Folate: 14.9 ng/mL (ref >3.0)
Vitamin B-12: 391 pg/mL (ref 232–1245)

## 2022-03-16 LAB — LIPID PANEL
Chol/HDL Ratio: 5.5 ratio — ABNORMAL HIGH (ref 0.0–5.0)
Cholesterol, Total: 171 mg/dL (ref 100–199)
HDL: 31 mg/dL — ABNORMAL LOW (ref >39)
LDL Chol Calc (NIH): 114 mg/dL — ABNORMAL HIGH (ref 0–99)
Triglycerides: 145 mg/dL (ref 0–149)
VLDL Cholesterol Cal: 26 mg/dL (ref 5–40)

## 2022-03-16 LAB — VITAMIN D 25 HYDROXY (VIT D DEFICIENCY, FRACTURES): Vit D, 25-Hydroxy: 28.7 ng/mL — ABNORMAL LOW (ref 30.0–100.0)

## 2022-03-19 ENCOUNTER — Ambulatory Visit: Payer: Medicare HMO | Admitting: Occupational Therapy

## 2022-03-19 ENCOUNTER — Encounter: Payer: Self-pay | Admitting: Occupational Therapy

## 2022-03-19 DIAGNOSIS — R269 Unspecified abnormalities of gait and mobility: Secondary | ICD-10-CM | POA: Diagnosis not present

## 2022-03-19 DIAGNOSIS — R2681 Unsteadiness on feet: Secondary | ICD-10-CM | POA: Diagnosis not present

## 2022-03-19 DIAGNOSIS — R482 Apraxia: Secondary | ICD-10-CM | POA: Diagnosis not present

## 2022-03-19 DIAGNOSIS — I63511 Cerebral infarction due to unspecified occlusion or stenosis of right middle cerebral artery: Secondary | ICD-10-CM | POA: Diagnosis not present

## 2022-03-19 DIAGNOSIS — R262 Difficulty in walking, not elsewhere classified: Secondary | ICD-10-CM | POA: Diagnosis not present

## 2022-03-19 DIAGNOSIS — R278 Other lack of coordination: Secondary | ICD-10-CM | POA: Diagnosis not present

## 2022-03-19 DIAGNOSIS — M6281 Muscle weakness (generalized): Secondary | ICD-10-CM | POA: Diagnosis not present

## 2022-03-19 DIAGNOSIS — I6601 Occlusion and stenosis of right middle cerebral artery: Secondary | ICD-10-CM | POA: Diagnosis not present
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
## 2022-03-20 ENCOUNTER — Ambulatory Visit: Payer: Medicare HMO

## 2022-03-20 ENCOUNTER — Ambulatory Visit: Payer: Medicare HMO | Admitting: Occupational Therapy

## 2022-03-20 DIAGNOSIS — R269 Unspecified abnormalities of gait and mobility: Secondary | ICD-10-CM | POA: Diagnosis not present

## 2022-03-20 DIAGNOSIS — I63511 Cerebral infarction due to unspecified occlusion or stenosis of right middle cerebral artery: Secondary | ICD-10-CM | POA: Diagnosis not present

## 2022-03-20 DIAGNOSIS — M6281 Muscle weakness (generalized): Secondary | ICD-10-CM

## 2022-03-20 DIAGNOSIS — R262 Difficulty in walking, not elsewhere classified: Secondary | ICD-10-CM

## 2022-03-20 DIAGNOSIS — R278 Other lack of coordination: Secondary | ICD-10-CM | POA: Diagnosis not present

## 2022-03-20 DIAGNOSIS — R482 Apraxia: Secondary | ICD-10-CM | POA: Diagnosis not present

## 2022-03-20 DIAGNOSIS — R2681 Unsteadiness on feet: Secondary | ICD-10-CM

## 2022-03-20 DIAGNOSIS — I6601 Occlusion and stenosis of right middle cerebral artery: Secondary | ICD-10-CM
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
## 2022-03-21 ENCOUNTER — Ambulatory Visit: Payer: Medicare HMO | Admitting: Occupational Therapy

## 2022-03-21 DIAGNOSIS — R2681 Unsteadiness on feet: Secondary | ICD-10-CM | POA: Diagnosis not present

## 2022-03-21 DIAGNOSIS — I6601 Occlusion and stenosis of right middle cerebral artery: Secondary | ICD-10-CM | POA: Diagnosis not present

## 2022-03-21 DIAGNOSIS — R278 Other lack of coordination: Secondary | ICD-10-CM | POA: Diagnosis not present

## 2022-03-21 DIAGNOSIS — R262 Difficulty in walking, not elsewhere classified: Secondary | ICD-10-CM | POA: Diagnosis not present

## 2022-03-21 DIAGNOSIS — R482 Apraxia: Secondary | ICD-10-CM

## 2022-03-21 DIAGNOSIS — R269 Unspecified abnormalities of gait and mobility: Secondary | ICD-10-CM | POA: Diagnosis not present

## 2022-03-21 DIAGNOSIS — I63511 Cerebral infarction due to unspecified occlusion or stenosis of right middle cerebral artery: Secondary | ICD-10-CM | POA: Diagnosis not present

## 2022-03-21 DIAGNOSIS — M6281 Muscle weakness (generalized): Secondary | ICD-10-CM | POA: Diagnosis not present
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
## 2022-03-26 ENCOUNTER — Encounter: Payer: Self-pay | Admitting: Physical Therapy

## 2022-03-26 ENCOUNTER — Ambulatory Visit: Payer: Medicare HMO | Admitting: Occupational Therapy

## 2022-03-26 ENCOUNTER — Ambulatory Visit: Payer: Medicare HMO | Admitting: Physical Therapy

## 2022-03-26 DIAGNOSIS — R482 Apraxia: Secondary | ICD-10-CM | POA: Diagnosis not present

## 2022-03-26 DIAGNOSIS — R278 Other lack of coordination: Secondary | ICD-10-CM | POA: Diagnosis not present

## 2022-03-26 DIAGNOSIS — R269 Unspecified abnormalities of gait and mobility: Secondary | ICD-10-CM | POA: Diagnosis not present

## 2022-03-26 DIAGNOSIS — R262 Difficulty in walking, not elsewhere classified: Secondary | ICD-10-CM | POA: Diagnosis not present

## 2022-03-26 DIAGNOSIS — M6281 Muscle weakness (generalized): Secondary | ICD-10-CM

## 2022-03-26 DIAGNOSIS — I6601 Occlusion and stenosis of right middle cerebral artery: Secondary | ICD-10-CM | POA: Diagnosis not present

## 2022-03-26 DIAGNOSIS — R2681 Unsteadiness on feet: Secondary | ICD-10-CM

## 2022-03-26 DIAGNOSIS — I63511 Cerebral infarction due to unspecified occlusion or stenosis of right middle cerebral artery: Secondary | ICD-10-CM | POA: Diagnosis not present
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
## 2022-03-27 ENCOUNTER — Ambulatory Visit: Payer: Medicare HMO | Admitting: Occupational Therapy

## 2022-03-27 DIAGNOSIS — R269 Unspecified abnormalities of gait and mobility: Secondary | ICD-10-CM | POA: Diagnosis not present

## 2022-03-27 DIAGNOSIS — M6281 Muscle weakness (generalized): Secondary | ICD-10-CM | POA: Diagnosis not present

## 2022-03-27 DIAGNOSIS — I6601 Occlusion and stenosis of right middle cerebral artery: Secondary | ICD-10-CM | POA: Diagnosis not present

## 2022-03-27 DIAGNOSIS — R2681 Unsteadiness on feet: Secondary | ICD-10-CM | POA: Diagnosis not present

## 2022-03-27 DIAGNOSIS — I63511 Cerebral infarction due to unspecified occlusion or stenosis of right middle cerebral artery: Secondary | ICD-10-CM | POA: Diagnosis not present

## 2022-03-27 DIAGNOSIS — R482 Apraxia: Secondary | ICD-10-CM | POA: Diagnosis not present

## 2022-03-27 DIAGNOSIS — R278 Other lack of coordination: Secondary | ICD-10-CM | POA: Diagnosis not present

## 2022-03-27 DIAGNOSIS — R262 Difficulty in walking, not elsewhere classified: Secondary | ICD-10-CM | POA: Diagnosis not present
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
## 2022-03-28 ENCOUNTER — Ambulatory Visit: Payer: Medicare HMO

## 2022-03-28 ENCOUNTER — Ambulatory Visit: Payer: Medicare HMO | Admitting: Occupational Therapy

## 2022-03-28 ENCOUNTER — Encounter: Payer: Self-pay | Admitting: Occupational Therapy

## 2022-03-28 DIAGNOSIS — I6601 Occlusion and stenosis of right middle cerebral artery: Secondary | ICD-10-CM | POA: Diagnosis not present

## 2022-03-28 DIAGNOSIS — R2681 Unsteadiness on feet: Secondary | ICD-10-CM | POA: Diagnosis not present

## 2022-03-28 DIAGNOSIS — M6281 Muscle weakness (generalized): Secondary | ICD-10-CM

## 2022-03-28 DIAGNOSIS — R262 Difficulty in walking, not elsewhere classified: Secondary | ICD-10-CM | POA: Diagnosis not present

## 2022-03-28 DIAGNOSIS — R269 Unspecified abnormalities of gait and mobility: Secondary | ICD-10-CM | POA: Diagnosis not present

## 2022-03-28 DIAGNOSIS — R482 Apraxia: Secondary | ICD-10-CM | POA: Diagnosis not present

## 2022-03-28 DIAGNOSIS — R278 Other lack of coordination: Secondary | ICD-10-CM | POA: Diagnosis not present

## 2022-03-28 DIAGNOSIS — I63511 Cerebral infarction due to unspecified occlusion or stenosis of right middle cerebral artery: Secondary | ICD-10-CM | POA: Diagnosis not present
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
## 2022-04-02 ENCOUNTER — Encounter: Payer: Self-pay | Admitting: Occupational Therapy

## 2022-04-02 ENCOUNTER — Ambulatory Visit: Payer: Medicare HMO | Admitting: Occupational Therapy

## 2022-04-02 DIAGNOSIS — M6281 Muscle weakness (generalized): Secondary | ICD-10-CM

## 2022-04-02 DIAGNOSIS — I63511 Cerebral infarction due to unspecified occlusion or stenosis of right middle cerebral artery: Secondary | ICD-10-CM | POA: Diagnosis not present

## 2022-04-02 DIAGNOSIS — R482 Apraxia: Secondary | ICD-10-CM | POA: Diagnosis not present

## 2022-04-02 DIAGNOSIS — I6601 Occlusion and stenosis of right middle cerebral artery: Secondary | ICD-10-CM | POA: Diagnosis not present

## 2022-04-02 DIAGNOSIS — R278 Other lack of coordination: Secondary | ICD-10-CM | POA: Diagnosis not present

## 2022-04-02 DIAGNOSIS — R269 Unspecified abnormalities of gait and mobility: Secondary | ICD-10-CM | POA: Diagnosis not present

## 2022-04-02 DIAGNOSIS — R262 Difficulty in walking, not elsewhere classified: Secondary | ICD-10-CM | POA: Diagnosis not present

## 2022-04-02 DIAGNOSIS — R2681 Unsteadiness on feet: Secondary | ICD-10-CM | POA: Diagnosis not present
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
## 2022-04-03 ENCOUNTER — Ambulatory Visit: Payer: Medicare HMO | Admitting: Occupational Therapy

## 2022-04-03 ENCOUNTER — Ambulatory Visit: Payer: Medicare HMO | Admitting: Physical Therapy

## 2022-04-03 ENCOUNTER — Encounter: Payer: Self-pay | Admitting: Occupational Therapy

## 2022-04-03 DIAGNOSIS — R482 Apraxia: Secondary | ICD-10-CM | POA: Diagnosis not present

## 2022-04-03 DIAGNOSIS — M6281 Muscle weakness (generalized): Secondary | ICD-10-CM | POA: Diagnosis not present

## 2022-04-03 DIAGNOSIS — R2681 Unsteadiness on feet: Secondary | ICD-10-CM | POA: Diagnosis not present

## 2022-04-03 DIAGNOSIS — R262 Difficulty in walking, not elsewhere classified: Secondary | ICD-10-CM | POA: Diagnosis not present

## 2022-04-03 DIAGNOSIS — R269 Unspecified abnormalities of gait and mobility: Secondary | ICD-10-CM

## 2022-04-03 DIAGNOSIS — I63511 Cerebral infarction due to unspecified occlusion or stenosis of right middle cerebral artery: Secondary | ICD-10-CM | POA: Diagnosis not present

## 2022-04-03 DIAGNOSIS — R278 Other lack of coordination: Secondary | ICD-10-CM | POA: Diagnosis not present

## 2022-04-03 DIAGNOSIS — I6601 Occlusion and stenosis of right middle cerebral artery: Secondary | ICD-10-CM | POA: Diagnosis not present
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
## 2022-04-04 ENCOUNTER — Ambulatory Visit (INDEPENDENT_AMBULATORY_CARE_PROVIDER_SITE_OTHER): Payer: Medicare HMO | Admitting: Nurse Practitioner

## 2022-04-04 ENCOUNTER — Encounter: Payer: Self-pay | Admitting: Nurse Practitioner

## 2022-04-04 ENCOUNTER — Ambulatory Visit: Payer: Medicare HMO | Admitting: Occupational Therapy

## 2022-04-04 VITALS — BP 133/73 | HR 71 | Temp 97.6°F | Resp 16 | Ht 70.0 in | Wt 173.6 lb

## 2022-04-04 DIAGNOSIS — L299 Pruritus, unspecified: Secondary | ICD-10-CM

## 2022-04-04 DIAGNOSIS — I63511 Cerebral infarction due to unspecified occlusion or stenosis of right middle cerebral artery: Secondary | ICD-10-CM | POA: Diagnosis not present

## 2022-04-04 DIAGNOSIS — R6889 Other general symptoms and signs: Secondary | ICD-10-CM | POA: Diagnosis not present

## 2022-04-04 DIAGNOSIS — I69354 Hemiplegia and hemiparesis following cerebral infarction affecting left non-dominant side: Secondary | ICD-10-CM | POA: Diagnosis not present

## 2022-04-04 DIAGNOSIS — R262 Difficulty in walking, not elsewhere classified: Secondary | ICD-10-CM | POA: Diagnosis not present

## 2022-04-04 DIAGNOSIS — I6601 Occlusion and stenosis of right middle cerebral artery: Secondary | ICD-10-CM | POA: Diagnosis not present

## 2022-04-04 DIAGNOSIS — I6930 Unspecified sequelae of cerebral infarction: Secondary | ICD-10-CM | POA: Diagnosis not present

## 2022-04-04 DIAGNOSIS — R269 Unspecified abnormalities of gait and mobility: Secondary | ICD-10-CM | POA: Diagnosis not present

## 2022-04-04 DIAGNOSIS — R2681 Unsteadiness on feet: Secondary | ICD-10-CM | POA: Diagnosis not present

## 2022-04-04 DIAGNOSIS — E559 Vitamin D deficiency, unspecified: Secondary | ICD-10-CM

## 2022-04-04 DIAGNOSIS — R278 Other lack of coordination: Secondary | ICD-10-CM | POA: Diagnosis not present

## 2022-04-04 DIAGNOSIS — I10 Essential (primary) hypertension: Secondary | ICD-10-CM | POA: Diagnosis not present

## 2022-04-04 DIAGNOSIS — R482 Apraxia: Secondary | ICD-10-CM | POA: Diagnosis not present

## 2022-04-04 DIAGNOSIS — M6281 Muscle weakness (generalized): Secondary | ICD-10-CM
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
## 2022-04-10 ENCOUNTER — Encounter: Payer: Self-pay | Admitting: Occupational Therapy

## 2022-04-10 ENCOUNTER — Ambulatory Visit: Payer: Medicare HMO | Attending: Physician Assistant | Admitting: Occupational Therapy

## 2022-04-10 DIAGNOSIS — R278 Other lack of coordination: Secondary | ICD-10-CM | POA: Insufficient documentation

## 2022-04-10 DIAGNOSIS — R2681 Unsteadiness on feet: Secondary | ICD-10-CM | POA: Insufficient documentation

## 2022-04-10 DIAGNOSIS — R482 Apraxia: Secondary | ICD-10-CM | POA: Diagnosis not present

## 2022-04-10 DIAGNOSIS — R269 Unspecified abnormalities of gait and mobility: Secondary | ICD-10-CM | POA: Insufficient documentation

## 2022-04-10 DIAGNOSIS — I6601 Occlusion and stenosis of right middle cerebral artery: Secondary | ICD-10-CM | POA: Diagnosis not present

## 2022-04-10 DIAGNOSIS — R262 Difficulty in walking, not elsewhere classified: Secondary | ICD-10-CM | POA: Diagnosis not present

## 2022-04-10 DIAGNOSIS — I63511 Cerebral infarction due to unspecified occlusion or stenosis of right middle cerebral artery: Secondary | ICD-10-CM | POA: Diagnosis not present

## 2022-04-10 DIAGNOSIS — M6281 Muscle weakness (generalized): Secondary | ICD-10-CM | POA: Insufficient documentation
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
## 2022-04-11 ENCOUNTER — Ambulatory Visit: Payer: Medicare HMO | Admitting: Physical Therapy

## 2022-04-11 ENCOUNTER — Ambulatory Visit: Payer: Medicare HMO | Admitting: Occupational Therapy

## 2022-04-11 ENCOUNTER — Encounter: Payer: Self-pay | Admitting: Occupational Therapy

## 2022-04-11 DIAGNOSIS — M6281 Muscle weakness (generalized): Secondary | ICD-10-CM

## 2022-04-11 DIAGNOSIS — R2681 Unsteadiness on feet: Secondary | ICD-10-CM | POA: Diagnosis not present

## 2022-04-11 DIAGNOSIS — I6601 Occlusion and stenosis of right middle cerebral artery: Secondary | ICD-10-CM | POA: Diagnosis not present

## 2022-04-11 DIAGNOSIS — R262 Difficulty in walking, not elsewhere classified: Secondary | ICD-10-CM

## 2022-04-11 DIAGNOSIS — R269 Unspecified abnormalities of gait and mobility: Secondary | ICD-10-CM

## 2022-04-11 DIAGNOSIS — R482 Apraxia: Secondary | ICD-10-CM | POA: Diagnosis not present

## 2022-04-11 DIAGNOSIS — R278 Other lack of coordination: Secondary | ICD-10-CM | POA: Diagnosis not present

## 2022-04-11 DIAGNOSIS — I63511 Cerebral infarction due to unspecified occlusion or stenosis of right middle cerebral artery: Secondary | ICD-10-CM | POA: Diagnosis not present
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
## 2022-04-16 ENCOUNTER — Ambulatory Visit: Payer: Medicare HMO | Admitting: Occupational Therapy

## 2022-04-16 DIAGNOSIS — R262 Difficulty in walking, not elsewhere classified: Secondary | ICD-10-CM | POA: Diagnosis not present

## 2022-04-16 DIAGNOSIS — R2681 Unsteadiness on feet: Secondary | ICD-10-CM | POA: Diagnosis not present

## 2022-04-16 DIAGNOSIS — R269 Unspecified abnormalities of gait and mobility: Secondary | ICD-10-CM | POA: Diagnosis not present

## 2022-04-16 DIAGNOSIS — M6281 Muscle weakness (generalized): Secondary | ICD-10-CM

## 2022-04-16 DIAGNOSIS — R278 Other lack of coordination: Secondary | ICD-10-CM | POA: Diagnosis not present

## 2022-04-16 DIAGNOSIS — I6601 Occlusion and stenosis of right middle cerebral artery: Secondary | ICD-10-CM | POA: Diagnosis not present

## 2022-04-16 DIAGNOSIS — R482 Apraxia: Secondary | ICD-10-CM | POA: Diagnosis not present

## 2022-04-16 DIAGNOSIS — I63511 Cerebral infarction due to unspecified occlusion or stenosis of right middle cerebral artery: Secondary | ICD-10-CM | POA: Diagnosis not present
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
## 2022-04-17 ENCOUNTER — Encounter: Payer: Self-pay | Admitting: Occupational Therapy

## 2022-04-17 ENCOUNTER — Ambulatory Visit: Payer: Medicare HMO | Admitting: Occupational Therapy

## 2022-04-17 ENCOUNTER — Ambulatory Visit: Payer: Medicare HMO

## 2022-04-17 DIAGNOSIS — I63511 Cerebral infarction due to unspecified occlusion or stenosis of right middle cerebral artery: Secondary | ICD-10-CM | POA: Diagnosis not present

## 2022-04-17 DIAGNOSIS — R269 Unspecified abnormalities of gait and mobility: Secondary | ICD-10-CM | POA: Diagnosis not present

## 2022-04-17 DIAGNOSIS — R278 Other lack of coordination: Secondary | ICD-10-CM | POA: Diagnosis not present

## 2022-04-17 DIAGNOSIS — M6281 Muscle weakness (generalized): Secondary | ICD-10-CM

## 2022-04-17 DIAGNOSIS — R2681 Unsteadiness on feet: Secondary | ICD-10-CM | POA: Diagnosis not present

## 2022-04-17 DIAGNOSIS — I6601 Occlusion and stenosis of right middle cerebral artery: Secondary | ICD-10-CM | POA: Diagnosis not present

## 2022-04-17 DIAGNOSIS — R482 Apraxia: Secondary | ICD-10-CM | POA: Diagnosis not present

## 2022-04-17 DIAGNOSIS — R262 Difficulty in walking, not elsewhere classified: Secondary | ICD-10-CM | POA: Diagnosis not present
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
## 2022-04-18 ENCOUNTER — Encounter: Payer: Medicare HMO | Admitting: Occupational Therapy

## 2022-04-18 ENCOUNTER — Ambulatory Visit: Payer: Medicare HMO | Admitting: Occupational Therapy

## 2022-04-18 ENCOUNTER — Ambulatory Visit: Payer: Medicare HMO | Admitting: Physical Therapy

## 2022-04-23 ENCOUNTER — Ambulatory Visit: Payer: Medicare HMO

## 2022-04-23 ENCOUNTER — Encounter: Payer: Self-pay | Admitting: Nurse Practitioner

## 2022-04-23 ENCOUNTER — Ambulatory Visit: Payer: Medicare HMO | Admitting: Occupational Therapy

## 2022-04-23 DIAGNOSIS — M6281 Muscle weakness (generalized): Secondary | ICD-10-CM

## 2022-04-23 DIAGNOSIS — R269 Unspecified abnormalities of gait and mobility: Secondary | ICD-10-CM

## 2022-04-23 DIAGNOSIS — R278 Other lack of coordination: Secondary | ICD-10-CM

## 2022-04-23 DIAGNOSIS — R482 Apraxia: Secondary | ICD-10-CM | POA: Diagnosis not present

## 2022-04-23 DIAGNOSIS — I63511 Cerebral infarction due to unspecified occlusion or stenosis of right middle cerebral artery: Secondary | ICD-10-CM | POA: Diagnosis not present

## 2022-04-23 DIAGNOSIS — R262 Difficulty in walking, not elsewhere classified: Secondary | ICD-10-CM | POA: Diagnosis not present

## 2022-04-23 DIAGNOSIS — I6601 Occlusion and stenosis of right middle cerebral artery: Secondary | ICD-10-CM | POA: Diagnosis not present

## 2022-04-23 DIAGNOSIS — R2681 Unsteadiness on feet: Secondary | ICD-10-CM | POA: Diagnosis not present
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
## 2022-04-24 ENCOUNTER — Encounter: Payer: Self-pay | Admitting: Occupational Therapy

## 2022-04-24 ENCOUNTER — Ambulatory Visit: Payer: Medicare HMO | Admitting: Physical Therapy

## 2022-04-24 ENCOUNTER — Ambulatory Visit: Payer: Medicare HMO | Admitting: Occupational Therapy

## 2022-04-24 DIAGNOSIS — R278 Other lack of coordination: Secondary | ICD-10-CM

## 2022-04-24 DIAGNOSIS — M6281 Muscle weakness (generalized): Secondary | ICD-10-CM | POA: Diagnosis not present

## 2022-04-24 DIAGNOSIS — R2681 Unsteadiness on feet: Secondary | ICD-10-CM | POA: Diagnosis not present

## 2022-04-24 DIAGNOSIS — R262 Difficulty in walking, not elsewhere classified: Secondary | ICD-10-CM | POA: Diagnosis not present

## 2022-04-24 DIAGNOSIS — R269 Unspecified abnormalities of gait and mobility: Secondary | ICD-10-CM | POA: Diagnosis not present

## 2022-04-24 DIAGNOSIS — R482 Apraxia: Secondary | ICD-10-CM | POA: Diagnosis not present

## 2022-04-24 DIAGNOSIS — I63511 Cerebral infarction due to unspecified occlusion or stenosis of right middle cerebral artery: Secondary | ICD-10-CM | POA: Diagnosis not present

## 2022-04-24 DIAGNOSIS — I6601 Occlusion and stenosis of right middle cerebral artery: Secondary | ICD-10-CM | POA: Diagnosis not present
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
## 2022-04-25 ENCOUNTER — Encounter: Payer: Self-pay | Admitting: Occupational Therapy

## 2022-04-25 ENCOUNTER — Ambulatory Visit: Payer: Medicare HMO | Admitting: Occupational Therapy

## 2022-04-25 DIAGNOSIS — R278 Other lack of coordination: Secondary | ICD-10-CM | POA: Diagnosis not present

## 2022-04-25 DIAGNOSIS — I63511 Cerebral infarction due to unspecified occlusion or stenosis of right middle cerebral artery: Secondary | ICD-10-CM | POA: Diagnosis not present

## 2022-04-25 DIAGNOSIS — I6601 Occlusion and stenosis of right middle cerebral artery: Secondary | ICD-10-CM | POA: Diagnosis not present

## 2022-04-25 DIAGNOSIS — R482 Apraxia: Secondary | ICD-10-CM | POA: Diagnosis not present

## 2022-04-25 DIAGNOSIS — M6281 Muscle weakness (generalized): Secondary | ICD-10-CM

## 2022-04-25 DIAGNOSIS — R269 Unspecified abnormalities of gait and mobility: Secondary | ICD-10-CM | POA: Diagnosis not present

## 2022-04-25 DIAGNOSIS — R262 Difficulty in walking, not elsewhere classified: Secondary | ICD-10-CM | POA: Diagnosis not present

## 2022-04-25 DIAGNOSIS — R2681 Unsteadiness on feet: Secondary | ICD-10-CM | POA: Diagnosis not present
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
## 2022-04-30 ENCOUNTER — Encounter: Payer: Self-pay | Admitting: Occupational Therapy

## 2022-04-30 ENCOUNTER — Ambulatory Visit: Payer: Medicare HMO | Admitting: Physical Therapy

## 2022-04-30 ENCOUNTER — Ambulatory Visit: Payer: Medicare HMO | Admitting: Occupational Therapy

## 2022-04-30 DIAGNOSIS — R482 Apraxia: Secondary | ICD-10-CM | POA: Diagnosis not present

## 2022-04-30 DIAGNOSIS — I63511 Cerebral infarction due to unspecified occlusion or stenosis of right middle cerebral artery: Secondary | ICD-10-CM | POA: Diagnosis not present

## 2022-04-30 DIAGNOSIS — M6281 Muscle weakness (generalized): Secondary | ICD-10-CM

## 2022-04-30 DIAGNOSIS — I6601 Occlusion and stenosis of right middle cerebral artery: Secondary | ICD-10-CM | POA: Diagnosis not present

## 2022-04-30 DIAGNOSIS — R278 Other lack of coordination: Secondary | ICD-10-CM | POA: Diagnosis not present

## 2022-04-30 DIAGNOSIS — R2681 Unsteadiness on feet: Secondary | ICD-10-CM | POA: Diagnosis not present

## 2022-04-30 DIAGNOSIS — R269 Unspecified abnormalities of gait and mobility: Secondary | ICD-10-CM | POA: Diagnosis not present

## 2022-04-30 DIAGNOSIS — R262 Difficulty in walking, not elsewhere classified: Secondary | ICD-10-CM | POA: Diagnosis not present
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
## 2022-05-01 ENCOUNTER — Ambulatory Visit: Payer: Medicare HMO

## 2022-05-01 ENCOUNTER — Ambulatory Visit: Payer: Medicare HMO | Admitting: Occupational Therapy

## 2022-05-01 DIAGNOSIS — R278 Other lack of coordination: Secondary | ICD-10-CM | POA: Diagnosis not present

## 2022-05-01 DIAGNOSIS — R2681 Unsteadiness on feet: Secondary | ICD-10-CM

## 2022-05-01 DIAGNOSIS — R269 Unspecified abnormalities of gait and mobility: Secondary | ICD-10-CM | POA: Diagnosis not present

## 2022-05-01 DIAGNOSIS — R482 Apraxia: Secondary | ICD-10-CM | POA: Diagnosis not present

## 2022-05-01 DIAGNOSIS — M6281 Muscle weakness (generalized): Secondary | ICD-10-CM | POA: Diagnosis not present

## 2022-05-01 DIAGNOSIS — I6601 Occlusion and stenosis of right middle cerebral artery: Secondary | ICD-10-CM | POA: Diagnosis not present

## 2022-05-01 DIAGNOSIS — R262 Difficulty in walking, not elsewhere classified: Secondary | ICD-10-CM | POA: Diagnosis not present

## 2022-05-01 DIAGNOSIS — I63511 Cerebral infarction due to unspecified occlusion or stenosis of right middle cerebral artery: Secondary | ICD-10-CM
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
## 2022-05-02 ENCOUNTER — Ambulatory Visit: Payer: Medicare HMO | Admitting: Occupational Therapy

## 2022-05-07 ENCOUNTER — Ambulatory Visit: Payer: Medicare HMO | Attending: Physician Assistant | Admitting: Occupational Therapy

## 2022-05-07 ENCOUNTER — Encounter: Payer: Self-pay | Admitting: Nurse Practitioner

## 2022-05-07 ENCOUNTER — Ambulatory Visit: Payer: Medicare HMO | Admitting: Physical Therapy

## 2022-05-07 ENCOUNTER — Ambulatory Visit (INDEPENDENT_AMBULATORY_CARE_PROVIDER_SITE_OTHER): Payer: Medicare HMO | Admitting: Nurse Practitioner

## 2022-05-07 ENCOUNTER — Encounter: Payer: Self-pay | Admitting: Occupational Therapy

## 2022-05-07 VITALS — BP 149/87 | HR 63 | Temp 97.8°F | Resp 16 | Ht 70.0 in | Wt 173.2 lb

## 2022-05-07 DIAGNOSIS — Z0001 Encounter for general adult medical examination with abnormal findings: Secondary | ICD-10-CM

## 2022-05-07 DIAGNOSIS — I6930 Unspecified sequelae of cerebral infarction: Secondary | ICD-10-CM

## 2022-05-07 DIAGNOSIS — M6281 Muscle weakness (generalized): Secondary | ICD-10-CM | POA: Insufficient documentation

## 2022-05-07 DIAGNOSIS — R269 Unspecified abnormalities of gait and mobility: Secondary | ICD-10-CM | POA: Diagnosis not present

## 2022-05-07 DIAGNOSIS — L299 Pruritus, unspecified: Secondary | ICD-10-CM | POA: Diagnosis not present

## 2022-05-07 DIAGNOSIS — R278 Other lack of coordination: Secondary | ICD-10-CM | POA: Diagnosis not present

## 2022-05-07 DIAGNOSIS — I10 Essential (primary) hypertension: Secondary | ICD-10-CM

## 2022-05-07 DIAGNOSIS — R262 Difficulty in walking, not elsewhere classified: Secondary | ICD-10-CM | POA: Diagnosis not present

## 2022-05-07 DIAGNOSIS — R6889 Other general symptoms and signs: Secondary | ICD-10-CM

## 2022-05-07 DIAGNOSIS — R300 Dysuria: Secondary | ICD-10-CM | POA: Diagnosis not present

## 2022-05-07 DIAGNOSIS — I69354 Hemiplegia and hemiparesis following cerebral infarction affecting left non-dominant side: Secondary | ICD-10-CM

## 2022-05-07 DIAGNOSIS — R2681 Unsteadiness on feet: Secondary | ICD-10-CM | POA: Diagnosis not present

## 2022-05-07 DIAGNOSIS — K117 Disturbances of salivary secretion: Secondary | ICD-10-CM | POA: Diagnosis not present
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
## 2022-05-08 ENCOUNTER — Ambulatory Visit: Payer: Medicare HMO

## 2022-05-08 ENCOUNTER — Ambulatory Visit: Payer: Medicare HMO | Admitting: Occupational Therapy

## 2022-05-08 DIAGNOSIS — R278 Other lack of coordination: Secondary | ICD-10-CM | POA: Diagnosis not present

## 2022-05-08 DIAGNOSIS — R262 Difficulty in walking, not elsewhere classified: Secondary | ICD-10-CM | POA: Diagnosis not present

## 2022-05-08 DIAGNOSIS — R269 Unspecified abnormalities of gait and mobility: Secondary | ICD-10-CM | POA: Diagnosis not present

## 2022-05-08 DIAGNOSIS — M6281 Muscle weakness (generalized): Secondary | ICD-10-CM

## 2022-05-08 DIAGNOSIS — R2681 Unsteadiness on feet: Secondary | ICD-10-CM

## 2022-05-08 LAB — UA/M W/RFLX CULTURE, ROUTINE
Bilirubin, UA: NEGATIVE
Glucose, UA: NEGATIVE
Ketones, UA: NEGATIVE
Leukocytes,UA: NEGATIVE
Nitrite, UA: NEGATIVE
Protein,UA: NEGATIVE
RBC, UA: NEGATIVE
Specific Gravity, UA: 1.014 (ref 1.005–1.030)
Urobilinogen, Ur: 0.2 mg/dL (ref 0.2–1.0)
pH, UA: 7.5 (ref 5.0–7.5)

## 2022-05-08 LAB — MICROSCOPIC EXAMINATION
Bacteria, UA: NONE SEEN
Casts: NONE SEEN /lpf
Epithelial Cells (non renal): NONE SEEN /hpf (ref 0–10)
RBC, Urine: NONE SEEN /hpf (ref 0–2)
WBC, UA: NONE SEEN /hpf (ref 0–5)
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
## 2022-05-09 ENCOUNTER — Encounter: Payer: Self-pay | Admitting: Occupational Therapy

## 2022-05-09 ENCOUNTER — Ambulatory Visit: Payer: Medicare HMO | Admitting: Occupational Therapy

## 2022-05-09 ENCOUNTER — Ambulatory Visit: Payer: Medicare HMO | Admitting: Physical Therapy

## 2022-05-09 DIAGNOSIS — R262 Difficulty in walking, not elsewhere classified: Secondary | ICD-10-CM | POA: Diagnosis not present

## 2022-05-09 DIAGNOSIS — R278 Other lack of coordination: Secondary | ICD-10-CM | POA: Diagnosis not present

## 2022-05-09 DIAGNOSIS — M6281 Muscle weakness (generalized): Secondary | ICD-10-CM | POA: Diagnosis not present

## 2022-05-09 DIAGNOSIS — R2681 Unsteadiness on feet: Secondary | ICD-10-CM | POA: Diagnosis not present

## 2022-05-09 DIAGNOSIS — R269 Unspecified abnormalities of gait and mobility: Secondary | ICD-10-CM | POA: Diagnosis not present
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
## 2022-05-14 ENCOUNTER — Ambulatory Visit: Payer: Medicare HMO | Admitting: Physical Therapy

## 2022-05-14 ENCOUNTER — Ambulatory Visit: Payer: Medicare HMO | Admitting: Occupational Therapy

## 2022-05-14 ENCOUNTER — Encounter: Payer: Self-pay | Admitting: Occupational Therapy

## 2022-05-14 DIAGNOSIS — R278 Other lack of coordination: Secondary | ICD-10-CM | POA: Diagnosis not present

## 2022-05-14 DIAGNOSIS — R2681 Unsteadiness on feet: Secondary | ICD-10-CM

## 2022-05-14 DIAGNOSIS — R262 Difficulty in walking, not elsewhere classified: Secondary | ICD-10-CM

## 2022-05-14 DIAGNOSIS — M6281 Muscle weakness (generalized): Secondary | ICD-10-CM

## 2022-05-14 DIAGNOSIS — R269 Unspecified abnormalities of gait and mobility: Secondary | ICD-10-CM
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
## 2022-05-15 ENCOUNTER — Encounter: Payer: Self-pay | Admitting: Occupational Therapy

## 2022-05-15 ENCOUNTER — Ambulatory Visit: Payer: Medicare HMO | Admitting: Occupational Therapy

## 2022-05-15 DIAGNOSIS — M6281 Muscle weakness (generalized): Secondary | ICD-10-CM | POA: Diagnosis not present

## 2022-05-15 DIAGNOSIS — R278 Other lack of coordination: Secondary | ICD-10-CM

## 2022-05-15 DIAGNOSIS — R269 Unspecified abnormalities of gait and mobility: Secondary | ICD-10-CM | POA: Diagnosis not present

## 2022-05-15 DIAGNOSIS — R262 Difficulty in walking, not elsewhere classified: Secondary | ICD-10-CM | POA: Diagnosis not present

## 2022-05-15 DIAGNOSIS — R2681 Unsteadiness on feet: Secondary | ICD-10-CM | POA: Diagnosis not present
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
## 2022-05-16 ENCOUNTER — Ambulatory Visit: Payer: Medicare HMO | Admitting: Occupational Therapy

## 2022-05-16 ENCOUNTER — Ambulatory Visit: Payer: Medicare HMO | Admitting: Physical Therapy

## 2022-05-16 DIAGNOSIS — R278 Other lack of coordination: Secondary | ICD-10-CM | POA: Diagnosis not present

## 2022-05-16 DIAGNOSIS — R269 Unspecified abnormalities of gait and mobility: Secondary | ICD-10-CM | POA: Diagnosis not present

## 2022-05-16 DIAGNOSIS — M6281 Muscle weakness (generalized): Secondary | ICD-10-CM

## 2022-05-16 DIAGNOSIS — R262 Difficulty in walking, not elsewhere classified: Secondary | ICD-10-CM | POA: Diagnosis not present

## 2022-05-16 DIAGNOSIS — R2681 Unsteadiness on feet: Secondary | ICD-10-CM | POA: Diagnosis not present
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
## 2022-05-21 ENCOUNTER — Encounter: Payer: Self-pay | Admitting: Physical Therapy

## 2022-05-21 ENCOUNTER — Ambulatory Visit: Payer: Medicare HMO | Admitting: Physical Therapy

## 2022-05-21 ENCOUNTER — Encounter: Payer: Self-pay | Admitting: Occupational Therapy

## 2022-05-21 ENCOUNTER — Ambulatory Visit: Payer: Medicare HMO | Admitting: Occupational Therapy

## 2022-05-21 DIAGNOSIS — M6281 Muscle weakness (generalized): Secondary | ICD-10-CM

## 2022-05-21 DIAGNOSIS — R262 Difficulty in walking, not elsewhere classified: Secondary | ICD-10-CM | POA: Diagnosis not present

## 2022-05-21 DIAGNOSIS — R269 Unspecified abnormalities of gait and mobility: Secondary | ICD-10-CM

## 2022-05-21 DIAGNOSIS — R2681 Unsteadiness on feet: Secondary | ICD-10-CM | POA: Diagnosis not present

## 2022-05-21 DIAGNOSIS — R278 Other lack of coordination: Secondary | ICD-10-CM | POA: Diagnosis not present
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
## 2022-05-22 ENCOUNTER — Ambulatory Visit: Payer: Medicare HMO | Admitting: Occupational Therapy

## 2022-05-22 DIAGNOSIS — R269 Unspecified abnormalities of gait and mobility: Secondary | ICD-10-CM | POA: Diagnosis not present

## 2022-05-22 DIAGNOSIS — R2681 Unsteadiness on feet: Secondary | ICD-10-CM | POA: Diagnosis not present

## 2022-05-22 DIAGNOSIS — M6281 Muscle weakness (generalized): Secondary | ICD-10-CM | POA: Diagnosis not present

## 2022-05-22 DIAGNOSIS — R278 Other lack of coordination: Secondary | ICD-10-CM | POA: Diagnosis not present

## 2022-05-22 DIAGNOSIS — R262 Difficulty in walking, not elsewhere classified: Secondary | ICD-10-CM | POA: Diagnosis not present
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
## 2022-05-23 ENCOUNTER — Ambulatory Visit: Payer: Medicare HMO | Admitting: Physical Therapy

## 2022-05-23 ENCOUNTER — Ambulatory Visit: Payer: Medicare HMO | Admitting: Occupational Therapy

## 2022-05-23 ENCOUNTER — Encounter: Payer: Self-pay | Admitting: Nurse Practitioner

## 2022-05-23 ENCOUNTER — Encounter: Payer: Self-pay | Admitting: Occupational Therapy

## 2022-05-23 DIAGNOSIS — M6281 Muscle weakness (generalized): Secondary | ICD-10-CM

## 2022-05-23 DIAGNOSIS — R278 Other lack of coordination: Secondary | ICD-10-CM | POA: Diagnosis not present

## 2022-05-23 DIAGNOSIS — R262 Difficulty in walking, not elsewhere classified: Secondary | ICD-10-CM | POA: Diagnosis not present

## 2022-05-23 DIAGNOSIS — R269 Unspecified abnormalities of gait and mobility: Secondary | ICD-10-CM | POA: Diagnosis not present

## 2022-05-23 DIAGNOSIS — R2681 Unsteadiness on feet: Secondary | ICD-10-CM | POA: Diagnosis not present
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
## 2022-05-28 ENCOUNTER — Encounter: Payer: Self-pay | Admitting: Physical Therapy

## 2022-05-28 ENCOUNTER — Encounter: Payer: Self-pay | Admitting: Occupational Therapy

## 2022-05-28 ENCOUNTER — Ambulatory Visit: Payer: Medicare HMO | Admitting: Occupational Therapy

## 2022-05-28 ENCOUNTER — Ambulatory Visit: Payer: Medicare HMO | Admitting: Physical Therapy

## 2022-05-28 DIAGNOSIS — R262 Difficulty in walking, not elsewhere classified: Secondary | ICD-10-CM | POA: Diagnosis not present

## 2022-05-28 DIAGNOSIS — R2681 Unsteadiness on feet: Secondary | ICD-10-CM

## 2022-05-28 DIAGNOSIS — M6281 Muscle weakness (generalized): Secondary | ICD-10-CM | POA: Diagnosis not present

## 2022-05-28 DIAGNOSIS — R278 Other lack of coordination: Secondary | ICD-10-CM | POA: Diagnosis not present

## 2022-05-28 DIAGNOSIS — R269 Unspecified abnormalities of gait and mobility: Secondary | ICD-10-CM
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
## 2022-05-29 ENCOUNTER — Ambulatory Visit: Payer: Medicare HMO | Admitting: Occupational Therapy

## 2022-05-29 DIAGNOSIS — M6281 Muscle weakness (generalized): Secondary | ICD-10-CM | POA: Diagnosis not present

## 2022-05-29 DIAGNOSIS — R269 Unspecified abnormalities of gait and mobility: Secondary | ICD-10-CM | POA: Diagnosis not present

## 2022-05-29 DIAGNOSIS — R278 Other lack of coordination: Secondary | ICD-10-CM | POA: Diagnosis not present

## 2022-05-29 DIAGNOSIS — R262 Difficulty in walking, not elsewhere classified: Secondary | ICD-10-CM | POA: Diagnosis not present

## 2022-05-29 DIAGNOSIS — R2681 Unsteadiness on feet: Secondary | ICD-10-CM | POA: Diagnosis not present
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
## 2022-05-30 ENCOUNTER — Ambulatory Visit: Payer: Medicare HMO | Admitting: Occupational Therapy

## 2022-05-30 ENCOUNTER — Ambulatory Visit: Payer: Medicare HMO | Admitting: Physical Therapy

## 2022-05-30 DIAGNOSIS — M6281 Muscle weakness (generalized): Secondary | ICD-10-CM

## 2022-05-30 DIAGNOSIS — R262 Difficulty in walking, not elsewhere classified: Secondary | ICD-10-CM | POA: Diagnosis not present

## 2022-05-30 DIAGNOSIS — R269 Unspecified abnormalities of gait and mobility: Secondary | ICD-10-CM | POA: Diagnosis not present

## 2022-05-30 DIAGNOSIS — R278 Other lack of coordination: Secondary | ICD-10-CM | POA: Diagnosis not present

## 2022-05-30 DIAGNOSIS — R2681 Unsteadiness on feet: Secondary | ICD-10-CM | POA: Diagnosis not present
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
## 2022-06-04 ENCOUNTER — Ambulatory Visit: Payer: Medicare HMO | Admitting: Physical Therapy

## 2022-06-04 ENCOUNTER — Encounter: Payer: Self-pay | Admitting: Physical Therapy

## 2022-06-04 ENCOUNTER — Ambulatory Visit: Payer: Medicare HMO | Admitting: Occupational Therapy

## 2022-06-04 DIAGNOSIS — R269 Unspecified abnormalities of gait and mobility: Secondary | ICD-10-CM

## 2022-06-04 DIAGNOSIS — R262 Difficulty in walking, not elsewhere classified: Secondary | ICD-10-CM

## 2022-06-04 DIAGNOSIS — R2681 Unsteadiness on feet: Secondary | ICD-10-CM | POA: Diagnosis not present

## 2022-06-04 DIAGNOSIS — M6281 Muscle weakness (generalized): Secondary | ICD-10-CM

## 2022-06-04 DIAGNOSIS — R278 Other lack of coordination: Secondary | ICD-10-CM | POA: Diagnosis not present
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
## 2022-06-05 ENCOUNTER — Ambulatory Visit: Payer: Medicare HMO | Admitting: Occupational Therapy

## 2022-06-05 DIAGNOSIS — R2681 Unsteadiness on feet: Secondary | ICD-10-CM | POA: Diagnosis not present

## 2022-06-05 DIAGNOSIS — R262 Difficulty in walking, not elsewhere classified: Secondary | ICD-10-CM | POA: Diagnosis not present

## 2022-06-05 DIAGNOSIS — R269 Unspecified abnormalities of gait and mobility: Secondary | ICD-10-CM | POA: Diagnosis not present

## 2022-06-05 DIAGNOSIS — M6281 Muscle weakness (generalized): Secondary | ICD-10-CM

## 2022-06-05 DIAGNOSIS — R278 Other lack of coordination: Secondary | ICD-10-CM | POA: Diagnosis not present
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
## 2022-06-06 ENCOUNTER — Encounter: Payer: Self-pay | Admitting: Occupational Therapy

## 2022-06-06 ENCOUNTER — Ambulatory Visit: Payer: Medicare HMO | Admitting: Occupational Therapy

## 2022-06-06 ENCOUNTER — Ambulatory Visit: Payer: Medicare HMO | Admitting: Physical Therapy

## 2022-06-06 DIAGNOSIS — R262 Difficulty in walking, not elsewhere classified: Secondary | ICD-10-CM | POA: Diagnosis not present

## 2022-06-06 DIAGNOSIS — M6281 Muscle weakness (generalized): Secondary | ICD-10-CM

## 2022-06-06 DIAGNOSIS — R2681 Unsteadiness on feet: Secondary | ICD-10-CM | POA: Diagnosis not present

## 2022-06-06 DIAGNOSIS — R278 Other lack of coordination: Secondary | ICD-10-CM | POA: Diagnosis not present

## 2022-06-06 DIAGNOSIS — R269 Unspecified abnormalities of gait and mobility: Secondary | ICD-10-CM | POA: Diagnosis not present

## 2022-06-06 NOTE — Therapy (Addendum)
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 secondary to increased tightness, and pain in the scapular region in sitting, and sidelying. Pt. tolerated soft tissue mobilizations with metacarpal spread stretches for the left hand in preparation for ROM, and engagement of functional use. Manual Therapy was performed independent of, and in preparation for ROM, and there ex. joint mobilizations for shoulder flexion, and abduction to prepare for ROM.    EStim:   Pt. tolerated Estim frequency:, duty cycle: 50% cycle time: 10/10. Intensity 26 for . Pt. worked on holding  wrist extension through the up, and down ramp cycle, and flexing digits during the off cycle.    Pt. continues to improve with left shoulder AAROM. Pt. is improving with  engaging  his hand during more daily ADL, and IADL tasks. Pt. reports that he has been trying to engage his left hand for gripping, and thumb flexion. Pt. is now using his left hand to initiate scratching an itch on the RUE, and is now engaging his LUE more for washing his right side. Pt. reports that he uses his right hand only to hold the ladder when getting onto the boat with someone behind him. Pt. continues to work on increasing shoulder left with erasing vertical lines of progressively increasing heights. Pt. continues to engage his left hand consistently during ADLs, and IADL tasks. Pt. presents with initiating lateral grasp motion, however has difficulty formulating a lateral grasp to grasp small items. Pt. continues to respond very well to moist heat, manual therapy, and there. Ex. Pt. continues to require cues for form, and technique with right triceps strengthening with the [employer]. Pt. was able to perform increased triceps reps with increased weight  on the [employer]. Pt. continues to present with increased flexor tone, and tightness proximally in the scapular region initially, and LUE today. Pt. presented with less tightness in the right side of the trunk during trunk elongation stretches. Pt. Presented increased  tightness in the left forearm pronators limiting supination during rowing today. Pt. requires assist, and cues for gross digit gripping, and extension when releasing objects. Pt. tolerated scapula mobilizations, and trunk elongation stretches. Pt. continues to respond well to manual therapy for his scapular movements gliding more freely following therapy. Pt. Continues to use his left hand to pull the doorknob while closing the door. Pt. continues to consistently engage, and use his left hand more during daily ADL/IADL tasks including: washing his hair, scratching an itch, holding bottle, holding sticks during yard cleanup, and opening a screen door. Pt. requires fewer cues to avoid  compensation proximally with hiking in the left shoulder, and leaning into the movement with his trunk when reaching. Pt. continues to present with limited left thumb motion making it difficult to hold objects. Pt. continues to progress with AROM in the LUE. Pt. continues to require cues for left sided awareness, and for motor planning through movements on the left. Pt. continues to present with increased wrist extension consistently, as well as consistent MP, PIP, and DIP extension. Pt. continues to work on normalizing tone, and facilitating consistent active movement in order to work towards improving engagement of the left upper extremity during ADLs and IADL tasks.         PATIENT EDUCATION: Education details:  LUE functioning, trunk elongation stretch options at home, scapular retraction with red theraband. Person educated: Patient Education method: Explanation Education comprehension: verbalized understanding, returned demonstration, verbal cues required, tactile cues required, and needs further education   HOME EXERCISE PROGRAM Continue ongoing HEPs for the LUE, Scapular retraction with red theraband  MEASUREMENTS:  Left shoulder in supine Flexion:  118(125) Abduction: 83(93) Wrist extension: 28(40)   OT Short Term Goals - 03/20/22 1206       OT SHORT TERM GOAL #1   Title Pt. will improve edema by 1 cm in the left wrist, and MCPs to prepare for ROM    Baseline 40th: 18 cm at wrist, MCPs 20.5 cm 30th visit: Edema in improving. 8/3/0/2022: Left wrist 19cm, MCPs 21 cm. 05/24/2021: Edema is improving. Eval: Left wrist 19cm, MCPs 22 cm    Time 6    Period Weeks    Status Achieved    Target Date 07/17/21              OT Long Term Goals - 03/26/22 1529       OT LONG TERM GOAL #1   Title Pt. will improve FOTO score by 3 points to demostrate clinically significant changes.    Baseline 03/20/22: FOTO 45. 01/30/2022: 48 01/09/2022: 44 12/18/2021: TBD 11/21/2021: 47 11/01/2023: FOTO:  42, FOTO score: 44 60th visit: FOTO score 47. 50th visit: FOTO score: 47 TR score: 56 40th: 41. 30th visit: FOTO: 44 Eval: FOTO score 43 130th visit: FOTO score 50.    Time 12    Period Weeks    Status  Achieved   Target Date 06/18/22      OT LONG TERM GOAL #2   Title Pt. will improve left shoulder flexion by 10 degrees to assist with UE dressing.    Baseline 05/14/2022: 161(096) 03/20/22: 105 (120). 02/16/2022:122(138)  01/31/2022: 045(409) 01/09/2022: 811(914) Pt. is improving with UE dressing, however requires assist  identifying when T shirts are inside out or backwards. 11/21/2021: Left shoulder flexion 125(133) Shoulder [phone]). Pt. continues to present with limited left shoulder flexion, however has improved with UE dressing. 60th: left shoulder flexion 111(118) 50th: 108 (108) Pt. is improving with consistency in donning a jacket. 40th: 85 (100). 30th visit: 83(105), 06/05/2021: Left shoulder flexion 82(105) 05/24/2021: Left shoulder ROM continues to be limited. 10th visit: Limited left shoulder ROM Eval: R: 96(134), Left 82(92)    Time 12    Period Weeks    Status On-going    Target Date 06/18/22      OT LONG TERM GOAL #3   Title Pt. will improve active left digit grasp to be able to hold, and hike his pants independently.    Baseline 05/14/2022: Pt. Is able to hook his left digits on the belt loop to assist with hiking pants. Pt. Is unable to grasp pants. 03/20/22: pt can hook fingers on pocket to pull, diffiulty grasping (L grip 0#).  02/26/2022: Pt. is improving with grasp patterns, however continues to have difficulty hiking pants with his left hand.01/31/2022: Pt. is starting to formulate a grasp pattern. Improving with digit fexion to [facility]. 01/10/2022: Pt. uses his left hand to assist with carrying items, and attempts to engage in hiking clothing, however has difficulty securely holding his pants to hike them on the left.  Pt. 12/18/2021: Pt. continues to make progress with fisting, however  continues to present with tightness, and difficulty hiking clothing. 11/21/2021: Pt. is improving with left digit flexion to the [facility], however has difficulty hiking pants. Pt.continues to  improving with digit flexion, however has difficulty grasping and hiking his pants. 60th: Pt. is improving with digit flexion, however, is unable to hold pants while hiking them up. 50th: Pt. continues to consistently activate, and initiate digit flexion. Pt. is unable to hold, or hike pants. 40th: consistently activates digit flexion to grasp dynamometer (0 lb).  30th visit: Pt. continues to be able to consistently initate digit flexion in preparation for initiating active functional grasping. 06/05/2021: Pt. is consistently starting to initiate active left digit flexion in preparation for initiaing functional grasping. 05/24/2021: Pt. is intermiitently initiating gross grasping. 10th visit: Pt. presents with limited active grasp. Eval: No active left digit flexion. pt. has difficulty hikig pants    Time 12    Period Weeks    Status On-going    Target Date 06/18/22      OT LONG TERM GOAL #5   Title Pt. will initiate active digit extension in preparation for releasing objects from his hand.    Baseline 05/14/2022:  Pt. Removed 4 pegs from the 9 Hole Peg Test in 2 min. And 30 sec. 03/20/22: Improving - removed pegs from 9 hole PEG test in 1 min 3 sec. 02/26/2022: Pt. continues to worke don improving left hand digit extension for actively releasing objects. 01/31/2022: Pt. is improving with digit extension, however has difficulty consistently releasing objects from his hand. 01/09/2022: Pt. continues to improve with digit extension, however continues to have difficulty releasing objects. 12/18/2021: Pt. is improving with digit extension, however has difficulty releasing objects.12/01/2021: Pt. is improving with digit extension, however is having difficulty releasing objects from his left hand. Pt. continues to improve with gross digit  extension, and releasing objects from his hand. 60th visit: Pt. is improving wit digit extension in preparation for releasing objest from his hand. 50th visit: Pt. is consistentyl improving active digit extension for releasing objects. 40th: 3rd/4th digit  active extension greater than 1st, 2nd, and 5th. 30th visit: Pt. is consisitently initiating active left digit extension. 06/05/2021: Pt. is consistently iniating active left digit extension, however is unable to actively release objects from his hand.05/24/2021: Pt. is consistently initiating active digit extensors. 10th visit: Pt. is intermittently initiating active digit extension. Eval: No active digit extension facilitated. pt. is unable to actively release objects with her left hand.    Time 12    Period Weeks    Status On-going    Target Date 06/18/22      OT LONG TERM GOAL #6   Title Pt. will demonstrate use of visual compensatory strategies 100% of the time when navigating through his environments, and working on tabletop tasks.    Baseline 05/14/2022: Pt. Is using visual compensatory stragies. Pt. Bumps into items of the left in his kitchen. 03/20/22: does well in open spaces, difficulty in tight kitchen and while dual tasking. 02/26/2022;Pt. continues to require cues for left sided awareness. Pt. tends to bump his LUE into his kitchen table at home. 01/31/2022: pt. continues to have limited awareness of the left UE, and tends to bump into objects. 01/09/2022: Pt. continues to present with limited left sided awareness, requiring cues for left sided weakness. 60th visit: Pt presents with limited awareness of the LUE. 50th visit: Pt. prepsents with degreased awarenes  of the LUE.  40th: utilizes strategies in home, continues to have difficulty using strategies in community. 30th visit: Pt. conitnues to utilize compensatory strategies, however accassionally misses items on the left. 06/05/2021: Pt. continues to utilize visual  compensatory stratgeties,  however occassionally misses items on the left. . 05/24/2021: pt. continues to utilize visual compensatory strategies  when maneuvering through his environment. 10th visit: Pt. is progressing with visual compensatory strategies when moving through his environment. Eval: Pt. is limited    Time 12    Period Weeks    Status On-going    Target Date 06/18/22      OT LONG TERM GOAL #7   Title Pt. will improve left wrist extension by 10 degrees in preparation for initiating functional reaching for objects.    Baseline 05/14/2022: 28 (40) 03/20/22: 10 (20) 02/26/2022:17(45) 01/31/2022: 17(45) 01/09/2022: left wrist extension 5(45)    Time 12    Period Weeks    Status On-going    Target Date 06/18/22              Plan - 03/27/22 1810     Clinical Impression Statement Pt. continues to improve with left shoulder AAROM. Pt. Is improving with engaging  his hand during more daily ADL, and IADL tasks. Pt. reports that he has been trying to engage his left hand for gripping, and thumb flexion. Pt. is now using his left hand to initiate scratching an itch on the RUE, and is now engaging his LUE more for washing his right side. Pt. Reports that he [place] Pt. reports that he uses his right hand only to hold the ladder when getting onto the boat with someone behind him.  Pt. continues to require less time to thoroughly erase a large vertical board with his left hand. Pt. had less difficulty reaching up high to erase the very top of the image on the white board today. Pt. continues to work on increasing shoulder left with erasing vertical lines of progressively increasing heights. Pt. continues to engage his left hand consistently during ADLs, and IADL  tasks. Pt. presents with initiating lateral grasp motion, however has difficulty formulating a lateral grasp to grasp small items. Pt. continues to respond very well to moist heat, manual therapy, and there. Ex. Pt.  continues to require cues for form, and technique with right triceps strengthening with the [employer]. Pt. was able to perform increased triceps reps with increased weight on the [employer]. Pt. continues to present with increased flexor tone, and tightness proximally in the scapular region initially, and LUE today. Pt. presented with less tightness in the right side of the trunk during trunk elongation stretches. Pt. presented with less tightness in the left forearm pronators limiting supination during rowing today. Pt. requires assist, and cues for gross digit gripping, and extension when releasing objects. Pt. tolerated scapula mobilizations, and trunk elongation stretches. Pt. continues to respond well to manual therapy for his scapular movements gliding more freely following therapy. Pt. Continues to use his left hand to pull the doorknob while closing the door. Pt. continues to consistently engage, and use his left hand more during daily ADL/IADL tasks including: washing his hair, scratching an itch, holding bottle, holding sticks during yard cleanup, and opening a screen door. Pt. requires fewer cues to avoid compensation proximally with hiking in the left shoulder, and leaning into the movement with his trunk when reaching. Pt. continues to present with limited left thumb motion making it difficult to hold objects. Pt. continues to progress with AROM in the LUE. Pt. continues to require cues for left sided awareness, and for motor planning through movements on the left. Pt. continues to present with increased wrist extension consistently, as well as consistent MP, PIP, and DIP extension. Pt. continues to work on normalizing tone, and facilitating consistent active movement in order to work towards improving engagement of the left upper extremity during ADLs and IADL tasks.           OT Occupational Profile and History Detailed Assessment- Review of Records and additional review of physical, cognitive,  psychosocial history related to current functional performance    Occupational performance deficits (Please refer to evaluation for details): ADL's;IADL's    Body Structure / Function / Physical Skills ADL;Coordination;Endurance;GMC;UE functional use;Balance;Sensation;Body mechanics;Flexibility;IADL;Pain;Dexterity;FMC;Proprioception;Strength;Edema;Mobility;ROM;Tone    Rehab Potential Good    Clinical Decision Making Several treatment options, min-mod task modification necessary    Comorbidities Affecting Occupational Performance: Presence of comorbidities impacting occupational performance    Modification or Assistance to Complete Evaluation  Min-Moderate modification of tasks or assist with assess necessary to complete eval    OT Frequency 3x / week    OT Duration 12 weeks    OT Treatment/Interventions Self-care/ADL training;Psychosocial skills training;Neuromuscular education;Patient/family education;Energy conservation;Therapeutic exercise;DME and/or AE instruction;Therapeutic activities    Consulted and Agree with Plan of Care Family member/caregiver;Patient            Olegario MessierElaine Khiry Pasquariello, MS, OTR/L   Olegario Messierlaine Alegria Dominique, OT 06/06/2022, 10:28 AM

## 2022-06-11 ENCOUNTER — Ambulatory Visit: Payer: Medicare HMO | Admitting: Physical Therapy

## 2022-06-11 ENCOUNTER — Ambulatory Visit: Payer: Medicare HMO | Attending: Physician Assistant | Admitting: Occupational Therapy

## 2022-06-11 ENCOUNTER — Encounter: Payer: Self-pay | Admitting: Physical Therapy

## 2022-06-11 ENCOUNTER — Encounter: Payer: Self-pay | Admitting: Occupational Therapy

## 2022-06-11 DIAGNOSIS — R482 Apraxia: Secondary | ICD-10-CM | POA: Diagnosis not present

## 2022-06-11 DIAGNOSIS — M6281 Muscle weakness (generalized): Secondary | ICD-10-CM | POA: Diagnosis not present

## 2022-06-11 DIAGNOSIS — R269 Unspecified abnormalities of gait and mobility: Secondary | ICD-10-CM

## 2022-06-11 DIAGNOSIS — I63511 Cerebral infarction due to unspecified occlusion or stenosis of right middle cerebral artery: Secondary | ICD-10-CM | POA: Diagnosis not present

## 2022-06-11 DIAGNOSIS — R278 Other lack of coordination: Secondary | ICD-10-CM | POA: Diagnosis not present

## 2022-06-11 DIAGNOSIS — R262 Difficulty in walking, not elsewhere classified: Secondary | ICD-10-CM

## 2022-06-11 DIAGNOSIS — I6601 Occlusion and stenosis of right middle cerebral artery: Secondary | ICD-10-CM | POA: Insufficient documentation

## 2022-06-11 DIAGNOSIS — R2681 Unsteadiness on feet: Secondary | ICD-10-CM | POA: Diagnosis not present
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
## 2022-06-11 NOTE — Therapy (Addendum)
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 and abduction to prepare for ROM.    EStim:   Pt. tolerated Estim frequency:, duty cycle: 50% cycle time: 10/10. Intensity 28 for 53min. Pt. worked on holding  wrist extension through the up, and down ramp cycle, and flexing digits during the off cycle.    Pt. continues to improve with left shoulder AAROM. Pt. continues to engage his left UE, and hand during more daily ADL, and IADL tasks. Pt. reports that he has been trying to engage his left hand for gripping, and thumb flexion. Pt. Presents with a new onset of low to mid back pain starting over the weekend. Pt. reports that he [place] have turned wrong when reaching for something over the weekend. Pt. Reports that he did use his blower backpack this weekend in the  yard. Pt. is now using his left hand to initiate scratching an itch on the RUE, and is now engaging his LUE more for washing his right side. Pt. reports that he uses his right hand only to hold the ladder when getting onto the boat with someone behind him. Pt. continues to work on increasing left shoulder flexion, and abduction with erasing vertical lines of progressively increasing heights. Pt. continues to engage his left hand consistently during ADLs, and IADL tasks. Pt. presents with initiating lateral grasp motion, however has difficulty formulating a lateral grasp to grasp small items. Pt. continues to respond very well to moist heat, manual therapy, and there. Ex. Pt. continues to require cues for form, and technique with right triceps strengthening with the [employer]. Pt. was able to perform triceps on the [employer]. Pt. continues to present with increased flexor tone, and tightness proximally in the scapular region initially, and LUE today. Pt. presented  with less tightness in the right side of the trunk during trunk elongation stretches. Pt. Presented increased  tightness in the left forearm pronators limiting supination during rowing today. Pt. requires assist, and cues for gross digit gripping, and extension when releasing objects. Pt. tolerated scapula mobilizations, and trunk elongation stretches. Pt. continues to respond well to manual therapy for his scapular movements gliding more freely following therapy. Pt. Continues to use his left hand to pull the doorknob while closing the door. Pt. continues to consistently engage, and use his left hand more during daily ADL/IADL tasks including: washing his hair, scratching an itch, holding bottle, holding sticks during yard cleanup, and opening a screen door. Pt. requires fewer cues to avoid compensation proximally with hiking in the left shoulder, and leaning into the movement with his trunk when reaching. Pt. continues to present with limited left  thumb motion making it difficult to hold objects. Pt. continues to progress with AROM in the LUE. Pt. continues to require cues for left sided awareness, and for motor planning through movements on the left. Pt. continues to present with increased wrist extension consistently, as well as consistent MP, PIP, and DIP extension. Pt. continues to work on normalizing tone, and facilitating consistent active movement in order to work towards improving engagement of the left upper extremity during ADLs and IADL tasks.         PATIENT EDUCATION: Education details:  LUE functioning, trunk elongation stretch options at home, scapular retraction with red theraband. Person educated: Patient Education method: Explanation Education comprehension: verbalized understanding, returned demonstration, verbal cues required, tactile cues required, and needs further education   HOME EXERCISE PROGRAM Continue ongoing HEPs for the LUE, Scapular retraction with red theraband  MEASUREMENTS:  Left shoulder in supine Flexion:  118(125) Abduction: 83(93) Wrist extension: 28(40)   OT Short Term Goals - 03/20/22 1206       OT SHORT TERM GOAL #1   Title Pt. will improve edema by 1 cm in the left wrist, and MCPs to prepare for ROM    Baseline 40th: 18 cm at wrist, MCPs 20.5 cm 30th visit: Edema in improving. 8/3/0/2022: Left wrist 19cm, MCPs 21 cm. 05/24/2021: Edema is improving. Eval: Left wrist 19cm, MCPs 22 cm    Time 6    Period Weeks    Status Achieved    Target Date 07/17/21              OT Long Term Goals - 03/26/22 1529       OT LONG TERM GOAL #1   Title Pt. will improve FOTO score by 3 points to demostrate clinically significant changes.    Baseline 03/20/22: FOTO 45. 01/30/2022: 48 01/09/2022: 44 12/18/2021: TBD 11/21/2021: 47 11/01/2023: FOTO: 42, FOTO score: 44 60th visit: FOTO score 47. 50th visit: FOTO score: 47 TR score: 56 40th: 41. 30th visit: FOTO: 44 Eval: FOTO score 43 130th visit: FOTO score  50.    Time 12    Period Weeks    Status  Achieved   Target Date 06/18/22      OT LONG TERM GOAL #2   Title Pt. will improve left shoulder flexion by 10 degrees to assist with UE dressing.    Baseline 05/14/2022: [id number]) 03/20/22: 105 (120). 02/16/2022:122(138)  4/27/2023TZ:[id number]) 4/05/2023IL:[id number]) Pt. is improving with UE dressing, however requires assist identifying when T shirts are inside out or backwards. 11/21/2021: Left shoulder flexion 125(133) Shoulder [phone]). Pt. continues to present with limited left shoulder flexion,  however has improved with UE dressing. 60th: left shoulder flexion 111(118) 50th: 108 (108) Pt. is improving with consistency in donning a jacket. 40th: 85 (100). 30th visit: 83(105), 06/05/2021: Left shoulder flexion 82(105) 05/24/2021: Left shoulder ROM continues to be limited. 10th visit: Limited left shoulder ROM Eval: R: 96(134), Left 82(92)    Time 12    Period Weeks    Status On-going    Target Date 06/18/22      OT LONG TERM GOAL #3   Title Pt. will improve active left digit grasp to be able to hold, and hike his pants independently.    Baseline 05/14/2022: Pt. Is able to hook his left digits on the belt loop to assist with hiking pants. Pt. Is unable to grasp pants. 03/20/22: pt can hook fingers on pocket to pull, diffiulty grasping (L grip 0#).  02/26/2022: Pt. is improving with grasp patterns, however continues to have difficulty hiking pants with his left hand.01/31/2022: Pt. is starting to formulate a grasp pattern. Improving with digit fexion to [facility]. 01/10/2022: Pt. uses his left hand to assist with carrying items, and attempts to engage in hiking clothing, however has difficulty securely holding his pants to hike them on the left.  Pt. 12/18/2021: Pt. continues to make progress with fisting, however continues to present with tightness, and difficulty hiking clothing. 11/21/2021: Pt. is improving with left digit flexion to the [facility], however has difficulty hiking  pants. Pt.continues to  improving with digit flexion, however has difficulty grasping and hiking his pants. 60th: Pt. is improving with digit flexion, however, is unable to hold pants while hiking them up. 50th: Pt. continues to consistently activate, and initiate digit flexion. Pt. is unable to hold, or hike pants. 40th: consistently activates digit flexion to grasp dynamometer (0 lb).  30th visit: Pt. continues to be able to consistently initate digit flexion in preparation for initiating active functional grasping. 06/05/2021: Pt. is consistently starting to initiate active left digit flexion in preparation for initiaing functional grasping. 05/24/2021: Pt. is intermiitently initiating gross grasping. 10th visit: Pt. presents with limited active grasp. Eval: No active left digit flexion. pt. has difficulty hikig pants    Time 12    Period Weeks    Status On-going    Target Date 06/18/22      OT LONG TERM GOAL #5   Title Pt. will initiate active digit extension in preparation for releasing objects from his hand.    Baseline 05/14/2022:  Pt. Removed 4 pegs from the 9 Hole Peg Test in 2 min. And 30 sec. 03/20/22: Improving - removed pegs from 9 hole PEG test in 1 min 3 sec. 02/26/2022: Pt. continues to worke don improving left hand digit extension for actively releasing objects. 01/31/2022: Pt. is improving with digit extension, however has difficulty consistently releasing objects from his hand. 01/09/2022: Pt. continues to improve with digit extension, however continues to have difficulty releasing objects. 12/18/2021: Pt. is improving with digit extension, however has difficulty releasing objects.12/01/2021: Pt. is improving with digit extension, however is having difficulty releasing objects from his left hand. Pt. continues to improve with gross digit extension, and releasing objects from his hand. 60th visit: Pt. is improving wit digit extension in preparation for releasing objest from his hand. 50th visit: Pt.  is consistentyl improving active digit extension for releasing objects. 40th: 3rd/4th digit active extension greater than 1st, 2nd, and 5th. 30th visit: Pt. is consisitently initiating active left digit extension. 06/05/2021: Pt. is consistently iniating active left  digit extension, however is unable to actively release objects from his hand.05/24/2021: Pt. is consistently initiating active digit extensors. 10th visit: Pt. is intermittently initiating active digit extension. Eval: No active digit extension facilitated. pt. is unable to actively release objects with her left hand.    Time 12    Period Weeks    Status On-going    Target Date 06/18/22      OT LONG TERM GOAL #6   Title Pt. will demonstrate use of visual compensatory strategies 100% of the time when navigating through his environments, and working on tabletop tasks.    Baseline 05/14/2022: Pt. Is using visual compensatory stragies. Pt. Bumps into items of the left in his kitchen. 03/20/22: does well in open spaces, difficulty in tight kitchen and while dual tasking. 02/26/2022;Pt. continues to require cues for left sided awareness. Pt. tends to bump his LUE into his kitchen table at home. 01/31/2022: pt. continues to have limited awareness of the left UE, and tends to bump into objects. 01/09/2022: Pt. continues to present with limited left sided awareness, requiring cues for left sided weakness. 60th visit: Pt presents with limited awareness of the LUE. 50th visit: Pt. prepsents with degreased awarenes  of the LUE.  40th: utilizes strategies in home, continues to have difficulty using strategies in community. 30th visit: Pt. conitnues to utilize compensatory strategies, however accassionally misses items on the left. 06/05/2021: Pt. continues to utilize visual  compensatory stratgeties, however occassionally misses items on the left. . 05/24/2021: pt. continues to utilize visual compensatory strategies  when maneuvering through his environment. 10th  visit: Pt. is progressing with visual compensatory strategies when moving through his environment. Eval: Pt. is limited    Time 12    Period Weeks    Status On-going    Target Date 06/18/22      OT LONG TERM GOAL #7   Title Pt. will improve left wrist extension by 10 degrees in preparation for initiating functional reaching for objects.    Baseline 05/14/2022: 28 (40) 03/20/22: 10 (20) 02/26/2022:17(45) 01/31/2022: 17(45) 01/09/2022: left wrist extension 5(45)    Time 12    Period Weeks    Status On-going    Target Date 06/18/22              Plan - 03/27/22 1810     Clinical Impression Statement Pt. continues to improve with left shoulder AAROM. Pt. continues to engage his left UE, and hand during more daily ADL, and IADL tasks. Pt. reports that he has been trying to engage his left hand for gripping, and thumb flexion. Pt. Presents with a new onset of low to mid back pain starting over the weekend. Pt. reports that he [place] have turned wrong when reaching for something over the weekend. Pt. Reports that he did use his blower backpack this weekend in the yard. Pt. is now using his left hand to initiate scratching an itch on the RUE, and is now engaging his LUE more for washing his right side. Pt. reports that he uses his right hand only to hold the ladder when getting onto the boat with someone behind him. Pt. continues to work on increasing left shoulder flexion, and abduction with erasing vertical lines of progressively increasing heights. Pt. continues to engage his left hand consistently during ADLs, and IADL tasks. Pt. presents with initiating lateral grasp motion, however has difficulty formulating a lateral grasp to grasp small items. Pt. continues to respond very well to moist heat, manual therapy,  and there. Ex. Pt. continues to require cues for form, and technique with right triceps strengthening with the [employer]. Pt. was able to perform triceps on the [employer]. Pt. continues to  present with increased flexor tone, and tightness proximally in the scapular region initially, and LUE today. Pt. presented with less tightness in the right side of the trunk during trunk elongation stretches. Pt. Presented increased  tightness in the left forearm pronators limiting supination during rowing today. Pt. requires assist, and cues for gross digit gripping, and extension when releasing objects. Pt. tolerated scapula mobilizations, and trunk elongation stretches. Pt. continues to respond well to manual therapy for his scapular movements gliding more freely following therapy. Pt. Continues to use his left hand to pull the doorknob while closing the door. Pt. continues to consistently engage, and use his left hand more during daily ADL/IADL tasks including: washing his hair, scratching an itch, holding bottle, holding sticks during yard cleanup, and opening a screen door. Pt. requires fewer cues to avoid compensation proximally with hiking in the left shoulder, and leaning into the movement with his trunk when reaching. Pt. continues to present with limited left thumb motion making it difficult to hold objects. Pt. continues to progress with AROM in the LUE. Pt. continues to require cues for left sided awareness, and for motor planning through movements on the left. Pt. continues to present with increased wrist extension consistently, as well as consistent MP, PIP, and DIP extension. Pt. continues to work on normalizing tone, and facilitating consistent active movement in order to work towards improving engagement of the left upper extremity during ADLs and IADL tasks.       OT Occupational Profile and History Detailed Assessment- Review of Records and additional review of physical, cognitive, psychosocial history related to current functional performance    Occupational performance deficits (Please refer to evaluation for details): ADL's;IADL's    Body Structure / Function / Physical Skills  ADL;Coordination;Endurance;GMC;UE functional use;Balance;Sensation;Body mechanics;Flexibility;IADL;Pain;Dexterity;FMC;Proprioception;Strength;Edema;Mobility;ROM;Tone    Rehab Potential Good    Clinical Decision Making Several treatment options, min-mod task modification necessary    Comorbidities Affecting Occupational Performance: Presence of comorbidities impacting occupational performance    Modification or Assistance to Complete Evaluation  Min-Moderate modification of tasks or assist with assess necessary to complete eval    OT Frequency 3x / week    OT Duration 12 weeks    OT Treatment/Interventions Self-care/ADL training;Psychosocial skills training;Neuromuscular education;Patient/family education;Energy conservation;Therapeutic exercise;DME and/or AE instruction;Therapeutic activities    Consulted and Agree with Plan of Care Family member/caregiver;Patient            Harrel Carina, MS, OTR/L   Harrel Carina, OT 06/11/2022, 10:19 AM

## 2022-06-12 ENCOUNTER — Encounter: Payer: Self-pay | Admitting: Occupational Therapy

## 2022-06-12 ENCOUNTER — Ambulatory Visit: Payer: Medicare HMO | Admitting: Occupational Therapy

## 2022-06-12 DIAGNOSIS — I63511 Cerebral infarction due to unspecified occlusion or stenosis of right middle cerebral artery: Secondary | ICD-10-CM | POA: Diagnosis not present

## 2022-06-12 DIAGNOSIS — R262 Difficulty in walking, not elsewhere classified: Secondary | ICD-10-CM | POA: Diagnosis not present

## 2022-06-12 DIAGNOSIS — M6281 Muscle weakness (generalized): Secondary | ICD-10-CM

## 2022-06-12 DIAGNOSIS — R482 Apraxia: Secondary | ICD-10-CM | POA: Diagnosis not present

## 2022-06-12 DIAGNOSIS — R278 Other lack of coordination: Secondary | ICD-10-CM | POA: Diagnosis not present

## 2022-06-12 DIAGNOSIS — R2681 Unsteadiness on feet: Secondary | ICD-10-CM | POA: Diagnosis not present

## 2022-06-12 DIAGNOSIS — I6601 Occlusion and stenosis of right middle cerebral artery: Secondary | ICD-10-CM | POA: Diagnosis not present

## 2022-06-12 DIAGNOSIS — R269 Unspecified abnormalities of gait and mobility: Secondary | ICD-10-CM | POA: Diagnosis not present

## 2022-06-12 NOTE — Therapy (Addendum)
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 left shoulder flexion, and abduction with erasing vertical lines of progressively increasing heights. Pt. continues to engage his left hand consistently during ADLs, and IADL tasks. Pt. presents with initiating lateral grasp motion, however has difficulty formulating a lateral grasp to grasp small items. Pt. continues to respond very well to moist heat, manual therapy, and there. Ex. Pt. continues to require cues for form, and technique with right triceps strengthening with the [employer]. Pt. was able to perform triceps on the [employer]. Pt. continues to present with increased flexor tone, and tightness proximally in the scapular region initially, and LUE today. Pt. presented with less tightness in the right side of the trunk during trunk elongation stretches. Pt. Presented increased  tightness in the left forearm pronators limiting supination during rowing today. Pt. requires assist, and cues for  gross digit gripping, and extension when releasing objects. Pt. tolerated scapula mobilizations, and trunk elongation stretches. Pt. continues to respond well to manual therapy for his scapular movements gliding more freely following therapy. Pt. Continues to use his left hand to pull the doorknob while closing the door. Pt. continues to consistently engage, and use his left hand more during daily ADL/IADL tasks including: washing his hair, scratching an itch, holding bottle, holding sticks during yard cleanup, and opening a screen door. Pt. requires fewer cues to avoid compensation proximally with hiking in the left shoulder, and leaning into the movement with his trunk when reaching. Pt. continues to present with limited left thumb motion making it difficult to hold objects.  Pt. continues to progress with AROM in the LUE. Pt. continues to require cues for left sided awareness, and for motor planning through movements on the left. Pt. continues to present with increased wrist extension consistently, as well as consistent MP, PIP, and DIP extension. Pt. continues to work on normalizing tone, and facilitating consistent active movement in order to work towards improving engagement of the left upper extremity during ADLs and IADL tasks.         PATIENT EDUCATION: Education details:  LUE functioning, trunk elongation stretch options at home, scapular retraction with red theraband. Person educated: Patient Education method: Explanation Education comprehension: verbalized understanding, returned demonstration, verbal cues required, tactile cues required, and needs further education   HOME EXERCISE PROGRAM Continue ongoing HEPs for the LUE, Scapular retraction with red theraband  MEASUREMENTS:  Left shoulder in supine Flexion:  118(125) Abduction: 83(93) Wrist extension: 28(40)   OT Short Term Goals - 03/20/22 1206       OT SHORT TERM GOAL #1   Title Pt. will improve edema by 1 cm in the left  wrist, and MCPs to prepare for ROM    Baseline 40th: 18 cm at wrist, MCPs 20.5 cm 30th visit: Edema in improving. 8/3/0/2022: Left wrist 19cm, MCPs 21 cm. 05/24/2021: Edema is improving. Eval: Left wrist 19cm, MCPs 22 cm    Time 6    Period Weeks    Status Achieved    Target Date 07/17/21              OT Long Term Goals - 03/26/22 1529       OT LONG TERM GOAL #1   Title Pt. will improve FOTO score by 3 points to demostrate clinically significant changes.    Baseline 03/20/22: FOTO 45. 01/30/2022: 48 01/09/2022: 44 12/18/2021: TBD 11/21/2021: 47 11/01/2023: FOTO: 42, FOTO score: 44 60th visit: FOTO score 47. 50th visit: FOTO score: 47 TR score: 56 40th: 41. 30th visit: FOTO: 44 Eval: FOTO score 43 130th visit: FOTO score 50.    Time 12    Period Weeks    Status  Achieved   Target Date 06/18/22      OT LONG TERM GOAL #2   Title Pt. will improve left shoulder flexion by 10 degrees to assist with UE dressing.    Baseline 05/14/2022: [id number]) 03/20/22: 105 (120). 02/16/2022:122(138)  4/27/2023TZ:[id number]) 4/05/2023IL:[id number]) Pt. is improving with UE dressing, however requires assist identifying when T shirts are inside out or backwards. 11/21/2021: Left shoulder flexion 125(133) Shoulder [phone]). Pt. continues to present with limited left shoulder flexion, however has improved with UE dressing. 60th: left shoulder flexion 111(118) 50th: 108 (108) Pt. is improving with consistency in donning a jacket. 40th: 85 (100). 30th visit: 83(105), 06/05/2021: Left shoulder flexion 82(105) 05/24/2021: Left shoulder ROM continues to be limited. 10th visit: Limited left shoulder ROM Eval: R: 96(134), Left 82(92)    Time 12    Period Weeks    Status On-going    Target Date 06/18/22      OT LONG TERM GOAL #3   Title Pt. will improve active left digit grasp to be able to hold, and hike his pants independently.    Baseline 05/14/2022: Pt. Is able to hook his left digits on the belt loop to assist with hiking  pants. Pt. Is unable to grasp pants. 03/20/22: pt can hook fingers on pocket to pull, diffiulty grasping (L grip 0#).  02/26/2022: Pt. is improving with grasp patterns, however continues  to have difficulty hiking pants with his left hand.01/31/2022: Pt. is starting to formulate a grasp pattern. Improving with digit fexion to [facility]. 01/10/2022: Pt. uses his left hand to assist with carrying items, and attempts to engage in hiking clothing, however has difficulty securely holding his pants to hike them on the left.  Pt. 12/18/2021: Pt. continues to make progress with fisting, however continues to present with tightness, and difficulty hiking clothing. 11/21/2021: Pt. is improving with left digit flexion to the [facility], however has difficulty hiking pants. Pt.continues to  improving with digit flexion, however has difficulty grasping and hiking his pants. 60th: Pt. is improving with digit flexion, however, is unable to hold pants while hiking them up. 50th: Pt. continues to consistently activate, and initiate digit flexion. Pt. is unable to hold, or hike pants. 40th: consistently activates digit flexion to grasp dynamometer (0 lb).  30th visit: Pt. continues to be able to consistently initate digit flexion in preparation for initiating active functional grasping. 06/05/2021: Pt. is consistently starting to initiate active left digit flexion in preparation for initiaing functional grasping. 05/24/2021: Pt. is intermiitently initiating gross grasping. 10th visit: Pt. presents with limited active grasp. Eval: No active left digit flexion. pt. has difficulty hikig pants    Time 12    Period Weeks    Status On-going    Target Date 06/18/22      OT LONG TERM GOAL #5   Title Pt. will initiate active digit extension in preparation for releasing objects from his hand.    Baseline 05/14/2022:  Pt. Removed 4 pegs from the 9 Hole Peg Test in 2 min. And 30 sec. 03/20/22: Improving - removed pegs from 9 hole PEG test in 1 min 3 sec.  02/26/2022: Pt. continues to worke don improving left hand digit extension for actively releasing objects. 01/31/2022: Pt. is improving with digit extension, however has difficulty consistently releasing objects from his hand. 01/09/2022: Pt. continues to improve with digit extension, however continues to have difficulty releasing objects. 12/18/2021: Pt. is improving with digit extension, however has difficulty releasing objects.12/01/2021: Pt. is improving with digit extension, however is having difficulty releasing objects from his left hand. Pt. continues to improve with gross digit extension, and releasing objects from his hand. 60th visit: Pt. is improving wit digit extension in preparation for releasing objest from his hand. 50th visit: Pt. is consistentyl improving active digit extension for releasing objects. 40th: 3rd/4th digit active extension greater than 1st, 2nd, and 5th. 30th visit: Pt. is consisitently initiating active left digit extension. 06/05/2021: Pt. is consistently iniating active left digit extension, however is unable to actively release objects from his hand.05/24/2021: Pt. is consistently initiating active digit extensors. 10th visit: Pt. is intermittently initiating active digit extension. Eval: No active digit extension facilitated. pt. is unable to actively release objects with her left hand.    Time 12    Period Weeks    Status On-going    Target Date 06/18/22      OT LONG TERM GOAL #6   Title Pt. will demonstrate use of visual compensatory strategies 100% of the time when navigating through his environments, and working on tabletop tasks.    Baseline 05/14/2022: Pt. Is using visual compensatory stragies. Pt. Bumps into items of the left in his kitchen. 03/20/22: does well in open spaces, difficulty in tight kitchen and while dual tasking. 02/26/2022;Pt. continues to require cues for left sided awareness. Pt. tends to bump his LUE into his kitchen table at home.  01/31/2022: pt. continues  to have limited awareness of the left UE, and tends to bump into objects. 01/09/2022: Pt. continues to present with limited left sided awareness, requiring cues for left sided weakness. 60th visit: Pt presents with limited awareness of the LUE. 50th visit: Pt. prepsents with degreased awarenes  of the LUE.  40th: utilizes strategies in home, continues to have difficulty using strategies in community. 30th visit: Pt. conitnues to utilize compensatory strategies, however accassionally misses items on the left. 06/05/2021: Pt. continues to utilize visual  compensatory stratgeties, however occassionally misses items on the left. . 05/24/2021: pt. continues to utilize visual compensatory strategies  when maneuvering through his environment. 10th visit: Pt. is progressing with visual compensatory strategies when moving through his environment. Eval: Pt. is limited    Time 12    Period Weeks    Status On-going    Target Date 06/18/22      OT LONG TERM GOAL #7   Title Pt. will improve left wrist extension by 10 degrees in preparation for initiating functional reaching for objects.    Baseline 05/14/2022: 28 (40) 03/20/22: 10 (20) 02/26/2022:17(45) 01/31/2022: 17(45) 01/09/2022: left wrist extension 5(45)    Time 12    Period Weeks    Status On-going    Target Date 06/18/22              Plan - 03/27/22 1810     Clinical Impression Statement Pt. continues to improve with left shoulder AAROM. Pt. continues to engage his left UE, and hand during more daily ADL, and IADL tasks. Pt. reports that he has been trying to engage his left hand for gripping, and thumb flexion. Pt. Presents with a new onset of low to mid back pain starting over the weekend. Pt. reports that he [place] have turned wrong when reaching for something over the weekend. Pt. Reports that he did use his blower backpack this weekend in the yard. Pt. is now using his left hand to initiate scratching an itch on the RUE, and is now engaging his LUE more for  washing his right side. Pt. reports that he uses his right hand only to hold the ladder when getting onto the boat with someone behind him. Pt. continues to work on increasing left shoulder flexion, and abduction with erasing vertical lines of progressively increasing heights. Pt. continues to engage his left hand consistently during ADLs, and IADL tasks. Pt. presents with initiating lateral grasp motion, however has difficulty formulating a lateral grasp to grasp small items. Pt. continues to respond very well to moist heat, manual therapy, and there. Ex. Pt. continues to require cues for form, and technique with right triceps strengthening with the [employer]. Pt. was able to perform triceps on the [employer]. Pt. continues to present with increased flexor tone, and tightness proximally in the scapular region initially, and LUE today. Pt. presented with less tightness in the right side of the trunk during trunk elongation stretches. Pt. Presented increased  tightness in the left forearm pronators limiting supination during rowing today. Pt. requires assist, and cues for gross digit gripping, and extension when releasing objects. Pt. tolerated scapula mobilizations, and trunk elongation stretches. Pt. continues to respond well to manual therapy for his scapular movements gliding more freely following therapy. Pt. Continues to use his left hand to pull the doorknob while closing the door. Pt. continues to consistently engage, and use his left hand more during daily ADL/IADL tasks including: washing his hair, scratching an itch,  holding bottle, holding sticks during yard cleanup, and opening a screen door. Pt. requires fewer cues to avoid compensation proximally with hiking in the left shoulder, and leaning into the movement with his trunk when reaching. Pt. continues to present with limited left thumb motion making it difficult to hold objects. Pt. continues to progress with AROM in the LUE. Pt. continues to  require cues for left sided awareness, and for motor planning through movements on the left. Pt. continues to present with increased wrist extension consistently, as well as consistent MP, PIP, and DIP extension. Pt. continues to work on normalizing tone, and facilitating consistent active movement in order to work towards improving engagement of the left upper extremity during ADLs and IADL tasks.       OT Occupational Profile and History Detailed Assessment- Review of Records and additional review of physical, cognitive, psychosocial history related to current functional performance    Occupational performance deficits (Please refer to evaluation for details): ADL's;IADL's    Body Structure / Function / Physical Skills ADL;Coordination;Endurance;GMC;UE functional use;Balance;Sensation;Body mechanics;Flexibility;IADL;Pain;Dexterity;FMC;Proprioception;Strength;Edema;Mobility;ROM;Tone    Rehab Potential Good    Clinical Decision Making Several treatment options, min-mod task modification necessary    Comorbidities Affecting Occupational Performance: Presence of comorbidities impacting occupational performance    Modification or Assistance to Complete Evaluation  Min-Moderate modification of tasks or assist with assess necessary to complete eval    OT Frequency 3x / week    OT Duration 12 weeks    OT Treatment/Interventions Self-care/ADL training;Psychosocial skills training;Neuromuscular education;Patient/family education;Energy conservation;Therapeutic exercise;DME and/or AE instruction;Therapeutic activities    Consulted and Agree with Plan of Care Family member/caregiver;Patient            Harrel Carina, MS, OTR/L   Harrel Carina, OT 06/12/2022, 10:13 AM

## 2022-06-13 ENCOUNTER — Ambulatory Visit: Payer: Medicare HMO | Admitting: Physical Therapy

## 2022-06-13 ENCOUNTER — Ambulatory Visit: Payer: Medicare HMO | Admitting: Occupational Therapy

## 2022-06-13 DIAGNOSIS — R2681 Unsteadiness on feet: Secondary | ICD-10-CM | POA: Diagnosis not present

## 2022-06-13 DIAGNOSIS — R278 Other lack of coordination: Secondary | ICD-10-CM | POA: Diagnosis not present

## 2022-06-13 DIAGNOSIS — R482 Apraxia: Secondary | ICD-10-CM | POA: Diagnosis not present

## 2022-06-13 DIAGNOSIS — R269 Unspecified abnormalities of gait and mobility: Secondary | ICD-10-CM | POA: Diagnosis not present

## 2022-06-13 DIAGNOSIS — R262 Difficulty in walking, not elsewhere classified: Secondary | ICD-10-CM | POA: Diagnosis not present

## 2022-06-13 DIAGNOSIS — I63511 Cerebral infarction due to unspecified occlusion or stenosis of right middle cerebral artery: Secondary | ICD-10-CM | POA: Diagnosis not present

## 2022-06-13 DIAGNOSIS — I6601 Occlusion and stenosis of right middle cerebral artery: Secondary | ICD-10-CM | POA: Diagnosis not present

## 2022-06-13 DIAGNOSIS — M6281 Muscle weakness (generalized): Secondary | ICD-10-CM | POA: Diagnosis not present
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
## 2022-06-16 ENCOUNTER — Encounter: Payer: Self-pay | Admitting: Nurse Practitioner

## 2022-06-17 ENCOUNTER — Ambulatory Visit: Payer: Medicare HMO | Admitting: Occupational Therapy

## 2022-06-17 DIAGNOSIS — I6601 Occlusion and stenosis of right middle cerebral artery: Secondary | ICD-10-CM | POA: Diagnosis not present

## 2022-06-17 DIAGNOSIS — R262 Difficulty in walking, not elsewhere classified: Secondary | ICD-10-CM | POA: Diagnosis not present

## 2022-06-17 DIAGNOSIS — R278 Other lack of coordination: Secondary | ICD-10-CM | POA: Diagnosis not present

## 2022-06-17 DIAGNOSIS — I63511 Cerebral infarction due to unspecified occlusion or stenosis of right middle cerebral artery: Secondary | ICD-10-CM | POA: Diagnosis not present

## 2022-06-17 DIAGNOSIS — M6281 Muscle weakness (generalized): Secondary | ICD-10-CM

## 2022-06-17 DIAGNOSIS — R2681 Unsteadiness on feet: Secondary | ICD-10-CM | POA: Diagnosis not present

## 2022-06-17 DIAGNOSIS — R482 Apraxia: Secondary | ICD-10-CM | POA: Diagnosis not present

## 2022-06-17 DIAGNOSIS — R269 Unspecified abnormalities of gait and mobility: Secondary | ICD-10-CM | POA: Diagnosis not present

## 2022-06-18 ENCOUNTER — Encounter: Payer: Self-pay | Admitting: Physical Therapy

## 2022-06-18 ENCOUNTER — Encounter: Payer: Self-pay | Admitting: Occupational Therapy

## 2022-06-18 ENCOUNTER — Ambulatory Visit: Payer: Medicare HMO | Admitting: Physical Therapy

## 2022-06-18 ENCOUNTER — Ambulatory Visit: Payer: Medicare HMO | Admitting: Occupational Therapy

## 2022-06-18 DIAGNOSIS — M6281 Muscle weakness (generalized): Secondary | ICD-10-CM

## 2022-06-18 DIAGNOSIS — R262 Difficulty in walking, not elsewhere classified: Secondary | ICD-10-CM | POA: Diagnosis not present

## 2022-06-18 DIAGNOSIS — I63511 Cerebral infarction due to unspecified occlusion or stenosis of right middle cerebral artery: Secondary | ICD-10-CM | POA: Diagnosis not present

## 2022-06-18 DIAGNOSIS — R269 Unspecified abnormalities of gait and mobility: Secondary | ICD-10-CM

## 2022-06-18 DIAGNOSIS — R2681 Unsteadiness on feet: Secondary | ICD-10-CM | POA: Diagnosis not present

## 2022-06-18 DIAGNOSIS — I6601 Occlusion and stenosis of right middle cerebral artery: Secondary | ICD-10-CM | POA: Diagnosis not present

## 2022-06-18 DIAGNOSIS — R278 Other lack of coordination: Secondary | ICD-10-CM | POA: Diagnosis not present

## 2022-06-18 DIAGNOSIS — R482 Apraxia: Secondary | ICD-10-CM | POA: Diagnosis not present
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
## 2022-06-19 ENCOUNTER — Ambulatory Visit: Payer: Medicare HMO | Admitting: Occupational Therapy

## 2022-06-19 ENCOUNTER — Encounter: Payer: Self-pay | Admitting: Occupational Therapy

## 2022-06-19 DIAGNOSIS — R269 Unspecified abnormalities of gait and mobility: Secondary | ICD-10-CM | POA: Diagnosis not present

## 2022-06-19 DIAGNOSIS — R262 Difficulty in walking, not elsewhere classified: Secondary | ICD-10-CM | POA: Diagnosis not present

## 2022-06-19 DIAGNOSIS — I6601 Occlusion and stenosis of right middle cerebral artery: Secondary | ICD-10-CM | POA: Diagnosis not present

## 2022-06-19 DIAGNOSIS — R2681 Unsteadiness on feet: Secondary | ICD-10-CM | POA: Diagnosis not present

## 2022-06-19 DIAGNOSIS — R278 Other lack of coordination: Secondary | ICD-10-CM | POA: Diagnosis not present

## 2022-06-19 DIAGNOSIS — R482 Apraxia: Secondary | ICD-10-CM | POA: Diagnosis not present

## 2022-06-19 DIAGNOSIS — I63511 Cerebral infarction due to unspecified occlusion or stenosis of right middle cerebral artery: Secondary | ICD-10-CM | POA: Diagnosis not present

## 2022-06-19 DIAGNOSIS — M6281 Muscle weakness (generalized): Secondary | ICD-10-CM | POA: Diagnosis not present
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
## 2022-06-20 ENCOUNTER — Ambulatory Visit: Payer: Medicare HMO | Admitting: Occupational Therapy

## 2022-06-20 ENCOUNTER — Ambulatory Visit: Payer: Medicare HMO | Admitting: Physical Therapy
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
## 2022-06-25 ENCOUNTER — Ambulatory Visit: Payer: Medicare HMO | Admitting: Physical Therapy

## 2022-06-25 ENCOUNTER — Encounter: Payer: Self-pay | Admitting: Occupational Therapy

## 2022-06-25 ENCOUNTER — Ambulatory Visit: Payer: Medicare HMO | Admitting: Occupational Therapy

## 2022-06-25 DIAGNOSIS — R482 Apraxia: Secondary | ICD-10-CM

## 2022-06-25 DIAGNOSIS — R2681 Unsteadiness on feet: Secondary | ICD-10-CM | POA: Diagnosis not present

## 2022-06-25 DIAGNOSIS — R262 Difficulty in walking, not elsewhere classified: Secondary | ICD-10-CM

## 2022-06-25 DIAGNOSIS — R269 Unspecified abnormalities of gait and mobility: Secondary | ICD-10-CM

## 2022-06-25 DIAGNOSIS — M6281 Muscle weakness (generalized): Secondary | ICD-10-CM

## 2022-06-25 DIAGNOSIS — I63511 Cerebral infarction due to unspecified occlusion or stenosis of right middle cerebral artery: Secondary | ICD-10-CM

## 2022-06-25 DIAGNOSIS — I6601 Occlusion and stenosis of right middle cerebral artery: Secondary | ICD-10-CM | POA: Diagnosis not present

## 2022-06-25 DIAGNOSIS — R278 Other lack of coordination: Secondary | ICD-10-CM
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
## 2022-06-26 ENCOUNTER — Ambulatory Visit: Payer: Medicare HMO | Admitting: Occupational Therapy

## 2022-06-26 ENCOUNTER — Encounter: Payer: Self-pay | Admitting: Occupational Therapy

## 2022-06-26 DIAGNOSIS — R482 Apraxia: Secondary | ICD-10-CM | POA: Diagnosis not present

## 2022-06-26 DIAGNOSIS — R2681 Unsteadiness on feet: Secondary | ICD-10-CM | POA: Diagnosis not present

## 2022-06-26 DIAGNOSIS — R262 Difficulty in walking, not elsewhere classified: Secondary | ICD-10-CM | POA: Diagnosis not present

## 2022-06-26 DIAGNOSIS — R278 Other lack of coordination: Secondary | ICD-10-CM

## 2022-06-26 DIAGNOSIS — M6281 Muscle weakness (generalized): Secondary | ICD-10-CM

## 2022-06-26 DIAGNOSIS — I63511 Cerebral infarction due to unspecified occlusion or stenosis of right middle cerebral artery: Secondary | ICD-10-CM

## 2022-06-26 DIAGNOSIS — R269 Unspecified abnormalities of gait and mobility: Secondary | ICD-10-CM | POA: Diagnosis not present

## 2022-06-26 DIAGNOSIS — I6601 Occlusion and stenosis of right middle cerebral artery: Secondary | ICD-10-CM | POA: Diagnosis not present
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
## 2022-06-27 ENCOUNTER — Encounter: Payer: Self-pay | Admitting: Occupational Therapy

## 2022-06-27 ENCOUNTER — Ambulatory Visit: Payer: Medicare HMO | Admitting: Physical Therapy

## 2022-06-27 ENCOUNTER — Ambulatory Visit: Payer: Medicare HMO | Admitting: Occupational Therapy

## 2022-06-27 DIAGNOSIS — R269 Unspecified abnormalities of gait and mobility: Secondary | ICD-10-CM | POA: Diagnosis not present

## 2022-06-27 DIAGNOSIS — M6281 Muscle weakness (generalized): Secondary | ICD-10-CM | POA: Diagnosis not present

## 2022-06-27 DIAGNOSIS — R482 Apraxia: Secondary | ICD-10-CM | POA: Diagnosis not present

## 2022-06-27 DIAGNOSIS — R278 Other lack of coordination: Secondary | ICD-10-CM | POA: Diagnosis not present

## 2022-06-27 DIAGNOSIS — R2681 Unsteadiness on feet: Secondary | ICD-10-CM | POA: Diagnosis not present

## 2022-06-27 DIAGNOSIS — I6601 Occlusion and stenosis of right middle cerebral artery: Secondary | ICD-10-CM | POA: Diagnosis not present

## 2022-06-27 DIAGNOSIS — I63511 Cerebral infarction due to unspecified occlusion or stenosis of right middle cerebral artery: Secondary | ICD-10-CM | POA: Diagnosis not present

## 2022-06-27 DIAGNOSIS — R262 Difficulty in walking, not elsewhere classified: Secondary | ICD-10-CM | POA: Diagnosis not present
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
## 2022-07-02 ENCOUNTER — Ambulatory Visit: Payer: Medicare HMO | Admitting: Physical Therapy

## 2022-07-02 ENCOUNTER — Encounter: Payer: Self-pay | Admitting: Physical Therapy

## 2022-07-02 ENCOUNTER — Ambulatory Visit: Payer: Medicare HMO | Admitting: Occupational Therapy

## 2022-07-02 DIAGNOSIS — M6281 Muscle weakness (generalized): Secondary | ICD-10-CM

## 2022-07-02 DIAGNOSIS — R2681 Unsteadiness on feet: Secondary | ICD-10-CM

## 2022-07-02 DIAGNOSIS — R269 Unspecified abnormalities of gait and mobility: Secondary | ICD-10-CM

## 2022-07-02 DIAGNOSIS — R262 Difficulty in walking, not elsewhere classified: Secondary | ICD-10-CM | POA: Diagnosis not present

## 2022-07-02 DIAGNOSIS — R482 Apraxia: Secondary | ICD-10-CM | POA: Diagnosis not present

## 2022-07-02 DIAGNOSIS — R278 Other lack of coordination: Secondary | ICD-10-CM | POA: Diagnosis not present

## 2022-07-02 DIAGNOSIS — I6601 Occlusion and stenosis of right middle cerebral artery: Secondary | ICD-10-CM | POA: Diagnosis not present

## 2022-07-02 DIAGNOSIS — I63511 Cerebral infarction due to unspecified occlusion or stenosis of right middle cerebral artery: Secondary | ICD-10-CM | POA: Diagnosis not present
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
## 2022-07-03 ENCOUNTER — Ambulatory Visit: Payer: Medicare HMO | Admitting: Occupational Therapy

## 2022-07-03 ENCOUNTER — Encounter: Payer: Self-pay | Admitting: Occupational Therapy

## 2022-07-03 DIAGNOSIS — R2681 Unsteadiness on feet: Secondary | ICD-10-CM | POA: Diagnosis not present

## 2022-07-03 DIAGNOSIS — R482 Apraxia: Secondary | ICD-10-CM | POA: Diagnosis not present

## 2022-07-03 DIAGNOSIS — M6281 Muscle weakness (generalized): Secondary | ICD-10-CM | POA: Diagnosis not present

## 2022-07-03 DIAGNOSIS — I6601 Occlusion and stenosis of right middle cerebral artery: Secondary | ICD-10-CM | POA: Diagnosis not present

## 2022-07-03 DIAGNOSIS — R262 Difficulty in walking, not elsewhere classified: Secondary | ICD-10-CM | POA: Diagnosis not present

## 2022-07-03 DIAGNOSIS — I63511 Cerebral infarction due to unspecified occlusion or stenosis of right middle cerebral artery: Secondary | ICD-10-CM | POA: Diagnosis not present

## 2022-07-03 DIAGNOSIS — R269 Unspecified abnormalities of gait and mobility: Secondary | ICD-10-CM | POA: Diagnosis not present

## 2022-07-03 DIAGNOSIS — R278 Other lack of coordination: Secondary | ICD-10-CM | POA: Diagnosis not present
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
## 2022-07-04 ENCOUNTER — Ambulatory Visit: Payer: Medicare HMO | Admitting: Occupational Therapy

## 2022-07-04 ENCOUNTER — Encounter: Payer: Self-pay | Admitting: Occupational Therapy

## 2022-07-04 ENCOUNTER — Ambulatory Visit: Payer: Medicare HMO | Admitting: Physical Therapy

## 2022-07-04 DIAGNOSIS — I6601 Occlusion and stenosis of right middle cerebral artery: Secondary | ICD-10-CM | POA: Diagnosis not present

## 2022-07-04 DIAGNOSIS — I63511 Cerebral infarction due to unspecified occlusion or stenosis of right middle cerebral artery: Secondary | ICD-10-CM | POA: Diagnosis not present

## 2022-07-04 DIAGNOSIS — M6281 Muscle weakness (generalized): Secondary | ICD-10-CM

## 2022-07-04 DIAGNOSIS — R262 Difficulty in walking, not elsewhere classified: Secondary | ICD-10-CM | POA: Diagnosis not present

## 2022-07-04 DIAGNOSIS — R482 Apraxia: Secondary | ICD-10-CM | POA: Diagnosis not present

## 2022-07-04 DIAGNOSIS — R2681 Unsteadiness on feet: Secondary | ICD-10-CM | POA: Diagnosis not present

## 2022-07-04 DIAGNOSIS — R269 Unspecified abnormalities of gait and mobility: Secondary | ICD-10-CM | POA: Diagnosis not present

## 2022-07-04 DIAGNOSIS — R278 Other lack of coordination: Secondary | ICD-10-CM | POA: Diagnosis not present
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
## 2022-07-09 ENCOUNTER — Ambulatory Visit: Payer: Medicare HMO | Attending: Physician Assistant | Admitting: Occupational Therapy

## 2022-07-09 ENCOUNTER — Ambulatory Visit: Payer: Medicare HMO

## 2022-07-09 ENCOUNTER — Encounter: Payer: Self-pay | Admitting: Occupational Therapy

## 2022-07-09 DIAGNOSIS — R278 Other lack of coordination: Secondary | ICD-10-CM | POA: Diagnosis not present

## 2022-07-09 DIAGNOSIS — R262 Difficulty in walking, not elsewhere classified: Secondary | ICD-10-CM | POA: Insufficient documentation

## 2022-07-09 DIAGNOSIS — I63511 Cerebral infarction due to unspecified occlusion or stenosis of right middle cerebral artery: Secondary | ICD-10-CM | POA: Insufficient documentation

## 2022-07-09 DIAGNOSIS — R2681 Unsteadiness on feet: Secondary | ICD-10-CM | POA: Diagnosis not present

## 2022-07-09 DIAGNOSIS — M6281 Muscle weakness (generalized): Secondary | ICD-10-CM | POA: Diagnosis not present

## 2022-07-09 DIAGNOSIS — I6601 Occlusion and stenosis of right middle cerebral artery: Secondary | ICD-10-CM | POA: Diagnosis not present

## 2022-07-09 DIAGNOSIS — R269 Unspecified abnormalities of gait and mobility: Secondary | ICD-10-CM
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
## 2022-07-10 ENCOUNTER — Ambulatory Visit: Payer: Medicare HMO | Admitting: Occupational Therapy

## 2022-07-10 DIAGNOSIS — M6281 Muscle weakness (generalized): Secondary | ICD-10-CM | POA: Diagnosis not present

## 2022-07-10 DIAGNOSIS — R269 Unspecified abnormalities of gait and mobility: Secondary | ICD-10-CM | POA: Diagnosis not present

## 2022-07-10 DIAGNOSIS — I63511 Cerebral infarction due to unspecified occlusion or stenosis of right middle cerebral artery: Secondary | ICD-10-CM | POA: Diagnosis not present

## 2022-07-10 DIAGNOSIS — R2681 Unsteadiness on feet: Secondary | ICD-10-CM | POA: Diagnosis not present

## 2022-07-10 DIAGNOSIS — R278 Other lack of coordination: Secondary | ICD-10-CM | POA: Diagnosis not present

## 2022-07-10 DIAGNOSIS — I6601 Occlusion and stenosis of right middle cerebral artery: Secondary | ICD-10-CM | POA: Diagnosis not present

## 2022-07-10 DIAGNOSIS — R262 Difficulty in walking, not elsewhere classified: Secondary | ICD-10-CM | POA: Diagnosis not present
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
## 2022-07-11 ENCOUNTER — Encounter: Payer: Self-pay | Admitting: Occupational Therapy

## 2022-07-11 ENCOUNTER — Ambulatory Visit: Payer: Medicare HMO | Admitting: Occupational Therapy

## 2022-07-11 ENCOUNTER — Ambulatory Visit: Payer: Medicare HMO | Admitting: Physical Therapy

## 2022-07-11 DIAGNOSIS — R269 Unspecified abnormalities of gait and mobility: Secondary | ICD-10-CM | POA: Diagnosis not present

## 2022-07-11 DIAGNOSIS — I63511 Cerebral infarction due to unspecified occlusion or stenosis of right middle cerebral artery: Secondary | ICD-10-CM | POA: Diagnosis not present

## 2022-07-11 DIAGNOSIS — M6281 Muscle weakness (generalized): Secondary | ICD-10-CM | POA: Diagnosis not present

## 2022-07-11 DIAGNOSIS — R278 Other lack of coordination: Secondary | ICD-10-CM | POA: Diagnosis not present

## 2022-07-11 DIAGNOSIS — I6601 Occlusion and stenosis of right middle cerebral artery: Secondary | ICD-10-CM | POA: Diagnosis not present

## 2022-07-11 DIAGNOSIS — R2681 Unsteadiness on feet: Secondary | ICD-10-CM | POA: Diagnosis not present

## 2022-07-11 DIAGNOSIS — R262 Difficulty in walking, not elsewhere classified: Secondary | ICD-10-CM | POA: Diagnosis not present
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
## 2022-07-16 ENCOUNTER — Ambulatory Visit: Payer: Medicare HMO | Admitting: Occupational Therapy

## 2022-07-16 ENCOUNTER — Ambulatory Visit: Payer: Medicare HMO | Admitting: Physical Therapy

## 2022-07-16 DIAGNOSIS — R262 Difficulty in walking, not elsewhere classified: Secondary | ICD-10-CM | POA: Diagnosis not present

## 2022-07-16 DIAGNOSIS — M6281 Muscle weakness (generalized): Secondary | ICD-10-CM

## 2022-07-16 DIAGNOSIS — R269 Unspecified abnormalities of gait and mobility: Secondary | ICD-10-CM | POA: Diagnosis not present

## 2022-07-16 DIAGNOSIS — R278 Other lack of coordination: Secondary | ICD-10-CM | POA: Diagnosis not present

## 2022-07-16 DIAGNOSIS — R2681 Unsteadiness on feet: Secondary | ICD-10-CM

## 2022-07-16 DIAGNOSIS — I6601 Occlusion and stenosis of right middle cerebral artery: Secondary | ICD-10-CM | POA: Diagnosis not present

## 2022-07-16 DIAGNOSIS — I63511 Cerebral infarction due to unspecified occlusion or stenosis of right middle cerebral artery: Secondary | ICD-10-CM | POA: Diagnosis not present
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
## 2022-07-17 ENCOUNTER — Encounter: Payer: Self-pay | Admitting: Occupational Therapy

## 2022-07-17 ENCOUNTER — Ambulatory Visit: Payer: Medicare HMO | Admitting: Occupational Therapy

## 2022-07-17 DIAGNOSIS — R269 Unspecified abnormalities of gait and mobility: Secondary | ICD-10-CM | POA: Diagnosis not present

## 2022-07-17 DIAGNOSIS — I6601 Occlusion and stenosis of right middle cerebral artery: Secondary | ICD-10-CM | POA: Diagnosis not present

## 2022-07-17 DIAGNOSIS — R278 Other lack of coordination: Secondary | ICD-10-CM | POA: Diagnosis not present

## 2022-07-17 DIAGNOSIS — M6281 Muscle weakness (generalized): Secondary | ICD-10-CM

## 2022-07-17 DIAGNOSIS — R262 Difficulty in walking, not elsewhere classified: Secondary | ICD-10-CM | POA: Diagnosis not present

## 2022-07-17 DIAGNOSIS — I63511 Cerebral infarction due to unspecified occlusion or stenosis of right middle cerebral artery: Secondary | ICD-10-CM | POA: Diagnosis not present

## 2022-07-17 DIAGNOSIS — R2681 Unsteadiness on feet: Secondary | ICD-10-CM | POA: Diagnosis not present
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
## 2022-07-18 ENCOUNTER — Encounter: Payer: Self-pay | Admitting: Occupational Therapy

## 2022-07-18 ENCOUNTER — Ambulatory Visit: Payer: Medicare HMO | Admitting: Physical Therapy

## 2022-07-18 ENCOUNTER — Ambulatory Visit: Payer: Medicare HMO | Admitting: Occupational Therapy

## 2022-07-18 DIAGNOSIS — R278 Other lack of coordination: Secondary | ICD-10-CM | POA: Diagnosis not present

## 2022-07-18 DIAGNOSIS — M6281 Muscle weakness (generalized): Secondary | ICD-10-CM | POA: Diagnosis not present

## 2022-07-18 DIAGNOSIS — R262 Difficulty in walking, not elsewhere classified: Secondary | ICD-10-CM | POA: Diagnosis not present

## 2022-07-18 DIAGNOSIS — I6601 Occlusion and stenosis of right middle cerebral artery: Secondary | ICD-10-CM | POA: Diagnosis not present

## 2022-07-18 DIAGNOSIS — R269 Unspecified abnormalities of gait and mobility: Secondary | ICD-10-CM | POA: Diagnosis not present

## 2022-07-18 DIAGNOSIS — I63511 Cerebral infarction due to unspecified occlusion or stenosis of right middle cerebral artery: Secondary | ICD-10-CM | POA: Diagnosis not present

## 2022-07-18 DIAGNOSIS — R2681 Unsteadiness on feet: Secondary | ICD-10-CM | POA: Diagnosis not present
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
## 2022-07-23 ENCOUNTER — Encounter: Payer: Self-pay | Admitting: Physical Therapy

## 2022-07-23 ENCOUNTER — Ambulatory Visit: Payer: Medicare HMO | Admitting: Physical Therapy

## 2022-07-23 ENCOUNTER — Ambulatory Visit: Payer: Medicare HMO | Admitting: Occupational Therapy

## 2022-07-23 DIAGNOSIS — R269 Unspecified abnormalities of gait and mobility: Secondary | ICD-10-CM | POA: Diagnosis not present

## 2022-07-23 DIAGNOSIS — M6281 Muscle weakness (generalized): Secondary | ICD-10-CM | POA: Diagnosis not present

## 2022-07-23 DIAGNOSIS — R278 Other lack of coordination: Secondary | ICD-10-CM | POA: Diagnosis not present

## 2022-07-23 DIAGNOSIS — I63511 Cerebral infarction due to unspecified occlusion or stenosis of right middle cerebral artery: Secondary | ICD-10-CM | POA: Diagnosis not present

## 2022-07-23 DIAGNOSIS — R2681 Unsteadiness on feet: Secondary | ICD-10-CM

## 2022-07-23 DIAGNOSIS — R262 Difficulty in walking, not elsewhere classified: Secondary | ICD-10-CM

## 2022-07-23 DIAGNOSIS — I6601 Occlusion and stenosis of right middle cerebral artery: Secondary | ICD-10-CM | POA: Diagnosis not present
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
## 2022-07-24 ENCOUNTER — Ambulatory Visit: Payer: Medicare HMO | Admitting: Occupational Therapy

## 2022-07-24 DIAGNOSIS — R262 Difficulty in walking, not elsewhere classified: Secondary | ICD-10-CM | POA: Diagnosis not present

## 2022-07-24 DIAGNOSIS — M6281 Muscle weakness (generalized): Secondary | ICD-10-CM

## 2022-07-24 DIAGNOSIS — I63511 Cerebral infarction due to unspecified occlusion or stenosis of right middle cerebral artery: Secondary | ICD-10-CM | POA: Diagnosis not present

## 2022-07-24 DIAGNOSIS — R269 Unspecified abnormalities of gait and mobility: Secondary | ICD-10-CM | POA: Diagnosis not present

## 2022-07-24 DIAGNOSIS — R2681 Unsteadiness on feet: Secondary | ICD-10-CM | POA: Diagnosis not present

## 2022-07-24 DIAGNOSIS — R278 Other lack of coordination: Secondary | ICD-10-CM | POA: Diagnosis not present

## 2022-07-24 DIAGNOSIS — I6601 Occlusion and stenosis of right middle cerebral artery: Secondary | ICD-10-CM | POA: Diagnosis not present
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
## 2022-07-25 ENCOUNTER — Ambulatory Visit: Payer: Medicare HMO | Admitting: Occupational Therapy

## 2022-07-25 ENCOUNTER — Ambulatory Visit: Payer: Medicare HMO | Admitting: Physical Therapy

## 2022-07-25 DIAGNOSIS — M6281 Muscle weakness (generalized): Secondary | ICD-10-CM | POA: Diagnosis not present

## 2022-07-25 DIAGNOSIS — R278 Other lack of coordination: Secondary | ICD-10-CM | POA: Diagnosis not present

## 2022-07-25 DIAGNOSIS — I63511 Cerebral infarction due to unspecified occlusion or stenosis of right middle cerebral artery: Secondary | ICD-10-CM | POA: Diagnosis not present

## 2022-07-25 DIAGNOSIS — R269 Unspecified abnormalities of gait and mobility: Secondary | ICD-10-CM | POA: Diagnosis not present

## 2022-07-25 DIAGNOSIS — R262 Difficulty in walking, not elsewhere classified: Secondary | ICD-10-CM | POA: Diagnosis not present

## 2022-07-25 DIAGNOSIS — R2681 Unsteadiness on feet: Secondary | ICD-10-CM | POA: Diagnosis not present

## 2022-07-25 DIAGNOSIS — I6601 Occlusion and stenosis of right middle cerebral artery: Secondary | ICD-10-CM | POA: Diagnosis not present
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
## 2022-07-30 ENCOUNTER — Ambulatory Visit: Payer: Medicare HMO | Admitting: Occupational Therapy

## 2022-07-30 ENCOUNTER — Ambulatory Visit: Payer: Medicare HMO

## 2022-07-30 DIAGNOSIS — R2681 Unsteadiness on feet: Secondary | ICD-10-CM

## 2022-07-30 DIAGNOSIS — R269 Unspecified abnormalities of gait and mobility: Secondary | ICD-10-CM | POA: Diagnosis not present

## 2022-07-30 DIAGNOSIS — I6601 Occlusion and stenosis of right middle cerebral artery: Secondary | ICD-10-CM | POA: Diagnosis not present

## 2022-07-30 DIAGNOSIS — R262 Difficulty in walking, not elsewhere classified: Secondary | ICD-10-CM

## 2022-07-30 DIAGNOSIS — R278 Other lack of coordination: Secondary | ICD-10-CM | POA: Diagnosis not present

## 2022-07-30 DIAGNOSIS — I63511 Cerebral infarction due to unspecified occlusion or stenosis of right middle cerebral artery: Secondary | ICD-10-CM | POA: Diagnosis not present

## 2022-07-30 DIAGNOSIS — M6281 Muscle weakness (generalized): Secondary | ICD-10-CM
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
## 2022-07-31 ENCOUNTER — Ambulatory Visit: Payer: Medicare HMO | Admitting: Occupational Therapy

## 2022-07-31 ENCOUNTER — Encounter: Payer: Self-pay | Admitting: Occupational Therapy

## 2022-07-31 DIAGNOSIS — M6281 Muscle weakness (generalized): Secondary | ICD-10-CM

## 2022-07-31 DIAGNOSIS — I6601 Occlusion and stenosis of right middle cerebral artery: Secondary | ICD-10-CM | POA: Diagnosis not present

## 2022-07-31 DIAGNOSIS — R2681 Unsteadiness on feet: Secondary | ICD-10-CM | POA: Diagnosis not present

## 2022-07-31 DIAGNOSIS — R278 Other lack of coordination: Secondary | ICD-10-CM | POA: Diagnosis not present

## 2022-07-31 DIAGNOSIS — R262 Difficulty in walking, not elsewhere classified: Secondary | ICD-10-CM | POA: Diagnosis not present

## 2022-07-31 DIAGNOSIS — I63511 Cerebral infarction due to unspecified occlusion or stenosis of right middle cerebral artery: Secondary | ICD-10-CM | POA: Diagnosis not present

## 2022-07-31 DIAGNOSIS — R269 Unspecified abnormalities of gait and mobility: Secondary | ICD-10-CM | POA: Diagnosis not present
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
## 2022-08-01 ENCOUNTER — Ambulatory Visit: Payer: Medicare HMO | Admitting: Occupational Therapy

## 2022-08-01 ENCOUNTER — Ambulatory Visit: Payer: Medicare HMO | Admitting: Physical Therapy

## 2022-08-01 DIAGNOSIS — R2681 Unsteadiness on feet: Secondary | ICD-10-CM | POA: Diagnosis not present

## 2022-08-01 DIAGNOSIS — M6281 Muscle weakness (generalized): Secondary | ICD-10-CM

## 2022-08-01 DIAGNOSIS — I6601 Occlusion and stenosis of right middle cerebral artery: Secondary | ICD-10-CM | POA: Diagnosis not present

## 2022-08-01 DIAGNOSIS — R269 Unspecified abnormalities of gait and mobility: Secondary | ICD-10-CM | POA: Diagnosis not present

## 2022-08-01 DIAGNOSIS — R278 Other lack of coordination: Secondary | ICD-10-CM | POA: Diagnosis not present

## 2022-08-01 DIAGNOSIS — R262 Difficulty in walking, not elsewhere classified: Secondary | ICD-10-CM | POA: Diagnosis not present

## 2022-08-01 DIAGNOSIS — I63511 Cerebral infarction due to unspecified occlusion or stenosis of right middle cerebral artery: Secondary | ICD-10-CM | POA: Diagnosis not present
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
## 2022-08-02 ENCOUNTER — Encounter: Payer: Medicare HMO | Attending: Physical Medicine & Rehabilitation | Admitting: Physical Medicine & Rehabilitation

## 2022-08-02 ENCOUNTER — Encounter: Payer: Self-pay | Admitting: Physical Medicine & Rehabilitation

## 2022-08-02 VITALS — BP 172/86 | HR 75 | Ht 70.0 in | Wt 163.0 lb

## 2022-08-02 DIAGNOSIS — I69354 Hemiplegia and hemiparesis following cerebral infarction affecting left non-dominant side: Secondary | ICD-10-CM | POA: Diagnosis not present

## 2022-08-02 NOTE — Patient Instructions (Signed)
Stroke Prevention Some medical conditions and lifestyle choices can lead to a higher risk for a stroke. You can help to prevent a stroke by eating healthy foods and exercising. It also helps to not smoke and to manage any health problems you [place] have. How can this condition affect me? A stroke is an emergency. It should be treated right away. A stroke can lead to brain damage or threaten your life. There is a better chance of surviving and getting better after a stroke if you get medical help right away. What can increase my risk? The following medical conditions [place] increase your risk of a stroke: Diseases of the heart and blood vessels (cardiovascular disease). High blood pressure (hypertension). Diabetes. High cholesterol. Sickle cell disease. Problems with blood clotting. Being very overweight. Sleeping problems (obstructivesleep apnea). Other risk factors include: Being older than age 60. A history of blood clots, stroke, or mini-stroke (TIA). Race, ethnic background, or a family history of stroke. Smoking or using tobacco products. Taking birth control pills, especially if you smoke. Heavy alcohol and drug use. Not being active. What actions can I take to prevent this? Manage your health conditions High cholesterol. Eat a healthy diet. If this is not enough to manage your cholesterol, you [place] need to take medicines. Take medicines as told by your doctor. High blood pressure. Try to keep your blood pressure below 130/80. If your blood pressure cannot be managed through a healthy diet and regular exercise, you [place] need to take medicines. Take medicines as told by your doctor. Ask your doctor if you should check your blood pressure at home. Have your blood pressure checked every year. Diabetes. Eat a healthy diet and get regular exercise. If your blood sugar (glucose) cannot be managed through diet and exercise, you [place] need to take medicines. Take medicines as told by your  doctor. Talk to your doctor about getting checked for sleeping problems. Signs of a problem can include: Snoring a lot. Feeling very tired. Make sure that you manage any other conditions you have. Nutrition  Follow instructions from your doctor about what to eat or drink. You [place] be told to: Eat and drink fewer calories each day. Limit how much salt (sodium) you use to 1,500 milligrams (mg) each day. Use only healthy fats for cooking, such as olive oil, canola oil, and sunflower oil. Eat healthy foods. To do this: Choose foods that are high in fiber. These include whole grains, and fresh fruits and vegetables. Eat at least 5 servings of fruits and vegetables a day. Try to fill one-half of your plate with fruits and vegetables at each meal. Choose low-fat (lean) proteins. These include low-fat cuts of meat, chicken without skin, fish, tofu, beans, and nuts. Eat low-fat dairy products. Avoid foods that: Are high in salt. Have saturated fat. Have trans fat. Have cholesterol. Are processed or pre-made. Count how many carbohydrates you eat and drink each day. Lifestyle If you drink alcohol: Limit how much you have to: 0-1 drink a day for women who are not pregnant. 0-2 drinks a day for men. Know how much alcohol is in your drink. In the [place] one drink equals one 12 oz bottle of beer (355mL), one 5 oz glass of wine (148mL), or one 1 oz glass of hard liquor (44mL). Do not smoke or use any products that have nicotine or tobacco. If you need help quitting, ask your doctor. Avoid secondhand smoke. Do not use drugs. Activity  Try to stay at a   healthy weight. Get at least 30 minutes of exercise on most days, such as: Fast walking. Biking. Swimming. Medicines Take over-the-counter and prescription medicines only as told by your doctor. Avoid taking birth control pills. Talk to your doctor about the risks of taking birth control pills if: You are over 35 years old. You smoke. You get  very bad headaches. You have had a blood clot. Where to find more information American Stroke Association: www.strokeassociation.org Get help right away if: You or a loved one has any signs of a stroke. "BE FAST" is an easy way to remember the warning signs: B - Balance. Dizziness, sudden trouble walking, or loss of balance. E - Eyes. Trouble seeing or a change in how you see. F - Face. Sudden weakness or loss of feeling of the face. The face or eyelid [place] droop on one side. A - Arms. Weakness or loss of feeling in an arm. This happens all of a sudden and most often on one side of the body. S - Speech. Sudden trouble speaking, slurred speech, or trouble understanding what people say. T - Time. Time to call emergency services. Write down what time symptoms started. You or a loved one has other signs of a stroke, such as: A sudden, very bad headache with no known cause. Feeling like you [place] vomit (nausea). Vomiting. A seizure. These symptoms [place] be an emergency. Get help right away. Call your local emergency services (911 in the [place] Do not wait to see if the symptoms will go away. Do not drive yourself to the hospital. Summary You can help to prevent a stroke by eating healthy, exercising, and not smoking. It also helps to manage any health problems you have. Do not smoke or use any products that contain nicotine or tobacco. Get help right away if you or a loved one has any signs of a stroke. This information is not intended to replace advice given to you by your health care provider. Make sure you discuss any questions you have with your health care provider. Document Revised: 04/24/2020 Document Reviewed: 04/24/2020 Elsevier Patient Education  2023 [employer]  
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
## 2022-08-06 ENCOUNTER — Ambulatory Visit: Payer: Medicare HMO | Admitting: Occupational Therapy

## 2022-08-06 ENCOUNTER — Ambulatory Visit: Payer: Medicare HMO

## 2022-08-06 DIAGNOSIS — R269 Unspecified abnormalities of gait and mobility: Secondary | ICD-10-CM | POA: Diagnosis not present

## 2022-08-06 DIAGNOSIS — I63511 Cerebral infarction due to unspecified occlusion or stenosis of right middle cerebral artery: Secondary | ICD-10-CM | POA: Diagnosis not present

## 2022-08-06 DIAGNOSIS — M6281 Muscle weakness (generalized): Secondary | ICD-10-CM

## 2022-08-06 DIAGNOSIS — I6601 Occlusion and stenosis of right middle cerebral artery: Secondary | ICD-10-CM | POA: Diagnosis not present

## 2022-08-06 DIAGNOSIS — R262 Difficulty in walking, not elsewhere classified: Secondary | ICD-10-CM

## 2022-08-06 DIAGNOSIS — R2681 Unsteadiness on feet: Secondary | ICD-10-CM | POA: Diagnosis not present

## 2022-08-06 DIAGNOSIS — R278 Other lack of coordination: Secondary | ICD-10-CM
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
## 2022-08-07 ENCOUNTER — Ambulatory Visit: Payer: Medicare HMO | Attending: Nurse Practitioner | Admitting: Occupational Therapy

## 2022-08-07 ENCOUNTER — Ambulatory Visit: Payer: Medicare HMO | Admitting: Occupational Therapy

## 2022-08-07 ENCOUNTER — Encounter: Payer: Self-pay | Admitting: Occupational Therapy

## 2022-08-07 ENCOUNTER — Ambulatory Visit: Payer: Medicare HMO | Admitting: Physical Therapy

## 2022-08-07 DIAGNOSIS — R262 Difficulty in walking, not elsewhere classified: Secondary | ICD-10-CM | POA: Diagnosis not present

## 2022-08-07 DIAGNOSIS — R2681 Unsteadiness on feet: Secondary | ICD-10-CM | POA: Diagnosis not present

## 2022-08-07 DIAGNOSIS — M6281 Muscle weakness (generalized): Secondary | ICD-10-CM | POA: Diagnosis not present

## 2022-08-07 DIAGNOSIS — R269 Unspecified abnormalities of gait and mobility: Secondary | ICD-10-CM | POA: Diagnosis not present

## 2022-08-07 DIAGNOSIS — R278 Other lack of coordination: Secondary | ICD-10-CM | POA: Insufficient documentation
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
## 2022-08-08 ENCOUNTER — Ambulatory Visit: Payer: Medicare HMO | Admitting: Occupational Therapy

## 2022-08-08 DIAGNOSIS — M6281 Muscle weakness (generalized): Secondary | ICD-10-CM | POA: Diagnosis not present

## 2022-08-08 DIAGNOSIS — R2681 Unsteadiness on feet: Secondary | ICD-10-CM | POA: Diagnosis not present

## 2022-08-08 DIAGNOSIS — R269 Unspecified abnormalities of gait and mobility: Secondary | ICD-10-CM | POA: Diagnosis not present

## 2022-08-08 DIAGNOSIS — R278 Other lack of coordination: Secondary | ICD-10-CM

## 2022-08-08 DIAGNOSIS — R262 Difficulty in walking, not elsewhere classified: Secondary | ICD-10-CM | POA: Diagnosis not present
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
## 2022-08-13 ENCOUNTER — Encounter: Payer: Self-pay | Admitting: Physical Therapy

## 2022-08-13 ENCOUNTER — Ambulatory Visit: Payer: Medicare HMO | Admitting: Occupational Therapy

## 2022-08-13 ENCOUNTER — Ambulatory Visit: Payer: Medicare HMO | Admitting: Physical Therapy

## 2022-08-13 DIAGNOSIS — R2681 Unsteadiness on feet: Secondary | ICD-10-CM

## 2022-08-13 DIAGNOSIS — R269 Unspecified abnormalities of gait and mobility: Secondary | ICD-10-CM

## 2022-08-13 DIAGNOSIS — R262 Difficulty in walking, not elsewhere classified: Secondary | ICD-10-CM

## 2022-08-13 DIAGNOSIS — M6281 Muscle weakness (generalized): Secondary | ICD-10-CM | POA: Diagnosis not present

## 2022-08-13 DIAGNOSIS — R278 Other lack of coordination: Secondary | ICD-10-CM | POA: Diagnosis not present
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
## 2022-08-14 ENCOUNTER — Ambulatory Visit: Payer: Medicare HMO | Admitting: Occupational Therapy

## 2022-08-14 DIAGNOSIS — R269 Unspecified abnormalities of gait and mobility: Secondary | ICD-10-CM | POA: Diagnosis not present

## 2022-08-14 DIAGNOSIS — R262 Difficulty in walking, not elsewhere classified: Secondary | ICD-10-CM | POA: Diagnosis not present

## 2022-08-14 DIAGNOSIS — M6281 Muscle weakness (generalized): Secondary | ICD-10-CM

## 2022-08-14 DIAGNOSIS — R2681 Unsteadiness on feet: Secondary | ICD-10-CM | POA: Diagnosis not present

## 2022-08-14 DIAGNOSIS — R278 Other lack of coordination: Secondary | ICD-10-CM | POA: Diagnosis not present
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
## 2022-08-15 ENCOUNTER — Ambulatory Visit: Payer: Medicare HMO | Admitting: Occupational Therapy

## 2022-08-15 DIAGNOSIS — M6281 Muscle weakness (generalized): Secondary | ICD-10-CM | POA: Diagnosis not present

## 2022-08-15 DIAGNOSIS — R278 Other lack of coordination: Secondary | ICD-10-CM

## 2022-08-15 DIAGNOSIS — R2681 Unsteadiness on feet: Secondary | ICD-10-CM | POA: Diagnosis not present

## 2022-08-15 DIAGNOSIS — R269 Unspecified abnormalities of gait and mobility: Secondary | ICD-10-CM | POA: Diagnosis not present

## 2022-08-15 DIAGNOSIS — R262 Difficulty in walking, not elsewhere classified: Secondary | ICD-10-CM | POA: Diagnosis not present
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
## 2022-08-20 ENCOUNTER — Ambulatory Visit: Payer: Medicare HMO | Admitting: Physical Therapy

## 2022-08-20 ENCOUNTER — Ambulatory Visit: Payer: Medicare HMO | Admitting: Occupational Therapy

## 2022-08-20 DIAGNOSIS — M6281 Muscle weakness (generalized): Secondary | ICD-10-CM

## 2022-08-20 DIAGNOSIS — R269 Unspecified abnormalities of gait and mobility: Secondary | ICD-10-CM | POA: Diagnosis not present

## 2022-08-20 DIAGNOSIS — R262 Difficulty in walking, not elsewhere classified: Secondary | ICD-10-CM

## 2022-08-20 DIAGNOSIS — R278 Other lack of coordination: Secondary | ICD-10-CM | POA: Diagnosis not present

## 2022-08-20 DIAGNOSIS — R2681 Unsteadiness on feet: Secondary | ICD-10-CM | POA: Diagnosis not present
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
## 2022-08-21 ENCOUNTER — Ambulatory Visit: Payer: Medicare HMO | Admitting: Occupational Therapy

## 2022-08-21 DIAGNOSIS — R269 Unspecified abnormalities of gait and mobility: Secondary | ICD-10-CM | POA: Diagnosis not present

## 2022-08-21 DIAGNOSIS — R278 Other lack of coordination: Secondary | ICD-10-CM | POA: Diagnosis not present

## 2022-08-21 DIAGNOSIS — M6281 Muscle weakness (generalized): Secondary | ICD-10-CM

## 2022-08-21 DIAGNOSIS — R2681 Unsteadiness on feet: Secondary | ICD-10-CM | POA: Diagnosis not present

## 2022-08-21 DIAGNOSIS — R262 Difficulty in walking, not elsewhere classified: Secondary | ICD-10-CM | POA: Diagnosis not present
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
## 2022-08-22 ENCOUNTER — Ambulatory Visit: Payer: Medicare HMO | Admitting: Occupational Therapy

## 2022-08-22 DIAGNOSIS — R2681 Unsteadiness on feet: Secondary | ICD-10-CM | POA: Diagnosis not present

## 2022-08-22 DIAGNOSIS — R278 Other lack of coordination: Secondary | ICD-10-CM | POA: Diagnosis not present

## 2022-08-22 DIAGNOSIS — M6281 Muscle weakness (generalized): Secondary | ICD-10-CM

## 2022-08-22 DIAGNOSIS — R262 Difficulty in walking, not elsewhere classified: Secondary | ICD-10-CM | POA: Diagnosis not present

## 2022-08-22 DIAGNOSIS — R269 Unspecified abnormalities of gait and mobility: Secondary | ICD-10-CM | POA: Diagnosis not present
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
## 2022-08-27 ENCOUNTER — Ambulatory Visit: Payer: Medicare HMO | Admitting: Physical Therapy

## 2022-08-27 ENCOUNTER — Ambulatory Visit: Payer: Medicare HMO | Admitting: Occupational Therapy

## 2022-08-27 DIAGNOSIS — M6281 Muscle weakness (generalized): Secondary | ICD-10-CM | POA: Diagnosis not present

## 2022-08-27 DIAGNOSIS — R2681 Unsteadiness on feet: Secondary | ICD-10-CM | POA: Diagnosis not present

## 2022-08-27 DIAGNOSIS — R278 Other lack of coordination: Secondary | ICD-10-CM | POA: Diagnosis not present

## 2022-08-27 DIAGNOSIS — R269 Unspecified abnormalities of gait and mobility: Secondary | ICD-10-CM | POA: Diagnosis not present

## 2022-08-27 DIAGNOSIS — R262 Difficulty in walking, not elsewhere classified: Secondary | ICD-10-CM | POA: Diagnosis not present
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
## 2022-08-28 ENCOUNTER — Ambulatory Visit: Payer: Medicare HMO | Admitting: Occupational Therapy

## 2022-08-28 DIAGNOSIS — M6281 Muscle weakness (generalized): Secondary | ICD-10-CM

## 2022-08-28 DIAGNOSIS — R278 Other lack of coordination: Secondary | ICD-10-CM | POA: Diagnosis not present

## 2022-08-28 DIAGNOSIS — R2681 Unsteadiness on feet: Secondary | ICD-10-CM | POA: Diagnosis not present

## 2022-08-28 DIAGNOSIS — R269 Unspecified abnormalities of gait and mobility: Secondary | ICD-10-CM | POA: Diagnosis not present

## 2022-08-28 DIAGNOSIS — R262 Difficulty in walking, not elsewhere classified: Secondary | ICD-10-CM | POA: Diagnosis not present
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
## 2022-09-03 ENCOUNTER — Ambulatory Visit: Payer: Medicare HMO | Admitting: Occupational Therapy

## 2022-09-03 ENCOUNTER — Ambulatory Visit: Payer: Medicare HMO | Admitting: Physical Therapy

## 2022-09-03 DIAGNOSIS — R2681 Unsteadiness on feet: Secondary | ICD-10-CM | POA: Diagnosis not present

## 2022-09-03 DIAGNOSIS — R262 Difficulty in walking, not elsewhere classified: Secondary | ICD-10-CM | POA: Diagnosis not present

## 2022-09-03 DIAGNOSIS — M6281 Muscle weakness (generalized): Secondary | ICD-10-CM | POA: Diagnosis not present

## 2022-09-03 DIAGNOSIS — R278 Other lack of coordination: Secondary | ICD-10-CM

## 2022-09-03 DIAGNOSIS — R269 Unspecified abnormalities of gait and mobility: Secondary | ICD-10-CM | POA: Diagnosis not present
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
## 2022-09-04 ENCOUNTER — Ambulatory Visit: Payer: Medicare HMO | Admitting: Occupational Therapy

## 2022-09-04 DIAGNOSIS — M6281 Muscle weakness (generalized): Secondary | ICD-10-CM

## 2022-09-04 DIAGNOSIS — R2681 Unsteadiness on feet: Secondary | ICD-10-CM | POA: Diagnosis not present

## 2022-09-04 DIAGNOSIS — R262 Difficulty in walking, not elsewhere classified: Secondary | ICD-10-CM | POA: Diagnosis not present

## 2022-09-04 DIAGNOSIS — R278 Other lack of coordination: Secondary | ICD-10-CM | POA: Diagnosis not present

## 2022-09-04 DIAGNOSIS — R269 Unspecified abnormalities of gait and mobility: Secondary | ICD-10-CM | POA: Diagnosis not present
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
## 2022-09-05 ENCOUNTER — Ambulatory Visit: Payer: Medicare HMO | Admitting: Occupational Therapy

## 2022-09-05 DIAGNOSIS — R262 Difficulty in walking, not elsewhere classified: Secondary | ICD-10-CM | POA: Diagnosis not present

## 2022-09-05 DIAGNOSIS — R278 Other lack of coordination: Secondary | ICD-10-CM | POA: Diagnosis not present

## 2022-09-05 DIAGNOSIS — R2681 Unsteadiness on feet: Secondary | ICD-10-CM | POA: Diagnosis not present

## 2022-09-05 DIAGNOSIS — R269 Unspecified abnormalities of gait and mobility: Secondary | ICD-10-CM | POA: Diagnosis not present

## 2022-09-05 DIAGNOSIS — M6281 Muscle weakness (generalized): Secondary | ICD-10-CM | POA: Diagnosis not present
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
## 2022-09-10 ENCOUNTER — Ambulatory Visit: Payer: Medicare HMO | Attending: Physician Assistant | Admitting: Occupational Therapy

## 2022-09-10 ENCOUNTER — Ambulatory Visit: Payer: Medicare HMO | Admitting: Physical Therapy

## 2022-09-10 DIAGNOSIS — M6281 Muscle weakness (generalized): Secondary | ICD-10-CM | POA: Insufficient documentation

## 2022-09-10 DIAGNOSIS — R278 Other lack of coordination: Secondary | ICD-10-CM | POA: Insufficient documentation
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
## 2022-09-11 ENCOUNTER — Ambulatory Visit: Payer: Medicare HMO | Admitting: Occupational Therapy

## 2022-09-11 DIAGNOSIS — M6281 Muscle weakness (generalized): Secondary | ICD-10-CM | POA: Diagnosis not present

## 2022-09-11 DIAGNOSIS — R278 Other lack of coordination: Secondary | ICD-10-CM | POA: Diagnosis not present
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
## 2022-09-12 ENCOUNTER — Ambulatory Visit: Payer: Medicare HMO | Admitting: Occupational Therapy

## 2022-09-12 DIAGNOSIS — R278 Other lack of coordination: Secondary | ICD-10-CM | POA: Diagnosis not present

## 2022-09-12 DIAGNOSIS — M6281 Muscle weakness (generalized): Secondary | ICD-10-CM
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
## 2022-09-17 ENCOUNTER — Ambulatory Visit: Payer: Medicare HMO | Admitting: Physical Therapy

## 2022-09-17 ENCOUNTER — Ambulatory Visit: Payer: Medicare HMO | Admitting: Occupational Therapy

## 2022-09-17 DIAGNOSIS — R278 Other lack of coordination: Secondary | ICD-10-CM

## 2022-09-17 DIAGNOSIS — M6281 Muscle weakness (generalized): Secondary | ICD-10-CM
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
## 2022-09-18 ENCOUNTER — Ambulatory Visit: Payer: Medicare HMO | Admitting: Occupational Therapy

## 2022-09-18 DIAGNOSIS — R278 Other lack of coordination: Secondary | ICD-10-CM

## 2022-09-18 DIAGNOSIS — M6281 Muscle weakness (generalized): Secondary | ICD-10-CM
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
## 2022-09-19 ENCOUNTER — Ambulatory Visit: Payer: Medicare HMO | Admitting: Occupational Therapy

## 2022-09-19 DIAGNOSIS — M6281 Muscle weakness (generalized): Secondary | ICD-10-CM

## 2022-09-19 DIAGNOSIS — R278 Other lack of coordination: Secondary | ICD-10-CM | POA: Diagnosis not present
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
## 2022-09-24 ENCOUNTER — Ambulatory Visit: Payer: Medicare HMO | Admitting: Occupational Therapy

## 2022-09-24 ENCOUNTER — Ambulatory Visit: Payer: Medicare HMO | Admitting: Physical Therapy

## 2022-09-24 DIAGNOSIS — R278 Other lack of coordination: Secondary | ICD-10-CM | POA: Diagnosis not present

## 2022-09-24 DIAGNOSIS — M6281 Muscle weakness (generalized): Secondary | ICD-10-CM
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
## 2022-09-25 ENCOUNTER — Encounter: Payer: Self-pay | Admitting: Occupational Therapy

## 2022-09-25 ENCOUNTER — Ambulatory Visit: Payer: Medicare HMO | Admitting: Occupational Therapy

## 2022-09-25 DIAGNOSIS — R278 Other lack of coordination: Secondary | ICD-10-CM | POA: Diagnosis not present

## 2022-09-25 DIAGNOSIS — M6281 Muscle weakness (generalized): Secondary | ICD-10-CM | POA: Diagnosis not present
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
## 2022-09-26 ENCOUNTER — Ambulatory Visit: Payer: Medicare HMO | Admitting: Occupational Therapy

## 2022-09-26 ENCOUNTER — Encounter: Payer: Self-pay | Admitting: Occupational Therapy

## 2022-09-26 DIAGNOSIS — R278 Other lack of coordination: Secondary | ICD-10-CM

## 2022-09-26 DIAGNOSIS — M6281 Muscle weakness (generalized): Secondary | ICD-10-CM | POA: Diagnosis not present
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
## 2022-10-02 ENCOUNTER — Ambulatory Visit: Payer: Medicare HMO | Admitting: Occupational Therapy

## 2022-10-02 DIAGNOSIS — M6281 Muscle weakness (generalized): Secondary | ICD-10-CM

## 2022-10-02 DIAGNOSIS — R278 Other lack of coordination: Secondary | ICD-10-CM | POA: Diagnosis not present
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
## 2022-10-03 ENCOUNTER — Ambulatory Visit: Payer: Medicare HMO | Admitting: Occupational Therapy

## 2022-10-03 DIAGNOSIS — M6281 Muscle weakness (generalized): Secondary | ICD-10-CM | POA: Diagnosis not present

## 2022-10-03 DIAGNOSIS — R278 Other lack of coordination: Secondary | ICD-10-CM | POA: Diagnosis not present
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
## 2022-10-08 ENCOUNTER — Ambulatory Visit: Payer: Medicare HMO | Admitting: Physical Therapy

## 2022-10-08 ENCOUNTER — Ambulatory Visit: Payer: Medicare HMO | Admitting: Occupational Therapy

## 2022-10-09 ENCOUNTER — Ambulatory Visit: Payer: Medicare HMO | Attending: Physician Assistant | Admitting: Occupational Therapy

## 2022-10-09 DIAGNOSIS — R278 Other lack of coordination: Secondary | ICD-10-CM | POA: Insufficient documentation

## 2022-10-09 DIAGNOSIS — M6281 Muscle weakness (generalized): Secondary | ICD-10-CM | POA: Insufficient documentation
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
## 2022-10-10 ENCOUNTER — Ambulatory Visit: Payer: Medicare HMO | Admitting: Occupational Therapy

## 2022-10-10 DIAGNOSIS — R278 Other lack of coordination: Secondary | ICD-10-CM | POA: Diagnosis not present

## 2022-10-10 DIAGNOSIS — M6281 Muscle weakness (generalized): Secondary | ICD-10-CM | POA: Diagnosis not present
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
## 2022-10-15 ENCOUNTER — Ambulatory Visit: Payer: Medicare HMO | Admitting: Physical Therapy

## 2022-10-15 ENCOUNTER — Ambulatory Visit: Payer: Medicare HMO | Admitting: Occupational Therapy

## 2022-10-15 DIAGNOSIS — M6281 Muscle weakness (generalized): Secondary | ICD-10-CM

## 2022-10-15 DIAGNOSIS — R278 Other lack of coordination: Secondary | ICD-10-CM | POA: Diagnosis not present

## 2022-10-16 ENCOUNTER — Ambulatory Visit: Payer: Medicare HMO | Admitting: Occupational Therapy

## 2022-10-16 DIAGNOSIS — M6281 Muscle weakness (generalized): Secondary | ICD-10-CM | POA: Diagnosis not present

## 2022-10-16 DIAGNOSIS — R278 Other lack of coordination: Secondary | ICD-10-CM | POA: Diagnosis not present
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
## 2022-10-17 ENCOUNTER — Ambulatory Visit: Payer: Medicare HMO | Admitting: Occupational Therapy

## 2022-10-17 DIAGNOSIS — M6281 Muscle weakness (generalized): Secondary | ICD-10-CM | POA: Diagnosis not present

## 2022-10-17 DIAGNOSIS — R278 Other lack of coordination: Secondary | ICD-10-CM | POA: Diagnosis not present
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
## 2022-10-21 ENCOUNTER — Other Ambulatory Visit: Payer: Self-pay | Admitting: Nurse Practitioner

## 2022-10-21 MED ORDER — AMOXICILLIN-POT CLAVULANATE 875-125 MG PO TABS: 1.0000 | ORAL_TABLET | Freq: Two times a day (BID) | ORAL | 0 refills | Status: AC

## 2022-10-22 ENCOUNTER — Ambulatory Visit: Payer: Medicare HMO | Admitting: Occupational Therapy

## 2022-10-22 ENCOUNTER — Ambulatory Visit: Payer: Medicare HMO | Admitting: Physical Therapy

## 2022-10-23 ENCOUNTER — Ambulatory Visit: Payer: Medicare HMO | Admitting: Occupational Therapy

## 2022-10-24 ENCOUNTER — Ambulatory Visit: Payer: Medicare HMO | Admitting: Occupational Therapy

## 2022-10-29 ENCOUNTER — Ambulatory Visit: Payer: Medicare HMO | Admitting: Occupational Therapy

## 2022-10-29 ENCOUNTER — Ambulatory Visit: Payer: Medicare HMO | Admitting: Physical Therapy

## 2022-10-29 DIAGNOSIS — M6281 Muscle weakness (generalized): Secondary | ICD-10-CM | POA: Diagnosis not present

## 2022-10-29 DIAGNOSIS — R278 Other lack of coordination: Secondary | ICD-10-CM | POA: Diagnosis not present
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
## 2022-10-30 ENCOUNTER — Ambulatory Visit: Payer: Medicare HMO | Admitting: Occupational Therapy

## 2022-10-30 DIAGNOSIS — M6281 Muscle weakness (generalized): Secondary | ICD-10-CM | POA: Diagnosis not present

## 2022-10-30 DIAGNOSIS — R278 Other lack of coordination: Secondary | ICD-10-CM | POA: Diagnosis not present
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
## 2022-10-31 ENCOUNTER — Ambulatory Visit: Payer: Medicare HMO | Admitting: Occupational Therapy

## 2022-10-31 DIAGNOSIS — M6281 Muscle weakness (generalized): Secondary | ICD-10-CM

## 2022-10-31 DIAGNOSIS — R278 Other lack of coordination: Secondary | ICD-10-CM | POA: Diagnosis not present
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
## 2022-11-05 ENCOUNTER — Ambulatory Visit: Payer: Medicare HMO | Admitting: Physical Therapy

## 2022-11-05 ENCOUNTER — Ambulatory Visit: Payer: Medicare HMO | Admitting: Occupational Therapy

## 2022-11-05 DIAGNOSIS — M6281 Muscle weakness (generalized): Secondary | ICD-10-CM

## 2022-11-05 DIAGNOSIS — R278 Other lack of coordination: Secondary | ICD-10-CM | POA: Diagnosis not present
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
## 2022-11-06 ENCOUNTER — Ambulatory Visit: Payer: Medicare HMO | Admitting: Occupational Therapy

## 2022-11-06 DIAGNOSIS — M6281 Muscle weakness (generalized): Secondary | ICD-10-CM

## 2022-11-06 DIAGNOSIS — R278 Other lack of coordination: Secondary | ICD-10-CM | POA: Diagnosis not present
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
## 2022-11-07 ENCOUNTER — Ambulatory Visit: Payer: Medicare HMO | Attending: Physician Assistant | Admitting: Occupational Therapy

## 2022-11-07 DIAGNOSIS — R278 Other lack of coordination: Secondary | ICD-10-CM | POA: Diagnosis not present

## 2022-11-07 DIAGNOSIS — M6281 Muscle weakness (generalized): Secondary | ICD-10-CM | POA: Diagnosis not present
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
## 2022-11-11 ENCOUNTER — Ambulatory Visit: Payer: Medicare HMO | Admitting: Nurse Practitioner

## 2022-11-12 ENCOUNTER — Ambulatory Visit: Payer: Medicare HMO | Admitting: Physical Therapy

## 2022-11-12 ENCOUNTER — Ambulatory Visit: Payer: Medicare HMO | Admitting: Occupational Therapy

## 2022-11-12 DIAGNOSIS — R278 Other lack of coordination: Secondary | ICD-10-CM | POA: Diagnosis not present

## 2022-11-12 DIAGNOSIS — M6281 Muscle weakness (generalized): Secondary | ICD-10-CM | POA: Diagnosis not present
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
## 2022-11-13 ENCOUNTER — Ambulatory Visit: Payer: Medicare HMO | Admitting: Occupational Therapy

## 2022-11-13 IMAGING — MR MR MRA HEAD W/O CM
12 of 14 series · 34 of 48 positions shown · non-contrast
Comparison: None.

CLINICAL DATA: Left hemiplegia and left facial droop

EXAM:
MRI HEAD WITHOUT CONTRAST
MRA HEAD WITHOUT CONTRAST
TECHNIQUE: Multiplanar, multiecho pulse sequences of the brain and surrounding
structures were obtained without intravenous contrast. Angiographic
images of the head were obtained using MRA technique without
contrast.

[Series 5: DWI · axial · 3.0mm · 0.88mm/px · z∈[-35,+96]mm · 6 of 100 slices shown (1 of 4)]
[im 1/100]
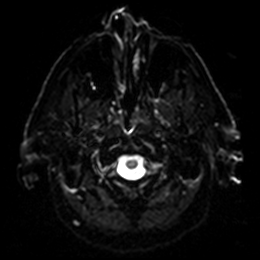
[im 20/100]
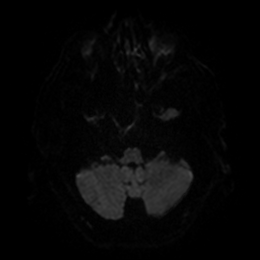
[im 40/100]
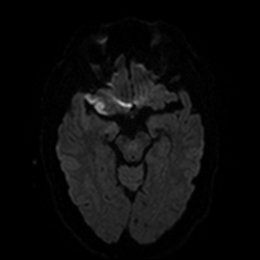
[im 60/100]
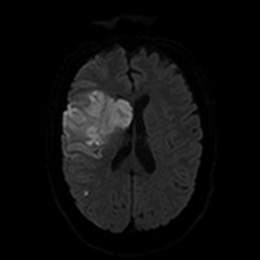
[im 80/100]
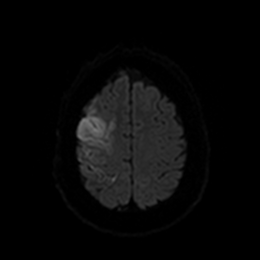
[im 100/100]
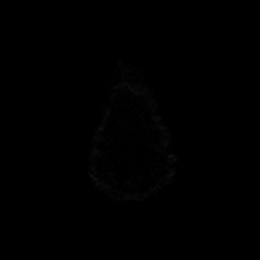

[Series 6: DWI · axial · 3.0mm · 0.88mm/px · z∈[-35,+96]mm · 3 of 50 slices shown (2 of 4)]
[im 1/50]
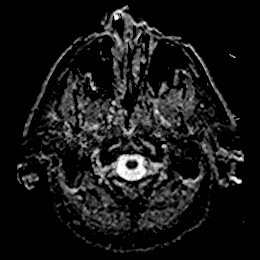
[im 25/50]
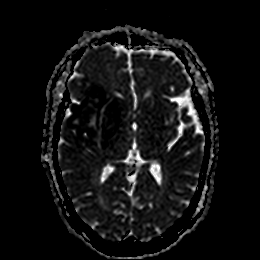
[im 50/50]
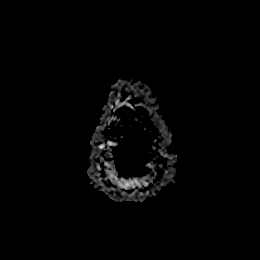

[Series 7: DWI · coronal · 4.0mm · 0.88mm/px · 4 of 70 slices shown (3 of 4)]
[im 1/70]
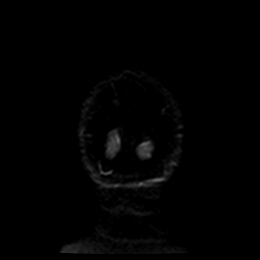
[im 24/70]
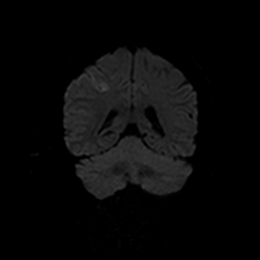
[im 47/70]
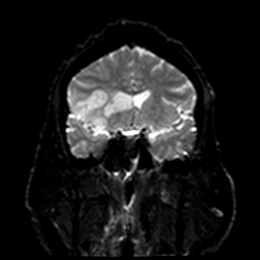
[im 70/70]
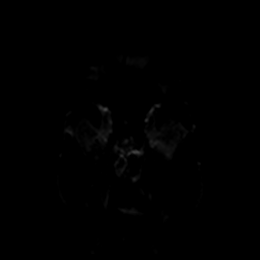

[Series 8: DWI · coronal · 4.0mm · 0.88mm/px · 2 of 35 slices shown (4 of 4)]
[im 1/35]
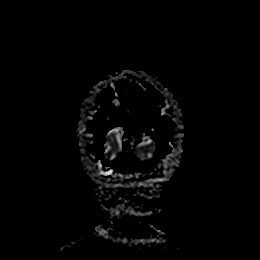
[im 35/35]
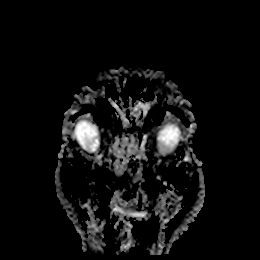

[Series 13: T1 · sagittal · 5.0mm · 0.75mm/px · 1 of 23 slices shown]
[im 1/23]
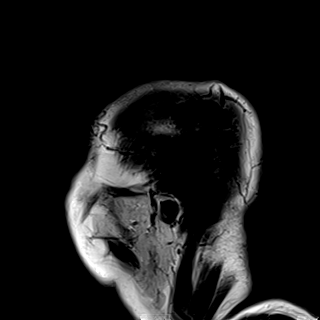

[Series 14: T2 · axial · 5.0mm · 0.72mm/px · z∈[-34,+95]mm · 2 of 25 slices shown (1 of 2)]
[im 1/25]
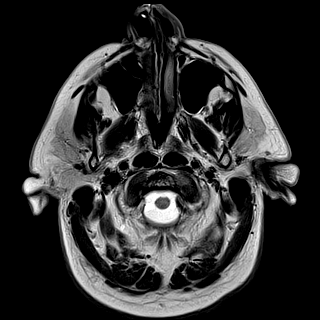
[im 25/25]
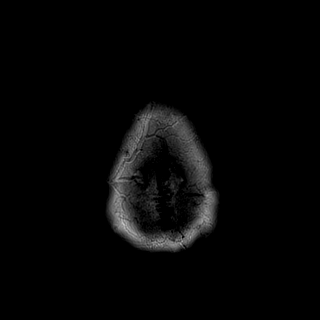

[Series 15: FLAIR · axial · 5.0mm · 0.45mm/px · z∈[-33,+96]mm · 2 of 25 slices shown]
[im 1/25]
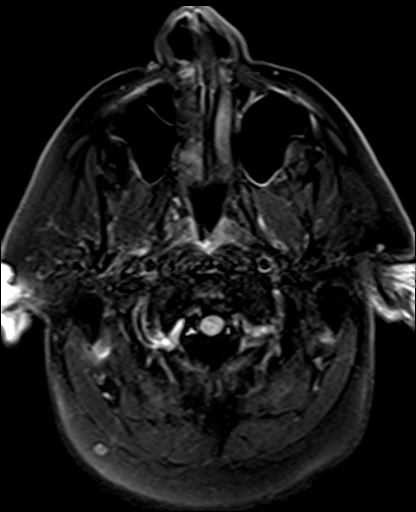
[im 25/25]
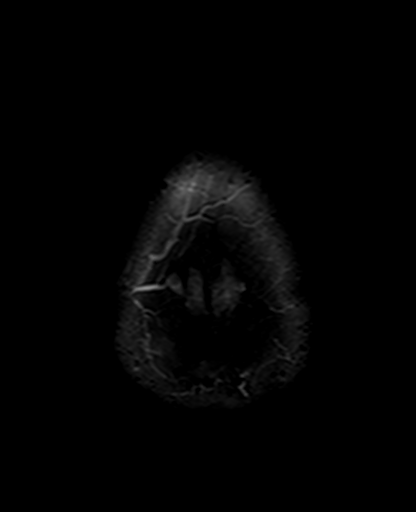

[Series 16: mag_images · axial · 3.0mm · 0.90mm/px · z∈[-37,+100]mm · 3 of 52 slices shown]
[im 1/52]
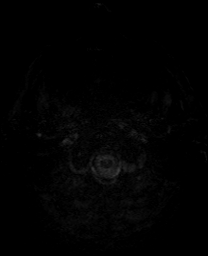
[im 26/52]
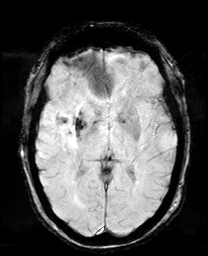
[im 52/52]
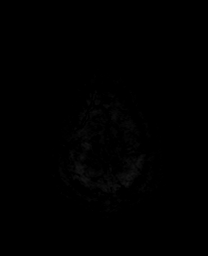

[Series 17: pha_images · axial · 3.0mm · 0.90mm/px · z∈[-37,+100]mm · 3 of 52 slices shown]
[im 1/52]
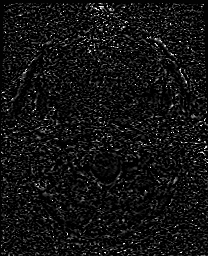
[im 26/52]
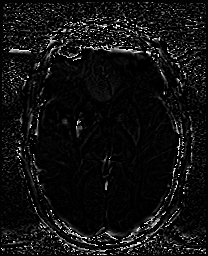
[im 52/52]
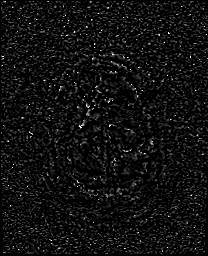

[Series 18: swi_images · axial · 3.0mm · 0.90mm/px · z∈[-37,+100]mm · 3 of 52 slices shown]
[im 1/52]
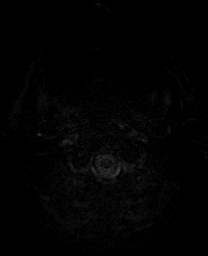
[im 26/52]
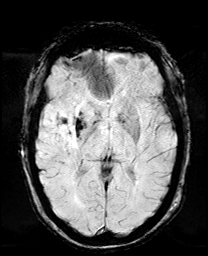
[im 52/52]
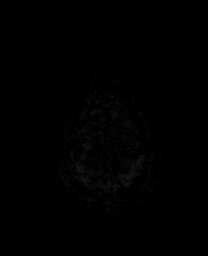

[Series 19: mip_images(sw) · axial · 24.0mm · 0.90mm/px · z∈[-28,+90]mm · 3 of 45 slices shown]
[im 1/45]
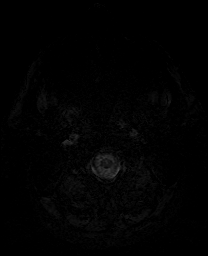
[im 23/45]
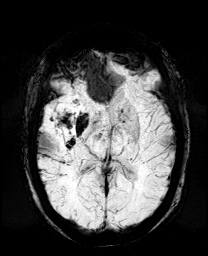
[im 45/45]
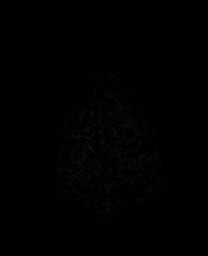

[Series 21: T2 · coronal · 5.0mm · 0.34mm/px · 2 of 29 slices shown (2 of 2)]
[im 1/29]
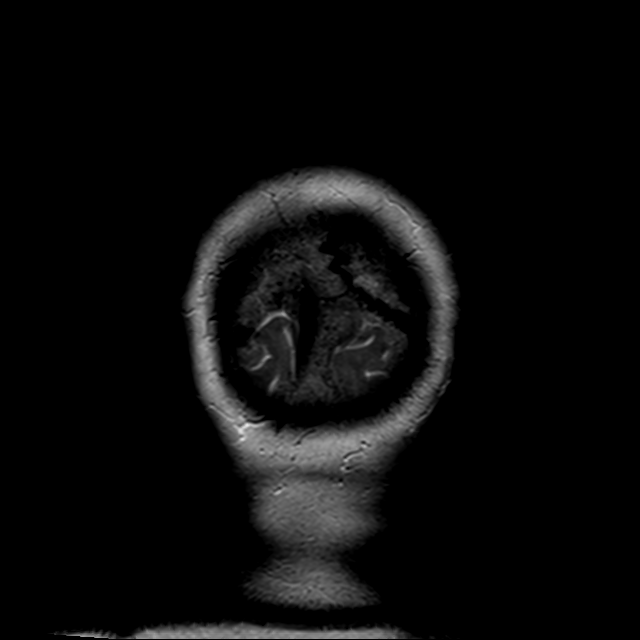
[im 29/29]
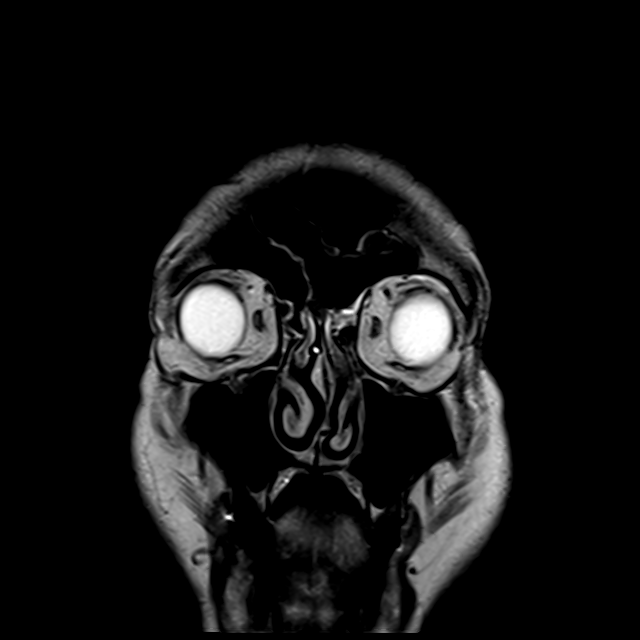

[34 of 48 positions shown; findings below may reference images not displayed]

FINDINGS: MRI HEAD FINDINGS

Brain: Large acute infarct of the right MCA territory involving the
frontal operculum, insula and basal ganglia. There are punctate foci
of acute ischemia in the right temporal lobe and right thalamus.

Vascular: Major flow voids are preserved.

Skull and upper cervical spine: Normal calvarium and skull base.
Visualized upper cervical spine and soft tissues are normal.

Sinuses/Orbits:No paranasal sinus fluid levels or advanced mucosal
thickening. No mastoid or middle ear effusion. Normal orbits.

MRA HEAD FINDINGS

POSTERIOR CIRCULATION:

--Vertebral arteries: Normal

--Inferior cerebellar arteries: Normal.

--Basilar artery: Normal.

--Superior cerebellar arteries: Normal.

--Posterior cerebral arteries: Normal.

ANTERIOR CIRCULATION:

--Intracranial internal carotid arteries: Normal.

--Anterior cerebral arteries (ACA): Normal.

--Middle cerebral arteries (MCA): Normal.

ANATOMIC VARIANTS: None
IMPRESSION: 1. Large acute infarct of the right MCA territory involving the
frontal operculum, insula and basal ganglia. No hemorrhage or mass
effect.
2. Punctate foci of acute ischemia in the right temporal lobe and
right thalamus.
3. Normal intracranial MRA.

## 2022-11-14 ENCOUNTER — Ambulatory Visit: Payer: Medicare HMO | Admitting: Occupational Therapy

## 2022-11-14 DIAGNOSIS — M6281 Muscle weakness (generalized): Secondary | ICD-10-CM | POA: Diagnosis not present

## 2022-11-14 DIAGNOSIS — R278 Other lack of coordination: Secondary | ICD-10-CM | POA: Diagnosis not present
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
## 2022-11-19 ENCOUNTER — Ambulatory Visit: Payer: Medicare HMO | Admitting: Physical Therapy

## 2022-11-19 ENCOUNTER — Ambulatory Visit: Payer: Medicare HMO | Admitting: Occupational Therapy

## 2022-11-19 DIAGNOSIS — R278 Other lack of coordination: Secondary | ICD-10-CM

## 2022-11-19 DIAGNOSIS — M6281 Muscle weakness (generalized): Secondary | ICD-10-CM | POA: Diagnosis not present
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
## 2022-11-20 ENCOUNTER — Ambulatory Visit: Payer: Medicare HMO | Admitting: Occupational Therapy

## 2022-11-21 ENCOUNTER — Ambulatory Visit: Payer: Medicare HMO | Admitting: Occupational Therapy

## 2022-11-21 DIAGNOSIS — M6281 Muscle weakness (generalized): Secondary | ICD-10-CM | POA: Diagnosis not present

## 2022-11-21 DIAGNOSIS — R278 Other lack of coordination: Secondary | ICD-10-CM | POA: Diagnosis not present
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
## 2022-11-26 ENCOUNTER — Ambulatory Visit: Payer: Medicare HMO | Admitting: Occupational Therapy

## 2022-11-26 ENCOUNTER — Ambulatory Visit: Payer: Medicare HMO | Admitting: Physical Therapy

## 2022-11-26 DIAGNOSIS — R278 Other lack of coordination: Secondary | ICD-10-CM | POA: Diagnosis not present

## 2022-11-26 DIAGNOSIS — M6281 Muscle weakness (generalized): Secondary | ICD-10-CM | POA: Diagnosis not present
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
## 2022-11-27 ENCOUNTER — Ambulatory Visit: Payer: Medicare HMO | Admitting: Occupational Therapy

## 2022-11-28 ENCOUNTER — Ambulatory Visit: Payer: Medicare HMO | Admitting: Occupational Therapy

## 2022-11-28 DIAGNOSIS — M6281 Muscle weakness (generalized): Secondary | ICD-10-CM | POA: Diagnosis not present

## 2022-11-28 DIAGNOSIS — R278 Other lack of coordination: Secondary | ICD-10-CM | POA: Diagnosis not present
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
## 2022-12-03 ENCOUNTER — Ambulatory Visit: Payer: Medicare HMO | Admitting: Physical Therapy

## 2022-12-03 ENCOUNTER — Ambulatory Visit: Payer: Medicare HMO | Admitting: Occupational Therapy

## 2022-12-03 DIAGNOSIS — M6281 Muscle weakness (generalized): Secondary | ICD-10-CM | POA: Diagnosis not present

## 2022-12-03 DIAGNOSIS — R278 Other lack of coordination: Secondary | ICD-10-CM
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
## 2022-12-04 ENCOUNTER — Ambulatory Visit: Payer: Medicare HMO | Admitting: Occupational Therapy

## 2022-12-05 ENCOUNTER — Ambulatory Visit: Payer: Medicare HMO | Admitting: Occupational Therapy

## 2022-12-05 DIAGNOSIS — R278 Other lack of coordination: Secondary | ICD-10-CM

## 2022-12-05 DIAGNOSIS — M6281 Muscle weakness (generalized): Secondary | ICD-10-CM
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
## 2022-12-10 ENCOUNTER — Ambulatory Visit: Payer: Medicare HMO | Admitting: Physical Therapy

## 2022-12-10 ENCOUNTER — Ambulatory Visit: Payer: Medicare HMO | Attending: Nurse Practitioner | Admitting: Occupational Therapy

## 2022-12-10 DIAGNOSIS — M6281 Muscle weakness (generalized): Secondary | ICD-10-CM | POA: Diagnosis not present
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
## 2022-12-11 ENCOUNTER — Ambulatory Visit: Payer: Medicare HMO | Admitting: Occupational Therapy

## 2022-12-12 ENCOUNTER — Ambulatory Visit: Payer: Medicare HMO | Admitting: Occupational Therapy

## 2022-12-12 DIAGNOSIS — M6281 Muscle weakness (generalized): Secondary | ICD-10-CM
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
## 2022-12-17 ENCOUNTER — Ambulatory Visit: Payer: Medicare HMO | Admitting: Physical Therapy

## 2022-12-17 ENCOUNTER — Ambulatory Visit: Payer: Medicare HMO | Admitting: Occupational Therapy

## 2022-12-17 DIAGNOSIS — M6281 Muscle weakness (generalized): Secondary | ICD-10-CM
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
## 2022-12-18 ENCOUNTER — Ambulatory Visit: Payer: Medicare HMO | Admitting: Occupational Therapy

## 2022-12-19 ENCOUNTER — Ambulatory Visit: Payer: Medicare HMO | Admitting: Occupational Therapy

## 2022-12-19 DIAGNOSIS — M6281 Muscle weakness (generalized): Secondary | ICD-10-CM | POA: Diagnosis not present
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
## 2022-12-24 ENCOUNTER — Ambulatory Visit: Payer: Medicare HMO | Admitting: Physical Therapy

## 2022-12-24 ENCOUNTER — Ambulatory Visit: Payer: Medicare HMO | Admitting: Occupational Therapy

## 2022-12-24 DIAGNOSIS — M6281 Muscle weakness (generalized): Secondary | ICD-10-CM
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
## 2022-12-25 ENCOUNTER — Ambulatory Visit: Payer: Medicare HMO | Admitting: Occupational Therapy

## 2022-12-26 ENCOUNTER — Ambulatory Visit: Payer: Medicare HMO | Admitting: Occupational Therapy

## 2022-12-26 DIAGNOSIS — M6281 Muscle weakness (generalized): Secondary | ICD-10-CM | POA: Diagnosis not present
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
## 2022-12-31 ENCOUNTER — Ambulatory Visit: Payer: Medicare HMO | Admitting: Occupational Therapy

## 2022-12-31 ENCOUNTER — Ambulatory Visit: Payer: Medicare HMO | Admitting: Physical Therapy

## 2022-12-31 DIAGNOSIS — M6281 Muscle weakness (generalized): Secondary | ICD-10-CM | POA: Diagnosis not present
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
## 2023-01-01 ENCOUNTER — Encounter: Payer: Medicare HMO | Admitting: Occupational Therapy

## 2023-01-02 ENCOUNTER — Ambulatory Visit: Payer: Medicare HMO | Admitting: Occupational Therapy

## 2023-01-02 DIAGNOSIS — M6281 Muscle weakness (generalized): Secondary | ICD-10-CM
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
## 2023-01-07 ENCOUNTER — Ambulatory Visit: Payer: Medicare HMO | Attending: Physician Assistant | Admitting: Occupational Therapy

## 2023-01-07 ENCOUNTER — Ambulatory Visit: Payer: Medicare HMO | Admitting: Physical Therapy

## 2023-01-07 DIAGNOSIS — I63511 Cerebral infarction due to unspecified occlusion or stenosis of right middle cerebral artery: Secondary | ICD-10-CM | POA: Insufficient documentation

## 2023-01-07 DIAGNOSIS — R482 Apraxia: Secondary | ICD-10-CM | POA: Diagnosis not present

## 2023-01-07 DIAGNOSIS — M6281 Muscle weakness (generalized): Secondary | ICD-10-CM | POA: Insufficient documentation

## 2023-01-07 DIAGNOSIS — R278 Other lack of coordination: Secondary | ICD-10-CM | POA: Diagnosis not present
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
## 2023-01-08 ENCOUNTER — Encounter: Payer: Medicare HMO | Admitting: Occupational Therapy

## 2023-01-09 ENCOUNTER — Ambulatory Visit: Payer: Medicare HMO | Admitting: Occupational Therapy

## 2023-01-09 DIAGNOSIS — R278 Other lack of coordination: Secondary | ICD-10-CM

## 2023-01-09 DIAGNOSIS — M6281 Muscle weakness (generalized): Secondary | ICD-10-CM

## 2023-01-09 DIAGNOSIS — R482 Apraxia: Secondary | ICD-10-CM | POA: Diagnosis not present

## 2023-01-09 DIAGNOSIS — I63511 Cerebral infarction due to unspecified occlusion or stenosis of right middle cerebral artery: Secondary | ICD-10-CM | POA: Diagnosis not present
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
## 2023-01-14 ENCOUNTER — Ambulatory Visit: Payer: Medicare HMO | Admitting: Occupational Therapy

## 2023-01-14 ENCOUNTER — Ambulatory Visit: Payer: Medicare HMO | Admitting: Physical Therapy

## 2023-01-14 DIAGNOSIS — R278 Other lack of coordination: Secondary | ICD-10-CM

## 2023-01-14 DIAGNOSIS — M6281 Muscle weakness (generalized): Secondary | ICD-10-CM | POA: Diagnosis not present

## 2023-01-14 DIAGNOSIS — I63511 Cerebral infarction due to unspecified occlusion or stenosis of right middle cerebral artery: Secondary | ICD-10-CM | POA: Diagnosis not present

## 2023-01-14 DIAGNOSIS — R482 Apraxia: Secondary | ICD-10-CM | POA: Diagnosis not present
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
## 2023-01-15 ENCOUNTER — Encounter: Payer: Medicare HMO | Admitting: Occupational Therapy

## 2023-01-16 ENCOUNTER — Ambulatory Visit: Payer: Medicare HMO | Admitting: Occupational Therapy

## 2023-01-16 DIAGNOSIS — I63511 Cerebral infarction due to unspecified occlusion or stenosis of right middle cerebral artery: Secondary | ICD-10-CM | POA: Diagnosis not present

## 2023-01-16 DIAGNOSIS — M6281 Muscle weakness (generalized): Secondary | ICD-10-CM

## 2023-01-16 DIAGNOSIS — R278 Other lack of coordination: Secondary | ICD-10-CM

## 2023-01-16 DIAGNOSIS — R482 Apraxia: Secondary | ICD-10-CM | POA: Diagnosis not present
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
## 2023-01-21 ENCOUNTER — Ambulatory Visit: Payer: Medicare HMO | Admitting: Physical Therapy

## 2023-01-21 ENCOUNTER — Encounter: Payer: Self-pay | Admitting: Occupational Therapy

## 2023-01-21 ENCOUNTER — Ambulatory Visit: Payer: Medicare HMO | Admitting: Occupational Therapy

## 2023-01-21 DIAGNOSIS — R482 Apraxia: Secondary | ICD-10-CM | POA: Diagnosis not present

## 2023-01-21 DIAGNOSIS — M6281 Muscle weakness (generalized): Secondary | ICD-10-CM | POA: Diagnosis not present

## 2023-01-21 DIAGNOSIS — I63511 Cerebral infarction due to unspecified occlusion or stenosis of right middle cerebral artery: Secondary | ICD-10-CM | POA: Diagnosis not present

## 2023-01-21 DIAGNOSIS — R278 Other lack of coordination: Secondary | ICD-10-CM
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
## 2023-01-22 ENCOUNTER — Encounter: Payer: Medicare HMO | Admitting: Occupational Therapy

## 2023-01-23 ENCOUNTER — Ambulatory Visit: Payer: Medicare HMO

## 2023-01-23 DIAGNOSIS — R278 Other lack of coordination: Secondary | ICD-10-CM

## 2023-01-23 DIAGNOSIS — M6281 Muscle weakness (generalized): Secondary | ICD-10-CM

## 2023-01-23 DIAGNOSIS — I63511 Cerebral infarction due to unspecified occlusion or stenosis of right middle cerebral artery: Secondary | ICD-10-CM

## 2023-01-23 DIAGNOSIS — R482 Apraxia: Secondary | ICD-10-CM | POA: Diagnosis not present
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
## 2023-01-28 ENCOUNTER — Ambulatory Visit: Payer: Medicare HMO | Admitting: Physical Therapy

## 2023-01-28 ENCOUNTER — Encounter: Payer: Self-pay | Admitting: Occupational Therapy

## 2023-01-28 ENCOUNTER — Ambulatory Visit: Payer: Medicare HMO | Admitting: Occupational Therapy

## 2023-01-28 DIAGNOSIS — I63511 Cerebral infarction due to unspecified occlusion or stenosis of right middle cerebral artery: Secondary | ICD-10-CM

## 2023-01-28 DIAGNOSIS — R482 Apraxia: Secondary | ICD-10-CM | POA: Diagnosis not present

## 2023-01-28 DIAGNOSIS — R278 Other lack of coordination: Secondary | ICD-10-CM | POA: Diagnosis not present

## 2023-01-28 DIAGNOSIS — M6281 Muscle weakness (generalized): Secondary | ICD-10-CM
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
## 2023-01-29 ENCOUNTER — Encounter: Payer: Medicare HMO | Admitting: Occupational Therapy

## 2023-01-30 ENCOUNTER — Ambulatory Visit: Payer: Medicare HMO | Admitting: Occupational Therapy

## 2023-01-30 DIAGNOSIS — R278 Other lack of coordination: Secondary | ICD-10-CM | POA: Diagnosis not present

## 2023-01-30 DIAGNOSIS — M6281 Muscle weakness (generalized): Secondary | ICD-10-CM | POA: Diagnosis not present

## 2023-01-30 DIAGNOSIS — I63511 Cerebral infarction due to unspecified occlusion or stenosis of right middle cerebral artery: Secondary | ICD-10-CM | POA: Diagnosis not present

## 2023-01-30 DIAGNOSIS — R482 Apraxia: Secondary | ICD-10-CM | POA: Diagnosis not present
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
## 2023-02-04 ENCOUNTER — Ambulatory Visit: Payer: Medicare HMO | Admitting: Physical Therapy

## 2023-02-04 ENCOUNTER — Ambulatory Visit: Payer: Medicare HMO | Admitting: Occupational Therapy

## 2023-02-04 DIAGNOSIS — R278 Other lack of coordination: Secondary | ICD-10-CM | POA: Diagnosis not present

## 2023-02-04 DIAGNOSIS — R482 Apraxia: Secondary | ICD-10-CM | POA: Diagnosis not present

## 2023-02-04 DIAGNOSIS — I63511 Cerebral infarction due to unspecified occlusion or stenosis of right middle cerebral artery: Secondary | ICD-10-CM | POA: Diagnosis not present

## 2023-02-04 DIAGNOSIS — M6281 Muscle weakness (generalized): Secondary | ICD-10-CM
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
## 2023-02-05 ENCOUNTER — Encounter: Payer: Medicare HMO | Admitting: Occupational Therapy

## 2023-02-06 ENCOUNTER — Ambulatory Visit: Payer: Medicare HMO | Attending: Physician Assistant | Admitting: Occupational Therapy

## 2023-02-06 DIAGNOSIS — M6281 Muscle weakness (generalized): Secondary | ICD-10-CM | POA: Diagnosis not present

## 2023-02-06 DIAGNOSIS — R278 Other lack of coordination: Secondary | ICD-10-CM | POA: Insufficient documentation
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
## 2023-02-11 ENCOUNTER — Ambulatory Visit: Payer: Medicare HMO | Admitting: Physical Therapy

## 2023-02-11 ENCOUNTER — Ambulatory Visit: Payer: Medicare HMO | Admitting: Occupational Therapy

## 2023-02-11 DIAGNOSIS — R278 Other lack of coordination: Secondary | ICD-10-CM

## 2023-02-11 DIAGNOSIS — M6281 Muscle weakness (generalized): Secondary | ICD-10-CM
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
## 2023-02-12 ENCOUNTER — Encounter: Payer: Medicare HMO | Admitting: Occupational Therapy

## 2023-02-13 ENCOUNTER — Ambulatory Visit: Payer: Medicare HMO | Admitting: Occupational Therapy

## 2023-02-13 DIAGNOSIS — R278 Other lack of coordination: Secondary | ICD-10-CM

## 2023-02-13 DIAGNOSIS — M6281 Muscle weakness (generalized): Secondary | ICD-10-CM
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
## 2023-02-18 ENCOUNTER — Ambulatory Visit: Payer: Medicare HMO | Admitting: Occupational Therapy

## 2023-02-18 ENCOUNTER — Ambulatory Visit: Payer: Medicare HMO | Admitting: Physical Therapy

## 2023-02-18 DIAGNOSIS — M6281 Muscle weakness (generalized): Secondary | ICD-10-CM | POA: Diagnosis not present

## 2023-02-18 DIAGNOSIS — R278 Other lack of coordination: Secondary | ICD-10-CM | POA: Diagnosis not present
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
## 2023-02-19 ENCOUNTER — Encounter: Payer: Medicare HMO | Admitting: Occupational Therapy

## 2023-02-20 ENCOUNTER — Ambulatory Visit: Payer: Medicare HMO | Admitting: Occupational Therapy

## 2023-02-20 DIAGNOSIS — M6281 Muscle weakness (generalized): Secondary | ICD-10-CM | POA: Diagnosis not present

## 2023-02-20 DIAGNOSIS — R278 Other lack of coordination: Secondary | ICD-10-CM | POA: Diagnosis not present
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
## 2023-02-23 ENCOUNTER — Other Ambulatory Visit: Payer: Self-pay | Admitting: Nurse Practitioner

## 2023-02-23 DIAGNOSIS — K219 Gastro-esophageal reflux disease without esophagitis: Secondary | ICD-10-CM

## 2023-02-25 ENCOUNTER — Ambulatory Visit: Payer: Medicare HMO | Admitting: Physical Therapy

## 2023-02-25 ENCOUNTER — Ambulatory Visit: Payer: Medicare HMO | Admitting: Occupational Therapy

## 2023-02-25 DIAGNOSIS — M6281 Muscle weakness (generalized): Secondary | ICD-10-CM

## 2023-02-25 DIAGNOSIS — R278 Other lack of coordination: Secondary | ICD-10-CM | POA: Diagnosis not present
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
## 2023-02-26 ENCOUNTER — Encounter: Payer: Medicare HMO | Admitting: Occupational Therapy

## 2023-02-27 ENCOUNTER — Ambulatory Visit: Payer: Medicare HMO | Admitting: Occupational Therapy

## 2023-02-27 DIAGNOSIS — R278 Other lack of coordination: Secondary | ICD-10-CM | POA: Diagnosis not present

## 2023-02-27 DIAGNOSIS — M6281 Muscle weakness (generalized): Secondary | ICD-10-CM
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
## 2023-03-04 ENCOUNTER — Ambulatory Visit: Payer: Medicare HMO | Admitting: Physical Therapy

## 2023-03-04 ENCOUNTER — Ambulatory Visit: Payer: Medicare HMO | Admitting: Occupational Therapy

## 2023-03-04 DIAGNOSIS — M6281 Muscle weakness (generalized): Secondary | ICD-10-CM

## 2023-03-04 DIAGNOSIS — R278 Other lack of coordination: Secondary | ICD-10-CM | POA: Diagnosis not present
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
## 2023-03-05 ENCOUNTER — Encounter: Payer: Medicare HMO | Admitting: Occupational Therapy

## 2023-03-06 ENCOUNTER — Ambulatory Visit: Payer: Medicare HMO | Admitting: Occupational Therapy

## 2023-03-06 DIAGNOSIS — R278 Other lack of coordination: Secondary | ICD-10-CM | POA: Diagnosis not present

## 2023-03-06 DIAGNOSIS — M6281 Muscle weakness (generalized): Secondary | ICD-10-CM
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
## 2023-03-11 ENCOUNTER — Ambulatory Visit: Payer: Medicare HMO | Admitting: Physical Therapy

## 2023-03-11 ENCOUNTER — Ambulatory Visit: Payer: Medicare HMO | Attending: Physician Assistant | Admitting: Occupational Therapy

## 2023-03-11 DIAGNOSIS — R278 Other lack of coordination: Secondary | ICD-10-CM | POA: Insufficient documentation

## 2023-03-11 DIAGNOSIS — M6281 Muscle weakness (generalized): Secondary | ICD-10-CM | POA: Insufficient documentation
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
## 2023-03-12 ENCOUNTER — Encounter: Payer: Medicare HMO | Admitting: Occupational Therapy

## 2023-03-13 ENCOUNTER — Ambulatory Visit: Payer: Medicare HMO | Admitting: Occupational Therapy

## 2023-03-13 DIAGNOSIS — M6281 Muscle weakness (generalized): Secondary | ICD-10-CM

## 2023-03-13 DIAGNOSIS — R278 Other lack of coordination: Secondary | ICD-10-CM | POA: Diagnosis not present
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
## 2023-03-18 ENCOUNTER — Ambulatory Visit: Payer: Medicare HMO | Admitting: Physical Therapy

## 2023-03-18 ENCOUNTER — Ambulatory Visit: Payer: Medicare HMO | Admitting: Occupational Therapy

## 2023-03-18 DIAGNOSIS — M6281 Muscle weakness (generalized): Secondary | ICD-10-CM | POA: Diagnosis not present

## 2023-03-18 DIAGNOSIS — R278 Other lack of coordination: Secondary | ICD-10-CM | POA: Diagnosis not present
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
## 2023-03-19 ENCOUNTER — Encounter: Payer: Medicare HMO | Admitting: Occupational Therapy

## 2023-03-20 ENCOUNTER — Ambulatory Visit: Payer: Medicare HMO | Admitting: Occupational Therapy

## 2023-03-25 ENCOUNTER — Ambulatory Visit: Payer: Medicare HMO | Admitting: Physical Therapy

## 2023-03-25 ENCOUNTER — Ambulatory Visit: Payer: Medicare HMO | Admitting: Occupational Therapy

## 2023-03-25 DIAGNOSIS — M6281 Muscle weakness (generalized): Secondary | ICD-10-CM | POA: Diagnosis not present

## 2023-03-25 DIAGNOSIS — R278 Other lack of coordination: Secondary | ICD-10-CM | POA: Diagnosis not present
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
## 2023-03-26 ENCOUNTER — Encounter: Payer: Medicare HMO | Admitting: Occupational Therapy

## 2023-03-27 ENCOUNTER — Ambulatory Visit: Payer: Medicare HMO | Admitting: Occupational Therapy

## 2023-03-27 DIAGNOSIS — R278 Other lack of coordination: Secondary | ICD-10-CM | POA: Diagnosis not present

## 2023-03-27 DIAGNOSIS — M6281 Muscle weakness (generalized): Secondary | ICD-10-CM | POA: Diagnosis not present

## 2023-03-27 NOTE — Addendum Note (Signed)
Addended by: Avon Gully on: 03/27/2023 05:43 PM   Modules accepted: Orders
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
## 2023-03-27 NOTE — Addendum Note (Signed)
Addended by: Avon Gully on: 03/27/2023 05:40 PM   Modules accepted: Orders

## 2023-04-01 ENCOUNTER — Ambulatory Visit: Payer: Medicare HMO | Admitting: Occupational Therapy

## 2023-04-03 ENCOUNTER — Ambulatory Visit: Payer: Medicare HMO | Admitting: Occupational Therapy

## 2023-04-03 DIAGNOSIS — M6281 Muscle weakness (generalized): Secondary | ICD-10-CM | POA: Diagnosis not present

## 2023-04-03 DIAGNOSIS — R278 Other lack of coordination: Secondary | ICD-10-CM | POA: Diagnosis not present
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
## 2023-04-08 ENCOUNTER — Ambulatory Visit: Payer: Medicare HMO | Attending: Physician Assistant | Admitting: Occupational Therapy

## 2023-04-08 DIAGNOSIS — R278 Other lack of coordination: Secondary | ICD-10-CM | POA: Insufficient documentation

## 2023-04-08 DIAGNOSIS — M6281 Muscle weakness (generalized): Secondary | ICD-10-CM | POA: Diagnosis not present
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
## 2023-04-15 ENCOUNTER — Ambulatory Visit: Payer: Medicare HMO | Admitting: Occupational Therapy

## 2023-04-15 DIAGNOSIS — M6281 Muscle weakness (generalized): Secondary | ICD-10-CM | POA: Diagnosis not present

## 2023-04-15 DIAGNOSIS — R278 Other lack of coordination: Secondary | ICD-10-CM | POA: Diagnosis not present
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
## 2023-04-17 ENCOUNTER — Ambulatory Visit: Payer: Medicare HMO | Admitting: Occupational Therapy

## 2023-04-22 ENCOUNTER — Ambulatory Visit: Payer: Medicare HMO | Admitting: Occupational Therapy

## 2023-04-22 DIAGNOSIS — R278 Other lack of coordination: Secondary | ICD-10-CM | POA: Diagnosis not present

## 2023-04-22 DIAGNOSIS — M6281 Muscle weakness (generalized): Secondary | ICD-10-CM
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
## 2023-04-24 ENCOUNTER — Ambulatory Visit: Payer: Medicare HMO | Admitting: Occupational Therapy

## 2023-04-29 ENCOUNTER — Ambulatory Visit: Payer: Medicare HMO | Admitting: Occupational Therapy

## 2023-04-29 DIAGNOSIS — M6281 Muscle weakness (generalized): Secondary | ICD-10-CM | POA: Diagnosis not present

## 2023-04-29 DIAGNOSIS — R278 Other lack of coordination: Secondary | ICD-10-CM
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
## 2023-05-01 ENCOUNTER — Ambulatory Visit: Payer: Medicare HMO | Admitting: Occupational Therapy

## 2023-05-06 ENCOUNTER — Ambulatory Visit: Payer: Medicare HMO | Admitting: Occupational Therapy

## 2023-05-06 DIAGNOSIS — M6281 Muscle weakness (generalized): Secondary | ICD-10-CM

## 2023-05-06 DIAGNOSIS — R278 Other lack of coordination: Secondary | ICD-10-CM | POA: Diagnosis not present

## 2023-05-06 NOTE — Therapy (Addendum)
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   Olegario Messier, MS, OTR/L  05/06/2023

## 2023-05-08 ENCOUNTER — Ambulatory Visit: Payer: Medicare HMO | Admitting: Occupational Therapy

## 2023-05-13 ENCOUNTER — Ambulatory Visit: Payer: Medicare HMO | Attending: Physician Assistant | Admitting: Occupational Therapy

## 2023-05-13 ENCOUNTER — Ambulatory Visit: Payer: Medicare HMO | Admitting: Nurse Practitioner

## 2023-05-13 ENCOUNTER — Encounter: Payer: Self-pay | Admitting: Occupational Therapy

## 2023-05-13 DIAGNOSIS — R278 Other lack of coordination: Secondary | ICD-10-CM | POA: Insufficient documentation

## 2023-05-13 DIAGNOSIS — I63511 Cerebral infarction due to unspecified occlusion or stenosis of right middle cerebral artery: Secondary | ICD-10-CM | POA: Diagnosis not present

## 2023-05-13 DIAGNOSIS — M6281 Muscle weakness (generalized): Secondary | ICD-10-CM | POA: Insufficient documentation
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
## 2023-05-15 ENCOUNTER — Encounter: Payer: Medicare HMO | Admitting: Occupational Therapy

## 2023-05-20 ENCOUNTER — Ambulatory Visit: Payer: Medicare HMO | Admitting: Occupational Therapy

## 2023-05-20 DIAGNOSIS — R278 Other lack of coordination: Secondary | ICD-10-CM | POA: Diagnosis not present

## 2023-05-20 DIAGNOSIS — M6281 Muscle weakness (generalized): Secondary | ICD-10-CM | POA: Diagnosis not present

## 2023-05-20 DIAGNOSIS — I63511 Cerebral infarction due to unspecified occlusion or stenosis of right middle cerebral artery: Secondary | ICD-10-CM | POA: Diagnosis not present
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
## 2023-05-22 ENCOUNTER — Ambulatory Visit: Payer: Medicare HMO | Admitting: Occupational Therapy

## 2023-05-27 ENCOUNTER — Ambulatory Visit: Payer: Medicare HMO | Admitting: Occupational Therapy

## 2023-05-27 DIAGNOSIS — M6281 Muscle weakness (generalized): Secondary | ICD-10-CM | POA: Diagnosis not present

## 2023-05-27 DIAGNOSIS — I63511 Cerebral infarction due to unspecified occlusion or stenosis of right middle cerebral artery: Secondary | ICD-10-CM | POA: Diagnosis not present

## 2023-05-27 DIAGNOSIS — R278 Other lack of coordination: Secondary | ICD-10-CM | POA: Diagnosis not present

## 2023-05-28 DIAGNOSIS — H524 Presbyopia: Secondary | ICD-10-CM | POA: Diagnosis not present

## 2023-05-28 DIAGNOSIS — H259 Unspecified age-related cataract: Secondary | ICD-10-CM | POA: Diagnosis not present

## 2023-05-28 DIAGNOSIS — H52223 Regular astigmatism, bilateral: Secondary | ICD-10-CM | POA: Diagnosis not present

## 2023-05-28 DIAGNOSIS — H5203 Hypermetropia, bilateral: Secondary | ICD-10-CM | POA: Diagnosis not present
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
## 2023-05-29 ENCOUNTER — Ambulatory Visit: Payer: Medicare HMO | Admitting: Occupational Therapy

## 2023-06-03 ENCOUNTER — Ambulatory Visit: Payer: Medicare HMO | Admitting: Occupational Therapy

## 2023-06-03 DIAGNOSIS — M6281 Muscle weakness (generalized): Secondary | ICD-10-CM

## 2023-06-03 DIAGNOSIS — R278 Other lack of coordination: Secondary | ICD-10-CM | POA: Diagnosis not present

## 2023-06-03 DIAGNOSIS — I63511 Cerebral infarction due to unspecified occlusion or stenosis of right middle cerebral artery: Secondary | ICD-10-CM | POA: Diagnosis not present

## 2023-06-03 NOTE — Therapy (Signed)
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    OT Frequency 1x / week    OT Duration 12 weeks    OT Treatment/Interventions Self-care/ADL training;Psychosocial skills training;Neuromuscular education;Patient/family education;Energy conservation;Therapeutic exercise;DME and/or AE instruction;Therapeutic activities    Consulted and Agree with Plan of Care Family member/caregiver;Patient             Olegario Messier, MS, OTR/L  06/03/23, 1:56 PM

## 2023-06-04 ENCOUNTER — Telehealth: Payer: Self-pay | Admitting: Nurse Practitioner

## 2023-06-04 NOTE — Telephone Encounter (Signed)
Called pt to confirm cpe, pt cancelled appointment stating that he just doesn't want to come

## 2023-06-05 ENCOUNTER — Ambulatory Visit: Payer: Medicare HMO | Admitting: Occupational Therapy

## 2023-06-10 ENCOUNTER — Ambulatory Visit: Payer: Medicare HMO | Attending: Physician Assistant | Admitting: Occupational Therapy

## 2023-06-10 DIAGNOSIS — R278 Other lack of coordination: Secondary | ICD-10-CM | POA: Insufficient documentation

## 2023-06-10 DIAGNOSIS — M6281 Muscle weakness (generalized): Secondary | ICD-10-CM | POA: Diagnosis not present

## 2023-06-10 NOTE — Therapy (Signed)
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 training;Psychosocial skills training;Neuromuscular education;Patient/family education;Energy conservation;Therapeutic exercise;DME and/or AE instruction;Therapeutic activities    Consulted and Agree with Plan of Care Family member/caregiver;Patient             Olegario Messier, MS, OTR/L  06/10/23, 10:10 AM

## 2023-06-12 ENCOUNTER — Ambulatory Visit: Payer: Medicare HMO | Admitting: Occupational Therapy

## 2023-06-12 ENCOUNTER — Ambulatory Visit: Payer: Medicare HMO | Admitting: Nurse Practitioner

## 2023-06-17 ENCOUNTER — Ambulatory Visit: Payer: Medicare HMO | Admitting: Occupational Therapy

## 2023-06-17 DIAGNOSIS — M6281 Muscle weakness (generalized): Secondary | ICD-10-CM | POA: Diagnosis not present

## 2023-06-17 DIAGNOSIS — R278 Other lack of coordination: Secondary | ICD-10-CM | POA: Diagnosis not present

## 2023-06-17 NOTE — Therapy (Addendum)
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 related to current functional performance    Occupational performance deficits (Please refer to evaluation for details): ADL's;IADL's    Body Structure / Function / Physical Skills ADL;Coordination;Endurance;GMC;UE functional use;Balance;Sensation;Body mechanics;Flexibility;IADL;Pain;Dexterity;FMC;Proprioception;Strength;Edema;Mobility;ROM;Tone    Rehab Potential Good    Clinical Decision Making Several treatment options, min-mod task modification necessary  Comorbidities Affecting Occupational Performance: Presence of comorbidities impacting occupational performance    Modification or Assistance to Complete Evaluation  Min-Moderate modification of tasks or assist with assess necessary to complete eval    OT Frequency 1x / week    OT Duration 12 weeks    OT Treatment/Interventions Self-care/ADL training;Psychosocial skills training;Neuromuscular education;Patient/family education;Energy conservation;Therapeutic exercise;DME and/or AE instruction;Therapeutic activities    Consulted and Agree with Plan of Care Family member/caregiver;Patient             Olegario Messier, MS, OTR/L  06/17/23, 9:13 AM

## 2023-06-19 ENCOUNTER — Ambulatory Visit: Payer: Medicare HMO | Admitting: Occupational Therapy

## 2023-06-24 ENCOUNTER — Ambulatory Visit: Payer: Medicare HMO | Admitting: Occupational Therapy

## 2023-06-26 ENCOUNTER — Ambulatory Visit: Payer: Medicare HMO | Admitting: Occupational Therapy

## 2023-07-01 ENCOUNTER — Ambulatory Visit: Payer: Medicare HMO | Admitting: Occupational Therapy

## 2023-07-03 ENCOUNTER — Ambulatory Visit: Payer: Medicare HMO | Admitting: Occupational Therapy

## 2023-07-08 ENCOUNTER — Ambulatory Visit: Payer: Medicare HMO | Admitting: Occupational Therapy

## 2023-07-10 ENCOUNTER — Ambulatory Visit: Payer: Medicare HMO | Admitting: Occupational Therapy

## 2023-07-15 ENCOUNTER — Ambulatory Visit: Payer: Medicare HMO | Admitting: Occupational Therapy

## 2023-07-17 ENCOUNTER — Ambulatory Visit: Payer: Medicare HMO | Admitting: Occupational Therapy

## 2023-07-22 ENCOUNTER — Ambulatory Visit: Payer: Medicare HMO | Admitting: Occupational Therapy

## 2023-07-24 ENCOUNTER — Ambulatory Visit: Payer: Medicare HMO | Admitting: Occupational Therapy

## 2023-07-29 ENCOUNTER — Ambulatory Visit: Payer: Medicare HMO | Admitting: Occupational Therapy

## 2023-07-31 ENCOUNTER — Ambulatory Visit: Payer: Medicare HMO | Admitting: Occupational Therapy

## 2023-08-05 ENCOUNTER — Ambulatory Visit: Payer: Medicare HMO | Admitting: Occupational Therapy

## 2023-08-07 ENCOUNTER — Ambulatory Visit: Payer: Medicare HMO | Admitting: Occupational Therapy

## 2023-08-12 ENCOUNTER — Ambulatory Visit: Payer: Medicare HMO | Admitting: Occupational Therapy

## 2023-08-14 ENCOUNTER — Ambulatory Visit: Payer: Medicare HMO | Admitting: Occupational Therapy

## 2023-08-19 ENCOUNTER — Ambulatory Visit: Payer: Medicare HMO | Admitting: Occupational Therapy

## 2023-08-21 ENCOUNTER — Ambulatory Visit: Payer: Medicare HMO | Admitting: Occupational Therapy

## 2023-08-22 ENCOUNTER — Other Ambulatory Visit: Payer: Self-pay | Admitting: Nurse Practitioner

## 2023-08-22 DIAGNOSIS — K219 Gastro-esophageal reflux disease without esophagitis: Secondary | ICD-10-CM

## 2023-08-22 NOTE — Telephone Encounter (Signed)
Patient needs an appointment

## 2023-08-26 ENCOUNTER — Ambulatory Visit: Payer: Medicare HMO | Admitting: Occupational Therapy

## 2023-08-28 ENCOUNTER — Ambulatory Visit: Payer: Medicare HMO | Admitting: Occupational Therapy

## 2023-09-02 ENCOUNTER — Ambulatory Visit: Payer: Medicare HMO | Admitting: Occupational Therapy

## 2023-09-09 ENCOUNTER — Ambulatory Visit: Payer: Medicare HMO | Admitting: Occupational Therapy

## 2023-09-11 ENCOUNTER — Ambulatory Visit: Payer: Medicare HMO | Admitting: Occupational Therapy

## 2023-09-16 ENCOUNTER — Other Ambulatory Visit: Payer: Self-pay | Admitting: Nurse Practitioner

## 2023-09-16 ENCOUNTER — Ambulatory Visit: Payer: Medicare HMO | Admitting: Occupational Therapy

## 2023-09-16 DIAGNOSIS — K219 Gastro-esophageal reflux disease without esophagitis: Secondary | ICD-10-CM

## 2023-09-18 ENCOUNTER — Ambulatory Visit: Payer: Medicare HMO | Admitting: Occupational Therapy

## 2023-09-23 ENCOUNTER — Ambulatory Visit: Payer: Medicare HMO | Admitting: Occupational Therapy

## 2023-09-25 ENCOUNTER — Ambulatory Visit: Payer: Medicare HMO | Admitting: Occupational Therapy

## 2024-01-15 ENCOUNTER — Other Ambulatory Visit: Payer: Self-pay

## 2024-01-15 ENCOUNTER — Emergency Department (HOSPITAL_COMMUNITY)

## 2024-01-15 ENCOUNTER — Other Ambulatory Visit (HOSPITAL_COMMUNITY)

## 2024-01-15 ENCOUNTER — Inpatient Hospital Stay (HOSPITAL_COMMUNITY)

## 2024-01-15 ENCOUNTER — Inpatient Hospital Stay (HOSPITAL_COMMUNITY)
Admission: EM | Admit: 2024-01-15 | Discharge: 2024-01-20 | DRG: 101 | Disposition: A | Discharge status: Home Health | Attending: Internal Medicine | Admitting: Internal Medicine

## 2024-01-15 ENCOUNTER — Encounter (HOSPITAL_COMMUNITY): Payer: Self-pay | Admitting: Internal Medicine

## 2024-01-15 DIAGNOSIS — I69351 Hemiplegia and hemiparesis following cerebral infarction affecting right dominant side: Secondary | ICD-10-CM

## 2024-01-15 DIAGNOSIS — I6782 Cerebral ischemia: Secondary | ICD-10-CM | POA: Diagnosis not present

## 2024-01-15 DIAGNOSIS — K219 Gastro-esophageal reflux disease without esophagitis: Secondary | ICD-10-CM | POA: Diagnosis present

## 2024-01-15 DIAGNOSIS — E872 Acidosis, unspecified: Secondary | ICD-10-CM | POA: Diagnosis not present

## 2024-01-15 DIAGNOSIS — G40911 Epilepsy, unspecified, intractable, with status epilepticus: Principal | ICD-10-CM | POA: Diagnosis present

## 2024-01-15 DIAGNOSIS — R531 Weakness: Secondary | ICD-10-CM

## 2024-01-15 DIAGNOSIS — Z8249 Family history of ischemic heart disease and other diseases of the circulatory system: Secondary | ICD-10-CM

## 2024-01-15 DIAGNOSIS — R4701 Aphasia: Secondary | ICD-10-CM | POA: Diagnosis not present

## 2024-01-15 DIAGNOSIS — G9340 Encephalopathy, unspecified: Secondary | ICD-10-CM

## 2024-01-15 DIAGNOSIS — R000 Tachycardia, unspecified: Secondary | ICD-10-CM | POA: Diagnosis not present

## 2024-01-15 DIAGNOSIS — R569 Unspecified convulsions: Secondary | ICD-10-CM | POA: Diagnosis not present

## 2024-01-15 DIAGNOSIS — E781 Pure hyperglyceridemia: Secondary | ICD-10-CM | POA: Diagnosis present

## 2024-01-15 DIAGNOSIS — I161 Hypertensive emergency: Secondary | ICD-10-CM | POA: Diagnosis present

## 2024-01-15 DIAGNOSIS — Z4682 Encounter for fitting and adjustment of non-vascular catheter: Secondary | ICD-10-CM | POA: Diagnosis not present

## 2024-01-15 DIAGNOSIS — Z66 Do not resuscitate: Secondary | ICD-10-CM | POA: Diagnosis not present

## 2024-01-15 DIAGNOSIS — G9389 Other specified disorders of brain: Secondary | ICD-10-CM | POA: Diagnosis not present

## 2024-01-15 DIAGNOSIS — R414 Neurologic neglect syndrome: Secondary | ICD-10-CM | POA: Diagnosis present

## 2024-01-15 DIAGNOSIS — I672 Cerebral atherosclerosis: Secondary | ICD-10-CM | POA: Diagnosis present

## 2024-01-15 DIAGNOSIS — Z95828 Presence of other vascular implants and grafts: Secondary | ICD-10-CM | POA: Diagnosis not present

## 2024-01-15 DIAGNOSIS — Z8673 Personal history of transient ischemic attack (TIA), and cerebral infarction without residual deficits: Secondary | ICD-10-CM

## 2024-01-15 DIAGNOSIS — Z7982 Long term (current) use of aspirin: Secondary | ICD-10-CM | POA: Diagnosis not present

## 2024-01-15 DIAGNOSIS — G40901 Epilepsy, unspecified, not intractable, with status epilepticus: Secondary | ICD-10-CM | POA: Diagnosis not present

## 2024-01-15 DIAGNOSIS — Z791 Long term (current) use of non-steroidal anti-inflammatories (NSAID): Secondary | ICD-10-CM | POA: Diagnosis not present

## 2024-01-15 DIAGNOSIS — H5500 Unspecified nystagmus: Secondary | ICD-10-CM | POA: Diagnosis not present

## 2024-01-15 DIAGNOSIS — I69354 Hemiplegia and hemiparesis following cerebral infarction affecting left non-dominant side: Secondary | ICD-10-CM | POA: Diagnosis not present

## 2024-01-15 DIAGNOSIS — I69320 Aphasia following cerebral infarction: Secondary | ICD-10-CM

## 2024-01-15 DIAGNOSIS — I674 Hypertensive encephalopathy: Secondary | ICD-10-CM | POA: Diagnosis not present

## 2024-01-15 DIAGNOSIS — R0689 Other abnormalities of breathing: Secondary | ICD-10-CM | POA: Diagnosis present

## 2024-01-15 DIAGNOSIS — Z79899 Other long term (current) drug therapy: Secondary | ICD-10-CM | POA: Diagnosis not present

## 2024-01-15 DIAGNOSIS — I69398 Other sequelae of cerebral infarction: Secondary | ICD-10-CM | POA: Diagnosis not present

## 2024-01-15 DIAGNOSIS — I63511 Cerebral infarction due to unspecified occlusion or stenosis of right middle cerebral artery: Secondary | ICD-10-CM | POA: Diagnosis not present

## 2024-01-15 DIAGNOSIS — E876 Hypokalemia: Secondary | ICD-10-CM | POA: Diagnosis present

## 2024-01-15 DIAGNOSIS — I10 Essential (primary) hypertension: Secondary | ICD-10-CM | POA: Diagnosis not present

## 2024-01-15 DIAGNOSIS — Z91148 Patient's other noncompliance with medication regimen for other reason: Secondary | ICD-10-CM | POA: Diagnosis not present

## 2024-01-15 DIAGNOSIS — Z823 Family history of stroke: Secondary | ICD-10-CM

## 2024-01-15 DIAGNOSIS — Z781 Physical restraint status: Secondary | ICD-10-CM

## 2024-01-15 DIAGNOSIS — I6522 Occlusion and stenosis of left carotid artery: Secondary | ICD-10-CM | POA: Diagnosis not present

## 2024-01-15 DIAGNOSIS — R4182 Altered mental status, unspecified: Secondary | ICD-10-CM | POA: Diagnosis not present

## 2024-01-15 DIAGNOSIS — R29818 Other symptoms and signs involving the nervous system: Secondary | ICD-10-CM | POA: Diagnosis not present

## 2024-01-15 DIAGNOSIS — J9600 Acute respiratory failure, unspecified whether with hypoxia or hypercapnia: Secondary | ICD-10-CM | POA: Diagnosis not present

## 2024-01-15 DIAGNOSIS — R456 Violent behavior: Secondary | ICD-10-CM | POA: Diagnosis not present

## 2024-01-15 DIAGNOSIS — R0902 Hypoxemia: Secondary | ICD-10-CM | POA: Diagnosis not present

## 2024-01-15 DIAGNOSIS — I6501 Occlusion and stenosis of right vertebral artery: Secondary | ICD-10-CM | POA: Diagnosis not present

## 2024-01-15 DIAGNOSIS — R451 Restlessness and agitation: Secondary | ICD-10-CM | POA: Diagnosis not present

## 2024-01-15 LAB — LACTIC ACID, PLASMA: Lactic Acid, Venous: 3.8 mmol/L (ref 0.5–1.9)

## 2024-01-15 LAB — I-STAT CHEM 8, ED
BUN: 7 mg/dL — ABNORMAL LOW (ref 8–23)
Calcium, Ion: 0.99 mmol/L — ABNORMAL LOW (ref 1.15–1.40)
Chloride: 101 mmol/L (ref 98–111)
Creatinine, Ser: 0.8 mg/dL (ref 0.61–1.24)
Glucose, Bld: 100 mg/dL — ABNORMAL HIGH (ref 70–99)
HCT: 46 % (ref 39.0–52.0)
Hemoglobin: 15.6 g/dL (ref 13.0–17.0)
Potassium: 4.2 mmol/L (ref 3.5–5.1)
Sodium: 135 mmol/L (ref 135–145)
TCO2: 23 mmol/L (ref 22–32)

## 2024-01-15 LAB — I-STAT ARTERIAL BLOOD GAS, ED
Acid-base deficit: 3 mmol/L — ABNORMAL HIGH (ref 0.0–2.0)
Bicarbonate: 22.9 mmol/L (ref 20.0–28.0)
Calcium, Ion: 1.16 mmol/L (ref 1.15–1.40)
HCT: 49 % (ref 39.0–52.0)
Hemoglobin: 16.7 g/dL (ref 13.0–17.0)
O2 Saturation: 100 %
Patient temperature: 98.5
Potassium: 3.8 mmol/L (ref 3.5–5.1)
Sodium: 133 mmol/L — ABNORMAL LOW (ref 135–145)
TCO2: 24 mmol/L (ref 22–32)
pCO2 arterial: 40.8 mmHg (ref 32–48)
pH, Arterial: 7.357 (ref 7.35–7.45)
pO2, Arterial: 428 mmHg — ABNORMAL HIGH (ref 83–108)

## 2024-01-15 LAB — COMPREHENSIVE METABOLIC PANEL WITH GFR
ALT: 19 U/L (ref 0–44)
AST: 21 U/L (ref 15–41)
Albumin: 4.2 g/dL (ref 3.5–5.0)
Alkaline Phosphatase: 70 U/L (ref 38–126)
Anion gap: 10 (ref 5–15)
BUN: 6 mg/dL — ABNORMAL LOW (ref 8–23)
CO2: 24 mmol/L (ref 22–32)
Calcium: 8.8 mg/dL — ABNORMAL LOW (ref 8.9–10.3)
Chloride: 101 mmol/L (ref 98–111)
Creatinine, Ser: 0.76 mg/dL (ref 0.61–1.24)
GFR, Estimated: >60 mL/min (ref >60)
Glucose, Bld: 107 mg/dL — ABNORMAL HIGH (ref 70–99)
Potassium: 4.1 mmol/L (ref 3.5–5.1)
Sodium: 135 mmol/L (ref 135–145)
Total Bilirubin: 1.1 mg/dL (ref 0.0–1.2)
Total Protein: 7.4 g/dL (ref 6.5–8.1)

## 2024-01-15 LAB — URINALYSIS, ROUTINE W REFLEX MICROSCOPIC
Bilirubin Urine: NEGATIVE
Glucose, UA: NEGATIVE mg/dL
Hgb urine dipstick: NEGATIVE
Ketones, ur: NEGATIVE mg/dL
Leukocytes,Ua: NEGATIVE
Nitrite: NEGATIVE
Protein, ur: NEGATIVE mg/dL
Specific Gravity, Urine: 1.013 (ref 1.005–1.030)
pH: 9 — ABNORMAL HIGH (ref 5.0–8.0)

## 2024-01-15 LAB — PROTIME-INR
INR: 1 (ref 0.8–1.2)
Prothrombin Time: 13.3 s (ref 11.4–15.2)

## 2024-01-15 LAB — ETHANOL: Alcohol, Ethyl (B): <10 mg/dL (ref <10)

## 2024-01-15 LAB — GLUCOSE, CAPILLARY
Glucose-Capillary: 81 mg/dL (ref 70–99)
Glucose-Capillary: 82 mg/dL (ref 70–99)

## 2024-01-15 LAB — APTT: aPTT: 31 s (ref 24–36)

## 2024-01-15 LAB — CBC
HCT: 45.9 % (ref 39.0–52.0)
Hemoglobin: 15.1 g/dL (ref 13.0–17.0)
MCH: 27.9 pg (ref 26.0–34.0)
MCHC: 32.9 g/dL (ref 30.0–36.0)
MCV: 84.7 fL (ref 80.0–100.0)
Platelets: 297 10*3/uL (ref 150–400)
RBC: 5.42 MIL/uL (ref 4.22–5.81)
RDW: 12.6 % (ref 11.5–15.5)
WBC: 9.6 10*3/uL (ref 4.0–10.5)
nRBC: 0 % (ref 0.0–0.2)

## 2024-01-15 LAB — RAPID URINE DRUG SCREEN, HOSP PERFORMED
Amphetamines: NOT DETECTED
Barbiturates: NOT DETECTED
Benzodiazepines: POSITIVE — AB
Cocaine: NOT DETECTED
Opiates: NOT DETECTED
Tetrahydrocannabinol: NOT DETECTED

## 2024-01-15 LAB — DIFFERENTIAL
Abs Immature Granulocytes: 0.03 10*3/uL (ref 0.00–0.07)
Basophils Absolute: 0.1 10*3/uL (ref 0.0–0.1)
Basophils Relative: 1 %
Eosinophils Absolute: 0.2 10*3/uL (ref 0.0–0.5)
Eosinophils Relative: 2 %
Immature Granulocytes: 0 %
Lymphocytes Relative: 25 %
Lymphs Abs: 2.4 10*3/uL (ref 0.7–4.0)
Monocytes Absolute: 1.2 10*3/uL — ABNORMAL HIGH (ref 0.1–1.0)
Monocytes Relative: 13 %
Neutro Abs: 5.7 10*3/uL (ref 1.7–7.7)
Neutrophils Relative %: 59 %

## 2024-01-15 LAB — CBG MONITORING, ED
Glucose-Capillary: 97 mg/dL (ref 70–99)
Glucose-Capillary: 105 mg/dL — ABNORMAL HIGH (ref 70–99)
Glucose-Capillary: 100 mg/dL — ABNORMAL HIGH (ref 70–99)

## 2024-01-15 LAB — HEMOGLOBIN A1C
Hgb A1c MFr Bld: 5.2 % (ref 4.8–5.6)
Mean Plasma Glucose: 102.54 mg/dL

## 2024-01-15 LAB — MAGNESIUM: Magnesium: 2.1 mg/dL (ref 1.7–2.4)

## 2024-01-15 LAB — MRSA NEXT GEN BY PCR, NASAL: MRSA by PCR Next Gen: NOT DETECTED

## 2024-01-15 LAB — HIV ANTIBODY (ROUTINE TESTING W REFLEX): HIV Screen 4th Generation wRfx: NONREACTIVE

## 2024-01-15 MED ORDER — ORAL CARE MOUTH RINSE: 15.0000 mL | OROMUCOSAL | Status: DC | PRN

## 2024-01-15 MED ORDER — LEVETIRACETAM IN NACL 1000 MG/100ML IV SOLN
1000.0000 mg | Freq: Once | INTRAVENOUS | Status: DC
  Filled 2024-01-15: qty 100

## 2024-01-15 MED ORDER — ROCURONIUM BROMIDE 10 MG/ML (PF) SYRINGE
PREFILLED_SYRINGE | INTRAVENOUS | Status: DC | PRN
  Administered 2024-01-15: 80 mg via INTRAVENOUS

## 2024-01-15 MED ORDER — IOHEXOL 350 MG/ML SOLN
100.0000 mL | Freq: Once | INTRAVENOUS | Status: AC | PRN
  Administered 2024-01-15: 100 mL via INTRAVENOUS

## 2024-01-15 MED ORDER — PANTOPRAZOLE SODIUM 40 MG IV SOLR
40.0000 mg | Freq: Every day | INTRAVENOUS | Status: DC
  Administered 2024-01-15 – 2024-01-17 (×3): 40 mg via INTRAVENOUS
  Filled 2024-01-15 (×3): qty 10

## 2024-01-15 MED ORDER — FENTANYL 2500MCG IN NS 250ML (10MCG/ML) PREMIX INFUSION
50.0000 ug/h | INTRAVENOUS | Status: DC
  Administered 2024-01-15: 100 ug/h via INTRAVENOUS
  Administered 2024-01-15: 50 ug/h via INTRAVENOUS
  Filled 2024-01-15: qty 250

## 2024-01-15 MED ORDER — SODIUM CHLORIDE 0.9 % IV BOLUS
500.0000 mL | Freq: Once | INTRAVENOUS | Status: AC
  Administered 2024-01-15: 500 mL via INTRAVENOUS

## 2024-01-15 MED ORDER — LEVETIRACETAM IN NACL 1000 MG/100ML IV SOLN
1000.0000 mg | Freq: Once | INTRAVENOUS | Status: AC
  Administered 2024-01-15: 1000 mg via INTRAVENOUS

## 2024-01-15 MED ORDER — ETOMIDATE 2 MG/ML IV SOLN
INTRAVENOUS | Status: DC | PRN
  Administered 2024-01-15: 20 mg via INTRAVENOUS

## 2024-01-15 MED ORDER — PROPOFOL 1000 MG/100ML IV EMUL
INTRAVENOUS | Status: AC
Start: 2024-01-15 — End: 2024-01-15
  Administered 2024-01-15: 40 mg
  Administered 2024-01-15: 5 ug
  Filled 2024-01-15: qty 100

## 2024-01-15 MED ORDER — MIDAZOLAM HCL 2 MG/2ML IJ SOLN: 1.0000 mg | INTRAMUSCULAR | Status: DC | PRN

## 2024-01-15 MED ORDER — CLEVIDIPINE BUTYRATE 0.5 MG/ML IV EMUL
0.0000 mg/h | INTRAVENOUS | Status: DC
  Administered 2024-01-15: 2 mg/h via INTRAVENOUS
  Filled 2024-01-15: qty 100

## 2024-01-15 MED ORDER — POLYETHYLENE GLYCOL 3350 17 G PO PACK: 17.0000 g | PACK | Freq: Every day | ORAL | Status: DC | PRN

## 2024-01-15 MED ORDER — CHLORHEXIDINE GLUCONATE CLOTH 2 % EX PADS
6.0000 | MEDICATED_PAD | Freq: Every day | CUTANEOUS | Status: DC
  Administered 2024-01-15 – 2024-01-19 (×6): 6 via TOPICAL

## 2024-01-15 MED ORDER — ONDANSETRON HCL 4 MG/2ML IJ SOLN: 4.0000 mg | Freq: Four times a day (QID) | INTRAMUSCULAR | Status: DC | PRN

## 2024-01-15 MED ORDER — ALBUMIN HUMAN 25 % IV SOLN
25.0000 g | Freq: Four times a day (QID) | INTRAVENOUS | Status: AC
  Administered 2024-01-15 – 2024-01-16 (×4): 25 g via INTRAVENOUS
  Filled 2024-01-15 (×4): qty 100

## 2024-01-15 MED ORDER — LORAZEPAM 2 MG/ML IJ SOLN
2.0000 mg | Freq: Once | INTRAMUSCULAR | Status: AC
  Administered 2024-01-15: 2 mg via INTRAVENOUS
  Filled 2024-01-15: qty 1

## 2024-01-15 MED ORDER — LORAZEPAM 2 MG/ML IJ SOLN
INTRAMUSCULAR | Status: AC
  Filled 2024-01-15: qty 1

## 2024-01-15 MED ORDER — FENTANYL CITRATE PF 50 MCG/ML IJ SOSY
50.0000 ug | PREFILLED_SYRINGE | Freq: Once | INTRAMUSCULAR | Status: AC
  Administered 2024-01-15: 50 ug via INTRAVENOUS
  Filled 2024-01-15: qty 1

## 2024-01-15 MED ORDER — GADOBUTROL 1 MMOL/ML IV SOLN
8.0000 mL | Freq: Once | INTRAVENOUS | Status: AC | PRN
  Administered 2024-01-15: 8 mL via INTRAVENOUS

## 2024-01-15 MED ORDER — INSULIN ASPART 100 UNIT/ML IJ SOLN
0.0000 [IU] | INTRAMUSCULAR | Status: DC
Start: 2024-01-15 — End: 2024-01-17
  Administered 2024-01-16 – 2024-01-17 (×2): 2 [IU] via SUBCUTANEOUS

## 2024-01-15 MED ORDER — FENTANYL CITRATE PF 50 MCG/ML IJ SOSY: 25.0000 ug | PREFILLED_SYRINGE | INTRAMUSCULAR | Status: DC | PRN

## 2024-01-15 MED ORDER — LABETALOL HCL 5 MG/ML IV SOLN
10.0000 mg | Freq: Once | INTRAVENOUS | Status: AC
  Administered 2024-01-15: 10 mg via INTRAVENOUS

## 2024-01-15 MED ORDER — DOCUSATE SODIUM 50 MG/5ML PO LIQD: 100.0000 mg | Freq: Two times a day (BID) | ORAL | Status: DC | PRN

## 2024-01-15 MED ORDER — LACTATED RINGERS IV BOLUS
500.0000 mL | Freq: Once | INTRAVENOUS | Status: AC
  Administered 2024-01-15: 500 mL via INTRAVENOUS

## 2024-01-15 MED ORDER — LORAZEPAM 2 MG/ML IJ SOLN
2.0000 mg | Freq: Once | INTRAMUSCULAR | Status: AC
  Administered 2024-01-15: 2 mg via INTRAVENOUS

## 2024-01-15 MED ORDER — HEPARIN SODIUM (PORCINE) 5000 UNIT/ML IJ SOLN
5000.0000 [IU] | Freq: Three times a day (TID) | INTRAMUSCULAR | Status: DC
  Administered 2024-01-15 – 2024-01-20 (×14): 5000 [IU] via SUBCUTANEOUS
  Filled 2024-01-15 (×13): qty 1

## 2024-01-15 MED ORDER — FENTANYL CITRATE PF 50 MCG/ML IJ SOSY
25.0000 ug | PREFILLED_SYRINGE | INTRAMUSCULAR | Status: DC | PRN
Start: 2024-01-15 — End: 2024-01-16

## 2024-01-15 MED ORDER — FENTANYL BOLUS VIA INFUSION
50.0000 ug | INTRAVENOUS | Status: DC | PRN
Start: 2024-01-15 — End: 2024-01-16

## 2024-01-15 MED ORDER — ORAL CARE MOUTH RINSE
15.0000 mL | OROMUCOSAL | Status: DC
  Administered 2024-01-15 – 2024-01-16 (×10): 15 mL via OROMUCOSAL

## 2024-01-15 MED ORDER — ACETAMINOPHEN 325 MG PO TABS: 650.0000 mg | ORAL_TABLET | ORAL | Status: DC | PRN

## 2024-01-15 MED ORDER — ORAL CARE MOUTH RINSE
15.0000 mL | OROMUCOSAL | Status: DC
  Administered 2024-01-15: 15 mL via OROMUCOSAL

## 2024-01-15 MED ORDER — LACTATED RINGERS IV SOLN
INTRAVENOUS | Status: DC
  Administered 2024-01-17: 50 mL/h via INTRAVENOUS

## 2024-01-15 MED ORDER — PROPOFOL 1000 MG/100ML IV EMUL
0.0000 ug/kg/min | INTRAVENOUS | Status: DC
  Administered 2024-01-15 – 2024-01-16 (×3): 20 ug/kg/min via INTRAVENOUS
  Filled 2024-01-15 (×2): qty 100

## 2024-01-15 MED ORDER — LEVETIRACETAM 500 MG/5ML IV SOLN
2000.0000 mg | Freq: Once | INTRAVENOUS | Status: AC
  Administered 2024-01-15: 2000 mg via INTRAVENOUS
  Filled 2024-01-15: qty 20

## 2024-01-15 MED ORDER — LEVETIRACETAM IN NACL 500 MG/100ML IV SOLN
500.0000 mg | Freq: Two times a day (BID) | INTRAVENOUS | Status: DC
  Administered 2024-01-15 – 2024-01-17 (×6): 500 mg via INTRAVENOUS
  Filled 2024-01-15 (×6): qty 100

## 2024-01-15 NOTE — Progress Notes (Signed)
 Rec'd pt from ED RT, already in MRI on vent.  Pt transported to ICU via vent s/p MRI w/ no apparent complications.  Sat 99% in ICU

## 2024-01-15 NOTE — ED Notes (Addendum)
 RN made MD Amada Jupiter and Katrinka Blazing aware of patient's blood pressure. No new orders, per MD okay with blood pressure and MAP above 70 at this time.

## 2024-01-15 NOTE — ED Provider Notes (Signed)
 [place] EMERGENCY DEPARTMENT AT [facility] Provider Note   CSN: [id number] Arrival date & time: 01/15/24  1202     History  Chief Complaint  Patient presents with   Code Stroke    Cedric Mcclaine Lavine is a 68 y.o. male.  HPI 68 year old male presents with concern for stroke.  He was activated as a code stroke by EMS.  History is all from EMS.  Wife saw him and he was normal at 8 AM and then had trouble speaking.  However his symptoms are resolved completely and she last saw him around 10 AM but when she came back around 11 he was not speaking and confused.  EMS has found him with right-sided neglect though he has also been agitated.  He seems to have equal movement in all 4 extremities.  They gave IM Versed with some partial calming down.  His initial blood pressure was over 200 systolic but this has come down as well with the Versed.  He is supposed to be on a blood thinner but has not taken anything in the last couple years.  Home Medications Prior to Admission medications   Medication Sig Start Date End Date Taking? Authorizing Provider  acetaminophen (TYLENOL) 325 MG tablet Take 2 tablets (650 mg total) by mouth every 4 (four) hours as needed for mild pain (or temp > 37.5 C (99.5 F)). 02/28/21   Angiulli, Mcarthur Rossetti, PA-C  amLODipine (NORVASC) 5 MG tablet TAKE 1 TABLET (5 MG TOTAL) BY MOUTH DAILY. 02/27/22   Sallyanne Kuster, NP  aspirin EC 81 MG tablet Take 81 mg by mouth daily. Swallow whole.    [provider]  lovastatin (MEVACOR) 10 MG tablet TAKE 1 TABLET BY MOUTH EVERYDAY AT BEDTIME 01/18/22   Sallyanne Kuster, NP  meloxicam (MOBIC) 15 MG tablet Take 1 tablet (15 mg total) by mouth daily. 03/06/22   Sallyanne Kuster, NP  pantoprazole (PROTONIX) 40 MG tablet TAKE 1 TABLET BY MOUTH EVERY DAY 08/22/23   Sallyanne Kuster, NP  triamcinolone cream (KENALOG) 0.1 % Apply 1 application topically 2 (two) times daily. 09/12/21   Sallyanne Kuster, NP      Allergies     Patient has no known allergies.    Review of Systems   Review of Systems  Unable to perform ROS: Mental status change    Physical Exam Updated Vital Signs BP (!) 148/78   Pulse 97   Temp 99.3 F (37.4 C)   Resp 18   Ht 5\' 10"  (1.778 m)   Wt 79.6 kg   SpO2 100%   BMI 25.18 kg/m  Physical Exam Vitals and nursing note reviewed.  Constitutional:      Appearance: He is well-developed.  HENT:     Head: Normocephalic and atraumatic.  Eyes:     Comments: Eyes are primarily deviated to the left and he will not cross the midline to the right. Pupils are dilated  Cardiovascular:     Rate and Rhythm: Regular rhythm. Tachycardia present.     Heart sounds: Normal heart sounds.  Pulmonary:     Effort: Pulmonary effort is normal.     Breath sounds: Normal breath sounds.  Abdominal:     General: There is no distension.  Skin:    General: Skin is warm and dry.  Neurological:     Mental Status: He is alert.     Comments: Patient is awake and alert but seemingly agitated.  He seems to be frequently reaching from  his right side to his left side.  He moves all 4 extremities though does not follow commands.  He responds to pain in all 4 extremities.  Psychiatric:        Behavior: Behavior is agitated.     ED Results / Procedures / Treatments   Labs (all labs ordered are listed, but only abnormal results are displayed) Labs Reviewed  DIFFERENTIAL - Abnormal; Notable for the following components:      Result Value   Monocytes Absolute 1.2 (*)    All other components within normal limits  COMPREHENSIVE METABOLIC PANEL WITH GFR - Abnormal; Notable for the following components:   Glucose, Bld 107 (*)    BUN 6 (*)    Calcium 8.8 (*)    All other components within normal limits  I-STAT CHEM 8, ED - Abnormal; Notable for the following components:   BUN 7 (*)    Glucose, Bld 100 (*)    Calcium, Ion 0.99 (*)    All other components within normal limits  I-STAT ARTERIAL BLOOD GAS, ED -  Abnormal; Notable for the following components:   pO2, Arterial 428 (*)    Acid-base deficit 3.0 (*)    Sodium 133 (*)    All other components within normal limits  ETHANOL  PROTIME-INR  APTT  CBC  RAPID URINE DRUG SCREEN, HOSP PERFORMED  URINALYSIS, ROUTINE W REFLEX MICROSCOPIC  HIV ANTIBODY (ROUTINE TESTING W REFLEX)  HEMOGLOBIN A1C  BLOOD GAS, ARTERIAL  LACTIC ACID, PLASMA  MAGNESIUM  CBG MONITORING, ED    EKG EKG Interpretation Date/Time:  Thursday January 15 2024 12:36:05 EDT Ventricular Rate:  124 PR Interval:  140 QRS Duration:  142 QT Interval:  326 QTC Calculation: 469 R Axis:   -42  Text Interpretation: Sinus tachycardia Probable left atrial enlargement RBBB and LAFB Left ventricular hypertrophy HR is faster, otherwise similar to 2022 Confirmed by Pricilla Loveless [phone]) on 01/15/2024 12:49:49 PM  Radiology DG Abd 1 View Result Date: 01/15/2024 CLINICAL DATA:  Orogastric tube placement. EXAM: ABDOMEN - 1 VIEW COMPARISON:  January 25, 2021. FINDINGS: Distal tip of nasogastric tube is seen in expected position of proximal stomach. IMPRESSION: Nasogastric tube tip seen in expected position of proximal stomach. Electronically Signed   By: Lupita Raider M.D.   On: 01/15/2024 14:49   DG Chest Portable 1 View Result Date: 01/15/2024 CLINICAL DATA:  Intubation. EXAM: PORTABLE CHEST 1 VIEW COMPARISON:  February 03, 2021. FINDINGS: The heart size and mediastinal contours are within normal limits. Endotracheal tube is in grossly good position. Both lungs are clear. The visualized skeletal structures are unremarkable. IMPRESSION: Endotracheal tube in grossly good position. No acute cardiopulmonary abnormality seen. Electronically Signed   By: Lupita Raider M.D.   On: 01/15/2024 14:48   CT ANGIO HEAD NECK W WO CM W PERF (CODE STROKE) Addendum Date: 01/15/2024 ADDENDUM REPORT: 01/15/2024 14:05 ADDENDUM: The above was relayed via text pager to Dr. Amada Jupiter On 01/15/2024 at 14:05 .  Electronically Signed   By: Marin Roberts M.D.   On: 01/15/2024 14:05   Result Date: 01/15/2024 CLINICAL DATA:  Sick, acute, stroke suspected. Left-sided gaze. Unable to speak. The EXAM: CT ANGIOGRAPHY HEAD AND NECK CT PERFUSION BRAIN TECHNIQUE: Multidetector CT imaging of the head and neck was performed using the standard protocol during bolus administration of intravenous contrast. Multiplanar CT image reconstructions and MIPs were obtained to evaluate the vascular anatomy. Carotid stenosis measurements (when applicable) are obtained utilizing NASCET criteria, using  the distal internal carotid diameter as the denominator. Multiphase CT imaging of the brain was performed following IV bolus contrast injection. Subsequent parametric perfusion maps were calculated using RAPID software. RADIATION DOSE REDUCTION: This exam was performed according to the departmental dose-optimization program which includes automated exposure control, adjustment of the mA and/or kV according to patient size and/or use of iterative reconstruction technique. CONTRAST:  OMNIPAQUE IOHEXOL 350 MG/ML SOLN COMPARISON:  CT head without contrast 01/15/2024 at 12:42 p.m. CT head without contrast 02/07/2021 and MR head without contrast 01/25/2021. FINDINGS: Brain: A large remote right MCA territory infarct is again seen. Ex vacuo dilation of the right lateral ventricle is present. No definite acute infarct is present. Mild white matter changes are present in the left hemisphere. Left basal ganglia are intact. Left insular ribbon is normal. No acute left-sided infarct is present. No acute hemorrhage or mass lesion is present. The brainstem and cerebellum are within normal limits. Midline structures are within normal limits. Vascular: No hyperdense vessel or unexpected calcification. Skull: Calvarium is intact. No focal lytic or blastic lesions are present. No significant extracranial soft tissue lesion is present. Sinuses/Orbits: The  paranasal sinuses and mastoid air cells are clear. The globes and orbits are within normal limits. Review of the MIP images confirms the above findings CTA NECK FINDINGS Aortic arch: A 3 vessel arch configuration is present. Atherosclerotic changes are present in the distal arch. Great vessel origins are within normal limits. No focal stenosis or aneurysm is present. Right carotid system: The right common carotid artery is within normal limits. A stent across the right carotid bifurcation is patent. The cervical right ICA is within normal limits distal to the stent. Left carotid system: The left common carotid artery is within normal limits. Atherosclerotic changes are present at the left bifurcation and proximal left ICA without significant stenosis. The distal cervical left ICA is normal. Vertebral arteries: The left vertebral artery is the dominant vessel. A high-grade stenosis is present at the origin of the right vertebral artery, new since the prior exam. Atherosclerotic changes present at the origin of the left vertebral artery without significant stenosis. No other stenosis is present in either vertebral artery in the neck. Skeleton: Multilevel degenerative changes are present in the cervical spine. Alignment is anatomic. No focal osseous lesions are present. Other neck: The patient is intubated. The soft tissues of the neck are otherwise unremarkable. Salivary glands are within normal limits. Thyroid is normal. No significant adenopathy is present. No focal mucosal or submucosal lesions are present. Upper chest: The lung apices are clear. The thoracic inlet is within normal limits. Review of the MIP images confirms the above findings CTA HEAD FINDINGS Anterior circulation: Internal carotid arteries are within normal limits from the skull base the ICA termini. The A1 and M1 segments are normal. The right A1 segment is dominant. MCA bifurcations are intact. High-grade stenosis is again noted at the origin of  the superior M2 division. High-grade stenosis is present in the proximal M3 division the inferior M2. Segmental narrowing of distal MCA branch vessels is similar the prior study. Emesis scratched at the left MCA and bilateral ACA branch vessels are within normal limits. Posterior circulation: Left vertebral artery is dominant. The PICA origins are visualized and normal. The vertebrobasilar junction is normal. Moderate Peyton Najjar is present within the basilar artery. No significant stenosis is present. The superior cerebellar arteries are within normal limits bilaterally. Both posterior cerebral arteries originate from the basilar tip. The PCA branch  vessels are within normal limits bilaterally. No aneurysm is present. Venous sinuses: The dural sinuses are patent. The straight sinus and deep cerebral veins are intact. Cortical veins are within normal limits. No significant vascular malformation is evident. Anatomic variants: None CT Brain Perfusion Findings: ASPECTS: 10/10 CBF (<30%) Volume: 5mL Perfusion (Tmax>6.0s) volume: 0mL Mismatch Volume: 0mL Infarction Location:Right MCA IMPRESSION: 1. 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 2. No acute intracranial abnormality or significant interval change. 3. 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 4. High-grade stenosis in a proximal M3 division of the inferior M2 of the right MCA. 5. Segmental narrowing of distal right MCA branch vessels is similar the prior study. 6. 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 7. Patent stent across the right carotid bifurcation. 8. Atherosclerotic changes at the left carotid bifurcation and proximal left ICA without significant stenosis. 9. No other significant proximal stenosis, aneurysm, or branch vessel occlusion within the Circle of Willis. 10. No significant stenosis in the posterior  circulation. 11. CT perfusion reflects the remote infarct. No significant area of ischemia is identified. Electronically Signed: By: Marin Roberts M.D. On: 01/15/2024 14:02   CT HEAD CODE STROKE WO CONTRAST Result Date: 01/15/2024 CLINICAL DATA:  Code stroke. Neuro deficit, concern for stroke, unable to speak. EXAM: CT HEAD WITHOUT CONTRAST TECHNIQUE: Contiguous axial images were obtained from the base of the skull through the vertex without intravenous contrast. RADIATION DOSE REDUCTION: This exam was performed according to the departmental dose-optimization program which includes automated exposure control, adjustment of the mA and/or kV according to patient size and/or use of iterative reconstruction technique. COMPARISON:  MRI head 01/25/2021. FINDINGS: Brain: No acute intracranial hemorrhage. Encephalomalacia in the right frontal lobe and right insula compatible with infarct noted on prior MRI. There are additional areas of hypoattenuation throughout the periventricular and subcortical white matter suggestive of chronic microvascular ischemic changes. There is significant motion artifact and streak artifact along the skull base. Within these limitations there is possible hypoattenuation in the right temporal lobe and right frontoparietal lobes posterior to the region of prior infarct with involvement of the M2, M3, and M6 regions. Mild generalized parenchymal volume loss. No midline shift. The basilar cisterns are patent. Ventricles: Prominence of the ventricles suggesting underlying parenchymal volume loss. Vascular: No hyperdense vessel or unexpected calcification. Skull: No acute or aggressive finding. Orbits: Orbits are symmetric. Sinuses: Mucosal thickening in the right maxillary sinus. Other: Mastoid air cells are clear. ASPECTS [facility] Stroke Program Early CT Score) - Ganglionic level infarction (caudate, lentiform nuclei, internal capsule, insula, M1-M3 cortex): 5 - Supraganglionic infarction  (M4-M6 cortex): 2 Total score (0-10 with 10 being normal): 7 IMPRESSION: 1. Examination is significantly limited by motion artifact and streak artifact along the skull base. Within these limitations there is redemonstration of remote right MCA territory infarct. Areas of hypoattenuation more posteriorly within the right temporal lobe and right frontoparietal lobes could reflect new areas of infarct. Recommend correlation with MRI. 2. No acute intracranial hemorrhage. 3. ASPECTS is 7 These results were communicated to Dr. Pearlean Brownie At 12:42 pm on 01/15/2024 by text page via the [facility] messaging system. Electronically Signed   By: Emily Filbert M.D.   On: 01/15/2024 12:42    Procedures Procedure Name: Intubation Date/Time: 01/15/2024 12:41 PM  Performed by: Pricilla Loveless, MDPre-anesthesia Checklist: Patient identified, Patient being monitored, Emergency Drugs  available, Timeout performed and Suction available Oxygen Delivery Method: Non-rebreather mask Preoxygenation: Pre-oxygenation with 100% oxygen Induction Type: Rapid sequence Ventilation: Mask ventilation without difficulty Laryngoscope Size: Glidescope and 4 Grade View: Grade I Tube size: 7.5 mm Number of attempts: 2 Airway Equipment and Method: Video-laryngoscopy Placement Confirmation: ETT inserted through vocal cords under direct vision, CO2 detector and Breath sounds checked- equal and bilateral Secured at: 25 cm Tube secured with: ETT holder Dental Injury: Bloody posterior oropharynx  Difficulty Due To: Difficulty was unanticipated Comments: Patient's cords are anterior, but despite being able to deliver the tube to the glottis, it was difficult to pass. Eventually able to maneuver tube through cords    .Critical Care  Performed by: Pricilla Loveless, MD Authorized by: Pricilla Loveless, MD   Critical care provider statement:    Critical care time (minutes):  40   Critical care time was exclusive of:  Separately billable procedures  and treating other patients   Critical care was necessary to treat or prevent imminent or life-threatening deterioration of the following conditions:  Respiratory failure and CNS failure or compromise   Critical care was time spent personally by me on the following activities:  Development of treatment plan with patient or surrogate, discussions with consultants, evaluation of patient's response to treatment, examination of patient, ordering and review of laboratory studies, ordering and review of radiographic studies, ordering and performing treatments and interventions, pulse oximetry, re-evaluation of patient's condition and review of old charts     Medications Ordered in ED Medications  levETIRAcetam (KEPPRA) IVPB 500 mg/100 mL premix (0 mg Intravenous Stopped 01/15/24 1341)  heparin injection 5,000 Units (5,000 Units Subcutaneous Given 01/15/24 1423)  pantoprazole (PROTONIX) injection 40 mg (has no administration in time range)  lactated ringers infusion (has no administration in time range)  acetaminophen (TYLENOL) tablet 650 mg (has no administration in time range)  docusate (COLACE) 50 MG/5ML liquid 100 mg (has no administration in time range)  polyethylene glycol (MIRALAX / GLYCOLAX) packet 17 g (has no administration in time range)  ondansetron (ZOFRAN) injection 4 mg (has no administration in time range)  insulin aspart (novoLOG) injection 0-15 Units (has no administration in time range)  Oral care mouth rinse (has no administration in time range)  Oral care mouth rinse (has no administration in time range)  propofol (DIPRIVAN) 1000 MG/100ML infusion (has no administration in time range)  midazolam (VERSED) injection 1-2 mg (has no administration in time range)  fentaNYL (SUBLIMAZE) injection 25 mcg (has no administration in time range)  fentaNYL (SUBLIMAZE) injection 25-100 mcg (has no administration in time range)  fentaNYL in NS (94mcg/ml) infusion-PREMIX (100 mcg/hr  Intravenous Infusion Verify 01/15/24 1443)  fentaNYL (SUBLIMAZE) bolus via infusion 50-100 mcg (has no administration in time range)  LORazepam (ATIVAN) injection 2 mg (2 mg Intravenous Given 01/15/24 1210)  LORazepam (ATIVAN) injection 2 mg (2 mg Intravenous Given 01/15/24 1218)  propofol (DIPRIVAN) 1000 MG/100ML infusion (40 mg  Bolus 01/15/24 1340)  levETIRAcetam (KEPPRA) 2,000 mg in sodium chloride 0.9 % 250 mL IVPB (0 mg Intravenous Stopped 01/15/24 1446)  labetalol (NORMODYNE) injection 10 mg (10 mg Intravenous Given 01/15/24 1239)  iohexol (OMNIPAQUE) 350 MG/ML injection 100 mL (100 mLs Intravenous Contrast Given 01/15/24 1335)  levETIRAcetam (KEPPRA) IVPB 1000 mg/100 mL premix (0 mg Intravenous Stopped 01/15/24 1433)  fentaNYL (SUBLIMAZE) injection 50 mcg (50 mcg Intravenous Given 01/15/24 1423)  lactated ringers bolus 500 mL (500 mLs Intravenous New Bag/Given 01/15/24 1458)    ED  Course/ Medical Decision Making/ A&P                                 Medical Decision Making Amount and/or Complexity of Data Reviewed Labs: ordered.    Details: Normal WBC. Normal CBG Radiology: ordered and independent interpretation performed.    Details: No head bleed ETT in good position ECG/medicine tests: ordered and independent interpretation performed.    Details: Sinus tachycardia  Risk Prescription drug management. Decision regarding hospitalization.   Patient presents as code stroke but has required multiple doses of Ativan as he is agitated and unable to lay still for CT. Given high concern for either seizures or LVO stroke, he was intubated for airway protection and to facilitate imaging/workup. CTA does not show LVO. He was treated with keppra and cleviprex. Neuro is recommending medical ICU admission. ICU team will admit. I have updated family at bedside.         Final Clinical Impression(s) / ED Diagnoses Final diagnoses:  Status epilepticus (HCC)  Acute respiratory failure,  unspecified whether with hypoxia or hypercapnia [facility])    Rx / DC Orders ED Discharge Orders     None         Pricilla Loveless, MD 01/15/24 1503

## 2024-01-15 NOTE — Progress Notes (Signed)
 RT assisted with intubation of patient and subsequent transport to CT and back to room without complication.

## 2024-01-15 NOTE — ED Notes (Signed)
 XR at bedside

## 2024-01-15 NOTE — Progress Notes (Signed)
 eLink Physician-Brief Progress Note Patient Name: Leonard [name] DOB: [birth year] MRN: [id number]   Date of Service  01/15/2024  HPI/Events of Note  Patient with soft blood pressures. He originally came in with a hypertensive urgency and received treatment in the ER.  eICU Interventions  NS 500 ml fluid bolus ordered.        Thomasene Lot Keiara Sneeringer 01/15/2024, 8:06 PM

## 2024-01-15 NOTE — ED Notes (Addendum)
 MD and NP made aware of systolic 110 blood pressure. Per MD okay, no new orders.

## 2024-01-15 NOTE — ED Notes (Signed)
 EEG at bedside.
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
## 2024-01-15 NOTE — ED Notes (Signed)
 MD Katrinka Blazing at bedside.

## 2024-01-15 NOTE — ED Notes (Signed)
 This RN called MRI regarding patient coming over to be scanned. Per Monique in MRI, no scanners available at this time. MD Katrinka Blazing and MD Amada Jupiter notified.

## 2024-01-15 NOTE — ED Notes (Signed)
 Awaiting keppra from main pharmacy

## 2024-01-15 NOTE — ED Notes (Signed)
 MD Rema Jasmine made aware of patient's current status. No new orders at this time.

## 2024-01-15 NOTE — Code Documentation (Signed)
 Stroke Response Nurse Documentation Code Documentation  Leonard [name] is a 68 y.o. male arriving to [facility]  via [facility] on 01/15/2024 with past medical hx of prior stroke with left deficits. On No antithrombotic. Code stroke was activated by EMS.   Patient from home where he was LKW at 0800 and now complaining of left sided gaze, unable to speak, and combative. Per EMS, wife last saw him normal at 0800 and then he had trouble speaking. When she left at 1000 he was not fully back to baseline and when she returned at 1100 he was unable to speak and confused.  Stroke team at the bedside on patient arrival. Labs drawn and patient cleared for CT by Dr. Criss Alvine. Patient to CT with team. NIHSS 22, see documentation for details and code stroke times. Patient with disoriented, not following commands, left gaze preference , right hemianopia, right facial droop, bilateral arm weakness, bilateral leg weakness, Global aphasia , dysarthria , and right neglect on exam. The following imaging was completed:  CT Head, CTA, and CTP. Patient is not a candidate for IV Thrombolytic due to outside of window per MD. Patient is not a candidate for IR due to no LVO noted on imaging.   Care Plan: VS/NIHSS q2hrs x 12hours, then q4hours; BP Goal <220/120.   Bedside handoff with ED RN Dahlia Client.    Felecia Jan  Stroke Response RN

## 2024-01-15 NOTE — ED Notes (Signed)
 Fentanyl bolus 25 mcg per Alinda Money [facility]

## 2024-01-15 NOTE — ED Notes (Addendum)
 Cleviprex paused per Lovie Chol, MD notified

## 2024-01-15 NOTE — ED Triage Notes (Signed)
 Patient arrives via [facility] as a code stroke activated for left side gaze and aphasia. Patient had LVO of 3. LKW 0800. Patient arrives combative, globally aphasic, and left side gaze. Patient is unable to follow commands. Patient was at home with wife at 0800 when she noticed changes in speech with slight resolution. Patient's wife came home again at 1100 and she endorses he was unable to speak. No 12 lead with EMS due to agitation, no IV in place. Patient able to squeeze hands with paramedic but unable to complete any other tests. Hx of stroke with left side deficit, on thinners but noncompliant. Per family, patient has weakness but able to complete tasks independently at home.   CBG 93 Initial BP 230/100 Last EMS BP 130/105

## 2024-01-15 NOTE — Progress Notes (Signed)
 eLink Physician-Brief Progress Note Patient Name: Leonard [name] DOB: 06/19/1956 MRN: [id number]   Date of Service  01/15/2024  HPI/Events of Note  Patient with hypertensive urgency and lactic acid of 3.8 on admission.  eICU Interventions  Order to trend lactic acid entered.        Thomasene Lot Larsen Dungan 01/15/2024, 11:15 PM
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
## 2024-01-15 NOTE — ED Notes (Signed)
Patient accompanied to MRI by this RN.

## 2024-01-15 NOTE — Progress Notes (Cosign Needed Addendum)
 LTM EEG hooked up and running - no initial skin breakdown - push button tested - [facility] NOT monitoring. Pt is in ED MRI leads

## 2024-01-16 ENCOUNTER — Inpatient Hospital Stay (HOSPITAL_COMMUNITY)

## 2024-01-16 DIAGNOSIS — I69398 Other sequelae of cerebral infarction: Secondary | ICD-10-CM | POA: Diagnosis not present

## 2024-01-16 DIAGNOSIS — G40901 Epilepsy, unspecified, not intractable, with status epilepticus: Secondary | ICD-10-CM | POA: Diagnosis not present

## 2024-01-16 DIAGNOSIS — Z91148 Patient's other noncompliance with medication regimen for other reason: Secondary | ICD-10-CM | POA: Diagnosis not present

## 2024-01-16 LAB — BASIC METABOLIC PANEL WITH GFR
Anion gap: 13 (ref 5–15)
Anion gap: 11 (ref 5–15)
BUN: 8 mg/dL (ref 8–23)
BUN: 9 mg/dL (ref 8–23)
CO2: 20 mmol/L — ABNORMAL LOW (ref 22–32)
CO2: 19 mmol/L — ABNORMAL LOW (ref 22–32)
Calcium: 8.6 mg/dL — ABNORMAL LOW (ref 8.9–10.3)
Calcium: 8.7 mg/dL — ABNORMAL LOW (ref 8.9–10.3)
Chloride: 104 mmol/L (ref 98–111)
Chloride: 111 mmol/L (ref 98–111)
Creatinine, Ser: 0.94 mg/dL (ref 0.61–1.24)
Creatinine, Ser: 0.76 mg/dL (ref 0.61–1.24)
GFR, Estimated: >60 mL/min (ref >60)
GFR, Estimated: >60 mL/min (ref >60)
Glucose, Bld: 88 mg/dL (ref 70–99)
Glucose, Bld: 120 mg/dL — ABNORMAL HIGH (ref 70–99)
Potassium: 3.7 mmol/L (ref 3.5–5.1)
Potassium: 3.7 mmol/L (ref 3.5–5.1)
Sodium: 137 mmol/L (ref 135–145)
Sodium: 141 mmol/L (ref 135–145)

## 2024-01-16 LAB — GLUCOSE, CAPILLARY
Glucose-Capillary: 76 mg/dL (ref 70–99)
Glucose-Capillary: 80 mg/dL (ref 70–99)
Glucose-Capillary: 84 mg/dL (ref 70–99)
Glucose-Capillary: 106 mg/dL — ABNORMAL HIGH (ref 70–99)
Glucose-Capillary: 104 mg/dL — ABNORMAL HIGH (ref 70–99)
Glucose-Capillary: 138 mg/dL — ABNORMAL HIGH (ref 70–99)

## 2024-01-16 LAB — CBC
HCT: 37.9 % — ABNORMAL LOW (ref 39.0–52.0)
Hemoglobin: 12.6 g/dL — ABNORMAL LOW (ref 13.0–17.0)
MCH: 28.3 pg (ref 26.0–34.0)
MCHC: 33.2 g/dL (ref 30.0–36.0)
MCV: 85 fL (ref 80.0–100.0)
Platelets: 211 10*3/uL (ref 150–400)
RBC: 4.46 MIL/uL (ref 4.22–5.81)
RDW: 12.8 % (ref 11.5–15.5)
WBC: 9.2 10*3/uL (ref 4.0–10.5)
nRBC: 0 % (ref 0.0–0.2)

## 2024-01-16 LAB — OSMOLALITY, URINE: Osmolality, Ur: 430 mosm/kg (ref 300–900)

## 2024-01-16 LAB — LACTIC ACID, PLASMA: Lactic Acid, Venous: 2.7 mmol/L (ref 0.5–1.9)

## 2024-01-16 LAB — TRIGLYCERIDES: Triglycerides: 179 mg/dL — ABNORMAL HIGH (ref <150)

## 2024-01-16 MED ORDER — DEXMEDETOMIDINE HCL IN NACL 400 MCG/100ML IV SOLN
INTRAVENOUS | Status: AC
  Filled 2024-01-16: qty 100

## 2024-01-16 MED ORDER — ACETAMINOPHEN 10 MG/ML IV SOLN
1000.0000 mg | Freq: Four times a day (QID) | INTRAVENOUS | Status: DC
  Administered 2024-01-16: 1000 mg via INTRAVENOUS
  Filled 2024-01-16: qty 100

## 2024-01-16 MED ORDER — AMLODIPINE BESYLATE 5 MG PO TABS
5.0000 mg | ORAL_TABLET | Freq: Every day | ORAL | Status: DC
  Administered 2024-01-16 – 2024-01-18 (×3): 5 mg
  Filled 2024-01-16 (×3): qty 1

## 2024-01-16 MED ORDER — DEXMEDETOMIDINE HCL IN NACL 400 MCG/100ML IV SOLN
0.0000 ug/kg/h | INTRAVENOUS | Status: DC
  Administered 2024-01-16: 0.4 ug/kg/h via INTRAVENOUS

## 2024-01-16 MED ORDER — JEVITY 1.5 CAL/FIBER PO LIQD
1000.0000 mL | ORAL | Status: DC
  Administered 2024-01-16: 1000 mL
  Filled 2024-01-16 (×5): qty 1000

## 2024-01-16 MED ORDER — OLANZAPINE 10 MG IM SOLR
5.0000 mg | Freq: Once | INTRAMUSCULAR | Status: AC | PRN
  Administered 2024-01-16: 5 mg via INTRAMUSCULAR
  Filled 2024-01-16: qty 10

## 2024-01-16 MED ORDER — PROSOURCE TF20 ENFIT COMPATIBL EN LIQD
60.0000 mL | Freq: Every day | ENTERAL | Status: DC
  Administered 2024-01-16 – 2024-01-18 (×3): 60 mL
  Filled 2024-01-16 (×3): qty 60

## 2024-01-16 MED ORDER — JEVITY 1.5 CAL/FIBER PO LIQD
1000.0000 mL | ORAL | Status: DC
  Filled 2024-01-16: qty 1000

## 2024-01-16 MED ORDER — ORAL CARE MOUTH RINSE: 15.0000 mL | OROMUCOSAL | Status: DC | PRN

## 2024-01-16 MED ORDER — HYDRALAZINE HCL 20 MG/ML IJ SOLN
5.0000 mg | INTRAMUSCULAR | Status: DC | PRN
  Administered 2024-01-16 – 2024-01-17 (×2): 10 mg via INTRAVENOUS
  Administered 2024-01-19: 5 mg via INTRAVENOUS
  Filled 2024-01-16 (×3): qty 1

## 2024-01-16 MED ORDER — ORAL CARE MOUTH RINSE
15.0000 mL | OROMUCOSAL | Status: DC
  Administered 2024-01-16 – 2024-01-20 (×13): 15 mL via OROMUCOSAL

## 2024-01-16 MED ORDER — LABETALOL HCL 5 MG/ML IV SOLN
5.0000 mg | INTRAVENOUS | Status: DC | PRN
  Administered 2024-01-16 – 2024-01-19 (×4): 10 mg via INTRAVENOUS
  Filled 2024-01-16 (×4): qty 4
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
## 2024-01-16 NOTE — Progress Notes (Signed)
 eLink Physician-Brief Progress Note Patient Name: Leonard [name] DOB: Dec 28, 1955 MRN: [id number]   Date of Service  01/16/2024  HPI/Events of Note  Patient with sub-optimal BP control. She also has a high urine output.  eICU Interventions  BMP now and Q 4 hourly x 6, urine osmolality, PRN iv Hydralazine for SBP > 160.        Thomasene Lot Dmario Russom 01/16/2024, 9:26 PM

## 2024-01-16 NOTE — Progress Notes (Signed)
 Initial Nutrition Assessment  DOCUMENTATION CODES:   Not applicable  INTERVENTION:  Initiate tube feeding via Cortrak once placed and confirmed: Jevity 1.5 at 55 ml/h (1320 ml per day) Prosource TF20 60 ml daily  Provides 2060 kcal, 104 gm protein, 1003 ml free water daily  NUTRITION DIAGNOSIS:   Inadequate oral intake related to acute illness as evidenced by NPO status.   GOAL:   Patient will meet greater than or equal to 90% of their needs   MONITOR:   Diet advancement, Labs, I & O's  REASON FOR ASSESSMENT:   Consult Assessment of nutrition requirement/status, Enteral/tube feeding initiation and management  ASSESSMENT:  68 y.o male who presented to the ED as code stroke with R sided weakness, left gaze preference and aphasia. 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 PMH: Prior stroke with residual left sided weakness, GERD, noncompliance wiht medicines.  4/10 - Intubated, no stroke. MRI:No acute abnormality. 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 Has been either very agitated or asleep per RN. Not following commands. Family at bedside report he had a prior stroke 3 years ago but has been eating fine since then with no weight loss noted. Does have weakness on his left side, on exam patients left hand swollen but is otherwise well-nourished.    Did have cortrak with his stroke 3 years ago and he pulled it out. While here patient had to be placed in restraints for trying to pull his ET tube. Family agreeable to tube feeding if unable to pass swallow screen today. Per RN unable to complete swallow screen due to agitation or being asleep. Will start tube feeds, recommend patient be placed in restraints while tube feeds infusing.    Temp (24hrs), Avg:99.7 F (37.6 C), Min:98.6 F (37 C), Max:101.3 F (38.5 C)  MAP (cuff):  Admit  weight: 79.6 kg   Current weight: 82.6 kg    Intake/Output Summary (Last 24 hours) at 01/16/2024 1458 Last data filed at 01/16/2024 1400 Gross per 24 hour  Intake 3046.18 ml  Output 2725 ml  Net 321.18 ml   Net IO Since Admission: 342.68 mL [01/16/24 1458]  Nutritionally Relevant Medications: Scheduled Meds:  Chlorhexidine Gluconate Cloth  6 each Topical Daily   feeding supplement (PROSource TF20)  60 mL Per Tube Daily   heparin  5,000 Units Subcutaneous Q8H   insulin aspart  0-15 Units Subcutaneous Q4H   mouth rinse  15 mL Mouth Rinse 4 times per day   pantoprazole (PROTONIX) IV  40 mg Intravenous QHS   Continuous Infusions:  acetaminophen Stopped (01/16/24 1218)   feeding supplement (JEVITY 1.5 CAL/FIBER)     lactated ringers 50 mL/hr at 01/16/24 1400   levETIRAcetam Stopped (01/16/24 1055)   Labs Reviewed: Calcium 8.6 CBG ranges from 76-105 mg/dL over the last 24 hours HgbA1c 5.2  NUTRITION - FOCUSED PHYSICAL EXAM:  Flowsheet Row Most Recent Value  Orbital Region No depletion  Upper Arm Region No depletion  Thoracic and Lumbar Region No depletion  Buccal Region No depletion  Temple Region No depletion  Clavicle Bone Region No depletion  Scapular Bone Region No depletion  Dorsal Hand No depletion  [left hand swollen]  Patellar Region Mild depletion  Anterior Thigh Region Mild depletion  Posterior Calf Region Unable to assess  [cuffs]  Edema (RD Assessment) None  Hair Reviewed  Eyes Unable to  assess  Mouth Unable to assess  Skin Reviewed  Nails Reviewed       Diet Order:   Diet Order             Diet NPO time specified  Diet effective now                   EDUCATION NEEDS:   Not appropriate for education at this time  Skin:  Skin Assessment: Reviewed RN Assessment  Last BM:  PTA  Height:   Ht Readings from Last 1 Encounters:  01/15/24 5\' 10"  (1.778 m)    Weight:   Wt Readings from Last 1 Encounters:  01/16/24 82.6 kg    Ideal  Body Weight:  68.2 kg  BMI:  Body mass index is 26.13 kg/m.  Estimated Nutritional Needs:   Kcal:  2000-2200 kcal  Protein:  100-120 gm  Fluid:  >/=2L/day  Elliot Dally, RD Registered Dietitian  See Amion for more information

## 2024-01-16 NOTE — TOC CM/SW Note (Signed)
 Transition of Care [facility]) - Inpatient Brief Assessment   Patient Details  Name: Leonard [name] MRN: [id number] Date of Birth: [birth year]  Transition of Care [facility]) CM/SW Contact:    Glennon Mac, RN Phone Number: 01/16/2024, 5:09 PM   Clinical Narrative: 68 year old man who presented with stroke symptoms and severe hypertension.  Patient extubated this morning.  PT/OT evaluations pending.   TOC will follow for discharge planning; please consult if any needs arise.    Transition of Care Asessment: Insurance and Status: Insurance coverage has been reviewed Patient has primary care physician: Yes Arlyss Repress Abernathy) Home environment has been reviewed: Lives with spouse Prior level of function:: needs assist   Social Drivers of Health Review: SDOH reviewed no interventions necessary Readmission risk has been reviewed: Yes Transition of care needs: no transition of care needs at this time   Quintella Baton, RN, BSN  Trauma/Neuro ICU Case Manager [phone]

## 2024-01-16 NOTE — Plan of Care (Signed)

## 2024-01-16 NOTE — Progress Notes (Signed)
 Clarified BP goal with e-Link MD and Neuro MD, both okay with less than 180.

## 2024-01-16 NOTE — Progress Notes (Signed)
 eLink Physician-Brief Progress Note Patient Name: Leonard [name] DOB: Sep 18, 1956 MRN: [id number]   Date of Service  01/16/2024  HPI/Events of Note  Patient is reaching for the ET tube with his right hand.  eICU Interventions  Right soft wrist restraints ordered to prevent self-extubation.        Thomasene Lot Carlas Vandyne 01/16/2024, 5:38 AM

## 2024-01-16 NOTE — Progress Notes (Signed)
 LTM EEG discontinued - no skin breakdown at [facility].

## 2024-01-16 NOTE — Progress Notes (Signed)
 NAME:  Leonard [name], MRN:  [id number], DOB:  12/25/1955, LOS: 1 ADMISSION DATE:  01/15/2024, CONSULTATION DATE:  01/15/24 REFERRING MD:  Criss Alvine, CHIEF COMPLAINT:  AMS   History of Present Illness:  68 yo M PMH HTN, medication non-adherence, DNR status (pre-arrest interventions desired), GERD, prior stroke w residual R sided wknss, who presented to ED 4/10 w R sided weakness, L gaze preference and aphasia. Brought in as code stroke. LKW 4/10 0800. He was combative in ED and required intubation to facilitate scans. Additionally was hypertensive SBP > 200 requiring cleviprex.  CT H, CTA head/neck did not reveal ICH/acute ischemic stroke / LVO.  Clinical suspicion growing for seizure. Keppra ordered, cEEG ordered, MRI brain ordered & PCCM asked to admit.    In ED d/w wife who shares that pt does not take his home medications (statin, norvasc). Also shares that he has a will which specifies DNR. All pre-arrest interventions ok at present.  Pertinent  Medical History  CVA HTN GERD  Significant Hospital Events: Including procedures, antibiotic start and stop dates in addition to other pertinent events   4/10 AMS weakness gaze deviation. Intubated in ED. No stroke. Admit to PCCM c/f seizures   Interim History / Subjective:  Restrained after he tried to pull out his ETT  No sz on cEEG   Objective   Blood pressure (!) 162/68, pulse 61, temperature 100.2 F (37.9 C), resp. rate 18, height 5\' 10"  (1.778 m), weight 82.6 kg, SpO2 99%.    Vent Mode: PRVC FiO2 (%):  [40 %-100 %] 40 % Set Rate:  [18 bmp] 18 bmp Vt Set:  [580 mL] 580 mL PEEP:  [5 cmH20] 5 cmH20 Plateau Pressure:  [15 cmH20-21 cmH20] 18 cmH20   Intake/Output Summary (Last 24 hours) at 01/16/2024 1043 Last data filed at 01/16/2024 1000 Gross per 24 hour  Intake 2645.67 ml  Output 2275 ml  Net 370.67 ml   Filed Weights   01/15/24 1230 01/15/24 1848 01/16/24 0500  Weight: 79.6 kg 79.8 kg 82.6 kg    Examination: General:  Chronically ill elderly M NAD  Neuro: sedated, moving spontaneously  HENT: ETT anicteric sclera  Lungs: CTAb mechanically ventilated  Cardiovascular: rrr Abdomen: round ntnd  Extremities: no acute joint deformity  GU: foley   Resolved Hospital Problem list     Assessment & Plan:  Acute respiratory insufficiency / Endotracheally intubated -combative in ED, tubed to facilitate imaging P -WUA/SBT when okay to wean sedation -- hopefully 4/11 extubation  -VAP, pulm hygiene -PRN CXR   Acute encephalopathy Suspected seizure Hx R MCA CVA with residual L sided weakness  -MRI 4/10  w/o evidence of PRES -- no acute abnormality. Old R MCA infarct, underlying chronic microvascular ischemic dz -cEEG 4/10-11 without seizure  P -admit to ICU  -keppra. AEDs per neuro -sz precautions  -wean sedation when ok with neuro   HTN emergency HTN, poorly controlled P -weaned off cleviprex  -expect as sedation is weaned his BP Philipson rise some -goal would 140-160 4/11, then plan for < 140  -when appropriate would order previous home med amlodipine   Elevated Lactic acid, improving -follow PRN   Inadequate PO intake Borderline hypokalemia P -replace K -if unable to extubate 4/11 will start EN   Medication non-adherence  -is not compliant with previously Rx amlodipine, lovastatin, ASA per wife. Last time I am seeing these Rx is 2023. Does not look like he has routinely followed with providers since 2023 other than  PT/OT (through 2024) P -will need to discuss importance of resuming/maintaining home meds when appropriate   Elevated triglycerides -on prop infusion vs chronic  P -hopefully will be able to come off sedation soon  DNR status -confirmed w wife at time of admission  after pt had been intubated.  Discussed again 4/11 as we approach extubation, they indicate DNR/I status. Discussed this in detail. Will update accordingly   Best Practice (right click and "Reselect all SmartList  Selections" daily)   Diet/type: NPO DVT prophylaxis prophylactic heparin  Pressure ulcer(s): pressure ulcer assessment deferred  GI prophylaxis: PPI Lines: N/A Foley:  Yes, and it is still needed Code Status:  DNR Last date of multidisciplinary goals of care discussion [4/11]  Labs   CBC: Recent Labs  Lab 01/15/24 1210 01/15/24 1212 01/15/24 1422 01/16/24 0532  WBC 9.6  --   --  9.2  NEUTROABS 5.7  --   --   --   HGB 15.1 15.6 16.7 12.6*  HCT 45.9 46.0 49.0 37.9*  MCV 84.7  --   --  85.0  PLT 297  --   --  211    Basic Metabolic Panel: Recent Labs  Lab 01/15/24 1210 01/15/24 1212 01/15/24 1422 01/16/24 0532  NA 135 135 133* 137  K 4.1 4.2 3.8 3.7  CL 101 101  --  104  CO2 24  --   --  20*  GLUCOSE 107* 100*  --  88  BUN 6* 7*  --  8  CREATININE 0.76 0.80  --  0.94  CALCIUM 8.8*  --   --  8.6*  MG 2.1  --   --   --    GFR: Estimated Creatinine Clearance: 77.7 mL/min (by C-G formula based on SCr of 0.94 mg/dL). Recent Labs  Lab 01/15/24 1210 01/15/24 2105 01/16/24 0532  WBC 9.6  --  9.2  LATICACIDVEN  --  3.8* 2.7*    Liver Function Tests: Recent Labs  Lab 01/15/24 1210  AST 21  ALT 19  ALKPHOS 70  BILITOT 1.1  PROT 7.4  ALBUMIN 4.2   No results for input(s): "LIPASE", "AMYLASE" in the last 168 hours. No results for input(s): "AMMONIA" in the last 168 hours.  ABG    Component Value Date/Time   PHART 7.357 01/15/2024 1422   PCO2ART 40.8 01/15/2024 1422   PO2ART 428 (H) 01/15/2024 1422   HCO3 22.9 01/15/2024 1422   TCO2 24 01/15/2024 1422   ACIDBASEDEF 3.0 (H) 01/15/2024 1422   O2SAT 100 01/15/2024 1422     Coagulation Profile: Recent Labs  Lab 01/15/24 1210  INR 1.0    Cardiac Enzymes: No results for input(s): "CKTOTAL", "CKMB", "CKMBINDEX", "TROPONINI" in the last 168 hours.  HbA1C: Hemoglobin A1C  Date/Time Value Ref Range Status  09/12/2021 02:02 PM 5.4 4.0 - 5.6 % Final   Hgb A1c MFr Bld  Date/Time Value Ref Range  Status  01/15/2024 12:10 PM 5.2 4.8 - 5.6 % Final    Comment:    (NOTE) Pre diabetes:          5.7%-6.4%  Diabetes:              >6.4%  Glycemic control for   <7.0% adults with diabetes   01/25/2021 03:42 AM 5.8 (H) 4.8 - 5.6 % Final    Comment:    (NOTE) Pre diabetes:          5.7%-6.4%  Diabetes:              >  6.4%  Glycemic control for   <7.0% adults with diabetes     CBG: Recent Labs  Lab 01/15/24 1658 01/15/24 1926 01/15/24 2323 01/16/24 0321 01/16/24 0727  GLUCAP 100* 81 82 76 80    CRITICAL CARE Performed by: Lanier Clam   Total critical care time: 43 minutes  Critical care time was exclusive of separately billable procedures and treating other patients. Critical care was necessary to treat or prevent imminent or life-threatening deterioration.  Critical care was time spent personally by me on the following activities: development of treatment plan with patient and/or surrogate as well as nursing, discussions with consultants, evaluation of patient's response to treatment, examination of patient, obtaining history from patient or surrogate, ordering and performing treatments and interventions, ordering and review of laboratory studies, ordering and review of radiographic studies, pulse oximetry and re-evaluation of patient's condition.  Tessie Fass MSN, AGACNP-BC [facility] Pulmonary/Critical Care Medicine Amion for pager  01/16/2024, 10:43 AM

## 2024-01-16 NOTE — Progress Notes (Signed)
 This RN and another RN entered the room to reposition patient. Patient became extremely agitated and began attempts to kick both RNs in the head, and also attempted to punch and bite RNs. Several other staff members entered the room to assist and the patient continued attempts to hit, punch, and kick. E-Link button pressed as there were no PRN orders for agitation. Order placed for Zyprexa. Medication had to be sent from pharmacy so precedex ordered in the meantime. Zyprexa administered as soon as it was received.

## 2024-01-16 NOTE — Procedures (Signed)
 Patient Name: [name]  MRN: [id number]  Epilepsy Attending: Charlsie Quest  Referring Physician/Provider: Elmer Picker, NP  Duration: 01/15/2024 1442 to 01/16/2024 0530  Patient history: 68 y.o. male with a past medical history of stroke with residual left sided weakness presenting with altered mental status, global aphasia, right sided weakness. EEG to evaluate for seizure  Level of alertness:  comatose/ lethargic  AEDs during EEG study: LEV, propofol  Technical aspects: This EEG study was done with scalp electrodes positioned according to the 10-20 International system of electrode placement. Electrical activity was reviewed with band pass filter of 1-70Hz , sensitivity of 7 uV/mm, display speed of 6mm/sec with a 60Hz  notched filter applied as appropriate. EEG data were recorded continuously and digitally stored.  Video monitoring was available and reviewed as appropriate.  Description: EEG showed continuous generalized 3 to 6 Hz theta-delta slowing admixed with 12-15Hz  beta activity. Hyperventilation and photic stimulation were not performed.     EEG was disconnected between 01/15/2024 1721 to 1915 for MR brain.   ABNORMALITY - Continuous slow, generalized  IMPRESSION: This study is suggestive of moderate diffuse encephalopathy, nonspecific etiology but likely related to sedation. No seizures or epileptiform discharges were seen throughout the recording.  Leonard [name]

## 2024-01-16 NOTE — Progress Notes (Signed)
 NEUROLOGY CONSULT FOLLOW UP NOTE   Date of service: January 16, 2024 Patient Name: Leonard [name] MRN:  [id number] DOB:  1955/11/13  Interval Hx/subjective   Patient has had no further seizures on EEG, sedation is being lightened with hope for extubation.  Vitals   Vitals:   01/16/24 0739 01/16/24 0800 01/16/24 0900 01/16/24 1000  BP:  131/63 (!) 116/57 (!) 162/68  Pulse: 65 60 61 61  Resp: 18 18 18 18   Temp: 99.3 F (37.4 C) 99.5 F (37.5 C) 99.9 F (37.7 C) 100.2 F (37.9 C)  TempSrc:      SpO2: 100% 99% 99% 99%  Weight:      Height:         Body mass index is 26.13 kg/m.  Physical Exam   Intubated and sedated  Neurologic Examination    MS: With noxious stimulation, he does partially open his eyes, he does not follow commands. CN: Pupils are reactive bilaterally, eyes are midline Motor: He has some mild spasticity on the left, but moves all extremities to noxious stimulation Sensory: As above  Medications  Current Facility-Administered Medications:    acetaminophen (TYLENOL) tablet 650 mg, 650 mg, Per Tube, Q4H PRN, Bowser, Kaylyn Layer, NP   Chlorhexidine Gluconate Cloth 2 % PADS 6 each, 6 each, Topical, Daily, Lorin Glass, MD, 6 each at 01/15/24 2325   docusate (COLACE) 50 MG/5ML liquid 100 mg, 100 mg, Per Tube, BID PRN, Bowser, Kaylyn Layer, NP   fentaNYL (SUBLIMAZE) bolus via infusion 50-100 mcg, 50-100 mcg, Intravenous, Q15 min PRN, Lorin Glass, MD   fentaNYL (SUBLIMAZE) injection 25 mcg, 25 mcg, Intravenous, Q15 min PRN, Bowser, Kaylyn Layer, NP   fentaNYL (SUBLIMAZE) injection 25-100 mcg, 25-100 mcg, Intravenous, Q30 min PRN, Bowser, Grace E, NP   fentaNYL in NS (74mcg/ml) infusion-PREMIX, 50-200 mcg/hr, Intravenous, Continuous, Lorin Glass, MD, Last Rate: 2.5 mL/hr at 01/16/24 1000, 25 mcg/hr at 01/16/24 1000   heparin injection 5,000 Units, 5,000 Units, Subcutaneous, Q8H, Bowser, Grace E, NP, 5,000 Units at 01/16/24 0512   insulin aspart  (novoLOG) injection 0-15 Units, 0-15 Units, Subcutaneous, Q4H, Bowser, Kaylyn Layer, NP   lactated ringers infusion, , Intravenous, Continuous, Bowser, Grace E, NP, Last Rate: 50 mL/hr at 01/16/24 1000, Infusion Verify at 01/16/24 1000   levETIRAcetam (KEPPRA) IVPB 500 mg/100 mL premix, 500 mg, Intravenous, BID, Shafer, Devon, NP, Stopped at 01/15/24 2300   midazolam (VERSED) injection 1-2 mg, 1-2 mg, Intravenous, Q1H PRN, Bowser, Grace E, NP   ondansetron (ZOFRAN) injection 4 mg, 4 mg, Intravenous, Q6H PRN, Bowser, Kaylyn Layer, NP   Oral care mouth rinse, 15 mL, Mouth Rinse, Q2H, Lorin Glass, MD, 15 mL at 01/16/24 0914   Oral care mouth rinse, 15 mL, Mouth Rinse, PRN, Lorin Glass, MD   pantoprazole (PROTONIX) injection 40 mg, 40 mg, Intravenous, QHS, Bowser, Grace E, NP, 40 mg at 01/15/24 2159   polyethylene glycol (MIRALAX / GLYCOLAX) packet 17 g, 17 g, Per Tube, Daily PRN, Bowser, Kaylyn Layer, NP   propofol (DIPRIVAN) 1000 MG/100ML infusion, 0-50 mcg/kg/min, Intravenous, Continuous, Bowser, Grace E, NP, Last Rate: 4.78 mL/hr at 01/16/24 1000, 10 mcg/kg/min at 01/16/24 1000  Labs and Diagnostic Imaging   Results for orders placed or performed during the hospital encounter of 01/15/24 (from the past 24 hours)  Urine rapid drug screen (hosp performed)     Status: Abnormal   Collection Time: 01/15/24 12:04 PM  Result Value Ref Range  Opiates NONE DETECTED NONE DETECTED   Cocaine NONE DETECTED NONE DETECTED   Benzodiazepines POSITIVE (A) NONE DETECTED   Amphetamines NONE DETECTED NONE DETECTED   Tetrahydrocannabinol NONE DETECTED NONE DETECTED   Barbiturates NONE DETECTED NONE DETECTED  CBG monitoring, ED     Status: None   Collection Time: 01/15/24 12:05 PM  Result Value Ref Range   Glucose-Capillary 97 70 - 99 mg/dL  Ethanol     Status: None   Collection Time: 01/15/24 12:10 PM  Result Value Ref Range   Alcohol, Ethyl (B) <10 <10 mg/dL  Protime-INR     Status: None   Collection Time:  01/15/24 12:10 PM  Result Value Ref Range   Prothrombin Time 13.3 11.4 - 15.2 seconds   INR 1.0 0.8 - 1.2  APTT     Status: None   Collection Time: 01/15/24 12:10 PM  Result Value Ref Range   aPTT 31 24 - 36 seconds  CBC     Status: None   Collection Time: 01/15/24 12:10 PM  Result Value Ref Range   WBC 9.6 4.0 - 10.5 K/uL   RBC 5.42 4.22 - 5.81 MIL/uL   Hemoglobin 15.1 13.0 - 17.0 g/dL   HCT 78.4 69.6 - 29.5 %   MCV 84.7 80.0 - 100.0 fL   MCH 27.9 26.0 - 34.0 pg   MCHC 32.9 30.0 - 36.0 g/dL   RDW 28.4 13.2 - 44.0 %   Platelets 297 150 - 400 K/uL   nRBC 0.0 0.0 - 0.2 %  Differential     Status: Abnormal   Collection Time: 01/15/24 12:10 PM  Result Value Ref Range   Neutrophils Relative % 59 %   Neutro Abs 5.7 1.7 - 7.7 K/uL   Lymphocytes Relative 25 %   Lymphs Abs 2.4 0.7 - 4.0 K/uL   Monocytes Relative 13 %   Monocytes Absolute 1.2 (H) 0.1 - 1.0 K/uL   Eosinophils Relative 2 %   Eosinophils Absolute 0.2 0.0 - 0.5 K/uL   Basophils Relative 1 %   Basophils Absolute 0.1 0.0 - 0.1 K/uL   Immature Granulocytes 0 %   Abs Immature Granulocytes 0.03 0.00 - 0.07 K/uL  Comprehensive metabolic panel     Status: Abnormal   Collection Time: 01/15/24 12:10 PM  Result Value Ref Range   Sodium 135 135 - 145 mmol/L   Potassium 4.1 3.5 - 5.1 mmol/L   Chloride 101 98 - 111 mmol/L   CO2 24 22 - 32 mmol/L   Glucose, Bld 107 (H) 70 - 99 mg/dL   BUN 6 (L) 8 - 23 mg/dL   Creatinine, Ser 1.02 0.61 - 1.24 mg/dL   Calcium 8.8 (L) 8.9 - 10.3 mg/dL   Total Protein 7.4 6.5 - 8.1 g/dL   Albumin 4.2 3.5 - 5.0 g/dL   AST 21 15 - 41 U/L   ALT 19 0 - 44 U/L   Alkaline Phosphatase 70 38 - 126 U/L   Total Bilirubin 1.1 0.0 - 1.2 mg/dL   GFR, Estimated >72 >53 mL/min   Anion gap 10 5 - 15  Hemoglobin A1c     Status: None   Collection Time: 01/15/24 12:10 PM  Result Value Ref Range   Hgb A1c MFr Bld 5.2 4.8 - 5.6 %   Mean Plasma Glucose 102.54 mg/dL  Magnesium     Status: None   Collection  Time: 01/15/24 12:10 PM  Result Value Ref Range   Magnesium 2.1 1.7 - 2.4  mg/dL  I-stat chem 8, ED     Status: Abnormal   Collection Time: 01/15/24 12:12 PM  Result Value Ref Range   Sodium 135 135 - 145 mmol/L   Potassium 4.2 3.5 - 5.1 mmol/L   Chloride 101 98 - 111 mmol/L   BUN 7 (L) 8 - 23 mg/dL   Creatinine, Ser 4.78 0.61 - 1.24 mg/dL   Glucose, Bld 295 (H) 70 - 99 mg/dL   Calcium, Ion 6.21 (L) 1.15 - 1.40 mmol/L   TCO2 23 22 - 32 mmol/L   Hemoglobin 15.6 13.0 - 17.0 g/dL   HCT 30.8 65.7 - 84.6 %  I-Stat arterial blood gas, ED     Status: Abnormal   Collection Time: 01/15/24  2:22 PM  Result Value Ref Range   pH, Arterial 7.357 7.35 - 7.45   pCO2 arterial 40.8 32 - 48 mmHg   pO2, Arterial 428 (H) 83 - 108 mmHg   Bicarbonate 22.9 20.0 - 28.0 mmol/L   TCO2 24 22 - 32 mmol/L   O2 Saturation 100 %   Acid-base deficit 3.0 (H) 0.0 - 2.0 mmol/L   Sodium 133 (L) 135 - 145 mmol/L   Potassium 3.8 3.5 - 5.1 mmol/L   Calcium, Ion 1.16 1.15 - 1.40 mmol/L   HCT 49.0 39.0 - 52.0 %   Hemoglobin 16.7 13.0 - 17.0 g/dL   Patient temperature 96.2 F    Sample type ARTERIAL   CBG monitoring, ED     Status: Abnormal   Collection Time: 01/15/24  3:15 PM  Result Value Ref Range   Glucose-Capillary 105 (H) 70 - 99 mg/dL  Urinalysis, Routine w reflex microscopic -Urine, Clean Catch     Status: Abnormal   Collection Time: 01/15/24  3:27 PM  Result Value Ref Range   Color, Urine COLORLESS (A) YELLOW   APPearance CLEAR CLEAR   Specific Gravity, Urine 1.013 1.005 - 1.030   pH 9.0 (H) 5.0 - 8.0   Glucose, UA NEGATIVE NEGATIVE mg/dL   Hgb urine dipstick NEGATIVE NEGATIVE   Bilirubin Urine NEGATIVE NEGATIVE   Ketones, ur NEGATIVE NEGATIVE mg/dL   Protein, ur NEGATIVE NEGATIVE mg/dL   Nitrite NEGATIVE NEGATIVE   Leukocytes,Ua NEGATIVE NEGATIVE  CBG monitoring, ED     Status: Abnormal   Collection Time: 01/15/24  4:58 PM  Result Value Ref Range   Glucose-Capillary 100 (H) 70 - 99 mg/dL  MRSA  Next Gen by PCR, Nasal     Status: None   Collection Time: 01/15/24  6:57 PM   Specimen: Nasal Mucosa; Nasal Swab  Result Value Ref Range   MRSA by PCR Next Gen NOT DETECTED NOT DETECTED  Glucose, capillary     Status: None   Collection Time: 01/15/24  7:26 PM  Result Value Ref Range   Glucose-Capillary 81 70 - 99 mg/dL  Lactic acid, plasma     Status: Abnormal   Collection Time: 01/15/24  9:05 PM  Result Value Ref Range   Lactic Acid, Venous 3.8 (HH) 0.5 - 1.9 mmol/L  HIV Antibody (routine testing w rflx)     Status: None   Collection Time: 01/15/24  9:05 PM  Result Value Ref Range   HIV Screen 4th Generation wRfx Non Reactive Non Reactive  Glucose, capillary     Status: None   Collection Time: 01/15/24 11:23 PM  Result Value Ref Range   Glucose-Capillary 82 70 - 99 mg/dL  Glucose, capillary     Status: None  Collection Time: 01/16/24  3:21 AM  Result Value Ref Range   Glucose-Capillary 76 70 - 99 mg/dL  CBC     Status: Abnormal   Collection Time: 01/16/24  5:32 AM  Result Value Ref Range   WBC 9.2 4.0 - 10.5 K/uL   RBC 4.46 4.22 - 5.81 MIL/uL   Hemoglobin 12.6 (L) 13.0 - 17.0 g/dL   HCT 45.4 (L) 09.8 - 11.9 %   MCV 85.0 80.0 - 100.0 fL   MCH 28.3 26.0 - 34.0 pg   MCHC 33.2 30.0 - 36.0 g/dL   RDW 14.7 82.9 - 56.2 %   Platelets 211 150 - 400 K/uL   nRBC 0.0 0.0 - 0.2 %  Basic metabolic panel     Status: Abnormal   Collection Time: 01/16/24  5:32 AM  Result Value Ref Range   Sodium 137 135 - 145 mmol/L   Potassium 3.7 3.5 - 5.1 mmol/L   Chloride 104 98 - 111 mmol/L   CO2 20 (L) 22 - 32 mmol/L   Glucose, Bld 88 70 - 99 mg/dL   BUN 8 8 - 23 mg/dL   Creatinine, Ser 1.30 0.61 - 1.24 mg/dL   Calcium 8.6 (L) 8.9 - 10.3 mg/dL   GFR, Estimated >86 >57 mL/min   Anion gap 13 5 - 15  Lactic acid, plasma     Status: Abnormal   Collection Time: 01/16/24  5:32 AM  Result Value Ref Range   Lactic Acid, Venous 2.7 (HH) 0.5 - 1.9 mmol/L  Triglycerides     Status: Abnormal    Collection Time: 01/16/24  5:32 AM  Result Value Ref Range   Triglycerides 179 (H) <150 mg/dL  Glucose, capillary     Status: None   Collection Time: 01/16/24  7:27 AM  Result Value Ref Range   Glucose-Capillary 80 70 - 99 mg/dL     Assessment   Leonard [name] is a 68 y.o. male who presented with refractory status epilepticus requiring intubation.  He has not had any further seizures since EEG has been connected, and I agree with weaning sedation.  He is noncompliant with all of his medications, and we will need to stress need for compliance, especially with antiepileptics moving forward.  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  Recommendations  Continue Keppra 500 mg twice daily Agree with weaning sedation Neurology will continue to follow. ______________________________________________________________________   Stormy Card, MD [facility]

## 2024-01-16 NOTE — Procedures (Signed)
 Cortrak  Tube Type:  Cortrak - 43 inches Tube Location:  Left nare Initial Placement:  Stomach Secured by: Bridle Cortrak Secured At:  65 cm   Cortrak Tube Team Note:  Consult received to place a Cortrak feeding tube.   X-ray is required. RN Pinch begin using tube once placement confirmed.   If the tube becomes dislodged please keep the tube and contact the Cortrak team at [email or web address] for replacement.  If after hours and replacement cannot be delayed, place a NG tube and confirm placement with an abdominal x-ray.   Betsey Holiday MS, RD, LDN If unable to be reached, please send secure chat to "RD inpatient" available from 8:00a-4:00p daily

## 2024-01-17 DIAGNOSIS — Z91148 Patient's other noncompliance with medication regimen for other reason: Secondary | ICD-10-CM | POA: Diagnosis not present

## 2024-01-17 DIAGNOSIS — I69398 Other sequelae of cerebral infarction: Secondary | ICD-10-CM | POA: Diagnosis not present

## 2024-01-17 DIAGNOSIS — R451 Restlessness and agitation: Secondary | ICD-10-CM | POA: Diagnosis not present

## 2024-01-17 DIAGNOSIS — G40901 Epilepsy, unspecified, not intractable, with status epilepticus: Secondary | ICD-10-CM | POA: Diagnosis not present

## 2024-01-17 LAB — BASIC METABOLIC PANEL WITH GFR
Anion gap: 11 (ref 5–15)
Anion gap: 14 (ref 5–15)
Anion gap: 14 (ref 5–15)
BUN: 10 mg/dL (ref 8–23)
BUN: 11 mg/dL (ref 8–23)
BUN: 12 mg/dL (ref 8–23)
CO2: 21 mmol/L — ABNORMAL LOW (ref 22–32)
CO2: 18 mmol/L — ABNORMAL LOW (ref 22–32)
CO2: 19 mmol/L — ABNORMAL LOW (ref 22–32)
Calcium: 8.7 mg/dL — ABNORMAL LOW (ref 8.9–10.3)
Calcium: 8.7 mg/dL — ABNORMAL LOW (ref 8.9–10.3)
Calcium: 8.8 mg/dL — ABNORMAL LOW (ref 8.9–10.3)
Chloride: 108 mmol/L (ref 98–111)
Chloride: 107 mmol/L (ref 98–111)
Chloride: 108 mmol/L (ref 98–111)
Creatinine, Ser: 0.92 mg/dL (ref 0.61–1.24)
Creatinine, Ser: 0.73 mg/dL (ref 0.61–1.24)
Creatinine, Ser: 0.81 mg/dL (ref 0.61–1.24)
GFR, Estimated: >60 mL/min (ref >60)
GFR, Estimated: >60 mL/min (ref >60)
GFR, Estimated: >60 mL/min (ref >60)
Glucose, Bld: 114 mg/dL — ABNORMAL HIGH (ref 70–99)
Glucose, Bld: 109 mg/dL — ABNORMAL HIGH (ref 70–99)
Glucose, Bld: 153 mg/dL — ABNORMAL HIGH (ref 70–99)
Potassium: 4.2 mmol/L (ref 3.5–5.1)
Potassium: 4.2 mmol/L (ref 3.5–5.1)
Potassium: 3.5 mmol/L (ref 3.5–5.1)
Sodium: 140 mmol/L (ref 135–145)
Sodium: 139 mmol/L (ref 135–145)
Sodium: 141 mmol/L (ref 135–145)

## 2024-01-17 LAB — GLUCOSE, CAPILLARY
Glucose-Capillary: 126 mg/dL — ABNORMAL HIGH (ref 70–99)
Glucose-Capillary: 119 mg/dL — ABNORMAL HIGH (ref 70–99)
Glucose-Capillary: 102 mg/dL — ABNORMAL HIGH (ref 70–99)
Glucose-Capillary: 118 mg/dL — ABNORMAL HIGH (ref 70–99)

## 2024-01-17 MED ORDER — VALPROATE SODIUM 100 MG/ML IV SOLN
1500.0000 mg | Freq: Once | INTRAVENOUS | Status: AC
  Administered 2024-01-17: 1500 mg via INTRAVENOUS
  Filled 2024-01-17: qty 15

## 2024-01-17 MED ORDER — DIVALPROEX SODIUM 500 MG PO DR TAB
500.0000 mg | DELAYED_RELEASE_TABLET | Freq: Two times a day (BID) | ORAL | Status: DC
  Administered 2024-01-17 – 2024-01-20 (×6): 500 mg via ORAL
  Filled 2024-01-17 (×7): qty 1

## 2024-01-17 MED ORDER — LISINOPRIL 20 MG PO TABS
20.0000 mg | ORAL_TABLET | Freq: Every day | ORAL | Status: DC
  Administered 2024-01-17 – 2024-01-18 (×2): 20 mg
  Filled 2024-01-17: qty 1

## 2024-01-17 MED ORDER — HYDROCHLOROTHIAZIDE 12.5 MG PO TABS
12.5000 mg | ORAL_TABLET | Freq: Every day | ORAL | Status: DC
  Administered 2024-01-17 – 2024-01-18 (×2): 12.5 mg
  Filled 2024-01-17: qty 1

## 2024-01-17 MED ORDER — HYDROCHLOROTHIAZIDE 12.5 MG PO TABS
12.5000 mg | ORAL_TABLET | Freq: Every day | ORAL | Status: DC
  Filled 2024-01-17: qty 1

## 2024-01-17 MED ORDER — LISINOPRIL 20 MG PO TABS
20.0000 mg | ORAL_TABLET | Freq: Every day | ORAL | Status: DC
  Filled 2024-01-17: qty 1

## 2024-01-17 NOTE — Evaluation (Signed)
 Physical Therapy Evaluation Patient Details Name: Leonard [name] MRN: [id number] DOB: 02/08/1956 Today's Date: 01/17/2024  History of Present Illness  Pt is a 68 y/o male presenting on 4/10 with AMS, L sided gaze, R sided weakness and global aphasia. Post intubation CT with encephalomalacia in R hemisphere from old CVA but no acute stroke, MRI negative. Per neurology/CCM concern for seizures, but EEG negative. Extubated 4/11. Per CCM seizure with Todd's paralysis.  PMH includes: CVA, medication non compliance, HTN.   Clinical Impression  Pt in bed upon arrival of PT, agreeable to evaluation at this time. Prior to admission the pt was independent with mobility, attending the gym most mornings, and safe home alone while his wife was at work. The pt now presents with deficits in attention, strength, balance, and activity tolerance, but full assessment limited by lethargy this morning. The pt did follow commands with all extremities but was not fully participatory in MMT or coordination testing due to lethargy. Limited to bed mobility and sitting balance, and pt dependent on totalA to complete at this time. Given prior level of independence and good family support, recommend continued intensive therapies to facilitate return to full independence. Will continue to follow acutely and re-evaluate as needed as alertness improves.      If plan is discharge home, recommend the following: Two people to help with walking and/or transfers;Two people to help with bathing/dressing/bathroom;Assistance with cooking/housework;Assistance with feeding;Direct supervision/assist for medications management;Direct supervision/assist for financial management;Assist for transportation;Help with stairs or ramp for entrance;Supervision due to cognitive status   Can travel by private vehicle        Equipment Recommendations Wheelchair (measurements PT);Wheelchair cushion (measurements PT)  Recommendations for Other Services   Rehab consult    Functional Status Assessment Patient has had a recent decline in their functional status and demonstrates the ability to make significant improvements in function in a reasonable and predictable amount of time.     Precautions / Restrictions Precautions Precautions: Fall Recall of Precautions/Restrictions: Impaired Precaution/Restrictions Comments: restraints Restrictions Weight Bearing Restrictions Per Provider Order: No      Mobility  Bed Mobility Overal bed mobility: Needs Assistance Bed Mobility: Rolling, Sidelying to Sit, Sit to Sidelying Rolling: Total assist, +2 for physical assistance Sidelying to sit: Total assist, +2 for physical assistance     Sit to sidelying: Total assist, +2 for physical assistance General bed mobility comments: pt needing totalA to complete movement of LE to EOB, trunk elevation, and to maintain sitting EOB. pt did initiate lean to reutrn to bed, no assist with rolling in bed    Transfers                   General transfer comment: deferred due to lethargy      Balance Overall balance assessment: Needs assistance Sitting-balance support: No upper extremity supported, Feet supported Sitting balance-Leahy Scale: Poor Sitting balance - Comments: posterior bias, total assist to maintain upright Postural control: Posterior lean                                   Pertinent Vitals/Pain Pain Assessment Pain Assessment: No/denies pain Faces Pain Scale: No hurt Pain Intervention(s): Limited activity within patient's tolerance, Monitored during session    Home Living Family/patient expects to be discharged to:: Private residence Living Arrangements: Spouse/significant other;Children Available Help at Discharge: Family Type of Home: Mobile home Home Access: Stairs to enter Entrance Stairs-Rails:  None Entrance Stairs-Number of Steps: 4   Home Layout: One level Home Equipment: Shower seat Additional  Comments: spouse working, does not have 24/7    Prior Function Prior Level of Function : Needs assist             Mobility Comments: indepedent, goes to the gym ADLs Comments: pt needing some assist for bathing/dressng due to baseline limited functional use L hand     Extremity/Trunk Assessment   Upper Extremity Assessment Upper Extremity Assessment: Defer to OT evaluation RUE Deficits / Details: grossly weak, but able raise arm up and squeeze hand, show thumbs up and 2 fingers. RUE Coordination: decreased gross motor;decreased fine motor LUE Deficits / Details: hx of deficits in hand (per spouse) from prior CVA.   Hypertonicity in UE.  Spouse reports he has a splint for his hand/wrist he wears at night. LUE Sensation: decreased light touch;decreased proprioception LUE Coordination: decreased fine motor;decreased gross motor    Lower Extremity Assessment Lower Extremity Assessment: Difficult to assess due to impaired cognition;RLE deficits/detail;LLE deficits/detail RLE Deficits / Details: pt with better command following but not holding against MMT. able to move against gravity, limited assessment due to poor command following and alertness LLE Deficits / Details: pt with partial ROM against gravity, full passive ROM at ankle and knee. limited assessment due to poor command following       Communication   Communication Communication: Impaired    Cognition Arousal: Obtunded, Suspect due to medications Behavior During Therapy: Flat affect                             Following commands: Impaired Following commands impaired: Follows one step commands inconsistently, Follows one step commands with increased time     Cueing Cueing Techniques: Verbal cues, Tactile cues     General Comments General comments (skin integrity, edema, etc.): VSS on RA    Exercises     Assessment/Plan    PT Assessment Patient needs continued PT services  PT Problem List  Decreased strength;Decreased range of motion;Decreased activity tolerance;Decreased balance;Decreased mobility;Decreased safety awareness;Decreased knowledge of precautions       PT Treatment Interventions DME instruction;Gait training;Stair training;Functional mobility training;Therapeutic activities;Therapeutic exercise;Balance training;Neuromuscular re-education;Patient/family education    PT Goals (Current goals can be found in the Care Plan section)  Acute Rehab PT Goals Patient Stated Goal: none stated, per wife to return home PT Goal Formulation: With patient/family Time For Goal Achievement: 01/31/24 Potential to Achieve Goals: Good    Frequency Min 3X/week     Co-evaluation PT/OT/SLP Co-Evaluation/Treatment: Yes Reason for Co-Treatment: Complexity of the patient's impairments (multi-system involvement);Necessary to address cognition/behavior during functional activity;For patient/therapist safety;To address functional/ADL transfers PT goals addressed during session: Balance;Strengthening/ROM;Mobility/safety with mobility OT goals addressed during session: ADL's and self-care       AM-PAC PT "6 Clicks" Mobility  Outcome Measure Help needed turning from your back to your side while in a flat bed without using bedrails?: Total Help needed moving from lying on your back to sitting on the side of a flat bed without using bedrails?: Total Help needed moving to and from a bed to a chair (including a wheelchair)?: Total Help needed standing up from a chair using your arms (e.g., wheelchair or bedside chair)?: Total Help needed to walk in hospital room?: Total Help needed climbing 3-5 steps with a railing? : Total 6 Click Score: 6    End of Session  Activity Tolerance: Patient limited by lethargy Patient left: in bed;with call bell/phone within reach;with SCD's reapplied;with restraints reapplied;with bed alarm set;with family/visitor present Nurse Communication: Mobility  status (SpO2 stable on RA) PT Visit Diagnosis: Unsteadiness on feet (R26.81);Muscle weakness (generalized) (M62.81);Other abnormalities of gait and mobility (R26.89)    Time: 9562-1308 PT Time Calculation (min) (ACUTE ONLY): 30 min   Charges:   PT Evaluation $PT Eval Moderate Complexity: 1 Mod   PT General Charges $$ ACUTE PT VISIT: 1 Visit         Barnabas Booth, PT, DPT   Acute Rehabilitation Department Office [phone] Secure Chat Communication Preferred  Lona Rist 01/17/2024, 11:53 AM

## 2024-01-17 NOTE — Progress Notes (Signed)
 Patient refusing lab draw per phlebotomist.  Communicated with CCM MD.

## 2024-01-17 NOTE — Evaluation (Signed)
 Occupational Therapy Evaluation Patient Details Name: Leonard [name] MRN: [id number] DOB: [birth year] Today's Date: 01/17/2024   History of Present Illness   Pt is a 68 y/o male presenting on 4/10 with AMS, L sided gaze, R sided weakness and global aphasia. Post intubation CT with encephalomalacia in R hemisphere from old CVA but no acute stroke, MRI negative. Per neurology/CCM concern for seizures, but EEG negative. Extubated 4/11. Per CCM seizure with Todd's paralysis.  PMH includes: CVA, medication non compliance, HTN.     Clinical Impressions PTA patient independent with mobility, needing some assist with ADLs from spouse due to decreased functional use of L hand.  Admitted for above and presents with problem list below.  Pt requires total assist +2 for bed mobility, total assist for ADLs and sitting EOB.  VSS on RA during session.  Hypertonicity in L UE, spouse reports he typically wears a splint to L hand at night but has good movement of UE typically. He follows some simple commands but obtunded during session requiring frequent cueing and increased time to respond. Keeps eyes closed during session.  Based on performance today, believe pt will best benefit from continued OT services acutely and after dc at an inpatient setting with >3hrs/day to optimize independence, safety and return to PLOF with ADLs and mobility.      If plan is discharge home, recommend the following:   Two people to help with walking and/or transfers;Two people to help with bathing/dressing/bathroom;Supervision due to cognitive status;Direct supervision/assist for medications management;Direct supervision/assist for financial management     Functional Status Assessment   Patient has had a recent decline in their functional status and demonstrates the ability to make significant improvements in function in a reasonable and predictable amount of time.     Equipment Recommendations   Other (comment) (defer)      Recommendations for Other Services   Rehab consult;Speech consult     Precautions/Restrictions   Precautions Precautions: Fall Recall of Precautions/Restrictions: Impaired Precaution/Restrictions Comments: restraints Restrictions Weight Bearing Restrictions Per Provider Order: No     Mobility Bed Mobility Overal bed mobility: Needs Assistance Bed Mobility: Rolling, Sidelying to Sit, Sit to Sidelying Rolling: Total assist, +2 for physical assistance Sidelying to sit: Total assist, +2 for physical assistance     Sit to sidelying: Total assist, +2 for physical assistance      Transfers                   General transfer comment: deferred      Balance Overall balance assessment: Needs assistance Sitting-balance support: No upper extremity supported, Feet supported Sitting balance-Leahy Scale: Poor Sitting balance - Comments: posterior bias, total assist to maintain upright                                   ADL either performed or assessed with clinical judgement   ADL Overall ADL's : Needs assistance/impaired                                     Functional mobility during ADLs: Total assistance;+2 for physical assistance General ADL Comments: total care     Vision   Vision Assessment?: Wears glasses for reading Additional Comments: cueing to open eyes and only maintains briefly. needs further assessment     Perception  Praxis         Pertinent Vitals/Pain Pain Assessment Pain Assessment: Faces Faces Pain Scale: No hurt Pain Intervention(s): Monitored during session, Repositioned     Extremity/Trunk Assessment Upper Extremity Assessment Upper Extremity Assessment: Right hand dominant;LUE deficits/detail;RUE deficits/detail RUE Deficits / Details: grossly weak, but able raise arm up and squeeze hand, show thumbs up and 2 fingers. RUE Coordination: decreased gross motor;decreased fine motor LUE Deficits  / Details: hx of deficits in hand (per spouse) from prior CVA.   Hypertonicity in UE.  Spouse reports he has a splint for his hand/wrist he wears at night. LUE Sensation: decreased light touch;decreased proprioception LUE Coordination: decreased fine motor;decreased gross motor   Lower Extremity Assessment Lower Extremity Assessment: Defer to PT evaluation       Communication Communication Communication: Impaired   Cognition Arousal: Obtunded, Suspect due to medications Behavior During Therapy: Flat affect Cognition: Difficult to assess Difficult to assess due to: Level of arousal           OT - Cognition Comments: patient able to voice birthday, but reports he is at home.  follows some simple 1 step commands with increased time.  he requires constant stimulation and has a hard time maintaining eyes open                 Following commands: Impaired Following commands impaired: Follows one step commands inconsistently, Follows one step commands with increased time     Cueing  General Comments   Cueing Techniques: Verbal cues;Tactile cues  VSS on RA   Exercises     Shoulder Instructions      Home Living Family/patient expects to be discharged to:: Private residence Living Arrangements: Spouse/significant other;Children Available Help at Discharge: Family Type of Home: Mobile home Home Access: Stairs to enter Secretary/administrator of Steps: 4 Entrance Stairs-Rails: None Home Layout: One level     Bathroom Shower/Tub: Chief Strategy Officer: Handicapped height     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 Comments: spouse working, does not have 24/7      Prior Functioning/Environment Prior Level of Function : Needs assist             Mobility Comments: indepedent, goes to the gym ADLs Comments: pt needing some assist for bathing/dressng due to baseline limited functional use L hand    OT Problem List: Decreased strength;Decreased  activity tolerance;Decreased range of motion;Impaired UE functional use;Impaired tone;Impaired sensation;Decreased knowledge of precautions;Decreased knowledge of use of DME or AE;Decreased cognition;Decreased safety awareness;Decreased coordination;Impaired vision/perception;Impaired balance (sitting and/or standing)   OT Treatment/Interventions: Self-care/ADL training;Therapeutic exercise;DME and/or AE instruction;Therapeutic activities;Balance training;Patient/family education;Cognitive remediation/compensation;Visual/perceptual remediation/compensation;Neuromuscular education;Splinting      OT Goals(Current goals can be found in the care plan section)   Acute Rehab OT Goals Patient Stated Goal: none stated OT Goal Formulation: Patient unable to participate in goal setting Time For Goal Achievement: 01/31/24 Potential to Achieve Goals: Fair   OT Frequency:  Min 2X/week    Co-evaluation PT/OT/SLP Co-Evaluation/Treatment: Yes Reason for Co-Treatment: Complexity of the patient's impairments (multi-system involvement);Necessary to address cognition/behavior during functional activity;For patient/therapist safety;To address functional/ADL transfers   OT goals addressed during session: ADL's and self-care      AM-PAC OT "6 Clicks" Daily Activity     Outcome Measure Help from another person eating meals?: Total Help from another person taking care of personal grooming?: Total Help from another person toileting, which includes using toliet, bedpan, or urinal?: Total Help from another person  bathing (including washing, rinsing, drying)?: Total Help from another person to put on and taking off regular upper body clothing?: Total Help from another person to put on and taking off regular lower body clothing?: Total 6 Click Score: 6   End of Session Nurse Communication: Mobility status;Precautions  Activity Tolerance: Patient limited by lethargy Patient left: in bed;with call bell/phone  within reach;with bed alarm set;with restraints reapplied;with family/visitor present;with SCD's reapplied  OT Visit Diagnosis: Other abnormalities of gait and mobility (R26.89);Muscle weakness (generalized) (M62.81);Other symptoms and signs involving cognitive function;Cognitive communication deficit (R41.841)                Time: 1610-9604 OT Time Calculation (min): 31 min Charges:  OT General Charges $OT Visit: 1 Visit OT Evaluation $OT Eval Moderate Complexity: 1 Mod  Bary Boss, OT Acute Rehabilitation Services Office [phone] Secure Chat Preferred    Fredrich Jefferson 01/17/2024, 10:57 AM

## 2024-01-17 NOTE — Progress Notes (Signed)
 NEUROLOGY CONSULT FOLLOW UP NOTE   Date of service: January 17, 2024 Patient Name: Leonard [name] MRN:  [id number] DOB:  10-14-1955  Interval Hx/subjective   Patient is significantly improved.  He had some agitation overnight was started on Precedex.  Vitals   Vitals:   01/17/24 0800 01/17/24 0900 01/17/24 1000 01/17/24 1100  BP: 135/62 (!) 150/82 (!) 181/92 137/66  Pulse: 67 98 92 77  Resp: 20 16    Temp: (!) 96.8 F (36 C) (!) 97.2 F (36.2 C) 97.9 F (36.6 C) 98.8 F (37.1 C)  TempSrc:      SpO2: 98% 97% 100% 96%  Weight:      Height:         Body mass index is 25.02 kg/m.  Physical Exam   Extubated  Neurologic Examination    MS: He is awake, alert, able to tell me the year, but not month. CN: Visual fields are full, he is able to cross midline to the left, he has some left facial weakness Motor: He has a left hemiparesis, he is able to squeeze his left hand some, but 2/5 weakness of the left upper extremity, 4/5 in the left lower extremity, moves the right side well Sensory: He endorses decreased sensation on the left compared to the right  Medications  Current Facility-Administered Medications:    acetaminophen (TYLENOL) tablet 650 mg, 650 mg, Per Tube, Q4H PRN, Bowser, Grace E, NP   amLODipine (NORVASC) tablet 5 mg, 5 mg, Per Tube, Daily, Bowser, Grace E, NP, 5 mg at 01/17/24 0920   Chlorhexidine Gluconate Cloth 2 % PADS 6 each, 6 each, Topical, Daily, Josiah Nigh, MD, 6 each at 01/16/24 1513   dexmedetomidine (PRECEDEX) 400 MCG/100ML (4 mcg/mL) infusion, 0-0.8 mcg/kg/hr, Intravenous, Titrated, Ogan, Okoronkwo U, MD, Stopped at 01/17/24 0821   docusate (COLACE) 50 MG/5ML liquid 100 mg, 100 mg, Per Tube, BID PRN, Bowser, Darin Edinger, NP   feeding supplement (JEVITY 1.5 CAL/FIBER) liquid 1,000 mL, 1,000 mL, Per Tube, Continuous, Agarwala, Ravi, MD, Last Rate: 55 mL/hr at 01/17/24 1100, Infusion Verify at 01/17/24 1100   feeding supplement (PROSource TF20) liquid 60  mL, 60 mL, Per Tube, Daily, Agarwala, Ravi, MD, 60 mL at 01/17/24 0920   heparin injection 5,000 Units, 5,000 Units, Subcutaneous, Q8H, Bowser, Grace E, NP, 5,000 Units at 01/17/24 0518   hydrALAZINE (APRESOLINE) injection 5-10 mg, 5-10 mg, Intravenous, Q4H PRN, Ogan, Okoronkwo U, MD, 10 mg at 01/16/24 2144   insulin aspart (novoLOG) injection 0-15 Units, 0-15 Units, Subcutaneous, Q4H, Bowser, Grace E, NP, 2 Units at 01/17/24 0401   labetalol (NORMODYNE) injection 5-10 mg, 5-10 mg, Intravenous, Q2H PRN, Bowser, Grace E, NP, 10 mg at 01/16/24 2028   lactated ringers infusion, , Intravenous, Continuous, Bowser, Grace E, NP, Last Rate: 50 mL/hr at 01/17/24 1100, Infusion Verify at 01/17/24 1100   levETIRAcetam (KEPPRA) IVPB 500 mg/100 mL premix, 500 mg, Intravenous, BID, Dela Favor, Sim Dryer, NP, Stopped at 01/17/24 0938   ondansetron (ZOFRAN) injection 4 mg, 4 mg, Intravenous, Q6H PRN, Bowser, Grace E, NP   Oral care mouth rinse, 15 mL, Mouth Rinse, 4 times per day, Arlina Lair, MD, 15 mL at 01/17/24 0923   Oral care mouth rinse, 15 mL, Mouth Rinse, PRN, Agarwala, Ravi, MD   pantoprazole (PROTONIX) injection 40 mg, 40 mg, Intravenous, QHS, Bowser, Grace E, NP, 40 mg at 01/16/24 2241   polyethylene glycol (MIRALAX / GLYCOLAX) packet 17 g, 17 g, Per Tube, Daily PRN, Bowser,  Darin Edinger, NP  Labs and Diagnostic Imaging   Results for orders placed or performed during the hospital encounter of 01/15/24 (from the past 24 hours)  Glucose, capillary     Status: Abnormal   Collection Time: 01/16/24  3:19 PM  Result Value Ref Range   Glucose-Capillary 106 (H) 70 - 99 mg/dL  Glucose, capillary     Status: Abnormal   Collection Time: 01/16/24  7:29 PM  Result Value Ref Range   Glucose-Capillary 104 (H) 70 - 99 mg/dL  Basic metabolic panel     Status: Abnormal   Collection Time: 01/16/24 10:37 PM  Result Value Ref Range   Sodium 141 135 - 145 mmol/L   Potassium 3.7 3.5 - 5.1 mmol/L   Chloride 111 98 - 111  mmol/L   CO2 19 (L) 22 - 32 mmol/L   Glucose, Bld 120 (H) 70 - 99 mg/dL   BUN 9 8 - 23 mg/dL   Creatinine, Ser 4.09 0.61 - 1.24 mg/dL   Calcium 8.7 (L) 8.9 - 10.3 mg/dL   GFR, Estimated >81 >19 mL/min   Anion gap 11 5 - 15  Osmolality, urine     Status: None   Collection Time: 01/16/24 10:47 PM  Result Value Ref Range   Osmolality, Ur 430 300 - 900 mOsm/kg  Glucose, capillary     Status: Abnormal   Collection Time: 01/16/24 11:07 PM  Result Value Ref Range   Glucose-Capillary 138 (H) 70 - 99 mg/dL  Basic metabolic panel     Status: Abnormal   Collection Time: 01/17/24  2:43 AM  Result Value Ref Range   Sodium 140 135 - 145 mmol/L   Potassium 4.2 3.5 - 5.1 mmol/L   Chloride 108 98 - 111 mmol/L   CO2 21 (L) 22 - 32 mmol/L   Glucose, Bld 114 (H) 70 - 99 mg/dL   BUN 10 8 - 23 mg/dL   Creatinine, Ser 1.47 0.61 - 1.24 mg/dL   Calcium 8.7 (L) 8.9 - 10.3 mg/dL   GFR, Estimated >82 >95 mL/min   Anion gap 11 5 - 15  Glucose, capillary     Status: Abnormal   Collection Time: 01/17/24  3:21 AM  Result Value Ref Range   Glucose-Capillary 126 (H) 70 - 99 mg/dL  Basic metabolic panel     Status: Abnormal   Collection Time: 01/17/24  7:49 AM  Result Value Ref Range   Sodium 139 135 - 145 mmol/L   Potassium 4.2 3.5 - 5.1 mmol/L   Chloride 107 98 - 111 mmol/L   CO2 18 (L) 22 - 32 mmol/L   Glucose, Bld 109 (H) 70 - 99 mg/dL   BUN 11 8 - 23 mg/dL   Creatinine, Ser 6.21 0.61 - 1.24 mg/dL   Calcium 8.7 (L) 8.9 - 10.3 mg/dL   GFR, Estimated >30 >86 mL/min   Anion gap 14 5 - 15  Glucose, capillary     Status: Abnormal   Collection Time: 01/17/24  8:05 AM  Result Value Ref Range   Glucose-Capillary 119 (H) 70 - 99 mg/dL  Glucose, capillary     Status: Abnormal   Collection Time: 01/17/24 12:11 PM  Result Value Ref Range   Glucose-Capillary 102 (H) 70 - 99 mg/dL     Assessment   Leonard [name] is a 68 y.o. male who presented with refractory status epilepticus requiring intubation.  He  has not had any further seizures since EEG has been connected, and  I agree with weaning sedation.  He is noncompliant with all of his medications, and we will need to stress need for compliance, especially with antiepileptics moving forward.  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  Given his agitation, we could change Keppra to Depakote.  Recommendations  Discontinue Keppra, Depakote 1500 mg x 1 followed by 500 mg twice daily Neurology will follow ______________________________________________________________________   Signed, Ann Keto, MD [facility]

## 2024-01-17 NOTE — Progress Notes (Signed)
 eLink Physician-Brief Progress Note Patient Name: Leonard [name] DOB: Jul 03, 1956 MRN: [id number]   Date of Service  01/17/2024  HPI/Events of Note  Bilateral soft ankle restraints and posey belt orders were entered in the context of his severe agitated delirium with assaultive tendencies.  eICU Interventions          Marlynn Singer 01/17/2024, 3:04 AM

## 2024-01-17 NOTE — Progress Notes (Signed)
 Inpatient Rehab Admissions Coordinator:   Per therapy recommendation, patient was screened for CIR candidacy by Megan Salon, MS, CCC-SLP. At this time, Pt. is not at a level to tolerate the intensity of CIR; however,   Pt. may have potential to progress to becoming a potential CIR candidate, so CIR admissions team will follow and monitor for progress and participation with therapies and place consult order if Pt. appears to be an appropriate candidate. Please contact me with any questions.   Megan Salon, MS, CCC-SLP Rehab Admissions Coordinator  [phone] (celll) [phone] (office)

## 2024-01-17 NOTE — Progress Notes (Signed)
 NAME:  Leonard [name], MRN:  [id number], DOB:  1956/05/01, LOS: 2 ADMISSION DATE:  01/15/2024, CONSULTATION DATE:  01/15/24 REFERRING MD:  Aldean Amass, CHIEF COMPLAINT:  AMS   History of Present Illness:  68 yo M PMH HTN, medication non-adherence, DNR status (pre-arrest interventions desired), GERD, prior stroke w residual R sided wknss, who presented to ED 4/10 w R sided weakness, L gaze preference and aphasia. Brought in as code stroke. LKW 4/10 0800. He was combative in ED and required intubation to facilitate scans. Additionally was hypertensive SBP > 200 requiring cleviprex.  CT H, CTA head/neck did not reveal ICH/acute ischemic stroke / LVO.  Clinical suspicion growing for seizure. Keppra ordered, cEEG ordered, MRI brain ordered & PCCM asked to admit.    In ED d/w wife who shares that pt does not take his home medications (statin, norvasc). Also shares that he has a will which specifies DNR. All pre-arrest interventions ok at present.  Pertinent  Medical History  CVA HTN GERD  Significant Hospital Events: Including procedures, antibiotic start and stop dates in addition to other pertinent events   4/10 AMS weakness gaze deviation. Intubated in ED. No stroke. Admit to PCCM c/f seizures   Interim History / Subjective:  Successfully extubated yesterday. Remained confused overnight and combative requiring Precedex. Now off but still somnolent.   Objective   Blood pressure 139/71, pulse 89, temperature 100.2 F (37.9 C), resp. rate 16, height 5\' 10"  (1.778 m), weight 79.1 kg, SpO2 93%.        Intake/Output Summary (Last 24 hours) at 01/17/2024 1514 Last data filed at 01/17/2024 1300 Gross per 24 hour  Intake 2489.42 ml  Output 2405 ml  Net 84.42 ml   Filed Weights   01/15/24 1848 01/16/24 0500 01/17/24 0500  Weight: 79.8 kg 82.6 kg 79.1 kg    Examination: General: Somnolent in no distress.  Neuro: sleepy but will answer questions appropriately.  No focal deficits.  HENT: Cortrak  in place.  Lungs: clear bilaterally.  Cardiovascular: HS normal. Hypertensive.  Abdomen: soft.  Extremities: no edema.  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  - Still somewhat encephalopathic. Avoid sedative. Monitor for sundowning. Mentation Ebanks improve with better BP control.  - Neurology has changed Keppra to VPA.   Best Practice (right click and "Reselect all SmartList Selections" daily)   Diet/type: NPO - tube feeds DVT prophylaxis prophylactic heparin  Pressure ulcer(s): pressure ulcer assessment deferred  GI prophylaxis: PPI Lines: N/A Foley:  Yes, and it is still needed Code Status:  DNR Last date of multidisciplinary goals of care discussion [4/11]  CRITICAL CARE Performed by: Arlina Lair   Total critical care time: 35 minutes  Critical care time was exclusive of separately billable procedures and treating other patients.  Critical care was necessary to treat or prevent imminent or life-threatening deterioration.  Critical care was time spent personally by me on the following activities: development of treatment plan with patient and/or surrogate as well as nursing, discussions with consultants, evaluation of patient's response to treatment, examination of patient, obtaining history from patient or surrogate, ordering and performing treatments and interventions, ordering and review of laboratory studies, ordering and review of radiographic studies, pulse oximetry, re-evaluation of patient's condition and  participation in multidisciplinary rounds.  Arlina Lair, MD [facility] ICU Physician [facility]  Pager: [phone] Mobile: [phone] After hours:  [phone].   01/17/2024, 3:14 PM

## 2024-01-18 DIAGNOSIS — R451 Restlessness and agitation: Secondary | ICD-10-CM | POA: Diagnosis not present

## 2024-01-18 DIAGNOSIS — I672 Cerebral atherosclerosis: Secondary | ICD-10-CM

## 2024-01-18 DIAGNOSIS — G40901 Epilepsy, unspecified, not intractable, with status epilepticus: Secondary | ICD-10-CM | POA: Diagnosis not present

## 2024-01-18 DIAGNOSIS — Z95828 Presence of other vascular implants and grafts: Secondary | ICD-10-CM

## 2024-01-18 DIAGNOSIS — I69398 Other sequelae of cerebral infarction: Secondary | ICD-10-CM | POA: Diagnosis not present

## 2024-01-18 LAB — GLUCOSE, CAPILLARY: Glucose-Capillary: 155 mg/dL — ABNORMAL HIGH (ref 70–99)

## 2024-01-18 MED ORDER — DOCUSATE SODIUM 100 MG PO CAPS: 100.0000 mg | ORAL_CAPSULE | Freq: Two times a day (BID) | ORAL | Status: DC | PRN

## 2024-01-18 MED ORDER — HYDROCHLOROTHIAZIDE 12.5 MG PO TABS
12.5000 mg | ORAL_TABLET | Freq: Every day | ORAL | Status: DC
  Administered 2024-01-19 – 2024-01-20 (×2): 12.5 mg via ORAL
  Filled 2024-01-18 (×2): qty 1

## 2024-01-18 MED ORDER — ACETAMINOPHEN 325 MG PO TABS: 650.0000 mg | ORAL_TABLET | ORAL | Status: DC | PRN

## 2024-01-18 MED ORDER — ASPIRIN 81 MG PO TBEC
81.0000 mg | DELAYED_RELEASE_TABLET | Freq: Every day | ORAL | Status: DC
  Administered 2024-01-19 – 2024-01-20 (×2): 81 mg via ORAL
  Filled 2024-01-18 (×4): qty 1

## 2024-01-18 MED ORDER — PANTOPRAZOLE SODIUM 40 MG PO TBEC
40.0000 mg | DELAYED_RELEASE_TABLET | Freq: Every day | ORAL | Status: DC
  Administered 2024-01-18 – 2024-01-19 (×2): 40 mg via ORAL
  Filled 2024-01-18 (×2): qty 1

## 2024-01-18 MED ORDER — ATORVASTATIN CALCIUM 40 MG PO TABS
40.0000 mg | ORAL_TABLET | Freq: Every day | ORAL | Status: DC
  Administered 2024-01-18 – 2024-01-20 (×3): 40 mg via ORAL
  Filled 2024-01-18 (×4): qty 1

## 2024-01-18 MED ORDER — LISINOPRIL 20 MG PO TABS
20.0000 mg | ORAL_TABLET | Freq: Every day | ORAL | Status: DC
  Administered 2024-01-19 – 2024-01-20 (×2): 20 mg via ORAL
  Filled 2024-01-18 (×2): qty 1

## 2024-01-18 MED ORDER — AMLODIPINE BESYLATE 5 MG PO TABS
5.0000 mg | ORAL_TABLET | Freq: Every day | ORAL | Status: DC
  Administered 2024-01-19 – 2024-01-20 (×2): 5 mg via ORAL
  Filled 2024-01-18 (×2): qty 1

## 2024-01-18 MED ORDER — POLYETHYLENE GLYCOL 3350 17 G PO PACK: 17.0000 g | PACK | Freq: Every day | ORAL | Status: DC | PRN

## 2024-01-18 NOTE — Progress Notes (Signed)
 NEUROLOGY CONSULT FOLLOW UP NOTE   Date of service: January 18, 2024 Patient Name: Leonard [name] MRN:  [id number] DOB:  09/30/1956  Interval Hx/subjective   I have seen the patient and reviewed the above note.  Vitals   Vitals:   01/18/24 0709 01/18/24 0800 01/18/24 0900 01/18/24 1000  BP:  (!) 147/75    Pulse:  81 (!) 108 100  Resp:   (!) 28   Temp:  99.3 F (37.4 C)    TempSrc:      SpO2:  94% 97% 96%  Weight: 82.2 kg     Height:         Body mass index is 26 kg/m.  Physical Exam   Extubated  Neurologic Examination    MS: He is awake, alert, he is oriented to month, year CN: Visual fields are full, he is able to cross midline to the left, he has some left facial weakness Motor: He has a left hemiparesis that is slightly better than yesterday, he is able to lift the left arm, but with continued severe weakness in the left hand,, 4/5 in the left lower extremity, moves the right side well Sensory: He endorses decreased sensation on the left compared to the right  Medications  Current Facility-Administered Medications:    acetaminophen (TYLENOL) tablet 650 mg, 650 mg, Oral, Q4H PRN, Millen, Jessica B, RPH   [START ON 01/19/2024] amLODipine (NORVASC) tablet 5 mg, 5 mg, Oral, Daily, Millen, Jessica B, RPH   aspirin EC tablet 81 mg, 81 mg, Oral, Daily, Geovanna Simko, Althea Atkinson, MD   atorvastatin (LIPITOR) tablet 40 mg, 40 mg, Oral, Daily, Griffyn Kucinski, Althea Atkinson, MD   Chlorhexidine Gluconate Cloth 2 % PADS 6 each, 6 each, Topical, Daily, Josiah Nigh, MD, 6 each at 01/18/24 1024   divalproex (DEPAKOTE) DR tablet 500 mg, 500 mg, Oral, Q12H, Augustin Leber, MD, 500 mg at 01/18/24 1023   docusate sodium (COLACE) capsule 100 mg, 100 mg, Oral, BID PRN, Millen, Jessica B, RPH   feeding supplement (JEVITY 1.5 CAL/FIBER) liquid 1,000 mL, 1,000 mL, Per Tube, Continuous, Agarwala, Martha Slack, MD, Last Rate: 55 mL/hr at 01/18/24 0800, Infusion Verify at 01/18/24 0800   feeding  supplement (PROSource TF20) liquid 60 mL, 60 mL, Per Tube, Daily, Agarwala, Ravi, MD, 60 mL at 01/18/24 1025   heparin injection 5,000 Units, 5,000 Units, Subcutaneous, Q8H, Bowser, Darin Edinger, NP, 5,000 Units at 01/18/24 0731   hydrALAZINE (APRESOLINE) injection 5-10 mg, 5-10 mg, Intravenous, Q4H PRN, Ogan, Okoronkwo U, MD, 10 mg at 01/17/24 1521   [START ON 01/19/2024] hydrochlorothiazide (HYDRODIURIL) tablet 12.5 mg, 12.5 mg, Oral, Daily, Millen, Jessica B, RPH   labetalol (NORMODYNE) injection 5-10 mg, 5-10 mg, Intravenous, Q2H PRN, Bowser, Grace E, NP, 10 mg at 01/17/24 1505   [START ON 01/19/2024] lisinopril (ZESTRIL) tablet 20 mg, 20 mg, Oral, Daily, Millen, Jessica B, RPH   ondansetron (ZOFRAN) injection 4 mg, 4 mg, Intravenous, Q6H PRN, Bowser, Grace E, NP   Oral care mouth rinse, 15 mL, Mouth Rinse, 4 times per day, Arlina Lair, MD, 15 mL at 01/18/24 7829   Oral care mouth rinse, 15 mL, Mouth Rinse, PRN, Agarwala, Ravi, MD   pantoprazole (PROTONIX) EC tablet 40 mg, 40 mg, Oral, QHS, Millen, Jessica B, RPH   polyethylene glycol (MIRALAX / GLYCOLAX) packet 17 g, 17 g, Oral, Daily PRN, Millen, Jessica B, RPH  Labs and Diagnostic Imaging   Results for orders placed or performed during the hospital encounter  of 01/15/24 (from the past 24 hours)  Glucose, capillary     Status: Abnormal   Collection Time: 01/17/24 12:11 PM  Result Value Ref Range   Glucose-Capillary 102 (H) 70 - 99 mg/dL  Glucose, capillary     Status: Abnormal   Collection Time: 01/17/24  3:54 PM  Result Value Ref Range   Glucose-Capillary 118 (H) 70 - 99 mg/dL  Basic metabolic panel     Status: Abnormal   Collection Time: 01/17/24  8:13 PM  Result Value Ref Range   Sodium 141 135 - 145 mmol/L   Potassium 3.5 3.5 - 5.1 mmol/L   Chloride 108 98 - 111 mmol/L   CO2 19 (L) 22 - 32 mmol/L   Glucose, Bld 153 (H) 70 - 99 mg/dL   BUN 12 8 - 23 mg/dL   Creatinine, Ser 0.86 0.61 - 1.24 mg/dL   Calcium 8.8 (L) 8.9 - 10.3  mg/dL   GFR, Estimated >57 >84 mL/min   Anion gap 14 5 - 15     Assessment   Leonard [name] is a 68 y.o. male who presented with refractory status epilepticus requiring intubation.  He has not had any further seizures since EEG has been connected, and I agree with weaning sedation.  He is noncompliant with all of his medications, and we will need to stress need for compliance, especially with antiepileptics moving forward.  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  He was initially started on Keppra, but due to agitation he was changed to Depakote.  Can discontinue Keppra today.  He has very extensive intracranial atherosclerosis as well as a right carotid stent, and despite him not taking any of his medications at home, I would favor restarting aspirin and statin.  Recommendations  Continue Depakote 500 mg twice daily Restart aspirin 81 mg and atorvastatin 40 mg daily Discontinue Keppra Neurology will be available on an as-needed basis, please call if further questions or concerns. ______________________________________________________________________   Flint Hummer, MD [facility]

## 2024-01-18 NOTE — Plan of Care (Signed)
 progressing

## 2024-01-18 NOTE — Progress Notes (Signed)
   NAME:  Leonard [name], MRN:  [id number], DOB:  August 02, 1956, LOS: 3 ADMISSION DATE:  01/15/2024, CONSULTATION DATE:  01/15/24 REFERRING MD:  Aldean Amass, CHIEF COMPLAINT:  AMS   History of Present Illness:  68 yo M PMH HTN, medication non-adherence, DNR status (pre-arrest interventions desired), GERD, prior stroke w residual R sided wknss, who presented to ED 4/10 w R sided weakness, L gaze preference and aphasia. Brought in as code stroke. LKW 4/10 0800. He was combative in ED and required intubation to facilitate scans. Additionally was hypertensive SBP > 200 requiring cleviprex.  CT H, CTA head/neck did not reveal ICH/acute ischemic stroke / LVO.  Clinical suspicion growing for seizure. Keppra ordered, cEEG ordered, MRI brain ordered & PCCM asked to admit.    In ED d/w wife who shares that pt does not take his home medications (statin, norvasc). Also shares that he has a will which specifies DNR. All pre-arrest interventions ok at present.  Pertinent  Medical History  CVA HTN GERD  Significant Hospital Events: Including procedures, antibiotic start and stop dates in addition to other pertinent events   4/10 AMS weakness gaze deviation. Intubated in ED. No stroke. Admit to PCCM c/f seizures  4/11 extubated  Interim History / Subjective:  Somnolence has improved. Family thinks he is close to his baseline.   Objective   Blood pressure (!) 146/78, pulse 97, temperature 98.6 F (37 C), resp. rate (!) 28, height 5\' 10"  (1.778 m), weight 82.2 kg, SpO2 100%.        Intake/Output Summary (Last 24 hours) at 01/18/2024 1321 Last data filed at 01/18/2024 0913 Gross per 24 hour  Intake 2040.83 ml  Output 700 ml  Net 1340.83 ml   Filed Weights   01/16/24 0500 01/17/24 0500 01/18/24 0709  Weight: 82.6 kg 79.1 kg 82.2 kg    Examination: General: Sitting in chair comfortably Neuro: Awake and answering questions appropriately if slowly.  L sided weakness unchanged from baseline.  HENT: Cortrak  in place.  Lungs: clear bilaterally.  Cardiovascular: HS normal. Hypertension has improved.  Abdomen: soft.  Extremities: no edema.  GU: foley   Ancillary tests:  Na 141. 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  - Restart ASA and statin. - Mental status appears be nearly back to baseline. Suspect that he Bonds still be a little encephalopathic from hypertensive crisis. - Continue Depakote for seizure prevention. Seizure felt to be due to combination of hypertensive encephalopathy and scarring from prior right MCA stroke.  - Spoke to patient with his family present regarding the importance of compliance with medication.   Best Practice (right click and "Reselect all SmartList Selections" daily)   Diet/type: Pureed dysphagia 1 diet. Pull NGT once tolerating adequate oral intake.  DVT prophylaxis prophylactic heparin  Pressure ulcer(s): pressure ulcer assessment deferred  GI prophylaxis: PPI Lines: N/A Foley:  Yes, and it is still needed Code Status:  DNR/DNI Last date of multidisciplinary goals of care discussion [4/13]  Arlina Lair, MD [facility] ICU Physician [facility]  Critical Care  Pager: [phone] Mobile: [phone] After hours: [phone].   01/18/2024, 1:21 PM

## 2024-01-18 NOTE — Plan of Care (Signed)
 Progressing well. No agitation or noncompliance overnight
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
## 2024-01-19 DIAGNOSIS — Z8673 Personal history of transient ischemic attack (TIA), and cerebral infarction without residual deficits: Secondary | ICD-10-CM

## 2024-01-19 DIAGNOSIS — I10 Essential (primary) hypertension: Secondary | ICD-10-CM

## 2024-01-19 DIAGNOSIS — R569 Unspecified convulsions: Secondary | ICD-10-CM | POA: Diagnosis not present

## 2024-01-19 LAB — GLUCOSE, CAPILLARY
Glucose-Capillary: 107 mg/dL — ABNORMAL HIGH (ref 70–99)
Glucose-Capillary: 111 mg/dL — ABNORMAL HIGH (ref 70–99)
Glucose-Capillary: 141 mg/dL — ABNORMAL HIGH (ref 70–99)
Glucose-Capillary: 146 mg/dL — ABNORMAL HIGH (ref 70–99)

## 2024-01-19 MED ORDER — ENSURE ENLIVE PO LIQD
237.0000 mL | Freq: Two times a day (BID) | ORAL | Status: DC
  Administered 2024-01-19 – 2024-01-20 (×2): 237 mL via ORAL

## 2024-01-19 NOTE — Assessment & Plan Note (Signed)
 Patient was found to be in refractory status epilepticus requiring intubation on admission.  Neurology is on board. Likely due to extensive prior stroke. Initially started on Keppra-later switched to Depakote due to agitation. - Continue with Depakote -PT is recommending CIR but he is not a good candidate

## 2024-01-19 NOTE — TOC Progression Note (Signed)
 Transition of Care [facility]) - Progression Note    Patient Details  Name: Leonard [name] MRN: [id number] Date of Birth: [birth year]  Transition of Care [facility]) CM/SW Contact  Jonathan Neighbor, RN Phone Number: 01/19/2024, 2:50 PM  Clinical Narrative:     Asked CIR about potential work up. TOC following       Expected Discharge Plan and Services                                               Social Determinants of Health (SDOH) Interventions SDOH Screenings   Food Insecurity: No Food Insecurity (01/15/2024)  Housing: Low Risk  (01/15/2024)  Transportation Needs: No Transportation Needs (01/15/2024)  Utilities: Not At Risk (01/15/2024)  Alcohol Screen: Low Risk  (05/07/2022)  Depression (PHQ2-9): Low Risk  (08/02/2022)  Social Connections: Moderately Isolated (01/15/2024)  Tobacco Use: Low Risk  (01/15/2024)    Readmission Risk Interventions     No data to display

## 2024-01-19 NOTE — Progress Notes (Signed)
 Inpatient Rehab Admissions Coordinator:   Per therapy recommendations, patient was screened for CIR candidacy by Wandalee Gust, MS, CCC-SLP. At this time, Pt. does not appear to demonstrate medical necessity to justify in hospital rehabilitation/CIR and I do not think payor will approve CIR. I will not pursue a rehab consult for this Pt.   Recommend other rehab venues to be pursued.  Please contact me with any questions.  Wandalee Gust, MS, CCC-SLP Rehab Admissions Coordinator  [phone] (celll) [phone] (office)

## 2024-01-19 NOTE — Progress Notes (Signed)
  Progress Note   Patient: Leonard [name] Herling ZOX:[id number] DOB: 1955/11/25 DOA: 01/15/2024     4 DOS: the patient was seen and examined on 01/19/2024   Brief hospital course: PCCM transfer for 01/19/24  Taken from prior notes.  68 yo M PMH HTN, medication non-adherence, DNR status (pre-arrest interventions desired), GERD, prior stroke w residual R sided wknss, who presented to ED 4/10 w R sided weakness, L gaze preference and aphasia. Brought in as code stroke. LKW 4/10 0800. He was combative in ED and required intubation to facilitate scans. Additionally was hypertensive SBP > 200 requiring cleviprex.  CT H, CTA head/neck did not reveal ICH/acute ischemic stroke / LVO. EEG concerning for status epilepticus-patient was initially started on Keppra and later due to agitation changed to Depakote.  Keppra was discontinued.  Patient was mostly noncompliant with his medications. Patient was extubated on 4/11 and remained stable. PT evaluated him and recommending CIR  4/14: Vital stable.  No labs today.  No new deficit.  Patient is not a candidate for CIR-likely will need SNF  Assessment and Plan: * Seizure [facility]) Patient was found to be in refractory status epilepticus requiring intubation on admission.  Neurology is on board. Likely due to extensive prior stroke. Initially started on Keppra-later switched to Depakote due to agitation. - Continue with Depakote -PT is recommending CIR but he is not a good candidate  History of stroke With residual right-sided deficit.  CT head and MRI brain done on admission was negative for any acute abnormality.  Did show a large remote right MCA infarct and chronic microvascular disease -Continue with aspirin and statin  Essential hypertension Patient with significantly elevated blood pressure on admission initially requiring Cleviprex.  History of medication noncompliance Blood pressure currently within goal -Continue amlodipine, HCTZ, lisinopril -As needed  hydralazine      Subjective: Patient was seen and examined today.  No new concern.  He appears calm.  Physical Exam: Vitals:   01/19/24 0500 01/19/24 0526 01/19/24 0635 01/19/24 0754  BP:  (!) 170/93 (!) 173/90 138/72  Pulse:  92 91 78  Resp:  20  16  Temp:  98.3 F (36.8 C)  98.4 F (36.9 C)  TempSrc:      SpO2:  100%  94%  Weight: 81.3 kg     Height:       General.  Frail gentleman, in no acute distress. Pulmonary.  Lungs clear bilaterally, normal respiratory effort. CV.  Regular rate and rhythm, no JVD, rub or murmur. Abdomen.  Soft, nontender, nondistended, BS positive. CNS.  Alert and oriented .  No new focal neurologic deficit. Extremities.  No edema, no cyanosis, pulses intact and symmetrical.  Data Reviewed: Prior data reviewed  Family Communication:   Disposition: Status is: Inpatient Remains inpatient appropriate because: Severity of illness  Planned Discharge Destination: Skilled nursing facility  DVT prophylaxis.  Subcu heparin Time spent: 45 minutes  This record has been created using Conservation officer, historic buildings. Errors have been sought and corrected,but [place] not always be located. Such creation errors do not reflect on the standard of care.   Author: Luna Salinas, MD 01/19/2024 5:55 PM  For on call review [email or web address].

## 2024-01-19 NOTE — Plan of Care (Signed)

## 2024-01-19 NOTE — Assessment & Plan Note (Signed)
 With residual right-sided deficit.  CT head and MRI brain done on admission was negative for any acute abnormality.  Did show a large remote right MCA infarct and chronic microvascular disease -Continue with aspirin and statin

## 2024-01-19 NOTE — Assessment & Plan Note (Signed)
 Patient with significantly elevated blood pressure on admission initially requiring Cleviprex.  History of medication noncompliance Blood pressure currently within goal -Continue amlodipine, HCTZ, lisinopril -As needed hydralazine

## 2024-01-19 NOTE — Progress Notes (Signed)
 Speech Language Pathology Treatment: Dysphagia  Patient Details Name: Leonard [name] MRN: [id number] DOB: [birth year] Today's Date: 01/19/2024 Time: 1445-1500 SLP Time Calculation (min) (ACUTE ONLY): 15 min  Assessment / Plan / Recommendation Clinical Impression  Patient seen by SLP for skilled treatment focused on dysphagia goals. Patient was awake, alert, cooperative with flat affect. He was in recliner when SLP arrived but had slid down some so SLP assisted with repositioning. He was able to feed self liquids via straw sips and feed self bites of dysphagia 2 (minced); canned, diced peaches. He exhibited prolonged mastication but swallow initiation appeared timely. No overt s/s aspiration observed. After a few bites of peaches, patient indicated he was done. He is not oriented to place, exhibits confusion and requires frequent cues to initiate and maintain attention to tasks. SLP will continue to follow but recommending stay on Dys 1 (puree) solids, thin liquids at this time.   HPI HPI: Pt is a 68 y/o male presenting on 4/10 with AMS, L sided gaze, R sided weakness and global aphasia. Post intubation CT with encephalomalacia in R hemisphere from old CVA but no acute stroke, MRI negative. Per neurology/CCM concern for seizures, but EEG negative. Extubated 4/11. Per CCM seizure with Todd's paralysis.  PMH includes: large right CVA in April of 2022,  medication non compliance, HTN. Pt was followed by SLP for cognition, speech and swallowing after 2022 CVA (acute care, CIR, then OP at [facility]).      SLP Plan  Continue with current plan of care      Recommendations for follow up therapy are one component of a multi-disciplinary discharge planning process, led by the attending physician.  Recommendations [place] be updated based on patient status, additional functional criteria and insurance authorization.    Recommendations  Diet recommendations: Dysphagia 1 (puree);Thin liquid Liquids provided via:  Cup;Straw Medication Administration: Whole meds with puree Supervision: Patient able to self feed;Full supervision/cueing for compensatory strategies Compensations: Slow rate;Small sips/bites;Minimize environmental distractions Postural Changes and/or Swallow Maneuvers: Seated upright 90 degrees                  Oral care BID   Frequent or constant Supervision/Assistance Dysphagia, oral phase (R13.11)     Continue with current plan of care     Jacqualine Mater, MA, CCC-SLP Speech Therapy

## 2024-01-19 NOTE — Care Management Important Message (Signed)
 Important Message  Patient Details  Name: Leonard [name] MRN: [id number] Date of Birth: 01/13/1956   Important Message Given:  Yes - Medicare IM     Wynonia Hedges 01/19/2024, 3:28 PM

## 2024-01-19 NOTE — Hospital Course (Signed)
 PCCM transfer for 01/19/24  Taken from prior notes.  68 yo M PMH HTN, medication non-adherence, DNR status (pre-arrest interventions desired), GERD, prior stroke w residual R sided wknss, who presented to ED 4/10 w R sided weakness, L gaze preference and aphasia. Brought in as code stroke. LKW 4/10 0800. He was combative in ED and required intubation to facilitate scans. Additionally was hypertensive SBP > 200 requiring cleviprex.  CT H, CTA head/neck did not reveal ICH/acute ischemic stroke / LVO. EEG concerning for status epilepticus-patient was initially started on Keppra and later due to agitation changed to Depakote.  Keppra was discontinued.  Patient was mostly noncompliant with his medications. Patient was extubated on 4/11 and remained stable. PT evaluated him and recommending CIR  4/14: Vital stable.  No labs today.  No new deficit.  Patient is not a candidate for CIR-likely will need SNF.  4/15: Patient remained hemodynamically stable.  Patient and family wants to go home instead of SNF.  Maximum home health services ordered. Patient apparently was not taking his home medications.  He was started on multiple antihypertensives and they need titration by PCP.  Blood pressure improving.  Patient is also started on baby aspirin and statin.  He is also being discharged on Depakote for seizures.  Patient need to continue on current medications.  RN was also ordered to help with medication so he can stay compliant.  Patient will continue on current medications and need to have a close follow-up with his providers for further assistance.

## 2024-01-19 NOTE — Plan of Care (Signed)
  Problem: Education: Goal: Ability to describe self-care measures that may prevent or decrease complications (Diabetes Survival Skills Education) will improve Outcome: Progressing Goal: Individualized Educational Video(s) Outcome: Progressing   Problem: Coping: Goal: Ability to adjust to condition or change in health will improve Outcome: Progressing   Problem: Fluid Volume: Goal: Ability to maintain a balanced intake and output will improve Outcome: Progressing   Problem: Health Behavior/Discharge Planning: Goal: Ability to identify and utilize available resources and services will improve Outcome: Progressing Goal: Ability to manage health-related needs will improve Outcome: Progressing   Problem: Metabolic: Goal: Ability to maintain appropriate glucose levels will improve Outcome: Progressing   Problem: Nutritional: Goal: Maintenance of adequate nutrition will improve Outcome: Progressing Goal: Progress toward achieving an optimal weight will improve Outcome: Progressing   Problem: Skin Integrity: Goal: Risk for impaired skin integrity will decrease Outcome: Progressing   Problem: Tissue Perfusion: Goal: Adequacy of tissue perfusion will improve Outcome: Progressing   Problem: Activity: Goal: Ability to tolerate increased activity will improve Outcome: Progressing   Problem: Respiratory: Goal: Ability to maintain a clear airway and adequate ventilation will improve Outcome: Progressing   Problem: Role Relationship: Goal: Method of communication will improve Outcome: Progressing   Problem: Education: Goal: Knowledge of General Education information will improve Description: Including pain rating scale, medication(s)/side effects and non-pharmacologic comfort measures Outcome: Progressing   Problem: Health Behavior/Discharge Planning: Goal: Ability to manage health-related needs will improve Outcome: Progressing   Problem: Clinical Measurements: Goal:  Ability to maintain clinical measurements within normal limits will improve Outcome: Progressing Goal: Will remain free from infection Outcome: Progressing Goal: Diagnostic test results will improve Outcome: Progressing Goal: Respiratory complications will improve Outcome: Progressing Goal: Cardiovascular complication will be avoided Outcome: Progressing   Problem: Activity: Goal: Risk for activity intolerance will decrease Outcome: Progressing   Problem: Nutrition: Goal: Adequate nutrition will be maintained Outcome: Progressing   Problem: Coping: Goal: Level of anxiety will decrease Outcome: Progressing   Problem: Elimination: Goal: Will not experience complications related to bowel motility Outcome: Progressing Goal: Will not experience complications related to urinary retention Outcome: Progressing   Problem: Pain Managment: Goal: General experience of comfort will improve and/or be controlled Outcome: Progressing   Problem: Safety: Goal: Ability to remain free from injury will improve Outcome: Progressing   Problem: Skin Integrity: Goal: Risk for impaired skin integrity will decrease Outcome: Progressing   Problem: Safety: Goal: Non-violent Restraint(s) Outcome: Progressing

## 2024-01-19 NOTE — Progress Notes (Signed)
 Nutrition Follow-up  DOCUMENTATION CODES:   Not applicable  INTERVENTION:  Ensure Enlive po BID, each supplement provides 350 kcal and 20 grams of protein.  Encourage PO intake  NUTRITION DIAGNOSIS:   Inadequate oral intake related to acute illness as evidenced by NPO status. *progressing- diet advanced  GOAL:   Patient will meet greater than or equal to 90% of their needs  MONITOR:   Diet advancement, Labs, I & O's  REASON FOR ASSESSMENT:   Consult Assessment of nutrition requirement/status, Enteral/tube feeding initiation and management  ASSESSMENT:   68 y.o male who presented to the ED as code stroke with R sided weakness, left gaze preference and aphasia. 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 PMH: Prior stroke with residual left sided weakness, GERD, noncompliance wiht medicines.  Diet advanced to Dys 1 on 4/13. NG tube removed on 4/13. RN reports pt eating 25% of lunch and 75% of dinner yesterday. Pt had not completed breakfast at time of RD visit this morning. RD to order ONS to assist in PO intake.   Diet Order:   Diet Order             DIET - DYS 1 Room service appropriate? Yes with Assist; Fluid consistency: Thin  Diet effective now                   EDUCATION NEEDS:   Not appropriate for education at this time  Skin:  Skin Assessment: Reviewed RN Assessment  Last BM:  4/13  Height:   Ht Readings from Last 1 Encounters:  01/15/24 5\' 10"  (1.778 m)    Weight:   Wt Readings from Last 1 Encounters:  01/19/24 81.3 kg    Ideal Body Weight:  68.2 kg  BMI:  Body mass index is 25.72 kg/m.  Estimated Nutritional Needs:   Kcal:  2000-2200 kcal  Protein:  100-120 gm  Fluid:  >/=2L/day  Laren Player, MPH, RD, LDN Clinical Dietitian Contact information can be found at [facility].

## 2024-01-19 NOTE — Progress Notes (Signed)
 Physical Therapy Treatment Patient Details Name: Ossiel Marchio Gerrard MRN: [id number] DOB: [birth year] Today's Date: 01/19/2024   History of Present Illness Pt is a 68 y/o male presenting on 4/10 with AMS, L sided gaze, R sided weakness and global aphasia. Post intubation CT with encephalomalacia in R hemisphere from old CVA but no acute stroke, MRI negative. Per neurology/CCM concern for seizures, but EEG negative. Extubated 4/11. Per CCM seizure with Todd's paralysis.  PMH includes: CVA, medication non compliance, HTN.    PT Comments  Patient progressing with mobility, able to walk in the room with A for balance and L side awareness.  He was eager for up to chair for lunch, so did not walk in hallway, though feel he could have and states was just up to bathroom with nursing assist.  Patient with continued limited safety/deficit awareness and weakness.  Continue to feel he would benefit from inpatient rehab (>3 hours/day) prior to d/c home.      If plan is discharge home, recommend the following: A little help with walking and/or transfers;A lot of help with bathing/dressing/bathroom;Assistance with cooking/housework;Direct supervision/assist for financial management;Assist for transportation;Help with stairs or ramp for entrance;Two people to help with bathing/dressing/bathroom   Can travel by private vehicle        Equipment Recommendations  Other (comment) (TBA)    Recommendations for Other Services       Precautions / Restrictions Precautions Precautions: Fall Recall of Precautions/Restrictions: Impaired     Mobility  Bed Mobility Overal bed mobility: Needs Assistance Bed Mobility: Rolling, Sidelying to Sit Rolling: Min assist Sidelying to sit: Min assist, Mod assist       General bed mobility comments: assist for technique and cues, assist for L UE and some to lift trunk, scoot L side to EOB    Transfers Overall transfer level: Needs assistance   Transfers: Sit to/from  Stand Sit to Stand: Min assist           General transfer comment: A for safety, pt with decreased L side awareness    Ambulation/Gait Ambulation/Gait assistance: Min assist Gait Distance (Feet): 12 Feet Assistive device: None Gait Pattern/deviations: Step-through pattern, Step-to pattern, Decreased stride length       General Gait Details: assist for balance, to clear bed on L side walking around bed to recliner and to sit safely as sitting at a distance from Engineer, production     Tilt Bed    Modified Rankin (Stroke Patients Only)       Balance Overall balance assessment: Needs assistance Sitting-balance support: Feet supported Sitting balance-Leahy Scale: Fair     Standing balance support: No upper extremity supported, Single extremity supported, During functional activity Standing balance-Leahy Scale: Poor Standing balance comment: assist for balance/safety with impaired L side awareness in standing                            Communication    Cognition Arousal: Alert Behavior During Therapy: WFL for tasks assessed/performed   PT - Cognitive impairments: Problem solving, Safety/Judgement                       PT - Cognition Comments: slow processing, assist for safety Following commands: Intact      Cueing    Exercises      General Comments  Pertinent Vitals/Pain Pain Assessment Pain Assessment: No/denies pain    Home Living                          Prior Function            PT Goals (current goals can now be found in the care plan section) Progress towards PT goals: Progressing toward goals    Frequency    Min 3X/week      PT Plan      Co-evaluation              AM-PAC PT "6 Clicks" Mobility   Outcome Measure  Help needed turning from your back to your side while in a flat bed without using bedrails?: A Lot Help needed moving from lying on  your back to sitting on the side of a flat bed without using bedrails?: A Lot Help needed moving to and from a bed to a chair (including a wheelchair)?: A Lot Help needed standing up from a chair using your arms (e.g., wheelchair or bedside chair)?: A Little Help needed to walk in hospital room?: Total Help needed climbing 3-5 steps with a railing? : Total 6 Click Score: 11    End of Session Equipment Utilized During Treatment: Gait belt Activity Tolerance: Other (comment) (limited focus, pt eager to eat lunch that just arrived in the room) Patient left: in chair;with call bell/phone within reach;with chair alarm set   PT Visit Diagnosis: Unsteadiness on feet (R26.81);Muscle weakness (generalized) (M62.81);Other abnormalities of gait and mobility (R26.89)     Time: 1144-1200 PT Time Calculation (min) (ACUTE ONLY): 16 min  Charges:    $Therapeutic Activity: 8-22 mins PT General Charges $$ ACUTE PT VISIT: 1 Visit                     Abigail Hoff, PT Acute Rehabilitation Services [phone] 01/19/2024    Marley Simmers 01/19/2024, 2:29 PM

## 2024-01-20 ENCOUNTER — Telehealth: Payer: Self-pay

## 2024-01-20 DIAGNOSIS — Z8673 Personal history of transient ischemic attack (TIA), and cerebral infarction without residual deficits: Secondary | ICD-10-CM | POA: Diagnosis not present

## 2024-01-20 DIAGNOSIS — R569 Unspecified convulsions: Secondary | ICD-10-CM | POA: Diagnosis not present

## 2024-01-20 DIAGNOSIS — I10 Essential (primary) hypertension: Secondary | ICD-10-CM | POA: Diagnosis not present

## 2024-01-20 LAB — GLUCOSE, CAPILLARY
Glucose-Capillary: 115 mg/dL — ABNORMAL HIGH (ref 70–99)
Glucose-Capillary: 93 mg/dL (ref 70–99)
Glucose-Capillary: 116 mg/dL — ABNORMAL HIGH (ref 70–99)
Glucose-Capillary: 115 mg/dL — ABNORMAL HIGH (ref 70–99)

## 2024-01-20 MED ORDER — NAPROXEN SODIUM 220 MG PO TABS: 220.0000 mg | ORAL_TABLET | Freq: Two times a day (BID) | ORAL | Status: AC | PRN

## 2024-01-20 MED ORDER — ASPIRIN 81 MG PO TBEC: 81.0000 mg | DELAYED_RELEASE_TABLET | Freq: Every day | ORAL | 12 refills | Status: AC

## 2024-01-20 MED ORDER — LOVASTATIN ER 20 MG PO TB24: 20.0000 mg | ORAL_TABLET | Freq: Every day | ORAL | 1 refills | Status: DC

## 2024-01-20 MED ORDER — LISINOPRIL 20 MG PO TABS: 20.0000 mg | ORAL_TABLET | Freq: Every day | ORAL | 2 refills | Status: DC

## 2024-01-20 MED ORDER — HYDROCHLOROTHIAZIDE 12.5 MG PO TABS: 12.5000 mg | ORAL_TABLET | Freq: Every day | ORAL | 2 refills | Status: DC

## 2024-01-20 MED ORDER — DIVALPROEX SODIUM 500 MG PO DR TAB: 500.0000 mg | DELAYED_RELEASE_TABLET | Freq: Two times a day (BID) | ORAL | 2 refills | Status: DC

## 2024-01-20 MED ORDER — ENSURE ENLIVE PO LIQD: 237.0000 mL | Freq: Two times a day (BID) | ORAL | 12 refills | Status: AC

## 2024-01-20 MED ORDER — ATORVASTATIN CALCIUM 40 MG PO TABS
40.0000 mg | ORAL_TABLET | Freq: Every day | ORAL | 1 refills | Status: DC
Start: 2024-01-21 — End: 2024-01-20

## 2024-01-20 MED ORDER — AMLODIPINE BESYLATE 10 MG PO TABS: 10.0000 mg | ORAL_TABLET | Freq: Every day | ORAL | 2 refills | Status: DC

## 2024-01-20 MED ORDER — POLYETHYLENE GLYCOL 3350 17 G PO PACK: 17.0000 g | PACK | Freq: Every day | ORAL | 0 refills | Status: AC | PRN

## 2024-01-20 NOTE — TOC Transition Note (Signed)
 Transition of Care [facility]) - Discharge Note   Patient Details  Name: Leonard [name] MRN: [id number] Date of Birth: [birth year]  Transition of Care [facility]) CM/SW Contact:  Jonathan Neighbor, RN Phone Number: 01/20/2024, 10:46 AM   Clinical Narrative:     Pt is discharging home with home health services through [facility]. Information on the AVS. [facility] will contact him for the first home visit. Pt has needed DME at home. Wife will provide needed transportation.  Final next level of care: Home w Home Health Services Barriers to Discharge: No Barriers Identified   Patient Goals and CMS Choice   CMS Medicare.gov Compare Post Acute Care list provided to:: Patient Represenative (must comment) Choice offered to / list presented to : Spouse      Discharge Placement                       Discharge Plan and Services Additional resources added to the After Visit Summary for                            [facility] Arranged: PT, OT, Nurse's Aide, Speech Therapy HH Agency: [facility] Date [facility] Contacted: 01/20/24   Representative spoke with at [facility] Agency: Imelda Man  Social Drivers of Health (SDOH) Interventions SDOH Screenings   Food Insecurity: No Food Insecurity (01/15/2024)  Housing: Low Risk  (01/15/2024)  Transportation Needs: No Transportation Needs (01/15/2024)  Utilities: Not At Risk (01/15/2024)  Alcohol Screen: Low Risk  (05/07/2022)  Depression (PHQ2-9): Low Risk  (08/02/2022)  Social Connections: Moderately Isolated (01/15/2024)  Tobacco Use: Low Risk  (01/15/2024)     Readmission Risk Interventions     No data to display

## 2024-01-20 NOTE — Progress Notes (Signed)
 Reviewed AVS, patient expressed understanding of medications, MD follow up reviewed.   Removed IV, Site clean, dry and intact.  Patient states all belongings brought to the hospital at time of admission are accounted for and packed to take home.  Patient requested to eat lunch before going to discharge lounge to wait for wife.

## 2024-01-20 NOTE — Discharge Summary (Addendum)
 Physician Discharge Summary   Patient: Leonard [name] MRN: [id number] DOB: [birth year]  Admit date:     01/15/2024  Discharge date: 01/20/24  Discharge Physician: Luna Salinas   PCP: Laurence Pons, NP   Recommendations at discharge:  Please obtain CBC and BMP on follow-up Follow-up with primary care provider within a week Follow-up with neurology  Discharge Diagnoses: Principal Problem:   Seizure [facility]) Active Problems:   History of stroke   Essential hypertension   Hospital Course: PCCM transfer for 01/19/24  Taken from prior notes.  68 yo M PMH HTN, medication non-adherence, DNR status (pre-arrest interventions desired), GERD, prior stroke w residual R sided wknss, who presented to ED 4/10 w R sided weakness, L gaze preference and aphasia. Brought in as code stroke. LKW 4/10 0800. He was combative in ED and required intubation to facilitate scans. Additionally was hypertensive SBP > 200 requiring cleviprex.  CT H, CTA head/neck did not reveal ICH/acute ischemic stroke / LVO. EEG concerning for status epilepticus-patient was initially started on Keppra and later due to agitation changed to Depakote.  Keppra was discontinued.  Patient was mostly noncompliant with his medications. Patient was extubated on 4/11 and remained stable. PT evaluated him and recommending CIR  4/14: Vital stable.  No labs today.  No new deficit.  Patient is not a candidate for CIR-likely will need SNF.  4/15: Patient remained hemodynamically stable.  Patient and family wants to go home instead of SNF.  Maximum home health services ordered. Patient apparently was not taking his home medications.  He was started on multiple antihypertensives and they need titration by PCP.  Blood pressure improving.  Patient is also started on baby aspirin and statin.  Initially prescribed atorvastatin but wife requested liver statin stating that he has tried atorvastatin in the past and that makes his legs hurt a lot.  He  is also being discharged on Depakote for seizures.  Patient need to continue on current medications.  RN was also ordered to help with medication so he can stay compliant.  Patient will continue on current medications and need to have a close follow-up with his providers for further assistance.  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  Neurology is on board. Likely due to extensive prior stroke. Initially started on Keppra-later switched to Depakote due to agitation. - Continue with Depakote -PT is recommending CIR but he is not a good candidate - He wants to go home so is being discharged with home health.  Need to follow-up with neurology as outpatient  History of stroke With residual right-sided deficit.  CT head and MRI brain done on admission was negative for any acute abnormality.  Did show a large remote right MCA infarct and chronic microvascular disease -Continue with aspirin and statin  Essential hypertension Patient with significantly elevated blood pressure on admission initially requiring Cleviprex.  History of medication noncompliance Blood pressure mildly elevated -Continue amlodipine, HCTZ, lisinopril Amlodipine dose increased to 10 mg daily-PCP should be able to titrate antihypertensives. -As needed hydralazine       Consultants: PCCM.  Neurology Procedures performed: EEG Disposition: Home health Diet recommendation:  Discharge Diet Orders (From admission, onward)     Start     Ordered   01/20/24 0000  Diet - low sodium heart healthy        01/20/24 1033           Regular diet  DISCHARGE MEDICATION: Allergies as of 01/20/2024   No Known Allergies      Medication List     TAKE these medications    amLODipine 10 MG tablet Commonly known as: NORVASC Take 1 tablet (10 mg total) by mouth daily. Start taking on: January 21, 2024   aspirin EC 81 MG tablet Take  1 tablet (81 mg total) by mouth daily. Swallow whole. Start taking on: January 21, 2024   atorvastatin 40 MG tablet Commonly known as: LIPITOR Take 1 tablet (40 mg total) by mouth daily. Start taking on: January 21, 2024   divalproex 500 MG DR tablet Commonly known as: DEPAKOTE Take 1 tablet (500 mg total) by mouth every 12 (twelve) hours.   feeding supplement Liqd Take 237 mLs by mouth 2 (two) times daily between meals.   hydrochlorothiazide 12.5 MG tablet Commonly known as: HYDRODIURIL Take 1 tablet (12.5 mg total) by mouth daily. Start taking on: January 21, 2024   lisinopril 20 MG tablet Commonly known as: ZESTRIL Take 1 tablet (20 mg total) by mouth daily. Start taking on: January 21, 2024   naproxen sodium 220 MG tablet Commonly known as: ALEVE Take 1 tablet (220 mg total) by mouth 2 (two) times daily as needed. What changed:  when to take this reasons to take this   omeprazole 20 MG tablet Commonly known as: PRILOSEC OTC Take 20 mg by mouth daily.   polyethylene glycol 17 g packet Commonly known as: MIRALAX / GLYCOLAX Take 17 g by mouth daily as needed for moderate constipation.        Discharge Exam: Filed Weights   01/18/24 0709 01/19/24 0500 01/20/24 0358  Weight: 82.2 kg 81.3 kg 74 kg   General.  Well-developed gentleman, in no acute distress. Pulmonary.  Lungs clear bilaterally, normal respiratory effort. CV.  Regular rate and rhythm, no JVD, rub or murmur. Abdomen.  Soft, nontender, nondistended, BS positive. CNS.  Alert and oriented .  No new focal neurologic deficit. Extremities.  No edema, no cyanosis, pulses intact and symmetrical. Psychiatry.  Judgment and insight appears normal.   Condition at discharge: stable  The results of significant diagnostics from this hospitalization (including imaging, microbiology, ancillary and laboratory) are listed below for reference.   Imaging Studies: DG Abd Portable 1V Result Date: 01/16/2024 CLINICAL DATA:   [id number] Encounter for feeding tube placement [id number]. EXAM: PORTABLE ABDOMEN - 1 VIEW COMPARISON:  01/15/2024. FINDINGS: The bowel gas pattern is non-obstructive. No evidence of pneumoperitoneum, within the limitations of a supine film. No acute osseous abnormalities. The soft tissues are within normal limits. Surgical changes, devices, tubes and lines: An dobhoff tube extends below diaphragm, tip and sideport overlying the stomach, overlying the proximal body. IMPRESSION: An dobhoff tube extends below diaphragm, tip and sideport overlying the stomach, overlying the proximal body. Electronically Signed   By: Jules Schick M.D.   On: 01/16/2024 15:48   Overnight EEG with video Result Date: 01/16/2024 Charlsie Quest, MD     01/16/2024  5:41 AM Patient Name: Leonard [name] MRN: [id number] Epilepsy Attending: Charlsie Quest Referring Physician/Provider: Elmer Picker, NP Duration: 01/15/2024 1442 to 01/16/2024 0530 Patient history: 68 y.o. male with a past medical history of stroke with residual left sided weakness presenting with altered mental status, global aphasia, right sided weakness. EEG to evaluate for seizure Level of alertness:  comatose/ lethargic AEDs during EEG study: LEV, propofol Technical aspects: This EEG study was done with scalp electrodes positioned according to the  10-20 International system of electrode placement. Electrical activity was reviewed with band pass filter of 1-70Hz , sensitivity of 7 uV/mm, display speed of 56mm/sec with a 60Hz  notched filter applied as appropriate. EEG data were recorded continuously and digitally stored.  Video monitoring was available and reviewed as appropriate. Description: EEG showed continuous generalized 3 to 6 Hz theta-delta slowing admixed with 12-15Hz  beta activity. Hyperventilation and photic stimulation were not performed.   EEG was disconnected between 01/15/2024 1721 to 1915 for MR brain. ABNORMALITY - Continuous slow, generalized IMPRESSION: This study  is suggestive of moderate diffuse encephalopathy, nonspecific etiology but likely related to sedation. No seizures or epileptiform discharges were seen throughout the recording. Charlsie Quest   MR BRAIN W WO CONTRAST Result Date: 01/15/2024 CLINICAL DATA:  Initial evaluation for new onset seizure. EXAM: MRI HEAD WITHOUT AND WITH CONTRAST TECHNIQUE: Multiplanar, multiecho pulse sequences of the brain and surrounding structures were obtained without and with intravenous contrast. CONTRAST:  8mL GADAVIST GADOBUTROL 1 MMOL/ML IV SOLN COMPARISON:  Comparison made with prior study from earlier the same day. FINDINGS: Brain: Cerebral volume within normal limits. Patchy T2/FLAIR hyperintensity involving the periventricular deep white matter both cerebral hemispheres, consistent with chronic small vessel ischemic disease, moderately advanced in nature. Extensive encephalomalacia and gliosis involving the right frontal lobe, consistent with a chronic right MCA distribution infarct. Scattered areas of chronic hemosiderin staining within this region. No evidence for acute or subacute infarct. Gray-white matter differentiation otherwise maintained. No other areas of chronic cortical infarction. No other acute or chronic intracranial blood products. No mass lesion, midline shift or mass effect. No hydrocephalus or extra-axial fluid collection. Pituitary gland within normal limits. No abnormal enhancement. Vascular: Major intracranial vascular flow voids are maintained. Skull and upper cervical spine: Craniocervical junction within normal limits. Bone marrow signal intensity normal. No scalp soft tissue abnormality. Sinuses/Orbits: Globes and orbital soft tissues within normal limits. Mild scattered mucosal thickening noted about the paranasal sinuses. No significant mastoid effusion. Patient is intubated. Other: None. IMPRESSION: 1. No acute intracranial abnormality. 2. Large remote right MCA distribution infarct with  underlying chronic microvascular ischemic disease. Electronically Signed   By: Rise Mu M.D.   On: 01/15/2024 23:22   DG Abd 1 View Result Date: 01/15/2024 CLINICAL DATA:  Orogastric tube placement. EXAM: ABDOMEN - 1 VIEW COMPARISON:  January 25, 2021. FINDINGS: Distal tip of nasogastric tube is seen in expected position of proximal stomach. IMPRESSION: Nasogastric tube tip seen in expected position of proximal stomach. Electronically Signed   By: Lupita Raider M.D.   On: 01/15/2024 14:49   DG Chest Portable 1 View Result Date: 01/15/2024 CLINICAL DATA:  Intubation. EXAM: PORTABLE CHEST 1 VIEW COMPARISON:  February 03, 2021. FINDINGS: The heart size and mediastinal contours are within normal limits. Endotracheal tube is in grossly good position. Both lungs are clear. The visualized skeletal structures are unremarkable. IMPRESSION: Endotracheal tube in grossly good position. No acute cardiopulmonary abnormality seen. Electronically Signed   By: Lupita Raider M.D.   On: 01/15/2024 14:48   CT ANGIO HEAD NECK W WO CM W PERF (CODE STROKE) Addendum Date: 01/15/2024 ADDENDUM REPORT: 01/15/2024 14:05 ADDENDUM: The above was relayed via text pager to Dr. Amada Jupiter On 01/15/2024 at 14:05 . Electronically Signed   By: Marin Roberts M.D.   On: 01/15/2024 14:05   Result Date: 01/15/2024 CLINICAL DATA:  Sick, acute, stroke suspected. Left-sided gaze. Unable to speak. The EXAM: CT ANGIOGRAPHY HEAD AND NECK CT PERFUSION BRAIN  TECHNIQUE: Multidetector CT imaging of the head and neck was performed using the standard protocol during bolus administration of intravenous contrast. Multiplanar CT image reconstructions and MIPs were obtained to evaluate the vascular anatomy. Carotid stenosis measurements (when applicable) are obtained utilizing NASCET criteria, using the distal internal carotid diameter as the denominator. Multiphase CT imaging of the brain was performed following IV bolus contrast injection.  Subsequent parametric perfusion maps were calculated using RAPID software. RADIATION DOSE REDUCTION: This exam was performed according to the departmental dose-optimization program which includes automated exposure control, adjustment of the mA and/or kV according to patient size and/or use of iterative reconstruction technique. CONTRAST:  100mL OMNIPAQUE IOHEXOL 350 MG/ML SOLN COMPARISON:  CT head without contrast 01/15/2024 at 12:42 p.m. CT head without contrast 02/07/2021 and MR head without contrast 01/25/2021. FINDINGS: Brain: A large remote right MCA territory infarct is again seen. Ex vacuo dilation of the right lateral ventricle is present. No definite acute infarct is present. Mild white matter changes are present in the left hemisphere. Left basal ganglia are intact. Left insular ribbon is normal. No acute left-sided infarct is present. No acute hemorrhage or mass lesion is present. The brainstem and cerebellum are within normal limits. Midline structures are within normal limits. Vascular: No hyperdense vessel or unexpected calcification. Skull: Calvarium is intact. No focal lytic or blastic lesions are present. No significant extracranial soft tissue lesion is present. Sinuses/Orbits: The paranasal sinuses and mastoid air cells are clear. The globes and orbits are within normal limits. Review of the MIP images confirms the above findings CTA NECK FINDINGS Aortic arch: A 3 vessel arch configuration is present. Atherosclerotic changes are present in the distal arch. Great vessel origins are within normal limits. No focal stenosis or aneurysm is present. Right carotid system: The right common carotid artery is within normal limits. A stent across the right carotid bifurcation is patent. The cervical right ICA is within normal limits distal to the stent. Left carotid system: The left common carotid artery is within normal limits. Atherosclerotic changes are present at the left bifurcation and proximal left  ICA without significant stenosis. The distal cervical left ICA is normal. Vertebral arteries: The left vertebral artery is the dominant vessel. A high-grade stenosis is present at the origin of the right vertebral artery, new since the prior exam. Atherosclerotic changes present at the origin of the left vertebral artery without significant stenosis. No other stenosis is present in either vertebral artery in the neck. Skeleton: Multilevel degenerative changes are present in the cervical spine. Alignment is anatomic. No focal osseous lesions are present. Other neck: The patient is intubated. The soft tissues of the neck are otherwise unremarkable. Salivary glands are within normal limits. Thyroid is normal. No significant adenopathy is present. No focal mucosal or submucosal lesions are present. Upper chest: The lung apices are clear. The thoracic inlet is within normal limits. Review of the MIP images confirms the above findings CTA HEAD FINDINGS Anterior circulation: Internal carotid arteries are within normal limits from the skull base the ICA termini. The A1 and M1 segments are normal. The right A1 segment is dominant. MCA bifurcations are intact. High-grade stenosis is again noted at the origin of the superior M2 division. High-grade stenosis is present in the proximal M3 division the inferior M2. Segmental narrowing of distal MCA branch vessels is similar the prior study. Emesis scratched at the left MCA and bilateral ACA branch vessels are within normal limits. Posterior circulation: Left vertebral artery is dominant. The  PICA origins are visualized and normal. The vertebrobasilar junction is normal. Moderate Peyton Najjar is present within the basilar artery. No significant stenosis is present. The superior cerebellar arteries are within normal limits bilaterally. Both posterior cerebral arteries originate from the basilar tip. The PCA branch vessels are within normal limits bilaterally. No aneurysm is present.  Venous sinuses: The dural sinuses are patent. The straight sinus and deep cerebral veins are intact. Cortical veins are within normal limits. No significant vascular malformation is evident. Anatomic variants: None CT Brain Perfusion Findings: ASPECTS: 10/10 CBF (<30%) Volume: 5mL Perfusion (Tmax>6.0s) volume: 0mL Mismatch Volume: 0mL Infarction Location:Right MCA IMPRESSION: 1. 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 2. No acute intracranial abnormality or significant interval change. 3. 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 4. High-grade stenosis in a proximal M3 division of the inferior M2 of the right MCA. 5. Segmental narrowing of distal right MCA branch vessels is similar the prior study. 6. 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 7. Patent stent across the right carotid bifurcation. 8. Atherosclerotic changes at the left carotid bifurcation and proximal left ICA without significant stenosis. 9. No other significant proximal stenosis, aneurysm, or branch vessel occlusion within the Circle of Willis. 10. No significant stenosis in the posterior circulation. 11. CT perfusion reflects the remote infarct. No significant area of ischemia is identified. Electronically Signed: By: Marin Roberts M.D. On: 01/15/2024 14:02   CT HEAD CODE STROKE WO CONTRAST Result Date: 01/15/2024 CLINICAL DATA:  Code stroke. Neuro deficit, concern for stroke, unable to speak. EXAM: CT HEAD WITHOUT CONTRAST TECHNIQUE: Contiguous axial images were obtained from the base of the skull through the vertex without intravenous contrast. RADIATION DOSE REDUCTION: This exam was performed according to the departmental dose-optimization program which includes automated exposure control, adjustment of the mA and/or kV according to patient size and/or use of iterative reconstruction technique.  COMPARISON:  MRI head 01/25/2021. FINDINGS: Brain: No acute intracranial hemorrhage. Encephalomalacia in the right frontal lobe and right insula compatible with infarct noted on prior MRI. There are additional areas of hypoattenuation throughout the periventricular and subcortical white matter suggestive of chronic microvascular ischemic changes. There is significant motion artifact and streak artifact along the skull base. Within these limitations there is possible hypoattenuation in the right temporal lobe and right frontoparietal lobes posterior to the region of prior infarct with involvement of the M2, M3, and M6 regions. Mild generalized parenchymal volume loss. No midline shift. The basilar cisterns are patent. Ventricles: Prominence of the ventricles suggesting underlying parenchymal volume loss. Vascular: No hyperdense vessel or unexpected calcification. Skull: No acute or aggressive finding. Orbits: Orbits are symmetric. Sinuses: Mucosal thickening in the right maxillary sinus. Other: Mastoid air cells are clear. ASPECTS [facility] Stroke Program Early CT Score) - Ganglionic level infarction (caudate, lentiform nuclei, internal capsule, insula, M1-M3 cortex): 5 - Supraganglionic infarction (M4-M6 cortex): 2 Total score (0-10 with 10 being normal): 7 IMPRESSION: 1. Examination is significantly limited by motion artifact and streak artifact along the skull base. Within these limitations there is redemonstration of remote right MCA territory infarct. Areas of hypoattenuation more posteriorly within the right temporal lobe and right frontoparietal lobes could reflect new areas of infarct. Recommend correlation with MRI. 2. No acute intracranial hemorrhage. 3. ASPECTS is 7 These results were communicated to Dr. Pearlean Brownie At 12:42 pm on 01/15/2024 by text  page via the [facility] messaging system. Electronically Signed   By: Denny Flack M.D.   On: 01/15/2024 12:42    Microbiology: Results for orders placed or performed  during the hospital encounter of 01/15/24  MRSA Next Gen by PCR, Nasal     Status: None   Collection Time: 01/15/24  6:57 PM   Specimen: Nasal Mucosa; Nasal Swab  Result Value Ref Range Status   MRSA by PCR Next Gen NOT DETECTED NOT DETECTED Final    Comment: (NOTE) The GeneXpert MRSA Assay (FDA approved for NASAL specimens only), is one component of a comprehensive MRSA colonization surveillance program. It is not intended to diagnose MRSA infection nor to guide or monitor treatment for MRSA infections. Test performance is not FDA approved in patients less than 68 years old. Performed at [facility], [address], [place], [place] [address]     Labs: CBC: Recent Labs  Lab 01/15/24 1210 01/15/24 1212 01/15/24 1422 01/16/24 0532  WBC 9.6  --   --  9.2  NEUTROABS 5.7  --   --   --   HGB 15.1 15.6 16.7 12.6*  HCT 45.9 46.0 49.0 37.9*  MCV 84.7  --   --  85.0  PLT 297  --   --  211   Basic Metabolic Panel: Recent Labs  Lab 01/15/24 1210 01/15/24 1212 01/16/24 0532 01/16/24 2237 01/17/24 0243 01/17/24 0749 01/17/24 2013  NA 135   < > 137 141 140 139 141  K 4.1   < > 3.7 3.7 4.2 4.2 3.5  CL 101   < > 104 111 108 107 108  CO2 24  --  20* 19* 21* 18* 19*  GLUCOSE 107*   < > 88 120* 114* 109* 153*  BUN 6*   < > 8 9 10 11 12   CREATININE 0.76   < > 0.94 0.76 0.92 0.73 0.81  CALCIUM 8.8*  --  8.6* 8.7* 8.7* 8.7* 8.8*  MG 2.1  --   --   --   --   --   --    < > = values in this interval not displayed.   Liver Function Tests: Recent Labs  Lab 01/15/24 1210  AST 21  ALT 19  ALKPHOS 70  BILITOT 1.1  PROT 7.4  ALBUMIN 4.2   CBG: Recent Labs  Lab 01/19/24 1254 01/19/24 2109 01/20/24 0112 01/20/24 0355 01/20/24 0900  GLUCAP 146* 111* 115* 93 116*    Discharge time spent: greater than 30 minutes.  This record has been created using Conservation officer, historic buildings. Errors have been sought and corrected,but Lu[place] not always be located. Such creation  errors do not reflect on the standard of care.   Signed: Luna Salinas, MD [facility] 01/20/2024

## 2024-01-20 NOTE — Telephone Encounter (Signed)
 Spoke with [facility] and [facility] and pt had appt next week with alyssa

## 2024-01-20 NOTE — Progress Notes (Signed)
 Physical Therapy Treatment Patient Details Name: Leonard [name] MRN: [id number] DOB: 1956-06-02 Today's Date: 01/20/2024   History of Present Illness Pt is a 68 y/o male presenting on 4/10 with AMS, L sided gaze, R sided weakness and global aphasia. Post intubation CT with encephalomalacia in R hemisphere from old CVA but no acute stroke, MRI negative. Per neurology/CCM concern for seizures, but EEG negative. Extubated 4/11. Per CCM seizure with Todd's paralysis.  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    PT Comments  Pt received in supine, pleasantly agreeable to therapy session and with good participation in transfer and standing exercise instruction. Pt needing assist for seated balance during unsupported dynamic seated tasks (assist with donning pants/shirt) with posterior LOB with challenges to static sitting needing minA to recover and up to minA for dynamic standing tasks, pt with improved standing tolerance and able to perform standing hip flexion >30 sec prior to requesting seated break and STS x 5 in <30 sec with R arm support. Pt continues to benefit from PT services to progress toward functional mobility goals, per chart review family deciding on DC home, would benefit from [facility] services to progress standing balance and strengthening and further assess him for any post-acute DME needs.    If plan is discharge home, recommend the following: A little help with walking and/or transfers;A lot of help with bathing/dressing/bathroom;Assistance with cooking/housework;Direct supervision/assist for financial management;Assist for transportation;Help with stairs or ramp for entrance;Two people to help with bathing/dressing/bathroom   Can travel by private vehicle        Equipment Recommendations  Other (comment) (TBD; Mote benefit from cane if he does not have one but have not yet trialed)    Recommendations for Other Services       Precautions /  Restrictions Precautions Precautions: Fall Recall of Precautions/Restrictions: Impaired Precaution/Restrictions Comments: LUE hemiplegia, seizures Restrictions Weight Bearing Restrictions Per Provider Order: No     Mobility  Bed Mobility Overal bed mobility: Needs Assistance Bed Mobility: Rolling, Sidelying to Sit Rolling: Modified independent (Device/Increase time), Used rails Sidelying to sit: Contact guard assist       General bed mobility comments: To L EOB; pt propping up on LUE, CGA for safety    Transfers Overall transfer level: Needs assistance Equipment used: 1 person hand held assist Transfers: Sit to/from Stand Sit to Stand: Contact guard assist, Min assist           General transfer comment: minA initially, progressing to CGA for reciprocal STS from chair using R arm rest vs pushing from R leg with RUE. Pt supported by chair frame behind his legs upon standing.    Ambulation/Gait Ambulation/Gait assistance: Min assist Gait Distance (Feet): 4 Feet (x2) Assistive device: None, 1 person hand held assist Gait Pattern/deviations: Step-to pattern, Decreased stride length, Trunk flexed, Decreased step length - left, Decreased dorsiflexion - left     Pre-gait activities: standing hip flexion x30 sec with CGA to minA via gait belt General Gait Details: Distance limited due to pt preparing to eat lunch up in chair and imminently discharging home once family arrives, CGA to minA via gait belt/HHA forward/backward x2 steps also in front of chair ~7ft after step pivot to chair from EOB   Stairs             Wheelchair Mobility     Tilt Bed    Modified Rankin (Stroke Patients Only)       Balance Overall  balance assessment: Needs assistance Sitting-balance support: Feet supported, No upper extremity supported Sitting balance-Leahy Scale: Poor Sitting balance - Comments: poor while donning pants due to posterior lean, needing consistent minA for dynamic  seated tasks; fair static sitting without challenge Postural control: Posterior lean Standing balance support: No upper extremity supported, Single extremity supported, During functional activity Standing balance-Leahy Scale: Poor Standing balance comment: assist for balance/safety with impaired L side awareness in standing                            Communication Communication Communication: No apparent difficulties  Cognition Arousal: Alert Behavior During Therapy: Impulsive   PT - Cognitive impairments: Problem solving, Safety/Judgement                       PT - Cognition Comments: Pt mildly impulsive with dressing; eager to go home and states he wants to go [place]fishing [place] the lake tomorrow". PTA reinforces he needs to take his BP medicines once he goes home (with family assist) to make sure he is able to return to PLOF in addition to mobilizing with assist for safety. Following commands: Intact Following commands impaired: Follows one step commands with increased time    Cueing Cueing Techniques: Verbal cues, Tactile cues, Gestural cues  Exercises Other Exercises Other Exercises: standing hip flexion x30 sec unsupported/HHA minA Other Exercises: STS x 5 reciprocal using R arm in <60 sec    General Comments General comments (skin integrity, edema, etc.): No acute s/sx distress, pt agreeable to sit up in recliner at end of session while eating lunch, RN aware; chair alarm on for pt safety      Pertinent Vitals/Pain Pain Assessment Pain Assessment: No/denies pain Pain Intervention(s): Monitored during session, Repositioned    Home Living                          Prior Function            PT Goals (current goals can now be found in the care plan section) Acute Rehab PT Goals Patient Stated Goal: To go home today PT Goal Formulation: With patient/family Time For Goal Achievement: 01/31/24 Progress towards PT goals: Progressing toward goals     Frequency    Min 3X/week      PT Plan      Co-evaluation              AM-PAC PT "6 Clicks" Mobility   Outcome Measure  Help needed turning from your back to your side while in a flat bed without using bedrails?: A Little Help needed moving from lying on your back to sitting on the side of a flat bed without using bedrails?: A Little Help needed moving to and from a bed to a chair (including a wheelchair)?: A Little Help needed standing up from a chair using your arms (e.g., wheelchair or bedside chair)?: A Little Help needed to walk in hospital room?: A Little Help needed climbing 3-5 steps with a railing? : A Lot 6 Click Score: 17    End of Session Equipment Utilized During Treatment: Gait belt Activity Tolerance: Patient tolerated treatment well Patient left: in chair;with call bell/phone within reach;with chair alarm set (eating lunch) Nurse Communication: Mobility status;Other (comment) (awaiting discharge RN vs family members for DC home) PT Visit Diagnosis: Unsteadiness on feet (R26.81);Muscle weakness (generalized) (M62.81);Other abnormalities of gait and mobility (R26.89)  Time: 1235-1250 PT Time Calculation (min) (ACUTE ONLY): 15 min  Charges:    $Therapeutic Exercise: 8-22 mins PT General Charges $$ ACUTE PT VISIT: 1 Visit                     Faaris Arizpe P., PTA Acute Rehabilitation Services Secure Chat Preferred 9a-5:30pm Office: [phone]    Mariel Shope [facility] 01/20/2024, 1:06 PM

## 2024-01-20 NOTE — Progress Notes (Signed)
 Mobility Specialist: Progress Note   01/20/24 1110  Mobility  Activity Ambulated with assistance in hallway  Level of Assistance Minimal assist, patient does 75% or more  Assistive Device None;Other (Comment) (occasional use of handrails)  Distance Ambulated (ft) 250 ft  Activity Response Tolerated well  Mobility Referral Yes  Mobility visit 1 Mobility  Mobility Specialist Start Time (ACUTE ONLY) 0931  Mobility Specialist Stop Time (ACUTE ONLY) 0940  Mobility Specialist Time Calculation (min) (ACUTE ONLY) 9 min    Pt received in bed agreeable to mobility session. SV for bed mobility. MinA for STS. MinA for ambulation w/o AD, heavy L lateral lean during ambulation needing minA to correct and pt occasionally using handrails or furniture surfing in room for stability. Returned to room without fault. No complaints. Left in chair with all needs met, call bell in reach.    Deloria Fetch Mobility Specialist Please contact via SecureChat or Rehab office at [phone]

## 2024-01-20 NOTE — Plan of Care (Signed)
  Problem: Education: Goal: Ability to describe self-care measures that may prevent or decrease complications (Diabetes Survival Skills Education) will improve Outcome: Progressing Goal: Individualized Educational Video(s) Outcome: Progressing   Problem: Coping: Goal: Ability to adjust to condition or change in health will improve Outcome: Progressing   Problem: Fluid Volume: Goal: Ability to maintain a balanced intake and output will improve Outcome: Progressing   Problem: Health Behavior/Discharge Planning: Goal: Ability to identify and utilize available resources and services will improve Outcome: Progressing Goal: Ability to manage health-related needs will improve Outcome: Progressing   Problem: Metabolic: Goal: Ability to maintain appropriate glucose levels will improve Outcome: Progressing   Problem: Nutritional: Goal: Maintenance of adequate nutrition will improve Outcome: Progressing Goal: Progress toward achieving an optimal weight will improve Outcome: Progressing   Problem: Skin Integrity: Goal: Risk for impaired skin integrity will decrease Outcome: Progressing   Problem: Tissue Perfusion: Goal: Adequacy of tissue perfusion will improve Outcome: Progressing   Problem: Activity: Goal: Ability to tolerate increased activity will improve Outcome: Progressing   Problem: Respiratory: Goal: Ability to maintain a clear airway and adequate ventilation will improve Outcome: Progressing   Problem: Role Relationship: Goal: Method of communication will improve Outcome: Progressing   Problem: Education: Goal: Knowledge of General Education information will improve Description: Including pain rating scale, medication(s)/side effects and non-pharmacologic comfort measures Outcome: Progressing   Problem: Health Behavior/Discharge Planning: Goal: Ability to manage health-related needs will improve Outcome: Progressing   Problem: Clinical Measurements: Goal:  Ability to maintain clinical measurements within normal limits will improve Outcome: Progressing Goal: Will remain free from infection Outcome: Progressing Goal: Diagnostic test results will improve Outcome: Progressing Goal: Respiratory complications will improve Outcome: Progressing Goal: Cardiovascular complication will be avoided Outcome: Progressing   Problem: Activity: Goal: Risk for activity intolerance will decrease Outcome: Progressing   Problem: Nutrition: Goal: Adequate nutrition will be maintained Outcome: Progressing   Problem: Coping: Goal: Level of anxiety will decrease Outcome: Progressing   Problem: Elimination: Goal: Will not experience complications related to bowel motility Outcome: Progressing Goal: Will not experience complications related to urinary retention Outcome: Progressing   Problem: Pain Managment: Goal: General experience of comfort will improve and/or be controlled Outcome: Progressing   Problem: Safety: Goal: Ability to remain free from injury will improve Outcome: Progressing   Problem: Skin Integrity: Goal: Risk for impaired skin integrity will decrease Outcome: Progressing   Problem: Safety: Goal: Non-violent Restraint(s) Outcome: Progressing

## 2024-01-22 ENCOUNTER — Telehealth: Payer: Self-pay

## 2024-01-22 DIAGNOSIS — I10 Essential (primary) hypertension: Secondary | ICD-10-CM | POA: Diagnosis not present

## 2024-01-22 DIAGNOSIS — I160 Hypertensive urgency: Secondary | ICD-10-CM | POA: Diagnosis not present

## 2024-01-22 DIAGNOSIS — Z7982 Long term (current) use of aspirin: Secondary | ICD-10-CM | POA: Diagnosis not present

## 2024-01-22 DIAGNOSIS — G9340 Encephalopathy, unspecified: Secondary | ICD-10-CM | POA: Diagnosis not present

## 2024-01-22 DIAGNOSIS — K219 Gastro-esophageal reflux disease without esophagitis: Secondary | ICD-10-CM | POA: Diagnosis not present

## 2024-01-22 DIAGNOSIS — G40901 Epilepsy, unspecified, not intractable, with status epilepticus: Secondary | ICD-10-CM | POA: Diagnosis not present

## 2024-01-22 DIAGNOSIS — I69351 Hemiplegia and hemiparesis following cerebral infarction affecting right dominant side: Secondary | ICD-10-CM | POA: Diagnosis not present

## 2024-01-22 DIAGNOSIS — J9600 Acute respiratory failure, unspecified whether with hypoxia or hypercapnia: Secondary | ICD-10-CM | POA: Diagnosis not present

## 2024-01-22 DIAGNOSIS — I69354 Hemiplegia and hemiparesis following cerebral infarction affecting left non-dominant side: Secondary | ICD-10-CM | POA: Diagnosis not present

## 2024-01-22 NOTE — Telephone Encounter (Signed)
 Soni from [facility] home care called wanting verbal orders for Physical therapy for once a week for 7 weeks. Verbal order given. [phone]

## 2024-01-23 DIAGNOSIS — I160 Hypertensive urgency: Secondary | ICD-10-CM | POA: Diagnosis not present

## 2024-01-23 DIAGNOSIS — I69351 Hemiplegia and hemiparesis following cerebral infarction affecting right dominant side: Secondary | ICD-10-CM | POA: Diagnosis not present

## 2024-01-23 DIAGNOSIS — G9340 Encephalopathy, unspecified: Secondary | ICD-10-CM | POA: Diagnosis not present

## 2024-01-23 DIAGNOSIS — G40901 Epilepsy, unspecified, not intractable, with status epilepticus: Secondary | ICD-10-CM | POA: Diagnosis not present

## 2024-01-23 DIAGNOSIS — I69354 Hemiplegia and hemiparesis following cerebral infarction affecting left non-dominant side: Secondary | ICD-10-CM | POA: Diagnosis not present

## 2024-01-23 DIAGNOSIS — Z7982 Long term (current) use of aspirin: Secondary | ICD-10-CM | POA: Diagnosis not present

## 2024-01-23 DIAGNOSIS — J9600 Acute respiratory failure, unspecified whether with hypoxia or hypercapnia: Secondary | ICD-10-CM | POA: Diagnosis not present

## 2024-01-23 DIAGNOSIS — K219 Gastro-esophageal reflux disease without esophagitis: Secondary | ICD-10-CM | POA: Diagnosis not present

## 2024-01-23 DIAGNOSIS — I10 Essential (primary) hypertension: Secondary | ICD-10-CM | POA: Diagnosis not present

## 2024-01-26 ENCOUNTER — Telehealth: Payer: Self-pay | Admitting: Nurse Practitioner

## 2024-01-26 ENCOUNTER — Telehealth: Payer: Self-pay

## 2024-01-26 NOTE — Telephone Encounter (Signed)
 Received 01/22/24 recert PT/OT order from [facility]. Gave to [name] for signature-Toni

## 2024-01-26 NOTE — Telephone Encounter (Signed)
 Gave verbal order to occupational therapy to [facility] 1 times a week for 4 week

## 2024-01-27 ENCOUNTER — Telehealth: Payer: Self-pay | Admitting: Nurse Practitioner

## 2024-01-27 ENCOUNTER — Encounter: Payer: Self-pay | Admitting: Nurse Practitioner

## 2024-01-27 ENCOUNTER — Ambulatory Visit (INDEPENDENT_AMBULATORY_CARE_PROVIDER_SITE_OTHER): Admitting: Nurse Practitioner

## 2024-01-27 VITALS — BP 134/72 | HR 82 | Temp 98.4°F | Resp 16 | Ht 70.0 in | Wt 170.4 lb

## 2024-01-27 DIAGNOSIS — I6930 Unspecified sequelae of cerebral infarction: Secondary | ICD-10-CM

## 2024-01-27 DIAGNOSIS — R7303 Prediabetes: Secondary | ICD-10-CM | POA: Diagnosis not present

## 2024-01-27 DIAGNOSIS — E782 Mixed hyperlipidemia: Secondary | ICD-10-CM

## 2024-01-27 DIAGNOSIS — Z09 Encounter for follow-up examination after completed treatment for conditions other than malignant neoplasm: Secondary | ICD-10-CM

## 2024-01-27 DIAGNOSIS — E559 Vitamin D deficiency, unspecified: Secondary | ICD-10-CM

## 2024-01-27 DIAGNOSIS — K219 Gastro-esophageal reflux disease without esophagitis: Secondary | ICD-10-CM

## 2024-01-27 DIAGNOSIS — I160 Hypertensive urgency: Secondary | ICD-10-CM | POA: Diagnosis not present

## 2024-01-27 DIAGNOSIS — Z7982 Long term (current) use of aspirin: Secondary | ICD-10-CM | POA: Diagnosis not present

## 2024-01-27 DIAGNOSIS — I10 Essential (primary) hypertension: Secondary | ICD-10-CM

## 2024-01-27 DIAGNOSIS — R569 Unspecified convulsions: Secondary | ICD-10-CM | POA: Diagnosis not present

## 2024-01-27 DIAGNOSIS — J9600 Acute respiratory failure, unspecified whether with hypoxia or hypercapnia: Secondary | ICD-10-CM | POA: Diagnosis not present

## 2024-01-27 DIAGNOSIS — G9340 Encephalopathy, unspecified: Secondary | ICD-10-CM | POA: Diagnosis not present

## 2024-01-27 DIAGNOSIS — I69351 Hemiplegia and hemiparesis following cerebral infarction affecting right dominant side: Secondary | ICD-10-CM | POA: Diagnosis not present

## 2024-01-27 DIAGNOSIS — I69354 Hemiplegia and hemiparesis following cerebral infarction affecting left non-dominant side: Secondary | ICD-10-CM | POA: Diagnosis not present

## 2024-01-27 DIAGNOSIS — G40901 Epilepsy, unspecified, not intractable, with status epilepticus: Secondary | ICD-10-CM | POA: Diagnosis not present

## 2024-01-27 DIAGNOSIS — G4701 Insomnia due to medical condition: Secondary | ICD-10-CM | POA: Diagnosis not present

## 2024-01-27 MED ORDER — LISINOPRIL 20 MG PO TABS: 20.0000 mg | ORAL_TABLET | Freq: Every day | ORAL | 3 refills | Status: AC

## 2024-01-27 MED ORDER — HYDROCHLOROTHIAZIDE 12.5 MG PO TABS: 12.5000 mg | ORAL_TABLET | Freq: Every day | ORAL | 3 refills | Status: AC

## 2024-01-27 MED ORDER — AMLODIPINE BESYLATE 10 MG PO TABS: 10.0000 mg | ORAL_TABLET | Freq: Every day | ORAL | 3 refills | Status: AC

## 2024-01-27 MED ORDER — DIVALPROEX SODIUM 250 MG PO DR TAB: DELAYED_RELEASE_TABLET | ORAL | 1 refills | Status: DC

## 2024-01-27 MED ORDER — LOVASTATIN 10 MG PO TABS
10.0000 mg | ORAL_TABLET | Freq: Every day | ORAL | 1 refills | Status: DC
Start: 2024-01-27 — End: 2024-07-22

## 2024-01-27 MED ORDER — OMEPRAZOLE MAGNESIUM 20 MG PO TBEC: 20.0000 mg | DELAYED_RELEASE_TABLET | Freq: Every day | ORAL | 1 refills | Status: DC
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
## 2024-01-27 NOTE — Telephone Encounter (Signed)
 01/22/24 recert PT/OT order signed. Faxed back to [facility]; [phone]. Scanned-Toni

## 2024-01-28 ENCOUNTER — Telehealth: Payer: Self-pay | Admitting: Nurse Practitioner

## 2024-01-28 DIAGNOSIS — I69354 Hemiplegia and hemiparesis following cerebral infarction affecting left non-dominant side: Secondary | ICD-10-CM | POA: Diagnosis not present

## 2024-01-28 DIAGNOSIS — K219 Gastro-esophageal reflux disease without esophagitis: Secondary | ICD-10-CM | POA: Diagnosis not present

## 2024-01-28 DIAGNOSIS — G40901 Epilepsy, unspecified, not intractable, with status epilepticus: Secondary | ICD-10-CM | POA: Diagnosis not present

## 2024-01-28 DIAGNOSIS — Z7982 Long term (current) use of aspirin: Secondary | ICD-10-CM | POA: Diagnosis not present

## 2024-01-28 DIAGNOSIS — I10 Essential (primary) hypertension: Secondary | ICD-10-CM | POA: Diagnosis not present

## 2024-01-28 DIAGNOSIS — I69351 Hemiplegia and hemiparesis following cerebral infarction affecting right dominant side: Secondary | ICD-10-CM | POA: Diagnosis not present

## 2024-01-28 DIAGNOSIS — J9600 Acute respiratory failure, unspecified whether with hypoxia or hypercapnia: Secondary | ICD-10-CM | POA: Diagnosis not present

## 2024-01-28 DIAGNOSIS — G9340 Encephalopathy, unspecified: Secondary | ICD-10-CM | POA: Diagnosis not present

## 2024-01-28 DIAGNOSIS — I160 Hypertensive urgency: Secondary | ICD-10-CM | POA: Diagnosis not present

## 2024-01-28 NOTE — Telephone Encounter (Signed)
 Received 01/22/2024 PT/OT order from [facility]. Gave to [name] for signatures-Toni

## 2024-01-29 ENCOUNTER — Encounter: Payer: Self-pay | Admitting: Nurse Practitioner

## 2024-01-29 ENCOUNTER — Telehealth: Payer: Self-pay | Admitting: Nurse Practitioner

## 2024-01-29 ENCOUNTER — Telehealth: Payer: Self-pay

## 2024-01-29 DIAGNOSIS — I6930 Unspecified sequelae of cerebral infarction: Secondary | ICD-10-CM | POA: Insufficient documentation

## 2024-01-29 DIAGNOSIS — K219 Gastro-esophageal reflux disease without esophagitis: Secondary | ICD-10-CM | POA: Insufficient documentation

## 2024-01-29 DIAGNOSIS — E782 Mixed hyperlipidemia: Secondary | ICD-10-CM | POA: Insufficient documentation

## 2024-01-29 MED ORDER — TRAZODONE HCL 50 MG PO TABS: 50.0000 mg | ORAL_TABLET | Freq: Every day | ORAL | 1 refills | Status: DC

## 2024-01-29 NOTE — Telephone Encounter (Signed)
 01/22/2024 PT/OT order signed. Faxed back to [facility]; [phone]. Scanned-Toni

## 2024-01-29 NOTE — Telephone Encounter (Signed)
 Tanya Fantasia from [facility] called [phone] that he cannot sleep at night pt forgot to ask alyssa at visit as per alyssa advised that we send trazodone  and asper bonnie pt try before OTC not helping

## 2024-02-04 ENCOUNTER — Telehealth: Payer: Self-pay | Admitting: Nurse Practitioner

## 2024-02-04 ENCOUNTER — Telehealth: Payer: Self-pay

## 2024-02-04 DIAGNOSIS — I160 Hypertensive urgency: Secondary | ICD-10-CM | POA: Diagnosis not present

## 2024-02-04 DIAGNOSIS — Z7982 Long term (current) use of aspirin: Secondary | ICD-10-CM | POA: Diagnosis not present

## 2024-02-04 DIAGNOSIS — J9600 Acute respiratory failure, unspecified whether with hypoxia or hypercapnia: Secondary | ICD-10-CM | POA: Diagnosis not present

## 2024-02-04 DIAGNOSIS — I69351 Hemiplegia and hemiparesis following cerebral infarction affecting right dominant side: Secondary | ICD-10-CM | POA: Diagnosis not present

## 2024-02-04 DIAGNOSIS — G9340 Encephalopathy, unspecified: Secondary | ICD-10-CM | POA: Diagnosis not present

## 2024-02-04 DIAGNOSIS — I10 Essential (primary) hypertension: Secondary | ICD-10-CM | POA: Diagnosis not present

## 2024-02-04 DIAGNOSIS — G40901 Epilepsy, unspecified, not intractable, with status epilepticus: Secondary | ICD-10-CM | POA: Diagnosis not present

## 2024-02-04 DIAGNOSIS — K219 Gastro-esophageal reflux disease without esophagitis: Secondary | ICD-10-CM | POA: Diagnosis not present

## 2024-02-04 DIAGNOSIS — I69354 Hemiplegia and hemiparesis following cerebral infarction affecting left non-dominant side: Secondary | ICD-10-CM | POA: Diagnosis not present

## 2024-02-04 NOTE — Telephone Encounter (Signed)
 Urgent Neurology referral sent via [facility] to [facility].  Notified patient' wife. Gave pt telephone# [phone]-Toni

## 2024-02-05 DIAGNOSIS — G9340 Encephalopathy, unspecified: Secondary | ICD-10-CM | POA: Diagnosis not present

## 2024-02-05 DIAGNOSIS — J9600 Acute respiratory failure, unspecified whether with hypoxia or hypercapnia: Secondary | ICD-10-CM | POA: Diagnosis not present

## 2024-02-05 DIAGNOSIS — I160 Hypertensive urgency: Secondary | ICD-10-CM | POA: Diagnosis not present

## 2024-02-05 DIAGNOSIS — K219 Gastro-esophageal reflux disease without esophagitis: Secondary | ICD-10-CM | POA: Diagnosis not present

## 2024-02-05 DIAGNOSIS — I69354 Hemiplegia and hemiparesis following cerebral infarction affecting left non-dominant side: Secondary | ICD-10-CM | POA: Diagnosis not present

## 2024-02-05 DIAGNOSIS — I10 Essential (primary) hypertension: Secondary | ICD-10-CM | POA: Diagnosis not present

## 2024-02-05 DIAGNOSIS — G40901 Epilepsy, unspecified, not intractable, with status epilepticus: Secondary | ICD-10-CM | POA: Diagnosis not present

## 2024-02-05 DIAGNOSIS — I69351 Hemiplegia and hemiparesis following cerebral infarction affecting right dominant side: Secondary | ICD-10-CM | POA: Diagnosis not present

## 2024-02-05 DIAGNOSIS — Z7982 Long term (current) use of aspirin: Secondary | ICD-10-CM | POA: Diagnosis not present

## 2024-02-05 MED ORDER — MELOXICAM 15 MG PO TABS: 15.0000 mg | ORAL_TABLET | Freq: Every day | ORAL | 1 refills | Status: DC

## 2024-02-05 NOTE — Telephone Encounter (Signed)
 [facility] nurse advised that we [facility]

## 2024-02-09 DIAGNOSIS — J9600 Acute respiratory failure, unspecified whether with hypoxia or hypercapnia: Secondary | ICD-10-CM | POA: Diagnosis not present

## 2024-02-09 DIAGNOSIS — I160 Hypertensive urgency: Secondary | ICD-10-CM | POA: Diagnosis not present

## 2024-02-09 DIAGNOSIS — G40901 Epilepsy, unspecified, not intractable, with status epilepticus: Secondary | ICD-10-CM | POA: Diagnosis not present

## 2024-02-09 DIAGNOSIS — Z7982 Long term (current) use of aspirin: Secondary | ICD-10-CM | POA: Diagnosis not present

## 2024-02-09 DIAGNOSIS — K219 Gastro-esophageal reflux disease without esophagitis: Secondary | ICD-10-CM | POA: Diagnosis not present

## 2024-02-09 DIAGNOSIS — I69354 Hemiplegia and hemiparesis following cerebral infarction affecting left non-dominant side: Secondary | ICD-10-CM | POA: Diagnosis not present

## 2024-02-09 DIAGNOSIS — I69351 Hemiplegia and hemiparesis following cerebral infarction affecting right dominant side: Secondary | ICD-10-CM | POA: Diagnosis not present

## 2024-02-09 DIAGNOSIS — I10 Essential (primary) hypertension: Secondary | ICD-10-CM | POA: Diagnosis not present

## 2024-02-09 DIAGNOSIS — G9340 Encephalopathy, unspecified: Secondary | ICD-10-CM | POA: Diagnosis not present

## 2024-02-10 DIAGNOSIS — G9340 Encephalopathy, unspecified: Secondary | ICD-10-CM | POA: Diagnosis not present

## 2024-02-10 DIAGNOSIS — I69354 Hemiplegia and hemiparesis following cerebral infarction affecting left non-dominant side: Secondary | ICD-10-CM | POA: Diagnosis not present

## 2024-02-10 DIAGNOSIS — G40901 Epilepsy, unspecified, not intractable, with status epilepticus: Secondary | ICD-10-CM | POA: Diagnosis not present

## 2024-02-10 DIAGNOSIS — Z7982 Long term (current) use of aspirin: Secondary | ICD-10-CM | POA: Diagnosis not present

## 2024-02-10 DIAGNOSIS — J9600 Acute respiratory failure, unspecified whether with hypoxia or hypercapnia: Secondary | ICD-10-CM | POA: Diagnosis not present

## 2024-02-10 DIAGNOSIS — K219 Gastro-esophageal reflux disease without esophagitis: Secondary | ICD-10-CM | POA: Diagnosis not present

## 2024-02-10 DIAGNOSIS — I10 Essential (primary) hypertension: Secondary | ICD-10-CM | POA: Diagnosis not present

## 2024-02-10 DIAGNOSIS — I160 Hypertensive urgency: Secondary | ICD-10-CM | POA: Diagnosis not present

## 2024-02-10 DIAGNOSIS — I69351 Hemiplegia and hemiparesis following cerebral infarction affecting right dominant side: Secondary | ICD-10-CM | POA: Diagnosis not present

## 2024-02-17 DIAGNOSIS — J9600 Acute respiratory failure, unspecified whether with hypoxia or hypercapnia: Secondary | ICD-10-CM | POA: Diagnosis not present

## 2024-02-17 DIAGNOSIS — I69354 Hemiplegia and hemiparesis following cerebral infarction affecting left non-dominant side: Secondary | ICD-10-CM | POA: Diagnosis not present

## 2024-02-17 DIAGNOSIS — Z7982 Long term (current) use of aspirin: Secondary | ICD-10-CM | POA: Diagnosis not present

## 2024-02-17 DIAGNOSIS — I69351 Hemiplegia and hemiparesis following cerebral infarction affecting right dominant side: Secondary | ICD-10-CM | POA: Diagnosis not present

## 2024-02-17 DIAGNOSIS — G9340 Encephalopathy, unspecified: Secondary | ICD-10-CM | POA: Diagnosis not present

## 2024-02-17 DIAGNOSIS — G40901 Epilepsy, unspecified, not intractable, with status epilepticus: Secondary | ICD-10-CM | POA: Diagnosis not present

## 2024-02-17 DIAGNOSIS — K219 Gastro-esophageal reflux disease without esophagitis: Secondary | ICD-10-CM | POA: Diagnosis not present

## 2024-02-17 DIAGNOSIS — I160 Hypertensive urgency: Secondary | ICD-10-CM | POA: Diagnosis not present

## 2024-02-17 DIAGNOSIS — I10 Essential (primary) hypertension: Secondary | ICD-10-CM | POA: Diagnosis not present

## 2024-02-24 ENCOUNTER — Encounter: Payer: Self-pay | Admitting: Nurse Practitioner

## 2024-02-24 ENCOUNTER — Telehealth: Payer: Self-pay | Admitting: Nurse Practitioner

## 2024-02-24 ENCOUNTER — Other Ambulatory Visit: Payer: Self-pay

## 2024-02-24 MED ORDER — BENZONATATE 100 MG PO CAPS: 100.0000 mg | ORAL_CAPSULE | Freq: Two times a day (BID) | ORAL | 1 refills | Status: DC | PRN

## 2024-02-24 NOTE — Telephone Encounter (Signed)
 As per alyssa sent Benzonatate to [facility] and pt aware

## 2024-02-24 NOTE — Telephone Encounter (Signed)
 Neurology appointment 02/24/2024 @ [facility]-Toni

## 2024-03-16 ENCOUNTER — Telehealth: Payer: Self-pay | Admitting: Nurse Practitioner

## 2024-03-16 NOTE — Telephone Encounter (Signed)
 Left vm and sent mychart message to confirm 03/23/24 appointment-Toni

## 2024-03-21 ENCOUNTER — Other Ambulatory Visit: Payer: Self-pay | Admitting: Nurse Practitioner

## 2024-03-21 DIAGNOSIS — R569 Unspecified convulsions: Secondary | ICD-10-CM

## 2024-03-22 NOTE — Telephone Encounter (Signed)
 Please review

## 2024-03-23 ENCOUNTER — Encounter: Payer: Self-pay | Admitting: Nurse Practitioner

## 2024-03-23 ENCOUNTER — Ambulatory Visit (INDEPENDENT_AMBULATORY_CARE_PROVIDER_SITE_OTHER): Admitting: Nurse Practitioner

## 2024-03-23 VITALS — BP 116/60 | HR 89 | Temp 98.4°F | Resp 16 | Ht 70.0 in | Wt 166.8 lb

## 2024-03-23 DIAGNOSIS — I69354 Hemiplegia and hemiparesis following cerebral infarction affecting left non-dominant side: Secondary | ICD-10-CM

## 2024-03-23 DIAGNOSIS — Z0000 Encounter for general adult medical examination without abnormal findings: Secondary | ICD-10-CM

## 2024-03-23 DIAGNOSIS — I10 Essential (primary) hypertension: Secondary | ICD-10-CM | POA: Diagnosis not present

## 2024-03-23 DIAGNOSIS — E782 Mixed hyperlipidemia: Secondary | ICD-10-CM | POA: Diagnosis not present

## 2024-03-23 DIAGNOSIS — R7303 Prediabetes: Secondary | ICD-10-CM

## 2024-03-23 DIAGNOSIS — Z125 Encounter for screening for malignant neoplasm of prostate: Secondary | ICD-10-CM

## 2024-03-23 DIAGNOSIS — I6930 Unspecified sequelae of cerebral infarction: Secondary | ICD-10-CM

## 2024-03-23 DIAGNOSIS — W57XXXA Bitten or stung by nonvenomous insect and other nonvenomous arthropods, initial encounter: Secondary | ICD-10-CM

## 2024-03-23 DIAGNOSIS — Z1211 Encounter for screening for malignant neoplasm of colon: Secondary | ICD-10-CM | POA: Diagnosis not present

## 2024-03-23 DIAGNOSIS — Z1212 Encounter for screening for malignant neoplasm of rectum: Secondary | ICD-10-CM

## 2024-03-23 MED ORDER — TRIAMCINOLONE ACETONIDE 0.1 % EX CREA: 1.0000 | TOPICAL_CREAM | Freq: Two times a day (BID) | CUTANEOUS | 4 refills | Status: AC | PRN
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
## 2024-03-24 ENCOUNTER — Encounter: Payer: Self-pay | Admitting: Nurse Practitioner

## 2024-03-24 DIAGNOSIS — Z1331 Encounter for screening for depression: Secondary | ICD-10-CM | POA: Diagnosis not present

## 2024-03-24 DIAGNOSIS — G4733 Obstructive sleep apnea (adult) (pediatric): Secondary | ICD-10-CM | POA: Diagnosis not present

## 2024-03-24 DIAGNOSIS — R531 Weakness: Secondary | ICD-10-CM | POA: Diagnosis not present

## 2024-03-24 DIAGNOSIS — I10 Essential (primary) hypertension: Secondary | ICD-10-CM | POA: Diagnosis not present

## 2024-03-24 DIAGNOSIS — R569 Unspecified convulsions: Secondary | ICD-10-CM | POA: Diagnosis not present

## 2024-03-24 DIAGNOSIS — Z8673 Personal history of transient ischemic attack (TIA), and cerebral infarction without residual deficits: Secondary | ICD-10-CM | POA: Diagnosis not present

## 2024-03-29 ENCOUNTER — Ambulatory Visit: Attending: Nurse Practitioner | Admitting: Occupational Therapy

## 2024-03-29 DIAGNOSIS — I69354 Hemiplegia and hemiparesis following cerebral infarction affecting left non-dominant side: Secondary | ICD-10-CM | POA: Insufficient documentation

## 2024-03-29 DIAGNOSIS — M6281 Muscle weakness (generalized): Secondary | ICD-10-CM | POA: Diagnosis not present

## 2024-03-29 DIAGNOSIS — I6930 Unspecified sequelae of cerebral infarction: Secondary | ICD-10-CM | POA: Insufficient documentation

## 2024-03-29 DIAGNOSIS — R278 Other lack of coordination: Secondary | ICD-10-CM | POA: Diagnosis not present
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
## 2024-04-03 ENCOUNTER — Encounter (HOSPITAL_COMMUNITY): Payer: Self-pay | Admitting: Interventional Radiology

## 2024-04-05 ENCOUNTER — Ambulatory Visit: Admitting: Occupational Therapy

## 2024-04-05 DIAGNOSIS — I6930 Unspecified sequelae of cerebral infarction: Secondary | ICD-10-CM | POA: Diagnosis not present

## 2024-04-05 DIAGNOSIS — R278 Other lack of coordination: Secondary | ICD-10-CM

## 2024-04-05 DIAGNOSIS — M6281 Muscle weakness (generalized): Secondary | ICD-10-CM | POA: Diagnosis not present

## 2024-04-05 DIAGNOSIS — I69354 Hemiplegia and hemiparesis following cerebral infarction affecting left non-dominant side: Secondary | ICD-10-CM | POA: Diagnosis not present
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
## 2024-04-12 ENCOUNTER — Ambulatory Visit: Attending: Nurse Practitioner | Admitting: Occupational Therapy

## 2024-04-12 DIAGNOSIS — R278 Other lack of coordination: Secondary | ICD-10-CM | POA: Insufficient documentation

## 2024-04-12 DIAGNOSIS — M6281 Muscle weakness (generalized): Secondary | ICD-10-CM | POA: Diagnosis not present
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
## 2024-04-19 ENCOUNTER — Ambulatory Visit: Admitting: Occupational Therapy

## 2024-04-19 DIAGNOSIS — R278 Other lack of coordination: Secondary | ICD-10-CM | POA: Diagnosis not present

## 2024-04-19 DIAGNOSIS — M6281 Muscle weakness (generalized): Secondary | ICD-10-CM | POA: Diagnosis not present
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
## 2024-04-26 ENCOUNTER — Ambulatory Visit: Admitting: Occupational Therapy

## 2024-04-26 DIAGNOSIS — M6281 Muscle weakness (generalized): Secondary | ICD-10-CM

## 2024-04-26 DIAGNOSIS — R278 Other lack of coordination: Secondary | ICD-10-CM | POA: Diagnosis not present
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
## 2024-05-03 ENCOUNTER — Ambulatory Visit: Admitting: Occupational Therapy

## 2024-05-03 DIAGNOSIS — M6281 Muscle weakness (generalized): Secondary | ICD-10-CM | POA: Diagnosis not present

## 2024-05-03 DIAGNOSIS — R278 Other lack of coordination: Secondary | ICD-10-CM
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
## 2024-05-10 ENCOUNTER — Ambulatory Visit: Attending: Nurse Practitioner | Admitting: Occupational Therapy

## 2024-05-10 DIAGNOSIS — M6281 Muscle weakness (generalized): Secondary | ICD-10-CM | POA: Insufficient documentation

## 2024-05-10 DIAGNOSIS — I63511 Cerebral infarction due to unspecified occlusion or stenosis of right middle cerebral artery: Secondary | ICD-10-CM | POA: Insufficient documentation

## 2024-05-10 DIAGNOSIS — R278 Other lack of coordination: Secondary | ICD-10-CM | POA: Diagnosis not present
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
## 2024-05-17 ENCOUNTER — Ambulatory Visit

## 2024-05-17 DIAGNOSIS — R278 Other lack of coordination: Secondary | ICD-10-CM | POA: Diagnosis not present

## 2024-05-17 DIAGNOSIS — M6281 Muscle weakness (generalized): Secondary | ICD-10-CM | POA: Diagnosis not present

## 2024-05-17 DIAGNOSIS — I63511 Cerebral infarction due to unspecified occlusion or stenosis of right middle cerebral artery: Secondary | ICD-10-CM
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
## 2024-05-24 ENCOUNTER — Ambulatory Visit

## 2024-05-24 DIAGNOSIS — M6281 Muscle weakness (generalized): Secondary | ICD-10-CM | POA: Diagnosis not present

## 2024-05-24 DIAGNOSIS — R278 Other lack of coordination: Secondary | ICD-10-CM | POA: Diagnosis not present

## 2024-05-24 DIAGNOSIS — I63511 Cerebral infarction due to unspecified occlusion or stenosis of right middle cerebral artery: Secondary | ICD-10-CM | POA: Diagnosis not present
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
## 2024-05-31 ENCOUNTER — Ambulatory Visit: Admitting: Occupational Therapy

## 2024-05-31 DIAGNOSIS — M6281 Muscle weakness (generalized): Secondary | ICD-10-CM | POA: Diagnosis not present

## 2024-05-31 DIAGNOSIS — R278 Other lack of coordination: Secondary | ICD-10-CM

## 2024-05-31 DIAGNOSIS — I63511 Cerebral infarction due to unspecified occlusion or stenosis of right middle cerebral artery: Secondary | ICD-10-CM | POA: Diagnosis not present
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
## 2024-06-08 ENCOUNTER — Ambulatory Visit: Attending: Nurse Practitioner | Admitting: Occupational Therapy

## 2024-06-08 DIAGNOSIS — R278 Other lack of coordination: Secondary | ICD-10-CM | POA: Insufficient documentation

## 2024-06-08 DIAGNOSIS — I63511 Cerebral infarction due to unspecified occlusion or stenosis of right middle cerebral artery: Secondary | ICD-10-CM | POA: Diagnosis not present

## 2024-06-08 DIAGNOSIS — M6281 Muscle weakness (generalized): Secondary | ICD-10-CM | POA: Diagnosis not present
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
## 2024-06-14 ENCOUNTER — Ambulatory Visit: Admitting: Occupational Therapy

## 2024-06-14 DIAGNOSIS — R278 Other lack of coordination: Secondary | ICD-10-CM

## 2024-06-14 DIAGNOSIS — M6281 Muscle weakness (generalized): Secondary | ICD-10-CM | POA: Diagnosis not present

## 2024-06-14 DIAGNOSIS — I63511 Cerebral infarction due to unspecified occlusion or stenosis of right middle cerebral artery: Secondary | ICD-10-CM | POA: Diagnosis not present
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
## 2024-06-20 ENCOUNTER — Other Ambulatory Visit: Payer: Self-pay | Admitting: Nurse Practitioner

## 2024-06-20 DIAGNOSIS — R569 Unspecified convulsions: Secondary | ICD-10-CM

## 2024-06-21 ENCOUNTER — Ambulatory Visit: Admitting: Occupational Therapy

## 2024-06-21 DIAGNOSIS — M6281 Muscle weakness (generalized): Secondary | ICD-10-CM | POA: Diagnosis not present

## 2024-06-21 DIAGNOSIS — R278 Other lack of coordination: Secondary | ICD-10-CM

## 2024-06-21 DIAGNOSIS — I63511 Cerebral infarction due to unspecified occlusion or stenosis of right middle cerebral artery: Secondary | ICD-10-CM | POA: Diagnosis not present
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
## 2024-06-22 DIAGNOSIS — Z1211 Encounter for screening for malignant neoplasm of colon: Secondary | ICD-10-CM | POA: Diagnosis not present

## 2024-06-22 DIAGNOSIS — Z1212 Encounter for screening for malignant neoplasm of rectum: Secondary | ICD-10-CM | POA: Diagnosis not present

## 2024-06-28 ENCOUNTER — Ambulatory Visit: Admitting: Occupational Therapy

## 2024-06-28 DIAGNOSIS — I63511 Cerebral infarction due to unspecified occlusion or stenosis of right middle cerebral artery: Secondary | ICD-10-CM | POA: Diagnosis not present

## 2024-06-28 DIAGNOSIS — R278 Other lack of coordination: Secondary | ICD-10-CM | POA: Diagnosis not present

## 2024-06-28 DIAGNOSIS — M6281 Muscle weakness (generalized): Secondary | ICD-10-CM | POA: Diagnosis not present
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
## 2024-06-29 LAB — COLOGUARD: COLOGUARD: NEGATIVE

## 2024-07-05 ENCOUNTER — Ambulatory Visit

## 2024-07-05 DIAGNOSIS — I63511 Cerebral infarction due to unspecified occlusion or stenosis of right middle cerebral artery: Secondary | ICD-10-CM

## 2024-07-05 DIAGNOSIS — R278 Other lack of coordination: Secondary | ICD-10-CM

## 2024-07-05 DIAGNOSIS — M6281 Muscle weakness (generalized): Secondary | ICD-10-CM
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
## 2024-07-12 ENCOUNTER — Ambulatory Visit: Attending: Nurse Practitioner | Admitting: Occupational Therapy

## 2024-07-12 DIAGNOSIS — M6281 Muscle weakness (generalized): Secondary | ICD-10-CM | POA: Insufficient documentation
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
## 2024-07-13 NOTE — Progress Notes (Signed)
 Eren Ryser Balbach                                          MRN: [id number]   07/13/2024   The [facility] Quality Team Specialist reviewed this patient medical record for the purposes of chart review for care gap closure. The following were reviewed: abstraction for care gap closure-controlling blood pressure.    [facility] Quality Team

## 2024-07-19 ENCOUNTER — Ambulatory Visit: Admitting: Occupational Therapy

## 2024-07-19 DIAGNOSIS — M6281 Muscle weakness (generalized): Secondary | ICD-10-CM | POA: Diagnosis not present
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
## 2024-07-22 ENCOUNTER — Other Ambulatory Visit: Payer: Self-pay | Admitting: Nurse Practitioner

## 2024-07-22 DIAGNOSIS — E782 Mixed hyperlipidemia: Secondary | ICD-10-CM

## 2024-07-22 DIAGNOSIS — K219 Gastro-esophageal reflux disease without esophagitis: Secondary | ICD-10-CM

## 2024-07-26 ENCOUNTER — Ambulatory Visit: Admitting: Occupational Therapy

## 2024-07-26 DIAGNOSIS — M6281 Muscle weakness (generalized): Secondary | ICD-10-CM
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
## 2024-08-02 ENCOUNTER — Ambulatory Visit: Admitting: Occupational Therapy

## 2024-08-02 DIAGNOSIS — M6281 Muscle weakness (generalized): Secondary | ICD-10-CM | POA: Diagnosis not present
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
## 2024-08-09 ENCOUNTER — Ambulatory Visit: Attending: Nurse Practitioner | Admitting: Occupational Therapy

## 2024-08-09 DIAGNOSIS — M6281 Muscle weakness (generalized): Secondary | ICD-10-CM | POA: Diagnosis not present
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
## 2024-08-16 ENCOUNTER — Ambulatory Visit: Admitting: Occupational Therapy

## 2024-08-16 DIAGNOSIS — M6281 Muscle weakness (generalized): Secondary | ICD-10-CM | POA: Diagnosis not present
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
## 2024-08-23 ENCOUNTER — Ambulatory Visit: Admitting: Occupational Therapy

## 2024-08-23 DIAGNOSIS — M6281 Muscle weakness (generalized): Secondary | ICD-10-CM
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
## 2024-08-30 ENCOUNTER — Ambulatory Visit: Admitting: Occupational Therapy

## 2024-08-30 DIAGNOSIS — M6281 Muscle weakness (generalized): Secondary | ICD-10-CM | POA: Diagnosis not present
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
## 2024-09-06 ENCOUNTER — Ambulatory Visit: Attending: Nurse Practitioner | Admitting: Occupational Therapy

## 2024-09-06 DIAGNOSIS — M6281 Muscle weakness (generalized): Secondary | ICD-10-CM | POA: Insufficient documentation

## 2024-09-06 DIAGNOSIS — R29810 Facial weakness: Secondary | ICD-10-CM | POA: Diagnosis present
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
## 2024-09-13 ENCOUNTER — Ambulatory Visit: Admitting: Occupational Therapy

## 2024-09-20 ENCOUNTER — Ambulatory Visit: Admitting: Occupational Therapy

## 2024-09-22 ENCOUNTER — Encounter: Payer: Self-pay | Admitting: Nurse Practitioner

## 2024-09-22 ENCOUNTER — Ambulatory Visit (INDEPENDENT_AMBULATORY_CARE_PROVIDER_SITE_OTHER): Payer: Self-pay | Admitting: Nurse Practitioner

## 2024-09-22 VITALS — BP 130/68 | HR 92 | Temp 98.0°F | Resp 16 | Ht 70.0 in | Wt 177.6 lb

## 2024-09-22 DIAGNOSIS — Z79899 Other long term (current) drug therapy: Secondary | ICD-10-CM | POA: Diagnosis not present

## 2024-09-22 DIAGNOSIS — E538 Deficiency of other specified B group vitamins: Secondary | ICD-10-CM

## 2024-09-22 DIAGNOSIS — Z125 Encounter for screening for malignant neoplasm of prostate: Secondary | ICD-10-CM | POA: Diagnosis not present

## 2024-09-22 DIAGNOSIS — R7303 Prediabetes: Secondary | ICD-10-CM

## 2024-09-22 DIAGNOSIS — G4701 Insomnia due to medical condition: Secondary | ICD-10-CM

## 2024-09-22 DIAGNOSIS — R569 Unspecified convulsions: Secondary | ICD-10-CM | POA: Diagnosis not present

## 2024-09-22 DIAGNOSIS — E559 Vitamin D deficiency, unspecified: Secondary | ICD-10-CM

## 2024-09-22 DIAGNOSIS — R051 Acute cough: Secondary | ICD-10-CM

## 2024-09-22 DIAGNOSIS — E782 Mixed hyperlipidemia: Secondary | ICD-10-CM | POA: Diagnosis not present

## 2024-09-22 DIAGNOSIS — D649 Anemia, unspecified: Secondary | ICD-10-CM

## 2024-09-22 MED ORDER — LOVASTATIN 10 MG PO TABS: ORAL_TABLET | ORAL | 1 refills | Status: AC

## 2024-09-22 MED ORDER — DIVALPROEX SODIUM 250 MG PO DR TAB: 250.0000 mg | DELAYED_RELEASE_TABLET | Freq: Two times a day (BID) | ORAL | 1 refills | Status: AC

## 2024-09-22 MED ORDER — BENZONATATE 100 MG PO CAPS: 100.0000 mg | ORAL_CAPSULE | Freq: Two times a day (BID) | ORAL | 1 refills | Status: AC | PRN
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
## 2024-09-27 ENCOUNTER — Ambulatory Visit: Admitting: Occupational Therapy

## 2024-09-27 DIAGNOSIS — R29810 Facial weakness: Secondary | ICD-10-CM

## 2024-09-27 DIAGNOSIS — M6281 Muscle weakness (generalized): Secondary | ICD-10-CM | POA: Diagnosis not present
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
## 2024-10-04 ENCOUNTER — Ambulatory Visit: Admitting: Occupational Therapy

## 2024-10-04 DIAGNOSIS — M6281 Muscle weakness (generalized): Secondary | ICD-10-CM
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
## 2024-10-06 ENCOUNTER — Encounter: Payer: Self-pay | Admitting: Nurse Practitioner

## 2024-10-11 ENCOUNTER — Ambulatory Visit: Attending: Nurse Practitioner | Admitting: Occupational Therapy

## 2024-10-11 DIAGNOSIS — M6281 Muscle weakness (generalized): Secondary | ICD-10-CM | POA: Insufficient documentation

## 2024-10-11 DIAGNOSIS — R278 Other lack of coordination: Secondary | ICD-10-CM | POA: Insufficient documentation
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
## 2024-10-18 ENCOUNTER — Encounter: Payer: Self-pay | Admitting: Occupational Therapy

## 2024-10-18 ENCOUNTER — Ambulatory Visit: Admitting: Occupational Therapy

## 2024-10-18 DIAGNOSIS — R278 Other lack of coordination: Secondary | ICD-10-CM

## 2024-10-18 DIAGNOSIS — M6281 Muscle weakness (generalized): Secondary | ICD-10-CM
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
## 2024-10-25 ENCOUNTER — Ambulatory Visit: Admitting: Occupational Therapy

## 2024-10-25 DIAGNOSIS — M6281 Muscle weakness (generalized): Secondary | ICD-10-CM
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
## 2024-11-01 ENCOUNTER — Ambulatory Visit: Admitting: Occupational Therapy

## 2024-11-08 ENCOUNTER — Ambulatory Visit: Admitting: Occupational Therapy

## 2024-11-15 ENCOUNTER — Ambulatory Visit: Admitting: Occupational Therapy

## 2024-11-22 ENCOUNTER — Ambulatory Visit: Admitting: Occupational Therapy

## 2024-11-29 ENCOUNTER — Ambulatory Visit: Admitting: Occupational Therapy

## 2024-12-06 ENCOUNTER — Ambulatory Visit: Admitting: Occupational Therapy

## 2024-12-08 ENCOUNTER — Ambulatory Visit: Admitting: Occupational Therapy

## 2024-12-13 ENCOUNTER — Ambulatory Visit: Admitting: Occupational Therapy

## 2024-12-15 ENCOUNTER — Ambulatory Visit: Admitting: Occupational Therapy

## 2024-12-20 ENCOUNTER — Ambulatory Visit: Admitting: Occupational Therapy

## 2024-12-22 ENCOUNTER — Ambulatory Visit: Admitting: Occupational Therapy

## 2024-12-27 ENCOUNTER — Ambulatory Visit: Admitting: Occupational Therapy

## 2024-12-29 ENCOUNTER — Ambulatory Visit: Admitting: Occupational Therapy

## 2025-01-03 ENCOUNTER — Ambulatory Visit: Admitting: Occupational Therapy

## 2025-01-05 ENCOUNTER — Ambulatory Visit: Admitting: Occupational Therapy

## 2025-01-12 ENCOUNTER — Ambulatory Visit: Admitting: Occupational Therapy

## 2025-01-19 ENCOUNTER — Ambulatory Visit: Admitting: Occupational Therapy

## 2025-03-24 ENCOUNTER — Ambulatory Visit: Payer: Self-pay | Admitting: Nurse Practitioner
